# Patient Record
Sex: Male | Born: 1975
Health system: Southern US, Community
[De-identification: ages and names within clinical notes are randomized; demographics above are authoritative.]

## PROBLEM LIST (undated history)

## (undated) DIAGNOSIS — Z87898 Personal history of other specified conditions: Secondary | ICD-10-CM

## (undated) DIAGNOSIS — K219 Gastro-esophageal reflux disease without esophagitis: Secondary | ICD-10-CM

## (undated) DIAGNOSIS — Z86711 Personal history of pulmonary embolism: Secondary | ICD-10-CM

## (undated) DIAGNOSIS — E785 Hyperlipidemia, unspecified: Secondary | ICD-10-CM

## (undated) DIAGNOSIS — I509 Heart failure, unspecified: Secondary | ICD-10-CM

## (undated) DIAGNOSIS — G4730 Sleep apnea, unspecified: Secondary | ICD-10-CM

## (undated) DIAGNOSIS — F419 Anxiety disorder, unspecified: Secondary | ICD-10-CM

## (undated) DIAGNOSIS — I10 Essential (primary) hypertension: Secondary | ICD-10-CM

## (undated) DIAGNOSIS — J45909 Unspecified asthma, uncomplicated: Secondary | ICD-10-CM

## (undated) DIAGNOSIS — IMO0001 Reserved for inherently not codable concepts without codable children: Secondary | ICD-10-CM

## (undated) DIAGNOSIS — D6851 Activated protein C resistance: Secondary | ICD-10-CM

## (undated) DIAGNOSIS — M1990 Unspecified osteoarthritis, unspecified site: Secondary | ICD-10-CM

## (undated) DIAGNOSIS — S83289A Other tear of lateral meniscus, current injury, unspecified knee, initial encounter: Secondary | ICD-10-CM

## (undated) HISTORY — PX: HAND SURGERY: SHX662

## (undated) HISTORY — DX: Sleep apnea, unspecified: G47.30

## (undated) HISTORY — PX: ORIF FEMUR FRACTURE: SHX2119

## (undated) HISTORY — DX: Unspecified osteoarthritis, unspecified site: M19.90

## (undated) HISTORY — DX: Anxiety disorder, unspecified: F41.9

## (undated) HISTORY — DX: Hyperlipidemia, unspecified: E78.5

---

## 1998-06-05 ENCOUNTER — Encounter: Payer: Self-pay | Admitting: Emergency Medicine

## 1998-06-05 ENCOUNTER — Emergency Department (HOSPITAL_COMMUNITY): Admission: EM | Admit: 1998-06-05 | Discharge: 1998-06-05 | Payer: Self-pay | Admitting: Emergency Medicine

## 1998-11-04 ENCOUNTER — Encounter: Payer: Self-pay | Admitting: Emergency Medicine

## 1998-11-04 ENCOUNTER — Emergency Department (HOSPITAL_COMMUNITY): Admission: EM | Admit: 1998-11-04 | Discharge: 1998-11-04 | Payer: Self-pay | Admitting: Emergency Medicine

## 1998-11-21 ENCOUNTER — Encounter: Payer: Self-pay | Admitting: Emergency Medicine

## 1998-11-21 ENCOUNTER — Inpatient Hospital Stay (HOSPITAL_COMMUNITY): Admission: EM | Admit: 1998-11-21 | Discharge: 1998-11-28 | Payer: Self-pay | Admitting: Emergency Medicine

## 1998-11-22 ENCOUNTER — Encounter: Payer: Self-pay | Admitting: Orthopedic Surgery

## 1998-11-30 ENCOUNTER — Emergency Department (HOSPITAL_COMMUNITY): Admission: EM | Admit: 1998-11-30 | Discharge: 1998-11-30 | Payer: Self-pay | Admitting: Emergency Medicine

## 1999-04-22 ENCOUNTER — Encounter: Payer: Self-pay | Admitting: Emergency Medicine

## 1999-04-22 ENCOUNTER — Emergency Department (HOSPITAL_COMMUNITY): Admission: EM | Admit: 1999-04-22 | Discharge: 1999-04-22 | Payer: Self-pay | Admitting: Emergency Medicine

## 2000-03-13 ENCOUNTER — Emergency Department (HOSPITAL_COMMUNITY): Admission: EM | Admit: 2000-03-13 | Discharge: 2000-03-13 | Payer: Self-pay | Admitting: Emergency Medicine

## 2000-03-13 ENCOUNTER — Encounter: Payer: Self-pay | Admitting: Emergency Medicine

## 2004-11-23 ENCOUNTER — Inpatient Hospital Stay (HOSPITAL_COMMUNITY): Admission: AC | Admit: 2004-11-23 | Discharge: 2004-11-27 | Payer: Self-pay

## 2004-12-19 ENCOUNTER — Emergency Department (HOSPITAL_COMMUNITY): Admission: EM | Admit: 2004-12-19 | Discharge: 2004-12-19 | Payer: Self-pay | Admitting: Emergency Medicine

## 2006-03-30 ENCOUNTER — Emergency Department: Payer: Self-pay | Admitting: Emergency Medicine

## 2009-03-25 ENCOUNTER — Emergency Department (HOSPITAL_COMMUNITY): Admission: EM | Admit: 2009-03-25 | Discharge: 2009-03-26 | Payer: Self-pay | Admitting: Emergency Medicine

## 2010-05-18 ENCOUNTER — Emergency Department (HOSPITAL_COMMUNITY): Admission: EM | Admit: 2010-05-18 | Discharge: 2010-05-18 | Payer: Self-pay | Admitting: Emergency Medicine

## 2010-10-29 LAB — GLUCOSE, CAPILLARY
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
## 2011-11-27 ENCOUNTER — Emergency Department (HOSPITAL_COMMUNITY): Payer: Self-pay

## 2011-11-27 ENCOUNTER — Encounter (HOSPITAL_COMMUNITY): Payer: Self-pay | Admitting: *Deleted

## 2011-11-27 ENCOUNTER — Inpatient Hospital Stay (HOSPITAL_COMMUNITY)
Admission: EM | Admit: 2011-11-27 | Discharge: 2011-11-29 | DRG: 176 | Disposition: A | Discharge status: Home / Self Care | Payer: Self-pay | Attending: Internal Medicine | Admitting: Internal Medicine

## 2011-11-27 DIAGNOSIS — Z720 Tobacco use: Secondary | ICD-10-CM | POA: Diagnosis present

## 2011-11-27 DIAGNOSIS — F1010 Alcohol abuse, uncomplicated: Secondary | ICD-10-CM | POA: Diagnosis present

## 2011-11-27 DIAGNOSIS — E669 Obesity, unspecified: Secondary | ICD-10-CM | POA: Diagnosis present

## 2011-11-27 DIAGNOSIS — F17200 Nicotine dependence, unspecified, uncomplicated: Secondary | ICD-10-CM | POA: Diagnosis present

## 2011-11-27 DIAGNOSIS — I2699 Other pulmonary embolism without acute cor pulmonale: Principal | ICD-10-CM | POA: Diagnosis present

## 2011-11-27 LAB — HEMOGLOBIN A1C
Hgb A1c MFr Bld: 5.7 % — ABNORMAL HIGH (ref <5.7)
Mean Plasma Glucose: 117 mg/dL — ABNORMAL HIGH (ref <117)

## 2011-11-27 LAB — HEPATIC FUNCTION PANEL
Alkaline Phosphatase: 75 U/L (ref 39–117)
Indirect Bilirubin: 0.3 mg/dL (ref 0.3–0.9)
Total Protein: 7.6 g/dL (ref 6.0–8.3)

## 2011-11-27 LAB — APTT: aPTT: 30 seconds (ref 24–37)

## 2011-11-27 LAB — POCT I-STAT, CHEM 8
BUN: 17 mg/dL (ref 6–23)
Chloride: 108 mEq/L (ref 96–112)
Creatinine, Ser: 1.5 mg/dL — ABNORMAL HIGH (ref 0.50–1.35)
Potassium: 4 mEq/L (ref 3.5–5.1)
Sodium: 141 mEq/L (ref 135–145)

## 2011-11-27 LAB — DIFFERENTIAL
Eosinophils Relative: 5 % (ref 0–5)
Lymphocytes Relative: 23 % (ref 12–46)
Monocytes Absolute: 1 10*3/uL (ref 0.1–1.0)
Monocytes Relative: 10 % (ref 3–12)
Neutro Abs: 6 10*3/uL (ref 1.7–7.7)

## 2011-11-27 LAB — CBC
HCT: 46.4 % (ref 39.0–52.0)
Hemoglobin: 16.2 g/dL (ref 13.0–17.0)
MCV: 85.6 fL (ref 78.0–100.0)
RDW: 13.1 % (ref 11.5–15.5)
WBC: 9.8 10*3/uL (ref 4.0–10.5)

## 2011-11-27 LAB — CARDIAC PANEL(CRET KIN+CKTOT+MB+TROPI)
CK, MB: 1.9 ng/mL (ref 0.3–4.0)
CK, MB: 1.6 ng/mL (ref 0.3–4.0)
Total CK: 235 U/L — ABNORMAL HIGH (ref 7–232)

## 2011-11-27 MED ORDER — LORAZEPAM 1 MG PO TABS
1.0000 mg | ORAL_TABLET | Freq: Four times a day (QID) | ORAL | Status: DC | PRN
Start: 2011-11-27 — End: 2011-11-29
  Administered 2011-11-29: 1 mg via ORAL
  Filled 2011-11-27: qty 1

## 2011-11-27 MED ORDER — HYDROMORPHONE HCL PF 1 MG/ML IJ SOLN
0.5000 mg | Freq: Once | INTRAMUSCULAR | Status: AC
  Administered 2011-11-27: 0.5 mg via INTRAVENOUS
  Filled 2011-11-27: qty 1

## 2011-11-27 MED ORDER — HYDROMORPHONE HCL PF 1 MG/ML IJ SOLN
1.0000 mg | Freq: Once | INTRAMUSCULAR | Status: AC
  Administered 2011-11-27: 1 mg via INTRAMUSCULAR
  Filled 2011-11-27: qty 1

## 2011-11-27 MED ORDER — ACETAMINOPHEN 325 MG PO TABS: 650.0000 mg | ORAL_TABLET | Freq: Four times a day (QID) | ORAL | Status: DC | PRN

## 2011-11-27 MED ORDER — WARFARIN VIDEO
Freq: Once | Status: AC
  Administered 2011-11-27: 15:00:00

## 2011-11-27 MED ORDER — ENOXAPARIN SODIUM 150 MG/ML ~~LOC~~ SOLN
150.0000 mg | Freq: Two times a day (BID) | SUBCUTANEOUS | Status: DC
Start: 2011-11-27 — End: 2011-11-29
  Administered 2011-11-27 – 2011-11-29 (×5): 150 mg via SUBCUTANEOUS
  Filled 2011-11-27 (×8): qty 1

## 2011-11-27 MED ORDER — WARFARIN - PHARMACIST DOSING INPATIENT
Freq: Every day | Status: DC
  Administered 2011-11-27 – 2011-11-28 (×2)

## 2011-11-27 MED ORDER — COUMADIN BOOK
Freq: Once | Status: AC
  Administered 2011-11-27: 15:00:00
  Filled 2011-11-27: qty 1

## 2011-11-27 MED ORDER — NICOTINE 21 MG/24HR TD PT24
21.0000 mg | MEDICATED_PATCH | Freq: Every day | TRANSDERMAL | Status: DC
  Administered 2011-11-27 – 2011-11-29 (×4): 21 mg via TRANSDERMAL
  Filled 2011-11-27 (×4): qty 1

## 2011-11-27 MED ORDER — WARFARIN SODIUM 10 MG PO TABS
10.0000 mg | ORAL_TABLET | Freq: Once | ORAL | Status: AC
  Administered 2011-11-27: 10 mg via ORAL
  Filled 2011-11-27: qty 1

## 2011-11-27 MED ORDER — SODIUM CHLORIDE 0.9 % IV SOLN
INTRAVENOUS | Status: DC
  Administered 2011-11-27: 14:00:00 via INTRAVENOUS
  Administered 2011-11-27: 125 mL/h via INTRAVENOUS
  Administered 2011-11-28: 13:00:00 via INTRAVENOUS
  Administered 2011-11-28: 125 mL/h via INTRAVENOUS
  Administered 2011-11-29: 05:00:00 via INTRAVENOUS

## 2011-11-27 MED ORDER — MORPHINE SULFATE 2 MG/ML IJ SOLN
2.0000 mg | INTRAMUSCULAR | Status: DC | PRN
  Administered 2011-11-27: 2 mg via INTRAVENOUS
  Administered 2011-11-27 – 2011-11-28 (×4): 4 mg via INTRAVENOUS
  Filled 2011-11-27 (×4): qty 2
  Filled 2011-11-27: qty 1

## 2011-11-27 MED ORDER — FOLIC ACID 1 MG PO TABS
1.0000 mg | ORAL_TABLET | Freq: Every day | ORAL | Status: DC
  Administered 2011-11-27 – 2011-11-29 (×3): 1 mg via ORAL
  Filled 2011-11-27 (×3): qty 1

## 2011-11-27 MED ORDER — THIAMINE HCL 100 MG/ML IJ SOLN
100.0000 mg | Freq: Every day | INTRAMUSCULAR | Status: DC
  Filled 2011-11-27 (×3): qty 1

## 2011-11-27 MED ORDER — ACETAMINOPHEN 650 MG RE SUPP: 650.0000 mg | Freq: Four times a day (QID) | RECTAL | Status: DC | PRN

## 2011-11-27 MED ORDER — IOHEXOL 350 MG/ML SOLN
100.0000 mL | Freq: Once | INTRAVENOUS | Status: AC | PRN
  Administered 2011-11-27: 100 mL via INTRAVENOUS

## 2011-11-27 MED ORDER — ONDANSETRON HCL 4 MG/2ML IJ SOLN: 4.0000 mg | Freq: Four times a day (QID) | INTRAMUSCULAR | Status: DC | PRN

## 2011-11-27 MED ORDER — ADULT MULTIVITAMIN W/MINERALS CH
1.0000 | ORAL_TABLET | Freq: Every day | ORAL | Status: DC
  Administered 2011-11-27 – 2011-11-29 (×3): 1 via ORAL
  Filled 2011-11-27 (×3): qty 1

## 2011-11-27 MED ORDER — VITAMIN B-1 100 MG PO TABS
100.0000 mg | ORAL_TABLET | Freq: Every day | ORAL | Status: DC
  Administered 2011-11-27 – 2011-11-29 (×3): 100 mg via ORAL
  Filled 2011-11-27 (×3): qty 1

## 2011-11-27 MED ORDER — ONDANSETRON HCL 4 MG PO TABS: 4.0000 mg | ORAL_TABLET | Freq: Four times a day (QID) | ORAL | Status: DC | PRN

## 2011-11-27 MED ORDER — ADULT MULTIVITAMIN W/MINERALS CH
1.0000 | ORAL_TABLET | Freq: Every day | ORAL | Status: DC
  Administered 2011-11-27: 1 via ORAL

## 2011-11-27 MED ORDER — LORAZEPAM 2 MG/ML IJ SOLN: 1.0000 mg | Freq: Four times a day (QID) | INTRAMUSCULAR | Status: DC | PRN

## 2011-11-27 MED ORDER — PNEUMOCOCCAL VAC POLYVALENT 25 MCG/0.5ML IJ INJ
0.5000 mL | INJECTION | INTRAMUSCULAR | Status: AC
  Administered 2011-11-28: 0.5 mL via INTRAMUSCULAR
  Filled 2011-11-27: qty 0.5

## 2011-11-27 MED ORDER — THERA M PLUS PO TABS: 1.0000 | ORAL_TABLET | Freq: Every day | ORAL | Status: DC

## 2011-11-27 NOTE — ED Provider Notes (Signed)
Medical screening examination/treatment/procedure(s) were conducted as a shared visit with non-physician practitioner(s) and myself.  I personally evaluated the patient during the encounter. Patient with mid back pain and some shortness of breath pain that is pleuritic in nature. PE study obtained and found to have blood clots. No obvious etiology for PE. Admit medicine.  Results for orders placed during the hospital encounter of 11/27/11  CBC      Component Value Range   WBC 9.8  4.0 - 10.5 (K/uL)   RBC 5.42  4.22 - 5.81 (MIL/uL)   Hemoglobin 16.2  13.0 - 17.0 (g/dL)   HCT 14.7  82.9 - 56.2 (%)   MCV 85.6  78.0 - 100.0 (fL)   MCH 29.9  26.0 - 34.0 (pg)   MCHC 34.9  30.0 - 36.0 (g/dL)   RDW 13.0  86.5 - 78.4 (%)   Platelets 330  150 - 400 (K/uL)  DIFFERENTIAL      Component Value Range   Neutrophils Relative 61  43 - 77 (%)   Neutro Abs 6.0  1.7 - 7.7 (K/uL)   Lymphocytes Relative 23  12 - 46 (%)   Lymphs Abs 2.3  0.7 - 4.0 (K/uL)   Monocytes Relative 10  3 - 12 (%)   Monocytes Absolute 1.0  0.1 - 1.0 (K/uL)   Eosinophils Relative 5  0 - 5 (%)   Eosinophils Absolute 0.4  0.0 - 0.7 (K/uL)   Basophils Relative 1  0 - 1 (%)   Basophils Absolute 0.1  0.0 - 0.1 (K/uL)  PROTIME-INR      Component Value Range   Prothrombin Time 13.5  11.6 - 15.2 (seconds)   INR 1.01  0.00 - 1.49   APTT      Component Value Range   aPTT 30  24 - 37 (seconds)  POCT I-STAT, CHEM 8      Component Value Range   Sodium 141  135 - 145 (mEq/L)   Potassium 4.0  3.5 - 5.1 (mEq/L)   Chloride 108  96 - 112 (mEq/L)   BUN 17  6 - 23 (mg/dL)   Creatinine, Ser 6.96 (*) 0.50 - 1.35 (mg/dL)   Glucose, Bld 295 (*) 70 - 99 (mg/dL)   Calcium, Ion 2.84  1.32 - 1.32 (mmol/L)   TCO2 24  0 - 100 (mmol/L)   Hemoglobin 16.3  13.0 - 17.0 (g/dL)   HCT 44.0  10.2 - 72.5 (%)  HEPATIC FUNCTION PANEL      Component Value Range   Total Protein 7.6  6.0 - 8.3 (g/dL)   Albumin 3.3 (*) 3.5 - 5.2 (g/dL)   AST 17  0 - 37 (U/L)   ALT 19  0 - 53 (U/L)   Alkaline Phosphatase 75  39 - 117 (U/L)   Total Bilirubin 0.4  0.3 - 1.2 (mg/dL)   Bilirubin, Direct 0.1  0.0 - 0.3 (mg/dL)   Indirect Bilirubin 0.3  0.3 - 0.9 (mg/dL)  HEMOGLOBIN D6U      Component Value Range   Hemoglobin A1C 5.7 (*) <5.7 (%)   Mean Plasma Glucose 117 (*) <117 (mg/dL)  CARDIAC PANEL(CRET KIN+CKTOT+MB+TROPI)      Component Value Range   Total CK 235 (*) 7 - 232 (U/L)   CK, MB 1.9  0.3 - 4.0 (ng/mL)   Troponin I <0.30  <0.30 (ng/mL)   Relative Index 0.8  0.0 - 2.5   CARDIAC PANEL(CRET KIN+CKTOT+MB+TROPI)      Component Value Range  Total CK 231  7 - 232 (U/L)   CK, MB 1.6  0.3 - 4.0 (ng/mL)   Troponin I <0.30  <0.30 (ng/mL)   Relative Index 0.7  0.0 - 2.5    Dg Chest 2 View  11/27/2011  *RADIOLOGY REPORT*  Clinical Data: Right-sided pleuritic chest pain.  CHEST - 2 VIEW  Comparison: None.  Findings: Heart size is normal.  Mild right middle lobe atelectasis or scarring is noted.  No evidence of pulmonary air space disease or edema.  No evidence of pleural effusion. No mass or lymphadenopathy identified.  IMPRESSION: Mild right middle lobe atelectasis versus scarring.  Otherwise negative.  Original Report Authenticated By: Danae Orleans, M.D.   Ct Angio Chest W/cm &/or Wo Cm  11/27/2011  *RADIOLOGY REPORT*  Clinical Data: Pleuritic chest pain.  Back pain.  CT ANGIOGRAPHY CHEST  Technique:  Multidetector CT imaging of the chest using the standard protocol during bolus administration of intravenous contrast. Multiplanar reconstructed images including MIPs were obtained and reviewed to evaluate the vascular anatomy.  Contrast: OMNIPAQUE IOHEXOL 350 MG/ML SOLN  Comparison: 11/27/2011  Findings: Although today's exam is affected by significant motion artifact, there is clearly filling defect in the pulmonary arterial tree bilaterally compatible with pulmonary embolus.  Some of this, for example in the left lower lobe, appears peripherally located and  may be more chronic chronicity based on this location.  Some is clearly central within the vessels, particularly in the right lung and especially the right lower lobe, more characteristic of acute pulmonary embolus.  Overall clot burden is moderate to large.  Small right pleural effusion noted.  Mild cardiomegaly noted.  No reversal of the normal interventricular septal contour.  No reflux of contrast medium into the IVC noted.  Small axillary lymph nodes are present bilaterally.  No overtly pathologic thoracic adenopathy noted.  A low density right adrenal mass is present.  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  We only imaged part of this lesion.  Parenchymal detail is mildly obscured by breathing motion artifact. There is some subsegmental atelectasis along the right minor fissure.  There is also mild airspace opacity in the dependent portion of the posterior basal segment right lower lobe, favoring atelectasis but conceivably representing pulmonary hemorrhage or pneumonia.  IMPRESSION:  1.  Bilateral pulmonary embolus, with acute components and possibly with chronic components, with moderate to large clot burden. 2.  Small right pleural effusion. 3.  Mild right basilar atelectasis versus less likely pulmonary hemorrhage/pneumonia.  This exam was flagged for immediate direct telephone communication.  Original Report Authenticated By: Dellia Cloud, M.D.      Sunnie Nielsen, MD 11/27/11 [phone]
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
## 2011-11-27 NOTE — ED Notes (Signed)
Patient transported to X-ray 

## 2011-11-27 NOTE — ED Notes (Signed)
Returned from ct scan 

## 2011-11-27 NOTE — ED Notes (Signed)
[facility] at the bedside. Vital signs stable. No signs of distress noted. Respirations unlabored.

## 2011-11-27 NOTE — ED Notes (Signed)
Report given to floor, pt transported on cardiac monitor. Vital signs stable. Pt updated on plan of care.

## 2011-11-27 NOTE — ED Provider Notes (Signed)
History     CSN: [id number]  Arrival date & time 11/27/11  0017   First MD Initiated Contact with Patient 11/27/11 [phone]      Chief Complaint  Patient presents with  . Back Pain    (Consider location/radiation/quality/duration/timing/severity/associated sxs/prior treatment) Patient is a 36 y.o. male presenting with back pain. The history is provided by the patient.  Back Pain  This is a new problem. The current episode started more than 2 days ago. The problem occurs constantly. The problem has been gradually worsening. The pain is associated with no known injury. The symptoms are aggravated by certain positions. The pain is worse during the night. Associated symptoms include chest pain. Pertinent negatives include no fever and no abdominal pain. Associated symptoms comments: He has had chest and mid- to upper-back pain for the past 6 days, worse when supine, worse with deep breath. He denies specific shortness of breath. No fever. He has had a cough, "but I'm a smoker". No N, V. He reports traveling 9 days ago by 3- hour drive to the beach, otherwise, no traveling long distance. He denies leg pain. No history of clots..    History reviewed. No pertinent past medical history.  History reviewed. No pertinent past surgical history.  No family history on file.  History  Substance Use Topics  . Smoking status: Current Everyday Smoker  . Smokeless tobacco: Not on file  . Alcohol Use: Yes      Review of Systems  Constitutional: Negative for fever and chills.  HENT: Negative.   Respiratory: Negative for shortness of breath.        Pleuritic chest pain.  Cardiovascular: Positive for chest pain.  Gastrointestinal: Negative.  Negative for nausea, vomiting and abdominal pain.  Musculoskeletal: Positive for back pain.  Skin: Negative.   Neurological: Negative.     Allergies  Ibuprofen  Home Medications   Current Outpatient Rx  Name Route Sig Dispense Refill  . THERA M PLUS PO  TABS Oral Take 1 tablet by mouth daily.    Marland Kitchen NAPROXEN SODIUM 220 MG PO TABS Oral Take 440 mg by mouth once.      BP 148/120  Pulse 66  Temp(Src) 98.2 F (36.8 C) (Oral)  Resp 15  Ht 6\' 2"  (1.88 m)  Wt 320 lb (145.151 kg)  BMI 41.09 kg/m2  SpO2 98%  Physical Exam  Constitutional: He appears well-developed and well-nourished.  HENT:  Head: Normocephalic.  Neck: Normal range of motion. Neck supple.  Cardiovascular: Normal rate and regular rhythm.        Borderline tachycardia.  Pulmonary/Chest: Effort normal. No respiratory distress. He has no wheezes. He exhibits no tenderness.       Decreased breath sounds lower left lobe.  Abdominal: Soft. Bowel sounds are normal. There is no tenderness. There is no rebound and no guarding.  Musculoskeletal: Normal range of motion. He exhibits no edema.  Neurological: He is alert. No cranial nerve deficit.  Skin: Skin is warm and dry. No rash noted.  Psychiatric: He has a normal mood and affect.    ED Course  Procedures (including critical care time)  Labs Reviewed  POCT I-STAT, CHEM 8 - Abnormal; Notable for the following:    Creatinine, Ser 1.50 (*)    Glucose, Bld 100 (*)    All other components within normal limits  CBC  DIFFERENTIAL  PROTIME-INR  APTT   Dg Chest 2 View  11/27/2011  *RADIOLOGY REPORT*  Clinical Data: Right-sided pleuritic chest  pain.  CHEST - 2 VIEW  Comparison: None.  Findings: Heart size is normal.  Mild right middle lobe atelectasis or scarring is noted.  No evidence of pulmonary air space disease or edema.  No evidence of pleural effusion. No mass or lymphadenopathy identified.  IMPRESSION: Mild right middle lobe atelectasis versus scarring.  Otherwise negative.  Original Report Authenticated By: Danae Orleans, M.D.   Ct Angio Chest W/cm &/or Wo Cm  11/27/2011  *RADIOLOGY REPORT*  Clinical Data: Pleuritic chest pain.  Back pain.  CT ANGIOGRAPHY CHEST  Technique:  Multidetector CT imaging of the chest using the  standard protocol during bolus administration of intravenous contrast. Multiplanar reconstructed images including MIPs were obtained and reviewed to evaluate the vascular anatomy.  Contrast: OMNIPAQUE IOHEXOL 350 MG/ML SOLN  Comparison: 11/27/2011  Findings: Although today's exam is affected by significant motion artifact, there is clearly filling defect in the pulmonary arterial tree bilaterally compatible with pulmonary embolus.  Some of this, for example in the left lower lobe, appears peripherally located and may be more chronic chronicity based on this location.  Some is clearly central within the vessels, particularly in the right lung and especially the right lower lobe, more characteristic of acute pulmonary embolus.  Overall clot burden is moderate to large.  Small right pleural effusion noted.  Mild cardiomegaly noted.  No reversal of the normal interventricular septal contour.  No reflux of contrast medium into the IVC noted.  Small axillary lymph nodes are present bilaterally.  No overtly pathologic thoracic adenopathy noted.  A low density right adrenal mass is present.  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  We only imaged part of this lesion.  Parenchymal detail is mildly obscured by breathing motion artifact. There is some subsegmental atelectasis along the right minor fissure.  There is also mild airspace opacity in the dependent portion of the posterior basal segment right lower lobe, favoring atelectasis but conceivably representing pulmonary hemorrhage or pneumonia.  IMPRESSION:  1.  Bilateral pulmonary embolus, with acute components and possibly with chronic components, with moderate to large clot burden. 2.  Small right pleural effusion. 3.  Mild right basilar atelectasis versus less likely pulmonary hemorrhage/pneumonia.  This exam was flagged for immediate direct telephone communication.  Original Report Authenticated  By: Dellia Cloud, M.D.     No diagnosis found. 1. Bilateral Pulmonary Embolism   MDM  CXR negative with history concerning for PE in that he has steel rods in left LE, recent travel and being a smoker. His O2 sat at 94% is suboptimal and he is borderline tachycardic. --> CT Angio showing bilateral PE. Discussed with OPC - to eval. for admission.        Rodena Medin, PA-C 11/27/11 [phone]

## 2011-11-27 NOTE — ED Notes (Signed)
PT reports pain upon inspiration feeling like stabbing when he takes a deep breathe. Pt reports he is a pack a day smoker and is worse when he lays down. He has been sleeping in recliner all week because of it and rates it 10/10 when at worse. Staying still and sitting up makes pain better.

## 2011-11-27 NOTE — ED Notes (Signed)
Back pain since this past Sunday whenever he lies down.  No known injury. No nv or diarrhea
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
## 2011-11-28 LAB — LIPID PANEL
Cholesterol: 215 mg/dL — ABNORMAL HIGH (ref 0–200)
HDL: 39 mg/dL — ABNORMAL LOW (ref >39)
Total CHOL/HDL Ratio: 5.5 RATIO
Triglycerides: 194 mg/dL — ABNORMAL HIGH (ref <150)
VLDL: 39 mg/dL (ref 0–40)

## 2011-11-28 LAB — PROTIME-INR: INR: 1.09 (ref 0.00–1.49)

## 2011-11-28 LAB — CBC
Hemoglobin: 15.8 g/dL (ref 13.0–17.0)
MCH: 29.5 pg (ref 26.0–34.0)
MCHC: 34.6 g/dL (ref 30.0–36.0)
MCV: 85.1 fL (ref 78.0–100.0)

## 2011-11-28 LAB — BASIC METABOLIC PANEL
Chloride: 105 mEq/L (ref 96–112)
GFR calc non Af Amer: 55 mL/min — ABNORMAL LOW (ref >90)
Glucose, Bld: 83 mg/dL (ref 70–99)
Potassium: 4 mEq/L (ref 3.5–5.1)
Sodium: 139 mEq/L (ref 135–145)

## 2011-11-28 LAB — CARDIAC PANEL(CRET KIN+CKTOT+MB+TROPI): Total CK: 232 U/L (ref 7–232)

## 2011-11-28 MED ORDER — MORPHINE SULFATE 4 MG/ML IJ SOLN
6.0000 mg | INTRAMUSCULAR | Status: DC | PRN
  Administered 2011-11-28 – 2011-11-29 (×3): 6 mg via INTRAVENOUS
  Filled 2011-11-28 (×3): qty 1

## 2011-11-28 MED ORDER — WARFARIN SODIUM 10 MG PO TABS
10.0000 mg | ORAL_TABLET | Freq: Once | ORAL | Status: AC
  Administered 2011-11-28: 10 mg via ORAL
  Filled 2011-11-28: qty 1

## 2011-11-28 NOTE — Progress Notes (Signed)
Subjective: No acute events overnight.  Patient notes continued back pain with deep breathing, worse when lying down, partially alleviated by morphine.  After talking with family members, patient discovered that multiple family members have had DVT vs PE's.  Objective: Vital signs in last 24 hours: Filed Vitals:   11/27/11 1749 11/27/11 2237 11/28/11 0640 11/28/11 1355  BP: 150/96 147/98 136/90 137/49  Pulse: 91 86 87 99  Temp: 98.4 F (36.9 C) 98.3 F (36.8 C) 98.4 F (36.9 C) 98.3 F (36.8 C)  TempSrc: Oral Oral  Oral  Resp: 19 19 19 19   Height:      Weight:      SpO2: 98% 100% 100% 97%   Weight change:   Intake/Output Summary (Last 24 hours) at 11/28/11 1436 Last data filed at 11/28/11 0659  Gross per 24 hour  Intake 2590.33 ml  Output   1825 ml  Net 765.33 ml   Physical Exam: Blood pressure 148/120, pulse 66, temperature 98.2 F (36.8 C), temperature source Oral, resp. rate 15, height 6\' 2"  (1.88 m), weight 320 lb (145.151 kg), SpO2 98.00%.  General: alert, cooperative, and in no apparent distress HEENT: pupils equal round and reactive to light, vision grossly intact, oropharynx clear and non-erythematous  Neck: supple, no lymphadenopathy, JVD Lungs: clear to ascultation bilaterally, normal work of respiration, mild crackles at bilateral lung bases Heart: regular rate and rhythm, no murmurs, gallops, or rubs Abdomen: soft, non-tender, non-distended, normal bowel sounds  Back: non-tender to palpation  Extremities: no cyanosis, clubbing, or edema, no LE pain/edema Neurologic: alert & oriented X3, cranial nerves II-XII intact, strength grossly intact, sensation intact to light touch  Lab Results: Basic Metabolic Panel:  Lab 11/28/11 4098 11/27/11 0842  NA 139 141  K 4.0 4.0  CL 105 108  CO2 24 --  GLUCOSE 83 100*  BUN 15 17  CREATININE 1.59* 1.50*  CALCIUM 8.7 --  MG -- --  PHOS -- --   Liver Function Tests:  Lab 11/27/11 1332  AST 17  ALT 19  ALKPHOS 75   BILITOT 0.4  PROT 7.6  ALBUMIN 3.3*   CBC:  Lab 11/28/11 0500 11/27/11 0842 11/27/11 0828  WBC 9.2 -- 9.8  NEUTROABS -- -- 6.0  HGB 15.8 16.3 --  HCT 45.6 48.0 --  MCV 85.1 -- 85.6  PLT 358 -- 330   Cardiac Enzymes:  Lab 11/28/11 0522 11/27/11 1954 11/27/11 1331  CKTOTAL 232 231 235*  CKMB 1.7 1.6 1.9  CKMBINDEX -- -- --  TROPONINI <0.30 <0.30 <0.30   Hemoglobin A1C:  Lab 11/27/11 1332  HGBA1C 5.7*   Fasting Lipid Panel:  Lab 11/28/11 0500  CHOL 215*  HDL 39*  LDLCALC 137*  TRIG 194*  CHOLHDL 5.5  LDLDIRECT --   Coagulation:  Lab 11/28/11 0500 11/27/11 0828  LABPROT 14.3 13.5  INR 1.09 1.01   Studies/Results: Dg Chest 2 View  11/27/2011  *RADIOLOGY REPORT*  Clinical Data: Right-sided pleuritic chest pain.  CHEST - 2 VIEW  Comparison: None.  Findings: Heart size is normal.  Mild right middle lobe atelectasis or scarring is noted.  No evidence of pulmonary air space disease or edema.  No evidence of pleural effusion. No mass or lymphadenopathy identified.  IMPRESSION: Mild right middle lobe atelectasis versus scarring.  Otherwise negative.  Original Report Authenticated By: Danae Orleans, M.D.   Ct Angio Chest W/cm &/or Wo Cm  11/27/2011  *RADIOLOGY REPORT*  Clinical Data: Pleuritic chest pain.  Back pain.  CT ANGIOGRAPHY CHEST  Technique:  Multidetector CT imaging of the chest using the standard protocol during bolus administration of intravenous contrast. Multiplanar reconstructed images including MIPs were obtained and reviewed to evaluate the vascular anatomy.  Contrast: OMNIPAQUE IOHEXOL 350 MG/ML SOLN  Comparison: 11/27/2011  Findings: Although today's exam is affected by significant motion artifact, there is clearly filling defect in the pulmonary arterial tree bilaterally compatible with pulmonary embolus.  Some of this, for example in the left lower lobe, appears peripherally located and may be more chronic chronicity based on this location.  Some is  clearly central within the vessels, particularly in the right lung and especially the right lower lobe, more characteristic of acute pulmonary embolus.  Overall clot burden is moderate to large.  Small right pleural effusion noted.  Mild cardiomegaly noted.  No reversal of the normal interventricular septal contour.  No reflux of contrast medium into the IVC noted.  Small axillary lymph nodes are present bilaterally.  No overtly pathologic thoracic adenopathy noted.  A low density right adrenal mass is present.  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  We only imaged part of this lesion.  Parenchymal detail is mildly obscured by breathing motion artifact. There is some subsegmental atelectasis along the right minor fissure.  There is also mild airspace opacity in the dependent portion of the posterior basal segment right lower lobe, favoring atelectasis but conceivably representing pulmonary hemorrhage or pneumonia.  IMPRESSION:  1.  Bilateral pulmonary embolus, with acute components and possibly with chronic components, with moderate to large clot burden. 2.  Small right pleural effusion. 3.  Mild right basilar atelectasis versus less likely pulmonary hemorrhage/pneumonia.  This exam was flagged for immediate direct telephone communication.  Original Report Authenticated By: Dellia Cloud, M.D.   Medications: I have reviewed the patient's current medications. Scheduled Meds:   . coumadin book   Does not apply Once  . enoxaparin (LOVENOX) injection  150 mg Subcutaneous Q12H  . folic acid  1 mg Oral Daily  . mulitivitamin with minerals  1 tablet Oral Daily  . nicotine  21 mg Transdermal Daily  . pneumococcal 23 valent vaccine  0.5 mL Intramuscular Tomorrow-1000  . thiamine  100 mg Oral Daily   Or  . thiamine  100 mg Intravenous Daily  . warfarin  10 mg Oral ONCE-1800  . warfarin  10 mg Oral ONCE-1800  . warfarin   Does not  apply Once  . Warfarin - Pharmacist Dosing Inpatient   Does not apply q1800   Continuous Infusions:   . sodium chloride 125 mL/hr at 11/28/11 1230   PRN Meds:.acetaminophen, acetaminophen, LORazepam, LORazepam, morphine, ondansetron (ZOFRAN) IV, ondansetron, DISCONTD: morphine  Assessment/Plan: The patient is a 36 yo man, history of tobacco abuse, presenting after a 3-hr car ride with bilateral PE's.   # Pulmonary Embolus - bilateral PE with moderate-large clot burden, with risk factors of tob abuse and immobility. Strong family history of clots. -lovenox  -warfarin  -encourage smoking cessation   # Tob abuse - 21-yr history of tobacco abuse, states that he wants to quit  -nicotine patch  -tobacco cessation counseling   # Alcohol abuse - drinks 4 drinks/day, no history of DT's or withdrawal symptoms, last drink 1 day PTA.  -CIWA protocol  -thiamine, folate, MVI   # Elevated creatinine - likely secondary to large muscle mass and daily weight lifting activities. Appropriate for patient's  weight, likely does not represent AKI.   # Reported history of borderline DM - patient reports being told this at a previous urgent care visit, prompting his 60-lb weight loss. Hb A1C = 5.7, indicating no DM.  # Prophy - lovenox   # Dispo - will teach patient to use lovenox and begin coumadin, then likely discharge patient with lovenox to complete bridge to coumadin in 1-2 days.    LOS: 1 day   Janalyn Harder 11/28/2011, 2:36 PM

## 2011-11-28 NOTE — Progress Notes (Signed)
ANTICOAGULATION CONSULT NOTE - Follow Up Consult  Pharmacy Consult for Coumadin Indication: pulmonary embolus  Allergies  Allergen Reactions  . Bee Venom Anaphylaxis  . Honey Anaphylaxis  . Shellfish Allergy Shortness Of Breath and Swelling    Patient does not eat shellfish, lips swell, throat begins to feel tight  . Ibuprofen Hives    Patient Measurements: Height: 6\' 2"  (188 cm) Weight: 320 lb (145.151 kg) IBW/kg (Calculated) : 82.2  Heparin Dosing Weight:    Vital Signs: Temp: 98.4 F (36.9 C) (04/14 0640) Temp src: Oral (04/13 2237) BP: 136/90 mmHg (04/14 0640) Pulse Rate: 87  (04/14 0640)  Labs:  Basename 11/28/11 0522 11/28/11 0500 11/27/11 1954 11/27/11 1331 11/27/11 0842 11/27/11 0828  HGB -- 15.8 -- -- 16.3 --  HCT -- 45.6 -- -- 48.0 46.4  PLT -- 358 -- -- -- 330  APTT -- -- -- -- -- 30  LABPROT -- 14.3 -- -- -- 13.5  INR -- 1.09 -- -- -- 1.01  HEPARINUNFRC -- -- -- -- -- --  CREATININE -- 1.59* -- -- 1.50* --  CKTOTAL 232 -- 231 235* -- --  CKMB 1.7 -- 1.6 1.9 -- --  TROPONINI <0.30 -- <0.30 <0.30 -- --   Estimated Creatinine Clearance: 99.4 ml/min (by C-G formula based on Cr of 1.59).  Assessment: 36 y/o morbidly obese male c/o pain upon inspiration and lying down. Patient has steel rods in LLE, recent travel (3 hrs car ride) and +tobacco. Found to have bilateral PEs with mod-large clot burden. Scr elevated at 1.59. INR 1.09 with stable CBC this am.  Goal of Therapy:  INR 2-3   Plan:  Lovenox 150mg  sq q12h for 5 days and/or until INR>2. Coumadin 10mg  po x 1 again today.  Misty Stanley [place]ingham 11/28/2011,8:42 AM
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
## 2011-11-29 LAB — BASIC METABOLIC PANEL
BUN: 13 mg/dL (ref 6–23)
Calcium: 8.7 mg/dL (ref 8.4–10.5)
Creatinine, Ser: 1.38 mg/dL — ABNORMAL HIGH (ref 0.50–1.35)
GFR calc Af Amer: 76 mL/min — ABNORMAL LOW (ref >90)
GFR calc non Af Amer: 65 mL/min — ABNORMAL LOW (ref >90)
Glucose, Bld: 95 mg/dL (ref 70–99)

## 2011-11-29 LAB — CBC
HCT: 44.2 % (ref 39.0–52.0)
MCH: 29.4 pg (ref 26.0–34.0)
MCHC: 34.8 g/dL (ref 30.0–36.0)
MCV: 84.5 fL (ref 78.0–100.0)
RDW: 12.7 % (ref 11.5–15.5)

## 2011-11-29 LAB — PROTIME-INR: INR: 1.07 (ref 0.00–1.49)

## 2011-11-29 MED ORDER — WARFARIN SODIUM 5 MG PO TABS: 10.0000 mg | ORAL_TABLET | Freq: Every day | ORAL | Status: DC

## 2011-11-29 MED ORDER — NICOTINE 21 MG/24HR TD PT24: 1.0000 | MEDICATED_PATCH | Freq: Every day | TRANSDERMAL | Status: AC

## 2011-11-29 MED ORDER — ENOXAPARIN SODIUM 150 MG/ML ~~LOC~~ SOLN: 150.0000 mg | Freq: Two times a day (BID) | SUBCUTANEOUS | Status: DC

## 2011-11-29 MED ORDER — WARFARIN SODIUM 7.5 MG PO TABS
15.0000 mg | ORAL_TABLET | Freq: Once | ORAL | Status: DC
  Filled 2011-11-29: qty 2

## 2011-11-29 MED ORDER — LORAZEPAM 1 MG PO TABS: 1.0000 mg | ORAL_TABLET | Freq: Every evening | ORAL | Status: DC | PRN

## 2011-11-29 MED ORDER — ENOXAPARIN (LOVENOX) PATIENT EDUCATION KIT
PACK | Freq: Once | Status: DC
  Filled 2011-11-29: qty 1

## 2011-11-29 MED ORDER — MORPHINE SULFATE 4 MG/ML IJ SOLN
8.0000 mg | INTRAMUSCULAR | Status: DC | PRN
  Administered 2011-11-29: 8 mg via INTRAVENOUS
  Filled 2011-11-29: qty 2

## 2011-11-29 MED ORDER — HYDROCODONE-ACETAMINOPHEN 10-325 MG PO TABS: 1.0000 | ORAL_TABLET | ORAL | Status: DC | PRN

## 2011-11-29 MED ORDER — HYDROCODONE-ACETAMINOPHEN 10-325 MG PO TABS: 2.0000 | ORAL_TABLET | ORAL | Status: DC | PRN

## 2011-11-29 NOTE — Progress Notes (Signed)
Subjective: No acute events overnight.  Patient continues to note back pain with inspiration, partially relieved by morphine, after increasing dosage yesterday.  INR has not significantly improved.  Objective: Vital signs in last 24 hours: Filed Vitals:   11/28/11 0640 11/28/11 1355 11/28/11 2305 11/29/11 0609  BP: 136/90 137/49 164/98 119/98  Pulse: 87 99 75 83  Temp: 98.4 F (36.9 C) 98.3 F (36.8 C) 97.5 F (36.4 C) 98.4 F (36.9 C)  TempSrc:  Oral Oral   Resp: 19 19 20 21   Height:      Weight:      SpO2: 100% 97% 97% 98%   Weight change:   Intake/Output Summary (Last 24 hours) at 11/29/11 0841 Last data filed at 11/29/11 0610  Gross per 24 hour  Intake 1362.08 ml  Output   3300 ml  Net -1937.92 ml   Physical Exam: Blood pressure 148/120, pulse 66, temperature 98.2 F (36.8 C), temperature source Oral, resp. rate 15, height 6\' 2"  (1.88 m), weight 320 lb (145.151 kg), SpO2 98.00%.  General: alert, cooperative, and in no apparent distress HEENT: pupils equal round and reactive to light, vision grossly intact, oropharynx clear and non-erythematous  Neck: supple, no lymphadenopathy, JVD Lungs: clear to ascultation bilaterally, normal work of respiration, mild crackles at bilateral lung bases Heart: regular rate and rhythm, no murmurs, gallops, or rubs Abdomen: soft, non-tender, non-distended, normal bowel sounds  Back: non-tender to palpation  Extremities: no cyanosis, clubbing, or edema, no LE pain/edema Neurologic: alert & oriented X3, cranial nerves II-XII intact, strength grossly intact, sensation intact to light touch  Lab Results: Basic Metabolic Panel:  Lab 11/29/11 3086 11/28/11 0500  NA 138 139  K 3.6 4.0  CL 107 105  CO2 20 24  GLUCOSE 95 83  BUN 13 15  CREATININE 1.38* 1.59*  CALCIUM 8.7 8.7  MG -- --  PHOS -- --   Liver Function Tests:  Lab 11/27/11 1332  AST 17  ALT 19  ALKPHOS 75  BILITOT 0.4  PROT 7.6  ALBUMIN 3.3*   CBC:  Lab  11/29/11 0615 11/28/11 0500 11/27/11 0828  WBC 7.6 9.2 --  NEUTROABS -- -- 6.0  HGB 15.4 15.8 --  HCT 44.2 45.6 --  MCV 84.5 85.1 --  PLT 334 358 --   Cardiac Enzymes:  Lab 11/28/11 0522 11/27/11 1954 11/27/11 1331  CKTOTAL 232 231 235*  CKMB 1.7 1.6 1.9  CKMBINDEX -- -- --  TROPONINI <0.30 <0.30 <0.30   Hemoglobin A1C:  Lab 11/27/11 1332  HGBA1C 5.7*   Fasting Lipid Panel:  Lab 11/28/11 0500  CHOL 215*  HDL 39*  LDLCALC 137*  TRIG 194*  CHOLHDL 5.5  LDLDIRECT --   Coagulation:  Lab 11/29/11 0615 11/28/11 0500 11/27/11 0828  LABPROT 14.1 14.3 13.5  INR 1.07 1.09 1.01   Studies/Results: No results found. Medications: I have reviewed the patient's current medications. Scheduled Meds:    . enoxaparin (LOVENOX) injection  150 mg Subcutaneous Q12H  . folic acid  1 mg Oral Daily  . mulitivitamin with minerals  1 tablet Oral Daily  . nicotine  21 mg Transdermal Daily  . pneumococcal 23 valent vaccine  0.5 mL Intramuscular Tomorrow-1000  . thiamine  100 mg Oral Daily   Or  . thiamine  100 mg Intravenous Daily  . warfarin  10 mg Oral ONCE-1800  . Warfarin - Pharmacist Dosing Inpatient   Does not apply q1800   Continuous Infusions:    . sodium  chloride 125 mL/hr at 11/29/11 0440   PRN Meds:.acetaminophen, acetaminophen, LORazepam, LORazepam, morphine, ondansetron (ZOFRAN) IV, ondansetron, DISCONTD: morphine, DISCONTD: morphine  Assessment/Plan: The patient is a 36 yo man, history of tobacco abuse, presenting after a 3-hr car ride with bilateral PE's.   # Pulmonary Embolus - bilateral PE with moderate-large clot burden, with risk factors of tob abuse and immobility. Strong family history of clots.  INR has not significantly improved despite warfarin 10 mg x2 doses.  Will discuss with Dr. Alexandria Lodge. -lovenox  -warfarin  -encourage smoking cessation   # Tob abuse - 21-yr history of tobacco abuse, states that he wants to quit  -nicotine patch  -tobacco cessation  counseling   # Alcohol abuse - drinks 4 drinks/day, no history of DT's or withdrawal symptoms, last drink 1 day PTA.  -CIWA protocol  -thiamine, folate, MVI   # Elevated creatinine - likely secondary to large muscle mass and daily weight lifting activities. Appropriate for patient's weight, likely does not represent AKI.   # Reported history of borderline DM - patient reports being told this at a previous urgent care visit, prompting his 60-lb weight loss. Hb A1C = 5.7, indicating no DM.  # Prophy - lovenox   # Dispo - INR has not significantly increased.  Will discuss with Dr. Alexandria Lodge, consider keeping inpatient for 1-2 more days until INR responds, or discharging with close outpatient f/u in coumadin clinic.    LOS: 2 days   Janalyn Harder 11/29/2011, 8:41 AM

## 2011-11-29 NOTE — Discharge Instructions (Signed)
Pulmonary Embolus A pulmonary (lung) embolus (PE) is a blood clot that has traveled from another place in the body to the lung. Most clots come from deep veins in the legs or pelvis. PE is a dangerous and potentially life-threatening condition that can be treated if identified. CAUSES Blood clots form in a vein for different reasons. Usually several things cause blood clots. They include:  The flow of blood slows down.   The inside of the vein is damaged in some way.   The person has a condition that makes the blood clot more easily. These conditions may include:   Older age (especially over 75 years old).   Having a history of blood clots.   Having major or lengthy surgery. Hip surgery is particularly high-risk.   Breaking a hip or leg.   Sitting or lying still for a long time.   Cancer or cancer treatment.   Having a long, thin tube (catheter) placed inside a vein during a medical procedure.   Being overweight (obese).   Pregnancy and childbirth.   Medicines with estrogen.   Smoking.   Other circulation or heart problems.  SYMPTOMS  The symptoms of a PE usually start suddenly and include:  Shortness of breath.   Coughing.   Coughing up blood or blood-tinged mucus (phlegm).   Chest pain. Pain is often worse with deep breaths.   Rapid heartbeat.  DIAGNOSIS  If a PE is suspected, your caregiver will take a medical history and carry out a physical exam. Your caregiver will check for the risk factors listed above. Tests that also may be required include:  Blood tests, including studies of the clotting properties of your blood.   Imaging tests. Ultrasound, CT, MRI, and other tests can all be used to see if you have clots in your legs or lungs. If you have a clot in your legs and have breathing or chest problems, your caregiver may conclude that you have a clot in your lungs. Further lung tests may not be needed.   An EKG can look for heart strain from blood clots in  the lungs.  PREVENTION   Exercise the legs regularly. Take a brisk 30 minute walk every day.   Maintain a weight that is appropriate for your height.   Avoid sitting or lying in bed for long periods of time without moving your legs.   Women, particularly those over the age of 35, should consider the risks and benefits of taking estrogen medicines, including birth control pills.   Do not smoke, especially if you take estrogen medicines.   Long-distance travel can increase your risk. You should exercise your legs by walking or pumping the muscles every hour.   In hospital prevention:   Your caregiver will assess your need for preventive PE care (prophylaxis) when you are admitted to the hospital. If you are having surgery, your surgeon will assess you the day of or day after surgery.   Prevention may include medical and nonmedical measures.  TREATMENT   The most common treatment for a PE is blood thinning (anticoagulant) medicine, which reduces the blood's tendency to clot. Anticoagulants can stop new blood clots from forming and old ones from growing. They cannot dissolve existing clots. Your body does this by itself over time. Anticoagulants can be given by mouth, by intravenous (IV) access, or by injection. Your caregiver will determine the best program for you.   Less commonly, clot-dissolving drugs (thrombolytics) are used to dissolve a PE. They   carry a high risk of bleeding, so they are used mainly in severe cases.   Very rarely, a blood clot in the leg needs to be removed surgically.   If you are unable to take anticoagulants, your caregiver may arrange for you to have a filter placed in a main vein in your belly (abdomen). This filter prevents clots from traveling to your lungs.  HOME CARE INSTRUCTIONS   Take all medicines prescribed by your caregiver. Follow the directions carefully.   You will most likely continue taking anticoagulants after you leave the hospital. Your  caregiver will advise you on the length of treatment (usually 3 to 6 months, sometimes for life).   Taking too much or too little of an anticoagulant is dangerous. While taking this type of medicine, you will need to have regular blood tests to be sure the dose is correct. The dose can change for many reasons. It is critically important that you take this medicine exactly as prescribed and that you have blood tests exactly as directed.   Many foods can interfere with anticoagulants. These include foods high in vitamin K, such as spinach, kale, broccoli, cabbage, collard and turnip greens, Brussels sprouts, peas, cauliflower, seaweed, parsley, beef and pork liver, green tea, and soybean oil. Your caregiver should discuss limits on these foods with you or you should arrange a visit with a dietician to answer your questions.   Many medicines can interfere with anticoagulants. You must tell your caregiver about any and all medicines you take. This includesall vitamins and supplements. Be especially cautious with aspirin and anti-inflammatory medicines. Ask your caregiver before taking these.   Anticoagulants can have side effects, mostly excessive bruising or bleeding. You will need to hold pressure over cuts for longer than usual. Avoid alcoholic drinks or consume only very small amounts while taking this medicine.   If you are taking an anticoagulant:   Wear a medical alert bracelet.   Notify your dentist or other caregivers before procedures.   Avoid contact sports.   Ask your caregiver how soon you can go back to normal activities. Not being active can lead to new clots. Ask for a list of what you should and should not do.   Exercise your lower leg muscles. This is important while traveling.   You may need to wear compression stockings. These are tight elastic stockings that apply pressure to the lower legs. This can help keep the blood in the legs from clotting.   If you are a smoker, you  should quit.   Learn as much as you can about pulmonary embolisms.  SEEK MEDICAL CARE IF:   You notice a rapid heartbeat.   You feel weaker or more tired than usual.   You feel faint.   You notice increased bruising.   Your symptoms are not getting better in the time expected.   You are having side effects of medicine.   You have an oral temperature above 102 F (38.9 C).   You discover other family members with blood clots. This may require further testing for inherited diseases or conditions.  SEEK IMMEDIATE MEDICAL CARE IF:   You have chest pain.   You have trouble breathing.   You have new or increased swelling or pain in one leg.   You cough up blood.   You notice blood in vomit, in a bowel movement, or in urine.   You have an oral temperature above 102 F (38.9 C), not controlled by medicine.    You may have another PE. A blood clot in the lungs is a medical emergency. Call your local emergency services (911 in [place] to get to the nearest hospital or clinic. Do not drive yourself. MAKE SURE YOU:   Understand these instructions.   Will watch your condition.   Will get help right away if you are not doing well or get worse.  Document Released: 07/30/2000 Document Revised: 07/22/2011 Document Reviewed: 02/03/2009 [employer] Patient Information 2012 [employer]. 
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
## 2011-11-30 NOTE — Discharge Summary (Signed)
Internal Medicine Teaching [facility] Discharge Note  Name: Donald Brady MRN: [id number] DOB: 1976-03-24 36 y.o.  Date of Admission: 11/27/2011 12:20 AM Date of Discharge: 11/30/2011 Attending Physician: Blanch Media  Discharge Diagnosis: 1. Pulmonary Embolus - first event, risk factors of long car ride and smoking, strong FH of clots 2. Tobacco abuse  Discharge Medications: Medication List  As of 11/30/2011  4:24 PM   TAKE these medications         enoxaparin 150 MG/ML injection   Commonly known as: LOVENOX   Inject 1 mL (150 mg total) into the skin every 12 (twelve) hours.      HYDROcodone-acetaminophen 10-325 MG per tablet   Commonly known as: NORCO   Take 1 tablet by mouth every 4 (four) hours as needed.      LORazepam 1 MG tablet   Commonly known as: ATIVAN   Take 1 tablet (1 mg total) by mouth at bedtime as needed for anxiety.      multivitamins ther. w/minerals Tabs   Take 1 tablet by mouth daily.      naproxen sodium 220 MG tablet   Commonly known as: ANAPROX   Take 440 mg by mouth once.      nicotine 21 mg/24hr patch   Commonly known as: NICODERM CQ - dosed in mg/24 hours   Place 1 patch onto the skin daily.      warfarin 5 MG tablet   Commonly known as: COUMADIN   Take 2 tablets (10 mg total) by mouth daily.            Disposition and follow-up:   [name] was discharged from [facility] in stable and improved condition.  The patient will follow-up on 4/18 at the Coumadin Clinic to manage his Warfarin.  The patient will also follow-up on 2/25 with Dr. Scot Brady, to establish care, and to help the patient towards his goal of smoking cessation.  Follow-up Appointments: Discharge Orders    Future Appointments: Provider: Department: Dept Phone: Center:   12/02/2011 10:30 AM [facility] [phone] [facility]   12/09/2011 2:15 PM Zollie Beckers, MD [facility] [phone] [facility]     Future Orders  Please Complete By Expires   Diet - low sodium heart healthy      Increase activity slowly      Discharge instructions      Comments:   You have been diagnosed with a Pulmonary Embolus, otherwise known as a blood clot in your lungs, which is causing your back pain.  To break up this clot, take the following steps: 1. Take Coumadin (aka Warfarin), 2 tablets every night 2. Take Lovenox, 1 injection in the morning, 1 injection in the evening, every day until told to stop 3. See Dr. Alexandria Brady in the Coumadin Clinic this Thursday (4/18) at 11:00 am   Call MD for:  severe uncontrolled pain      Call MD for:  temperature >100.4      Call MD for:  persistant nausea and vomiting         Consultations: None  Procedures Performed:  Dg Chest 2 View  11/27/2011  *RADIOLOGY REPORT*  Clinical Data: Right-sided pleuritic chest pain.  CHEST - 2 VIEW  Comparison: None.  Findings: Heart size is normal.  Mild right middle lobe atelectasis or scarring is noted.  No evidence of pulmonary air space disease or edema.  No evidence of pleural effusion. No  mass or lymphadenopathy identified.  IMPRESSION: Mild right middle lobe atelectasis versus scarring.  Otherwise negative.  Original Report Authenticated By: Danae Orleans, M.D.   Ct Angio Chest W/cm &/or Wo Cm  11/27/2011  *RADIOLOGY REPORT*  Clinical Data: Pleuritic chest pain.  Back pain.  CT ANGIOGRAPHY CHEST  Technique:  Multidetector CT imaging of the chest using the standard protocol during bolus administration of intravenous contrast. Multiplanar reconstructed images including MIPs were obtained and reviewed to evaluate the vascular anatomy.  Contrast: OMNIPAQUE IOHEXOL 350 MG/ML SOLN  Comparison: 11/27/2011  Findings: Although today's exam is affected by significant motion artifact, there is clearly filling defect in the pulmonary arterial tree bilaterally compatible with pulmonary embolus.  Some of this, for example in the left lower lobe, appears  peripherally located and may be more chronic chronicity based on this location.  Some is clearly central within the vessels, particularly in the right lung and especially the right lower lobe, more characteristic of acute pulmonary embolus.  Overall clot burden is moderate to large.  Small right pleural effusion noted.  Mild cardiomegaly noted.  No reversal of the normal interventricular septal contour.  No reflux of contrast medium into the IVC noted.  Small axillary lymph nodes are present bilaterally.  No overtly pathologic thoracic adenopathy noted.  A low density right adrenal mass is present.  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  We only imaged part of this lesion.  Parenchymal detail is mildly obscured by breathing motion artifact. There is some subsegmental atelectasis along the right minor fissure.  There is also mild airspace opacity in the dependent portion of the posterior basal segment right lower lobe, favoring atelectasis but conceivably representing pulmonary hemorrhage or pneumonia.  IMPRESSION:  1.  Bilateral pulmonary embolus, with acute components and possibly with chronic components, with moderate to large clot burden. 2.  Small right pleural effusion. 3.  Mild right basilar atelectasis versus less likely pulmonary hemorrhage/pneumonia.  This exam was flagged for immediate direct telephone communication.  Original Report Authenticated By: Dellia Cloud, M.D.    Admission HPI:  The patient is a 36 yo man, history of tobacco abuse, presenting with dyspnea and back pain. The patient was in his usual state of health 6 days ago, returning from [place] 3-[place] beach. Several hours later that night, the patient was awoken from sleep at 2am with significant mid-back pain, 10/10 in severity, non-radiating, greatly worsened by taking a deep breath, relieved by sitting up in bed and taking shallower breaths. The  pain remained constant over the last 6 days, but did not resolve, prompting him to seek medical attention. He notes no fevers, cough, sore throat, nausea/vomiting, diarrhea/constipation, or LE pain/swelling. On presentation to the ED, he was noted to have O2 sats of 94-100% on RA, but CTA showing bilateral PE.  Admission Physical Exam Blood pressure 148/120, pulse 66, temperature 98.2 F (36.8 C), temperature source Oral, resp. rate 15, height 6\' 2"  (1.88 m), weight 320 lb (145.151 kg), SpO2 98.00%.  General: alert, cooperative, and in no apparent distress HEENT: pupils equal round and reactive to light, vision grossly intact, oropharynx clear and non-erythematous  Neck: supple, no lymphadenopathy, JVD Lungs: clear to ascultation bilaterally, normal work of respiration, mild crackles at bilateral lung bases Heart: regular rate and rhythm, no murmurs, gallops, or rubs Abdomen: soft, non-tender, non-distended, normal bowel sounds  Back: non-tender to palpation  Extremities: no cyanosis, clubbing, or edema, no LE pain/edema Neurologic: alert & oriented X3, cranial nerves II-XII intact, strength grossly intact, sensation intact to light touch  Admission Labs Basic Metabolic Panel:   [facility]  11/27/11 0842   NA  141   K  4.0   CL  108   CO2  --   GLUCOSE  100*   BUN  17   CREATININE  1.50*    CBC:  Basename  11/27/11 0842  11/27/11 0828   WBC  --  9.8   NEUTROABS  --  6.0   HGB  16.3  16.2   HCT  48.0  46.4   MCV  --  85.6   PLT  --  330     Hospital Course by problem list: 1. Pulmonary Embolus - Six days PTA, the patient developed pleuritic chest pain after a long car ride.  Upon presentation to the ED, the patient was found to have bilateral pulmonary emboli, with a moderate-large clot burden.  The patient has a history of smoking, and a strong family history of blood clots (unknown deficiency).  The patient was started on lovenox and coumadin.  The patient was taught to perform  lovenox injections, and performed a lovenox injection successfully before discharge.  The patient was discharged with lovenox and coumadin, and was given appointments to follow-up in the Coumadin clinic and the Internal Medicine Resident's Clinic.  The patient remained hemodynamically stable throughout hospitalization, with normal oxygen saturations on room air.  2. Tobacco abuse - the patient has a history of tobacco abuse, and was counseled on tobacco cessation.  The patient expressed a desire to quit tobacco use, and was given information on quitting, as well as a prescription for nicotine patches.  Time spent on discharge: 45 minutes  Discharge Vitals:  BP 161/95  Pulse 82  Temp(Src) 98.7 F (37.1 C) (Oral)  Resp 20  Ht 6\' 2"  (1.88 m)  Wt 320 lb (145.151 kg)  BMI 41.09 kg/m2  SpO2 99%  Discharge Labs:  BMET    Component Value Date/Time   NA 138 11/29/2011 0615   K 3.6 11/29/2011 0615   CL 107 11/29/2011 0615   CO2 20 11/29/2011 0615   GLUCOSE 95 11/29/2011 0615   BUN 13 11/29/2011 0615   CREATININE 1.38* 11/29/2011 0615   CALCIUM 8.7 11/29/2011 0615   GFRNONAA 65* 11/29/2011 0615   GFRAA 76* 11/29/2011 0615    CBC    Component Value Date/Time   WBC 7.6 11/29/2011 0615   RBC 5.23 11/29/2011 0615   HGB 15.4 11/29/2011 0615   HCT 44.2 11/29/2011 0615   PLT 334 11/29/2011 0615   MCV 84.5 11/29/2011 0615   MCH 29.4 11/29/2011 0615   MCHC 34.8 11/29/2011 0615   RDW 12.7 11/29/2011 0615   LYMPHSABS 2.3 11/27/2011 0828   MONOABS 1.0 11/27/2011 0828   EOSABS 0.4 11/27/2011 0828   BASOSABS 0.1 11/27/2011 0828    Signed: Janalyn Harder 11/30/2011, 4:24 PM

## 2011-12-02 ENCOUNTER — Ambulatory Visit (INDEPENDENT_AMBULATORY_CARE_PROVIDER_SITE_OTHER): Payer: Self-pay | Admitting: Pharmacist

## 2011-12-02 DIAGNOSIS — Z7901 Long term (current) use of anticoagulants: Secondary | ICD-10-CM | POA: Insufficient documentation

## 2011-12-02 DIAGNOSIS — I2699 Other pulmonary embolism without acute cor pulmonale: Secondary | ICD-10-CM

## 2011-12-02 NOTE — Patient Instructions (Signed)
Patient instructed to take medications as defined in the Anti-coagulation Track section of this encounter.  Patient instructed to take today's dose. (Already has taken 10mg  (2 x 5mg  strength coumadin tablets). Patient verbalized understanding of these instructions.

## 2011-12-02 NOTE — Progress Notes (Signed)
Anti-Coagulation Progress Note  Donald Brady is a 36 y.o. male who is currently on an anti-coagulation regimen.    RECENT RESULTS: Recent results are below, the most recent result is correlated with a dose of 10 mg. per day since discharge from hospital: Lab Results  Component Value Date   INR 1.3 12/02/2011   INR 1.07 11/29/2011   INR 1.09 11/28/2011    ANTI-COAG DOSE:   Latest dosing instructions   Total Sun Mon Tue Wed Thu Fri Sat   70 12.5 mg 10 mg 10 mg 7.5 mg  15 mg 15 mg    (5 mg2.5) (5 mg2) (5 mg2) (5 mg1.5)  (5 mg3) (5 mg3)         ANTICOAG SUMMARY: Anticoagulation Episode Summary              Current INR goal 2.0-3.0 Next INR check 12/09/2011   INR from last check 1.3! (12/02/2011)     Weekly max dose (mg)  Target end date 06/02/2012   Indications Bilateral pulmonary embolism, Encounter for long-term (current) use of anticoagulants   INR check location [facility] Preferred lab    Send INR reminders to    Comments             ANTICOAG TODAY: Anticoagulation Summary as of 12/02/2011              INR goal 2.0-3.0     Selected INR 1.3! (12/02/2011) Next INR check 12/09/2011   Weekly max dose (mg)  Target end date 06/02/2012   Indications Bilateral pulmonary embolism, Encounter for long-term (current) use of anticoagulants    Anticoagulation Episode Summary              INR check location [facility] Preferred lab    Send INR reminders to    Comments             PATIENT INSTRUCTIONS: Patient Instructions  Patient instructed to take medications as defined in the Anti-coagulation Track section of this encounter.  Patient instructed to take today's dose. (Already has taken 10mg  (2 x 5mg  strength coumadin tablets). Patient verbalized understanding of these instructions.        FOLLOW-UP Return in 7 days (on 12/09/2011) for Follow up INR.  Hulen Luster, III Pharm.D., CACP

## 2011-12-09 ENCOUNTER — Ambulatory Visit: Payer: Self-pay

## 2011-12-09 ENCOUNTER — Ambulatory Visit (INDEPENDENT_AMBULATORY_CARE_PROVIDER_SITE_OTHER): Payer: Self-pay | Admitting: Internal Medicine

## 2011-12-09 ENCOUNTER — Encounter: Payer: Self-pay | Admitting: Internal Medicine

## 2011-12-09 ENCOUNTER — Telehealth: Payer: Self-pay | Admitting: Licensed Clinical Social Worker

## 2011-12-09 VITALS — BP 115/79 | HR 82 | Temp 97.1°F | Ht 74.0 in | Wt 331.2 lb

## 2011-12-09 DIAGNOSIS — I2699 Other pulmonary embolism without acute cor pulmonale: Secondary | ICD-10-CM

## 2011-12-09 DIAGNOSIS — Z598 Other problems related to housing and economic circumstances: Secondary | ICD-10-CM

## 2011-12-09 MED ORDER — WARFARIN SODIUM 5 MG PO TABS: 10.0000 mg | ORAL_TABLET | Freq: Every day | ORAL | Status: DC

## 2011-12-09 MED ORDER — LORAZEPAM 1 MG PO TABS: 1.0000 mg | ORAL_TABLET | Freq: Every evening | ORAL | Status: AC | PRN

## 2011-12-09 MED ORDER — HYDROCODONE-ACETAMINOPHEN 10-325 MG PO TABS: 1.0000 | ORAL_TABLET | ORAL | Status: DC | PRN

## 2011-12-09 NOTE — Telephone Encounter (Signed)
Donald Brady was referred to CSW for vocational counseling resources and smoking cessation resources.  CSW attempted to follow up with pt via telephone, however, pt's phone is disconnected.  CSW will place information in the mail along with letter.  Information on: [employer] and [employer] mailed to pt's home address along with Tobacco Quit Line.

## 2011-12-09 NOTE — Patient Instructions (Signed)
Please, make an appointment with Donald Brady and Donald Brady (Social worker). Please, do not do any strenuous physical exercise unless instructed otherwise. Please, follow up with Dr. Alexandria Lodge and call with any question.
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
## 2011-12-10 ENCOUNTER — Ambulatory Visit (INDEPENDENT_AMBULATORY_CARE_PROVIDER_SITE_OTHER): Payer: Self-pay | Admitting: Pharmacist

## 2011-12-10 DIAGNOSIS — I2699 Other pulmonary embolism without acute cor pulmonale: Secondary | ICD-10-CM

## 2011-12-10 DIAGNOSIS — Z7901 Long term (current) use of anticoagulants: Secondary | ICD-10-CM

## 2011-12-10 NOTE — Progress Notes (Signed)
Anti-Coagulation Progress Note  Donald Brady is a 36 y.o. male who is currently on an anti-coagulation regimen.    RECENT RESULTS: Recent results are below, the most recent result is correlated with a dose of F-15mg Sa-15mg Su-12.5mg M-10mg T-10mg W-7.5mg Th-inr. He is being sent out on 15mg  warfarin every day until seen on Monday.   mg. per week: Lab Results  Component Value Date   INR 1.5 12/09/2011   INR 1.3 12/02/2011   INR 1.07 11/29/2011    ANTI-COAG DOSE:   Latest dosing instructions   Total Sun Mon Tue Wed Thu Fri Sat   60 15 mg    15 mg 15 mg 15 mg    (5 mg3)    (5 mg3) (5 mg3) (5 mg3)         ANTICOAG SUMMARY: Anticoagulation Episode Summary              Current INR goal 2.0-3.0 Next INR check 12/13/2011   INR from last check 1.5! (12/09/2011)     Weekly max dose (mg)  Target end date 06/02/2012   Indications Bilateral pulmonary embolism, Encounter for long-term (current) use of anticoagulants   INR check location [facility] Preferred lab    Send INR reminders to    Comments             ANTICOAG TODAY: Anticoagulation Summary as of 12/10/2011              INR goal 2.0-3.0     Selected INR 1.5! (12/09/2011) Next INR check 12/13/2011   Weekly max dose (mg)  Target end date 06/02/2012   Indications Bilateral pulmonary embolism, Encounter for long-term (current) use of anticoagulants    Anticoagulation Episode Summary              INR check location [facility] Preferred lab    Send INR reminders to    Comments             PATIENT INSTRUCTIONS: Patient Instructions  Patient instructed to take medications as defined in the Anti-coagulation Track section of this encounter.  Patient instructed to take today's dose. Continue 150mg  Lovenox injected subcutaneously every 12 hours until seen on Monday. Patient verbalized understanding of these instructions.        FOLLOW-UP Return in 3 days (on 12/13/2011) for Follow up INR.  Hulen Luster,  III Pharm.D., CACP

## 2011-12-10 NOTE — Patient Instructions (Signed)
Patient instructed to take medications as defined in the Anti-coagulation Track section of this encounter.  Patient instructed to take today's dose. Continue 150mg  Lovenox injected subcutaneously every 12 hours until seen on Monday. Patient verbalized understanding of these instructions.

## 2011-12-20 ENCOUNTER — Ambulatory Visit (INDEPENDENT_AMBULATORY_CARE_PROVIDER_SITE_OTHER): Payer: Self-pay | Admitting: Pharmacist

## 2011-12-20 DIAGNOSIS — I2699 Other pulmonary embolism without acute cor pulmonale: Secondary | ICD-10-CM

## 2011-12-20 DIAGNOSIS — Z7901 Long term (current) use of anticoagulants: Secondary | ICD-10-CM

## 2011-12-20 LAB — POCT INR: INR: 1.1

## 2011-12-20 NOTE — Patient Instructions (Signed)
Patient instructed to take medications as defined in the Anti-coagulation Track section of this encounter.  Patient instructed to take today's dose.  Patient verbalized understanding of these instructions.    

## 2011-12-20 NOTE — Progress Notes (Signed)
Anti-Coagulation Progress Note  Donald Brady is a 36 y.o. male who is currently on an anti-coagulation regimen.    RECENT RESULTS: Recent results are below, the most recent result is correlated with a dose of 15mg  each day. Lab Results  Component Value Date   INR 1.1 12/20/2011   INR 1.5 12/09/2011   INR 1.3 12/02/2011    ANTI-COAG DOSE:   Latest dosing instructions   Total Sun Mon Tue Wed Thu Fri Sat   130 15 mg 20 mg 20 mg 20 mg 20 mg 20 mg 15 mg    (5 mg3) (5 mg4) (5 mg4) (5 mg4) (5 mg4) (5 mg4) (5 mg3)         ANTICOAG SUMMARY: Anticoagulation Episode Summary              Current INR goal 2.0-3.0 Next INR check 12/27/2011   INR from last check 1.1! (12/20/2011)     Weekly max dose (mg)  Target end date 06/02/2012   Indications Bilateral pulmonary embolism, Encounter for long-term (current) use of anticoagulants   INR check location [facility] Preferred lab    Send INR reminders to    Comments             ANTICOAG TODAY: Anticoagulation Summary as of 12/20/2011              INR goal 2.0-3.0     Selected INR 1.1! (12/20/2011) Next INR check 12/27/2011   Weekly max dose (mg)  Target end date 06/02/2012   Indications Bilateral pulmonary embolism, Encounter for long-term (current) use of anticoagulants    Anticoagulation Episode Summary              INR check location [facility] Preferred lab    Send INR reminders to    Comments             PATIENT INSTRUCTIONS: Patient Instructions  Patient instructed to take medications as defined in the Anti-coagulation Track section of this encounter.  Patient instructed to take today's dose.  Patient verbalized understanding of these instructions.        FOLLOW-UP Return in 7 days (on 12/27/2011) for Follow up INR.  Hulen Luster, III Pharm.D., CACP

## 2011-12-27 ENCOUNTER — Ambulatory Visit (INDEPENDENT_AMBULATORY_CARE_PROVIDER_SITE_OTHER): Payer: Self-pay | Admitting: Pharmacist

## 2011-12-27 ENCOUNTER — Other Ambulatory Visit: Payer: Self-pay | Admitting: Internal Medicine

## 2011-12-27 DIAGNOSIS — Z7901 Long term (current) use of anticoagulants: Secondary | ICD-10-CM

## 2011-12-27 LAB — PROTIME-INR
INR: 1.13 (ref <1.50)
Prothrombin Time: 14.7 seconds (ref 11.6–15.2)

## 2011-12-28 ENCOUNTER — Telehealth: Payer: Self-pay | Admitting: Pharmacist

## 2011-12-28 NOTE — Telephone Encounter (Signed)
Patient provided additional Lovenox (150mg  syringes #10) for continued SQ q12h injections until seen next in anticoagulation management clinic.  Still seeing subtherapeutic response to warfarin after continued escalation of dose. Patient endorses full compliance to his warfarin regimen. Had venous drawn specimen to determine if the correlation between fingerstick POC might be discordant from venous sampling. They were both in agreement (the two results). In some instances patients can be very rapid metabolizers of warfarin--a genetic single nucleotide polymorphism--responsible for this "warfarin resistance". We can give consideration (and if still sub-therapeutic at next scheduled visit) to performing a clotting Factor X assay which we would normally want to see between 10 - 30% of it's normal activity--if the patient is "adequately" anticoagulated--even in the presence of a sub-therapeutic INR. Will also evaluate an anti-Xa heparin level while on Lovenox---due to body habitus--and to establish adequacy of treatment (on Lovenox) in the setting of a subtherapeutic INR.

## 2011-12-28 NOTE — Telephone Encounter (Signed)
Donald Brady's history and labs reviewed.  I agree with Dr. Saralyn Pilar assessment and plan.

## 2012-01-05 ENCOUNTER — Ambulatory Visit (INDEPENDENT_AMBULATORY_CARE_PROVIDER_SITE_OTHER): Payer: Self-pay | Admitting: Pharmacist

## 2012-01-05 DIAGNOSIS — Z7901 Long term (current) use of anticoagulants: Secondary | ICD-10-CM

## 2012-01-05 DIAGNOSIS — I2699 Other pulmonary embolism without acute cor pulmonale: Secondary | ICD-10-CM

## 2012-01-05 MED ORDER — WARFARIN SODIUM 5 MG PO TABS: ORAL_TABLET | ORAL | Status: DC

## 2012-01-05 NOTE — Progress Notes (Signed)
Anti-Coagulation Progress Note  Donald Brady is a 36 y.o. male who is currently on an anti-coagulation regimen.    RECENT RESULTS: Recent results are below, the most recent result is correlated with a dose of 150 mg. per week: Lab Results  Component Value Date   INR 2.1 01/05/2012   INR 1.13 12/27/2011   INR 1.1 12/20/2011    ANTI-COAG DOSE:   Latest dosing instructions   Total Sun Mon Tue Wed Thu Fri Sat   175 25 mg 25 mg 25 mg 25 mg 25 mg 25 mg 25 mg    (5 mg5) (5 mg5) (5 mg5) (5 mg5) (5 mg5) (5 mg5) (5 mg5)         ANTICOAG SUMMARY: Anticoagulation Episode Summary              Current INR goal 2.0-3.0 Next INR check 01/11/2012   INR from last check 2.1 (01/05/2012)     Weekly max dose (mg)  Target end date 06/02/2012   Indications Bilateral pulmonary embolism, Encounter for long-term (current) use of anticoagulants   INR check location [facility]    Send INR reminders to    Comments             ANTICOAG TODAY: Anticoagulation Summary as of 01/05/2012              INR goal 2.0-3.0     Selected INR 2.1 (01/05/2012) Next INR check 01/11/2012   Weekly max dose (mg)  Target end date 06/02/2012   Indications Bilateral pulmonary embolism, Encounter for long-term (current) use of anticoagulants    Anticoagulation Episode Summary              INR check location [facility]erred lab    Send INR reminders to    Comments             PATIENT INSTRUCTIONS: Patient Instructions  Patient instructed to take medications as defined in the Anti-coagulation Track section of this encounter.  Patient instructed to TAKE today's dose. May discontinue the Lovenox. Patient verbalized understanding of these instructions.        FOLLOW-UP Return in 6 days (on 01/11/2012) for Follow up INR at 0845AM.  Hulen Luster, III Pharm.D., CACP

## 2012-01-05 NOTE — Patient Instructions (Signed)
Patient instructed to take medications as defined in the Anti-coagulation Track section of this encounter.  Patient instructed to TAKE today's dose. May discontinue the Lovenox. Patient verbalized understanding of these instructions.

## 2012-01-11 ENCOUNTER — Ambulatory Visit (INDEPENDENT_AMBULATORY_CARE_PROVIDER_SITE_OTHER): Payer: Self-pay | Admitting: Pharmacist

## 2012-01-11 ENCOUNTER — Other Ambulatory Visit: Payer: Self-pay | Admitting: Pharmacist

## 2012-01-11 DIAGNOSIS — I2699 Other pulmonary embolism without acute cor pulmonale: Secondary | ICD-10-CM

## 2012-01-11 DIAGNOSIS — Z7901 Long term (current) use of anticoagulants: Secondary | ICD-10-CM

## 2012-01-11 LAB — POCT INR
INR: 1.6
INR: 1.6

## 2012-01-11 MED ORDER — WARFARIN SODIUM 5 MG PO TABS: ORAL_TABLET | ORAL | Status: DC

## 2012-01-11 NOTE — Patient Instructions (Signed)
Patient instructed to take medications as defined in the Anti-coagulation Track section of this encounter.  Patient instructed to TAKE today's dose.  Patient verbalized understanding of these instructions.     

## 2012-01-11 NOTE — Progress Notes (Signed)
Anti-Coagulation Progress Note  Donald Brady is a 36 y.o. male who is currently on an anti-coagulation regimen.    RECENT RESULTS: Recent results are below, the most recent result is correlated with a dose of 175 mg. per week: Lab Results  Component Value Date   INR 1.6 01/11/2012   INR 1.6 01/11/2012   INR 2.1 01/05/2012    ANTI-COAG DOSE:   Latest dosing instructions   Total Sun Mon Tue Wed Thu Fri Sat   175 25 mg 25 mg 25 mg 25 mg 25 mg 25 mg 25 mg    (5 mg5) (5 mg5) (5 mg5) (5 mg5) (5 mg5) (5 mg5) (5 mg5)         ANTICOAG SUMMARY: Anticoagulation Episode Summary              Current INR goal 2.0-3.0 Next INR check 01/24/2012   INR from last check 1.6! (01/11/2012)     Weekly max dose (mg)  Target end date 06/02/2012   Indications Bilateral pulmonary embolism, Encounter for long-term (current) use of anticoagulants   INR check location [facility]erred lab    Send INR reminders to    Comments             ANTICOAG TODAY: Anticoagulation Summary as of 01/11/2012              INR goal 2.0-3.0     Selected INR 1.6! (01/11/2012) Next INR check 01/24/2012   Weekly max dose (mg)  Target end date 06/02/2012   Indications Bilateral pulmonary embolism, Encounter for long-term (current) use of anticoagulants    Anticoagulation Episode Summary              INR check location [facility]    Send INR reminders to    Comments             PATIENT INSTRUCTIONS: Patient Instructions  Patient instructed to take medications as defined in the Anti-coagulation Track section of this encounter.  Patient instructed to TAKE today's dose.  Patient verbalized understanding of these instructions.        FOLLOW-UP Return in 2 weeks (on 01/24/2012) for Follow up INR at 0900h.  Hulen Luster, III Pharm.D., CACP

## 2012-01-12 ENCOUNTER — Ambulatory Visit (INDEPENDENT_AMBULATORY_CARE_PROVIDER_SITE_OTHER): Payer: Self-pay | Admitting: Pharmacist

## 2012-01-12 DIAGNOSIS — I2699 Other pulmonary embolism without acute cor pulmonale: Secondary | ICD-10-CM

## 2012-01-12 DIAGNOSIS — Z7901 Long term (current) use of anticoagulants: Secondary | ICD-10-CM

## 2012-01-12 NOTE — Progress Notes (Signed)
Anti-Coagulation Progress Note  Donald Brady is a 36 y.o. male who is currently on an anti-coagulation regimen.    RECENT RESULTS: Recent results are below, the most recent result is correlated with a dose of 175 mg. per week: Lab Results  Component Value Date   INR 1.7 01/12/2012   INR 1.6 01/11/2012   INR 1.6 01/11/2012    ANTI-COAG DOSE:   Latest dosing instructions   Total Glynis Smiles Tue Wed Thu Fri Sat   175 25 mg 25 mg 25 mg 25 mg 25 mg 25 mg 25 mg    (5 mg5) (5 mg5) (5 mg5) (5 mg5) (5 mg5) (5 mg5) (5 mg5)         ANTICOAG SUMMARY: Anticoagulation Episode Summary              Current INR goal 2.0-3.0 Next INR check 01/24/2012   INR from last check 1.7! (01/12/2012)     Weekly max dose (mg)  Target end date 06/02/2012   Indications Bilateral pulmonary embolism, Encounter for long-term (current) use of anticoagulants   INR check location [facility]    Send INR reminders to    Comments             ANTICOAG TODAY: Anticoagulation Summary as of 01/12/2012              INR goal 2.0-3.0     Selected INR 1.7! (01/12/2012) Next INR check 01/24/2012   Weekly max dose (mg)  Target end date 06/02/2012   Indications Bilateral pulmonary embolism, Encounter for long-term (current) use of anticoagulants    Anticoagulation Episode Summary              INR check location [facility]    Send INR reminders to    Comments             PATIENT INSTRUCTIONS: Patient Instructions  Patient instructed to take medications as defined in the Anti-coagulation Track section of this encounter.  Patient instructed to TAKE today's dose.  Patient verbalized understanding of these instructions.  Patient is to inject Lovenox 150mg  subcutaneously every 12 hours. Lovenox provided #10 syringes, Lot [id number], Exp: 8/14.      FOLLOW-UP Return in 12 days (on 01/24/2012) for Follow up INR at 0900h.  Hulen Luster, III Pharm.D., CACP

## 2012-01-12 NOTE — Patient Instructions (Signed)
Patient instructed to take medications as defined in the Anti-coagulation Track section of this encounter.  Patient instructed to TAKE today's dose.  Patient verbalized understanding of these instructions.  Patient is to inject Lovenox 150mg  subcutaneously every 12 hours. Lovenox provided #10 syringes, Lot [id number], Exp: 8/14.

## 2012-01-24 ENCOUNTER — Ambulatory Visit (INDEPENDENT_AMBULATORY_CARE_PROVIDER_SITE_OTHER): Payer: Self-pay | Admitting: Pharmacist

## 2012-01-24 DIAGNOSIS — Z7901 Long term (current) use of anticoagulants: Secondary | ICD-10-CM

## 2012-01-24 DIAGNOSIS — I2699 Other pulmonary embolism without acute cor pulmonale: Secondary | ICD-10-CM

## 2012-01-24 NOTE — Progress Notes (Signed)
Anti-Coagulation Progress Note  Donald Brady is a 36 y.o. male who is currently on an anti-coagulation regimen.    RECENT RESULTS: Recent results are below, the most recent result is correlated with a dose of 175 mg. per week: Lab Results  Component Value Date   INR 1.1 01/24/2012   INR 1.7 01/12/2012   INR 1.6 01/11/2012    ANTI-COAG DOSE:   Latest dosing instructions   Total Sun Mon Tue Wed Thu Fri Sat   175 25 mg 25 mg 25 mg 25 mg 25 mg 25 mg 25 mg    (5 mg5) (5 mg5) (5 mg5) (5 mg5) (5 mg5) (5 mg5) (5 mg5)         ANTICOAG SUMMARY: Anticoagulation Episode Summary              Current INR goal 2.0-3.0 Next INR check 01/24/2012   INR from last check 1.7! (01/12/2012) Most recent INR 1.1! (01/24/2012)   Weekly max dose (mg)  Target end date 06/02/2012   Indications Bilateral pulmonary embolism, Encounter for long-term (current) use of anticoagulants   [facility]R check location [facility]    Send INR reminders to    Comments             ANTICOAG TODAY: Anticoagulation Summary as of 01/24/2012              INR goal 2.0-3.0     Selected INR  Next INR check    Weekly max dose (mg)  Target end date 06/02/2012   Indications Bilateral pulmonary embolism, Encounter for long-term (current) use of anticoagulants    Anticoagulation Episode Summary              INR check location [facility]    Send INR reminders to    Comments             PATIENT INSTRUCTIONS: Patient Instructions  Patient instructed to take medications as defined in the Anti-coagulation Track section of this encounter.  Patient instructed to take today's dose.  Patient verbalized understanding of these instructions.        FOLLOW-UP No Follow-up on file.  Hulen Luster, III Pharm.D., CACP

## 2012-01-24 NOTE — Progress Notes (Signed)
Addended by: Hulen Luster B on: 01/24/2012 03:32 PM   Modules accepted: Orders

## 2012-01-24 NOTE — Patient Instructions (Addendum)
Patient instructed to take medications as defined in the Anti-coagulation Track section of this encounter.  Patient instructed to take today's dose.  Patient verbalized understanding of these instructions.  Patient will take 5x5mg  (25mg  warfarin) on Sun/Sat; will take 6x5mg  (30mg ) on all other days for TOTAL of 200mg /wk.

## 2012-01-27 NOTE — Progress Notes (Signed)
Agree with follow-up visit in 7 days for recheck after adjustment on dose as outlined by Dr. Alexandria Lodge.

## 2012-02-07 ENCOUNTER — Ambulatory Visit (INDEPENDENT_AMBULATORY_CARE_PROVIDER_SITE_OTHER): Payer: Self-pay | Admitting: Pharmacist

## 2012-02-07 DIAGNOSIS — Z7901 Long term (current) use of anticoagulants: Secondary | ICD-10-CM

## 2012-02-07 DIAGNOSIS — I2699 Other pulmonary embolism without acute cor pulmonale: Secondary | ICD-10-CM

## 2012-02-07 LAB — POCT INR: INR: 1.6

## 2012-02-07 NOTE — Patient Instructions (Signed)
Patient instructed to take medications as defined in the Anti-coagulation Track section of this encounter.  Patient instructed to take today's dose.  Patient verbalized understanding of these instructions.    

## 2012-02-07 NOTE — Progress Notes (Signed)
Anti-Coagulation Progress Note  Donald Brady is a 36 y.o. male who is currently on an anti-coagulation regimen.    RECENT RESULTS: Recent results are below, the most recent result is correlated with a dose of 175 mg. per week:  We are also getting a Factor X clotting factor assay to determine if there is discordance between INR response and dose response. We want the Factor X clotting assay to be between 10 - 30% of its normal activity. We will evaluate if this patient is indeed having a disparate response to his INR response to warfarin. Lab Results  Component Value Date   INR 1.60 02/07/2012   INR 1.39 02/07/2012   INR 1.1 01/24/2012    ANTI-COAG DOSE:   Latest dosing instructions   Total Sun Mon Tue Wed Thu Fri Sat   240 30 mg 35 mg 35 mg 35 mg 35 mg 35 mg 35 mg    (5 mg6) (5 mg7) (5 mg7) (5 mg7) (5 mg7) (5 mg7) (5 mg7)         ANTICOAG SUMMARY: Anticoagulation Episode Summary              Current INR goal 2.0-3.0 Next INR check 02/21/2012   INR from last check 1.60! (02/07/2012)     Weekly max dose (mg)  Target end date 06/02/2012   Indications Bilateral pulmonary embolism, Encounter for long-term (current) use of anticoagulants   INR check location [facility]    Send INR reminders to    Comments             ANTICOAG TODAY: Anticoagulation Summary as of 02/07/2012              INR goal 2.0-3.0     Selected INR 1.60! (02/07/2012) Next INR check 02/21/2012   Weekly max dose (mg)  Target end date 06/02/2012   Indications Bilateral pulmonary embolism, Encounter for long-term (current) use of anticoagulants    Anticoagulation Episode Summary              INR check location [facility]    Send INR reminders to    Comments             PATIENT INSTRUCTIONS: Patient Instructions  Patient instructed to take medications as defined in the Anti-coagulation Track section of this encounter.  Patient instructed to take today's dose.    Patient verbalized understanding of these instructions.        FOLLOW-UP Return in 2 weeks (on 02/21/2012) for Follow up INR at 0930h.  Hulen Luster, III Pharm.D., CACP

## 2012-02-21 ENCOUNTER — Ambulatory Visit (INDEPENDENT_AMBULATORY_CARE_PROVIDER_SITE_OTHER): Payer: Self-pay | Admitting: Pharmacist

## 2012-02-21 DIAGNOSIS — Z7901 Long term (current) use of anticoagulants: Secondary | ICD-10-CM

## 2012-02-21 DIAGNOSIS — I2699 Other pulmonary embolism without acute cor pulmonale: Secondary | ICD-10-CM

## 2012-02-21 LAB — POCT INR: INR: 2.4

## 2012-02-21 NOTE — Patient Instructions (Signed)
Patient instructed to take medications as defined in the Anti-coagulation Track section of this encounter.  Patient instructed to take today's dose.  Patient verbalized understanding of these instructions.    

## 2012-02-21 NOTE — Progress Notes (Signed)
Anti-Coagulation Progress Note  Donald Brady is a 36 y.o. male who is currently on an anti-coagulation regimen.    RECENT RESULTS: Recent results are below, the most recent result is correlated with a dose of 240 mg. per week: Lab Results  Component Value Date   INR 2.4 02/21/2012   INR 1.60 02/07/2012   INR 1.39 02/07/2012    ANTI-COAG DOSE:   Latest dosing instructions   Total Sun Mon Tue Wed Thu Fri Sat   245 35 mg 35 mg 35 mg 35 mg 35 mg 35 mg 35 mg    (5 mg7) (5 mg7) (5 mg7) (5 mg7) (5 mg7) (5 mg7) (5 mg7)         ANTICOAG SUMMARY: Anticoagulation Episode Summary              Current INR goal 2.0-3.0 Next INR check 03/06/2012   INR from last check 2.4 (02/21/2012)     Weekly max dose (mg)  Target end date 06/02/2012   Indications Bilateral pulmonary embolism, Encounter for long-term (current) use of anticoagulants   INR check location [facility]    Send INR reminders to    Comments             ANTICOAG TODAY: Anticoagulation Summary as of 02/21/2012              INR goal 2.0-3.0     Selected INR 2.4 (02/21/2012) Next INR check 03/06/2012   Weekly max dose (mg)  Target end date 06/02/2012   Indications Bilateral pulmonary embolism, Encounter for long-term (current) use of anticoagulants    Anticoagulation Episode Summary              INR check location [facility]    Send INR reminders to    Comments             PATIENT INSTRUCTIONS: Patient Instructions  Patient instructed to take medications as defined in the Anti-coagulation Track section of this encounter.  Patient instructed to take today's dose.  Patient verbalized understanding of these instructions.        FOLLOW-UP Return in 2 weeks (on 03/06/2012) for Follow up INR at 0945h.  Hulen Luster, III Pharm.D., CACP

## 2012-03-06 ENCOUNTER — Ambulatory Visit (INDEPENDENT_AMBULATORY_CARE_PROVIDER_SITE_OTHER): Payer: Self-pay | Admitting: Pharmacist

## 2012-03-06 DIAGNOSIS — I2699 Other pulmonary embolism without acute cor pulmonale: Secondary | ICD-10-CM

## 2012-03-06 DIAGNOSIS — Z7901 Long term (current) use of anticoagulants: Secondary | ICD-10-CM

## 2012-03-06 LAB — POCT INR: INR: 1.4

## 2012-03-06 NOTE — Patient Instructions (Signed)
Patient instructed to take medications as defined in the Anti-coagulation Track section of this encounter.  Patient instructed to take today's dose.  Patient verbalized understanding of these instructions.    

## 2012-03-06 NOTE — Progress Notes (Signed)
Anti-Coagulation Progress Note  Donald Brady is a 36 y.o. male who is currently on an anti-coagulation regimen.    RECENT RESULTS: Recent results are below, the most recent result is correlated with a dose of 245 mg. per week: Lab Results  Component Value Date   INR 1.40 03/06/2012   INR 2.4 02/21/2012   INR 1.60 02/07/2012    ANTI-COAG DOSE:   Latest dosing instructions   Total Sun Mon Tue Wed Thu Fri Sat   265 35 mg 40 mg 40 mg 40 mg 40 mg 35 mg 35 mg    (5 mg7) (5 mg8) (5 mg8) (5 mg8) (5 mg8) (5 mg7) (5 mg7)         ANTICOAG SUMMARY: Anticoagulation Episode Summary              Current INR goal 2.0-3.0 Next INR check 03/13/2012   INR from last check 1.40! (03/06/2012)     Weekly max dose (mg)  Target end date 06/02/2012   Indications Bilateral pulmonary embolism, Encounter for long-term (current) use of anticoagulants   INR check location [facility]    Send INR reminders to    Comments             ANTICOAG TODAY: Anticoagulation Summary as of 03/06/2012              INR goal 2.0-3.0     Selected INR 1.40! (03/06/2012) Next INR check 03/13/2012   Weekly max dose (mg)  Target end date 06/02/2012   Indications Bilateral pulmonary embolism, Encounter for long-term (current) use of anticoagulants    Anticoagulation Episode Summary              INR check location [facility]    Send INR reminders to    Comments             PATIENT INSTRUCTIONS: Patient Instructions  Patient instructed to take medications as defined in the Anti-coagulation Track section of this encounter.  Patient instructed to take today's dose.  Patient verbalized understanding of these instructions.        FOLLOW-UP Return in 7 days (on 03/13/2012) for Follow up INR at 0830h.  Hulen Luster, III Pharm.D., CACP

## 2012-03-13 ENCOUNTER — Ambulatory Visit (INDEPENDENT_AMBULATORY_CARE_PROVIDER_SITE_OTHER): Payer: Self-pay | Admitting: Pharmacist

## 2012-03-13 DIAGNOSIS — Z7901 Long term (current) use of anticoagulants: Secondary | ICD-10-CM

## 2012-03-13 DIAGNOSIS — I2699 Other pulmonary embolism without acute cor pulmonale: Secondary | ICD-10-CM

## 2012-03-13 LAB — POCT INR: INR: 1.9

## 2012-03-13 MED ORDER — WARFARIN SODIUM 5 MG PO TABS: ORAL_TABLET | ORAL | Status: DC

## 2012-03-13 NOTE — Progress Notes (Signed)
Anti-Coagulation Progress Note  Donald Brady is a 36 y.o. male who is currently on an anti-coagulation regimen.    RECENT RESULTS: Recent results are below, the most recent result is correlated with a dose of 265 mg. per week: Lab Results  Component Value Date   INR 1.90 03/13/2012   INR 1.40 03/06/2012   INR 2.4 02/21/2012    ANTI-COAG DOSE:   Latest dosing instructions   Total Sun Mon Tue Wed Thu Fri Sat   280 40 mg 40 mg 40 mg 40 mg 40 mg 40 mg 40 mg    (5 mg8) (5 mg8) (5 mg8) (5 mg8) (5 mg8) (5 mg8) (5 mg8)         ANTICOAG SUMMARY: Anticoagulation Episode Summary              Current INR goal 2.0-3.0 Next INR check 04/03/2012   INR from last check 1.90! (03/13/2012)     Weekly max dose (mg)  Target end date 06/02/2012   Indications Bilateral pulmonary embolism, Encounter for long-term (current) use of anticoagulants   INR check location [facility]    Send INR reminders to    Comments             ANTICOAG TODAY: Anticoagulation Summary as of 03/13/2012              INR goal 2.0-3.0     Selected INR 1.90! (03/13/2012) Next INR check 04/03/2012   Weekly max dose (mg)  Target end date 06/02/2012   Indications Bilateral pulmonary embolism, Encounter for long-term (current) use of anticoagulants    Anticoagulation Episode Summary              INR check location [facility]    Send INR reminders to    Comments             PATIENT INSTRUCTIONS: Patient Instructions  Patient instructed to take medications as defined in the Anti-coagulation Track section of this encounter.  Patient instructed to take today's dose.  Patient verbalized understanding of these instructions.        FOLLOW-UP Return in 3 weeks (on 04/03/2012) for Follow up INR at 0830h.  Hulen Luster, III Pharm.D., CACP

## 2012-03-13 NOTE — Patient Instructions (Signed)
Patient instructed to take medications as defined in the Anti-coagulation Track section of this encounter.  Patient instructed to take today's dose.  Patient verbalized understanding of these instructions.    

## 2012-03-14 NOTE — Progress Notes (Signed)
Agree with plan 

## 2012-04-10 ENCOUNTER — Encounter: Payer: Self-pay | Admitting: Internal Medicine

## 2012-04-10 ENCOUNTER — Ambulatory Visit (INDEPENDENT_AMBULATORY_CARE_PROVIDER_SITE_OTHER): Payer: Self-pay | Admitting: Internal Medicine

## 2012-04-10 ENCOUNTER — Ambulatory Visit (INDEPENDENT_AMBULATORY_CARE_PROVIDER_SITE_OTHER): Payer: Self-pay | Admitting: Pharmacist

## 2012-04-10 VITALS — BP 140/96 | HR 87 | Temp 99.3°F | Ht 74.0 in | Wt 357.8 lb

## 2012-04-10 DIAGNOSIS — R079 Chest pain, unspecified: Secondary | ICD-10-CM

## 2012-04-10 DIAGNOSIS — I2699 Other pulmonary embolism without acute cor pulmonale: Secondary | ICD-10-CM

## 2012-04-10 DIAGNOSIS — Z7901 Long term (current) use of anticoagulants: Secondary | ICD-10-CM

## 2012-04-10 MED ORDER — HYDROCODONE-ACETAMINOPHEN 5-500 MG PO TABS: 2.0000 | ORAL_TABLET | Freq: Four times a day (QID) | ORAL | Status: DC | PRN

## 2012-04-10 NOTE — Progress Notes (Signed)
Anti-Coagulation Progress Note  Donald Brady is a 36 y.o. male who is currently on an anti-coagulation regimen.    RECENT RESULTS: Recent results are below, the most recent result is correlated with a dose of 280 mg. per week: Lab Results  Component Value Date   INR 1.70 04/10/2012   INR 1.90 03/13/2012   INR 1.40 03/06/2012    ANTI-COAG DOSE:   Latest dosing instructions   Total Sun Mon Tue Wed Thu Fri Sat   300 40 mg 45 mg 45 mg 45 mg 40 mg 45 mg 40 mg    (5 mg8) (5 mg9) (5 mg9) (5 mg9) (5 mg8) (5 mg9) (5 mg8)         ANTICOAG SUMMARY: Anticoagulation Episode Summary              Current INR goal 2.0-3.0 Next INR check 04/24/2012   INR from last check 1.70! (04/10/2012)     Weekly max dose (mg)  Target end date 06/02/2012   Indications Bilateral pulmonary embolism, Encounter for long-term (current) use of anticoagulants   INR check location [facility]    Send INR reminders to    Comments             ANTICOAG TODAY: Anticoagulation Summary as of 04/10/2012              INR goal 2.0-3.0     Selected INR 1.70! (04/10/2012) Next INR check 04/24/2012   Weekly max dose (mg)  Target end date 06/02/2012   Indications Bilateral pulmonary embolism, Encounter for long-term (current) use of anticoagulants    Anticoagulation Episode Summary              INR check location [facility]    Send INR reminders to    Comments             PATIENT INSTRUCTIONS: Patient Instructions  Patient instructed to take medications as defined in the Anti-coagulation Track section of this encounter.  Patient instructed to take today's dose.  Patient verbalized understanding of these instructions.        FOLLOW-UP Return in 2 weeks (on 04/24/2012) for Follow up INR at 0830h.  Hulen Luster, III Pharm.D., CACP

## 2012-04-10 NOTE — Patient Instructions (Signed)
Patient instructed to take medications as defined in the Anti-coagulation Track section of this encounter.  Patient instructed to take today's dose.  Patient verbalized understanding of these instructions.    
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
## 2012-04-10 NOTE — Progress Notes (Signed)
Donald Brady was personally seen by me.  Please see Dr. Caleen Jobs notes for details.

## 2012-04-13 ENCOUNTER — Ambulatory Visit (HOSPITAL_COMMUNITY)
Admission: RE | Admit: 2012-04-13 | Discharge: 2012-04-13 | Disposition: A | Discharge status: Home / Self Care | Payer: Self-pay | Source: Ambulatory Visit | Attending: Internal Medicine | Admitting: Internal Medicine

## 2012-04-13 DIAGNOSIS — R079 Chest pain, unspecified: Secondary | ICD-10-CM | POA: Insufficient documentation

## 2012-04-13 DIAGNOSIS — I2699 Other pulmonary embolism without acute cor pulmonale: Secondary | ICD-10-CM | POA: Insufficient documentation

## 2012-04-13 DIAGNOSIS — R0602 Shortness of breath: Secondary | ICD-10-CM | POA: Insufficient documentation

## 2012-04-13 NOTE — Progress Notes (Signed)
INTERNAL MEDICINE TEACHING ATTENDING ADDENDUM - Rocco Serene, MD: I personally saw and evaluated Mr. Fury in this clinic visit in conjunction with the resident, Dr. Burtis Junes. I have discussed the patient's plan of care with Dr. Burtis Junes during this visit. I have confirmed the physical exam findings and have read and agree with the clinic note including the plan.  It is likely that Mr. Batdorf has developed a LLE DVT with subsequent PE given that he has spent a significant amount of time, including recently, in the sub-therapeutic range.  Will continue INR monitoring with coumadin management in the anticoagulation clinic and bridge with Lovenox, especially given the very likely recurrent hemodynamically stable pulmonary embolism while sub-therapeutic.

## 2012-04-13 NOTE — Progress Notes (Signed)
*  PRELIMINARY RESULTS* Echocardiogram 2D Echocardiogram has been performed.  Jeryl Columbia 04/13/2012, 10:55 AM

## 2012-04-24 ENCOUNTER — Ambulatory Visit (INDEPENDENT_AMBULATORY_CARE_PROVIDER_SITE_OTHER): Payer: Self-pay | Admitting: Pharmacist

## 2012-04-24 DIAGNOSIS — Z7901 Long term (current) use of anticoagulants: Secondary | ICD-10-CM

## 2012-04-24 DIAGNOSIS — I2699 Other pulmonary embolism without acute cor pulmonale: Secondary | ICD-10-CM

## 2012-04-24 NOTE — Progress Notes (Signed)
Anti-Coagulation Progress Note  Donald Brady is a 36 y.o. male who is currently on an anti-coagulation regimen.    RECENT RESULTS: Recent results are below, the most recent result is correlated with a dose of 300 mg. per week: Lab Results  Component Value Date   INR 1.40 04/24/2012   INR 1.70 04/10/2012   INR 1.90 03/13/2012    ANTI-COAG DOSE:   Latest dosing instructions   Total Sun Mon Tue Wed Thu Fri Sat   320 45 mg 50 mg 45 mg 45 mg 45 mg 45 mg 45 mg    (5 mg9) (5 mg10) (5 mg9) (5 mg9) (5 mg9) (5 mg9) (5 mg9)         ANTICOAG SUMMARY: Anticoagulation Episode Summary              Current INR goal 2.0-3.0 Next INR check 05/08/2012   INR from last check 1.40! (04/24/2012)     Weekly max dose (mg)  Target end date 06/02/2012   Indications Bilateral pulmonary embolism, Encounter for long-term (current) use of anticoagulants   [facility] check location [facility]    Send INR reminders to    Comments             ANTICOAG TODAY: Anticoagulation Summary as of 04/24/2012              INR goal 2.0-3.0     Selected INR 1.40! (04/24/2012) Next INR check 05/08/2012   Weekly max dose (mg)  Target end date 06/02/2012   Indications Bilateral pulmonary embolism, Encounter for long-term (current) use of anticoagulants    Anticoagulation Episode Summary              INR check location [facility] lab    Send INR reminders to    Comments             PATIENT INSTRUCTIONS: Patient Instructions  Patient instructed to take medications as defined in the Anti-coagulation Track section of this encounter.  Patient instructed to take today's dose.  Patient verbalized understanding of these instructions.        FOLLOW-UP Return in 2 weeks (on 05/08/2012) for Follow up INR at 0845h.  Hulen Luster, III Pharm.D., CACP

## 2012-04-24 NOTE — Patient Instructions (Signed)
Patient instructed to take medications as defined in the Anti-coagulation Track section of this encounter.  Patient instructed to take today's dose.  Patient verbalized understanding of these instructions.    

## 2012-05-01 ENCOUNTER — Encounter (HOSPITAL_COMMUNITY): Payer: Self-pay | Admitting: *Deleted

## 2012-05-01 ENCOUNTER — Emergency Department (HOSPITAL_COMMUNITY)
Admission: EM | Admit: 2012-05-01 | Discharge: 2012-05-01 | Disposition: A | Discharge status: Home / Self Care | Payer: Self-pay | Attending: Emergency Medicine | Admitting: Emergency Medicine

## 2012-05-01 ENCOUNTER — Emergency Department (HOSPITAL_COMMUNITY): Payer: Self-pay

## 2012-05-01 DIAGNOSIS — D3500 Benign neoplasm of unspecified adrenal gland: Secondary | ICD-10-CM | POA: Insufficient documentation

## 2012-05-01 DIAGNOSIS — F17200 Nicotine dependence, unspecified, uncomplicated: Secondary | ICD-10-CM | POA: Insufficient documentation

## 2012-05-01 DIAGNOSIS — R079 Chest pain, unspecified: Secondary | ICD-10-CM | POA: Insufficient documentation

## 2012-05-01 DIAGNOSIS — Z7901 Long term (current) use of anticoagulants: Secondary | ICD-10-CM | POA: Insufficient documentation

## 2012-05-01 LAB — CBC WITH DIFFERENTIAL/PLATELET
Lymphocytes Relative: 26 % (ref 12–46)
Lymphs Abs: 3 10*3/uL (ref 0.7–4.0)
MCV: 86.4 fL (ref 78.0–100.0)
Neutrophils Relative %: 65 % (ref 43–77)
Platelets: 302 10*3/uL (ref 150–400)
RBC: 5.3 MIL/uL (ref 4.22–5.81)
WBC: 11.6 10*3/uL — ABNORMAL HIGH (ref 4.0–10.5)

## 2012-05-01 LAB — POCT I-STAT, CHEM 8
Calcium, Ion: 1.16 mmol/L (ref 1.12–1.23)
HCT: 49 % (ref 39.0–52.0)
TCO2: 18 mmol/L (ref 0–100)

## 2012-05-01 LAB — PROTIME-INR
INR: 1.07 (ref 0.00–1.49)
Prothrombin Time: 14.1 seconds (ref 11.6–15.2)

## 2012-05-01 LAB — POCT I-STAT TROPONIN I: Troponin i, poc: 0 ng/mL (ref 0.00–0.08)

## 2012-05-01 LAB — APTT: aPTT: 29 seconds (ref 24–37)

## 2012-05-01 MED ORDER — SODIUM CHLORIDE 0.9 % IV BOLUS (SEPSIS)
1000.0000 mL | Freq: Once | INTRAVENOUS | Status: AC
  Administered 2012-05-01: 1000 mL via INTRAVENOUS

## 2012-05-01 MED ORDER — METHYLPREDNISOLONE SODIUM SUCC 125 MG IJ SOLR
125.0000 mg | Freq: Once | INTRAMUSCULAR | Status: AC
  Administered 2012-05-01: 125 mg via INTRAVENOUS
  Filled 2012-05-01: qty 2

## 2012-05-01 MED ORDER — IOHEXOL 350 MG/ML SOLN
75.0000 mL | Freq: Once | INTRAVENOUS | Status: AC | PRN
  Administered 2012-05-01: 75 mL via INTRAVENOUS

## 2012-05-01 MED ORDER — MORPHINE SULFATE 4 MG/ML IJ SOLN
4.0000 mg | Freq: Once | INTRAMUSCULAR | Status: AC
  Administered 2012-05-01: 4 mg via INTRAVENOUS
  Filled 2012-05-01: qty 1

## 2012-05-01 MED ORDER — TRAMADOL HCL 50 MG PO TABS: 50.0000 mg | ORAL_TABLET | Freq: Four times a day (QID) | ORAL | Status: AC | PRN

## 2012-05-01 MED ORDER — IOHEXOL 350 MG/ML SOLN
100.0000 mL | Freq: Once | INTRAVENOUS | Status: AC | PRN
  Administered 2012-05-01: 100 mL via INTRAVENOUS

## 2012-05-01 NOTE — ED Notes (Signed)
Report taken on patient and pt just now returned from ct.

## 2012-05-01 NOTE — ED Notes (Signed)
Report given to Morgan

## 2012-05-01 NOTE — ED Notes (Signed)
Patient presents via EMS c/o SOB.  Feels like he does when he has a PE.  Trouble taking a deep breath.
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
## 2012-05-01 NOTE — ED Notes (Signed)
Patient states he sat down to watch a football game and started with pain to his back.  When he takes a deep breath he feels like there are a thousand needles sticking in his back.

## 2012-05-01 NOTE — ED Notes (Signed)
Bolus hung, will discharge after completed

## 2012-05-08 ENCOUNTER — Ambulatory Visit (INDEPENDENT_AMBULATORY_CARE_PROVIDER_SITE_OTHER): Payer: Self-pay | Admitting: Pharmacist

## 2012-05-08 DIAGNOSIS — Z7901 Long term (current) use of anticoagulants: Secondary | ICD-10-CM

## 2012-05-08 DIAGNOSIS — I2699 Other pulmonary embolism without acute cor pulmonale: Secondary | ICD-10-CM

## 2012-05-08 LAB — POCT INR: INR: 4.2

## 2012-05-08 NOTE — Patient Instructions (Signed)
Patient instructed to take medications as defined in the Anti-coagulation Track section of this encounter.  Patient instructed to OMIT today's dose.  Patient verbalized understanding of these instructions.    

## 2012-05-08 NOTE — Progress Notes (Signed)
Anti-Coagulation Progress Note  Donald Brady is a 36 y.o. male who is currently on an anti-coagulation regimen.    RECENT RESULTS: Recent results are below, the most recent result is correlated with a dose of 320 mg. per week: Lab Results  Component Value Date   INR 4.20 05/08/2012   INR 1.07 05/01/2012   INR 1.40 04/24/2012    ANTI-COAG DOSE:   Latest dosing instructions   Total Sun Mon Tue Wed Thu Fri Sat   295 40 mg 45 mg 40 mg 45 mg 40 mg 45 mg 40 mg    (5 mg8) (5 mg9) (5 mg8) (5 mg9) (5 mg8) (5 mg9) (5 mg8)         ANTICOAG SUMMARY: Anticoagulation Episode Summary              Current INR goal 2.0-3.0 Next INR check 05/22/2012   INR from last check 4.20! (05/08/2012)     Weekly max dose (mg)  Target end date 06/02/2012   Indications Bilateral pulmonary embolism, Encounter for long-term (current) use of anticoagulants   [facility] check location [facility]    Send INR reminders to    Comments             ANTICOAG TODAY: Anticoagulation Summary as of 05/08/2012              INR goal 2.0-3.0     Selected INR 4.20! (05/08/2012) Next INR check 05/22/2012   Weekly max dose (mg)  Target end date 06/02/2012   Indications Bilateral pulmonary embolism, Encounter for long-term (current) use of anticoagulants    Anticoagulation Episode Summary              INR check location [facility]    Send INR reminders to    Comments             PATIENT INSTRUCTIONS: Patient Instructions  Patient instructed to take medications as defined in the Anti-coagulation Track section of this encounter.  Patient instructed to OMIT today's dose.  Patient verbalized understanding of these instructions.        FOLLOW-UP Return in 2 weeks (on 05/22/2012) for Follow up INR at 0845h.  Hulen Luster, III Pharm.D., CACP

## 2012-05-09 NOTE — Progress Notes (Signed)
Agree with plan 

## 2012-05-22 ENCOUNTER — Ambulatory Visit (INDEPENDENT_AMBULATORY_CARE_PROVIDER_SITE_OTHER): Payer: Self-pay | Admitting: Pharmacist

## 2012-05-22 DIAGNOSIS — Z7901 Long term (current) use of anticoagulants: Secondary | ICD-10-CM

## 2012-05-22 DIAGNOSIS — I2699 Other pulmonary embolism without acute cor pulmonale: Secondary | ICD-10-CM

## 2012-05-22 LAB — POCT INR: INR: 1.1

## 2012-05-22 NOTE — Progress Notes (Signed)
Agree with plan 

## 2012-05-22 NOTE — Progress Notes (Signed)
Anti-Coagulation Progress Note  Donald Brady is a 36 y.o. male who is currently on an anti-coagulation regimen.    RECENT RESULTS: Recent results are below, the most recent result is correlated with a dose of 295 mg. per week: Lab Results  Component Value Date   INR 1.10 05/22/2012   INR 4.20 05/08/2012   INR 1.07 05/01/2012    ANTI-COAG DOSE:   Latest dosing instructions   Total Sun Mon Tue Wed Thu Fri Sat   315 45 mg 45 mg 45 mg 45 mg 45 mg 45 mg 45 mg    (5 mg9) (5 mg9) (5 mg9) (5 mg9) (5 mg9) (5 mg9) (5 mg9)         ANTICOAG SUMMARY: Anticoagulation Episode Summary              Current INR goal 2.0-3.0 Next INR check 05/29/2012   INR from last check 1.10! (05/22/2012)     Weekly max dose (mg)  Target end date 06/02/2012   Indications Bilateral pulmonary embolism, Encounter for long-term (current) use of anticoagulants   [facility]R check location [facility]erred lab    Send INR reminders to    Comments             ANTICOAG TODAY: Anticoagulation Summary as of 05/22/2012              INR goal 2.0-3.0     Selected INR 1.10! (05/22/2012) Next INR check 05/29/2012   Weekly max dose (mg)  Target end date 06/02/2012   Indications Bilateral pulmonary embolism, Encounter for long-term (current) use of anticoagulants    Anticoagulation Episode Summary              INR check location [facility] lab    Send INR reminders to    Comments             PATIENT INSTRUCTIONS: Patient Instructions  Patient instructed to take medications as defined in the Anti-coagulation Track section of this encounter.  Patient instructed to take  today's dose.  Patient verbalized understanding of these instructions.      FOLLOW-UP Return in 7 days (on 05/29/2012) for Follow up INR at 0900h.  Hulen Luster, III Pharm.D., CACP

## 2012-05-22 NOTE — Patient Instructions (Signed)
Patient instructed to take medications as defined in the Anti-coagulation Track section of this encounter.  Patient instructed to take today's dose.  Patient verbalized understanding of these instructions.    

## 2012-05-24 ENCOUNTER — Ambulatory Visit (INDEPENDENT_AMBULATORY_CARE_PROVIDER_SITE_OTHER): Payer: Self-pay | Admitting: Internal Medicine

## 2012-05-24 ENCOUNTER — Encounter: Payer: Self-pay | Admitting: Internal Medicine

## 2012-05-24 VITALS — BP 141/90 | HR 90 | Temp 98.5°F | Ht 74.0 in | Wt 353.7 lb

## 2012-05-24 DIAGNOSIS — Z720 Tobacco use: Secondary | ICD-10-CM

## 2012-05-24 DIAGNOSIS — I2699 Other pulmonary embolism without acute cor pulmonale: Secondary | ICD-10-CM

## 2012-05-24 DIAGNOSIS — F17200 Nicotine dependence, unspecified, uncomplicated: Secondary | ICD-10-CM

## 2012-05-24 DIAGNOSIS — Z7901 Long term (current) use of anticoagulants: Secondary | ICD-10-CM

## 2012-05-24 MED ORDER — HYDROCODONE-ACETAMINOPHEN 5-325 MG PO TABS: 1.0000 | ORAL_TABLET | Freq: Two times a day (BID) | ORAL | Status: DC | PRN

## 2012-05-24 NOTE — Progress Notes (Signed)
  Subjective:    Patient ID: Donald Brady, male    DOB: 10-21-1975, 36 y.o.   MRN: [id number]  HPI patient is a pleasant for your man with past history of bilateral PE in April 2013, obesity and tobacco abuse comes for followup visit. He is been followed by Dr. Alexandria Lodge in [facility] in is been really hard to get him therapeutic on his Coumadin. His last INR on 05/22/2012 was 1.1 which was down from 4.1 on 05/08/2012. He saw Dr. Burtis Junes in end of August for pleuritic chest pain bilaterally and was also evaluated by Dr. Josem Kaufmann and was started on Lovenox shots.  He still complains of similar upper bilateral back pain which was present during last clinic visit and on admission to hospital after PE. The pain gets worse with exertion and with deep breathing. Lovenox shots helped him for 10-15 days. After getting off the shots, he started having the pain again.  He denies any short of breath, dizziness, syncopal spell, worsening leg edema or leg pain.  He states that his taking his Coumadin regularly. Although he is still smoking about a pack a day.  He denies any fever, chills, nausea vomiting, headaches, palpitations, diarrhea, Abdominal pain.    Review of Systems    as per history of present illness, all other systems reviewed and negative. Objective:   Physical Exam  General: NAD- morbidly obese patient HEENT: PERRL, EOMI, no scleral icterus Cardiac: S1, S2, RRR, no rubs, murmurs or gallops Pulm: clear to auscultation bilaterally, moving normal volumes of air. Worsening bilateral upper back pain with deep breathing. Abd: soft, nontender, nondistended, BS present Ext: warm and well perfused, trace pedal edema left leg. Neuro: alert and oriented X3, cranial nerves II-XII grossly intact       Assessment & Plan:

## 2012-05-24 NOTE — Patient Instructions (Addendum)
Please make a followup appointment in 3-4 weeks. Your INR today is 4.1. Keep taking Coumadin as prescribed by Dr. Alexandria Lodge. Use of Vicodin one tablet twice a day as needed for pain.  - Quit smoking as soon as possible.

## 2012-05-24 NOTE — Assessment & Plan Note (Signed)
Extensive counseling to stop smoking given to patient today. He cannot afford the patches. He will try to quit harder at this time. We'll consider bupropion or Chantix during next visit.

## 2012-05-24 NOTE — Assessment & Plan Note (Signed)
Patient probably has chronic PEs. Has family history of clotting disorder- which is uncharacterized at present. Discussed with Dr. Eben Burow in detail about plan and then called Dr. Alexandria Lodge- who recommended getting a finger stick INR and he came down with Lovenox shots. His INR came back 4.1- So we decided against Lovenox shots for now. Dr. Alexandria Lodge will give him a printout for new dosing of Coumadin. I discussed with patient and counseled him extensively to stop smoking- as it causes a procoagulant state and also increases the metabolic of Coumadin. He verbalized understanding and says that he will try harder to quit although appreciated that it is hard.  Spent about more than 30 minutes face-to-face for discussion about importance of stopping smoking, taking medications regularly to prevent further PEs. Reviewed CT scan results with him. He verbalized better understanding of his disease process after all the discussions and expressed his gratitude for the same.  - If his INR remains hard to keep in range, Dr. Alexandria Lodge recommended getting factor X levels and/or warfarin levels. -Also will need to change it to Xarelto- but insurance is an issue for now.

## 2012-05-29 ENCOUNTER — Ambulatory Visit (INDEPENDENT_AMBULATORY_CARE_PROVIDER_SITE_OTHER): Payer: Self-pay | Admitting: Pharmacist

## 2012-05-29 DIAGNOSIS — I2699 Other pulmonary embolism without acute cor pulmonale: Secondary | ICD-10-CM

## 2012-05-29 DIAGNOSIS — Z7901 Long term (current) use of anticoagulants: Secondary | ICD-10-CM

## 2012-05-29 NOTE — Patient Instructions (Signed)
Patient instructed to take medications as defined in the Anti-coagulation Track section of this encounter.  Patient instructed to taqke today's dose.  Patient verbalized understanding of these instructions.

## 2012-05-29 NOTE — Progress Notes (Signed)
Anti-Coagulation Progress Note  Donald Brady is a 36 y.o. male who is currently on an anti-coagulation regimen.    RECENT RESULTS: Recent results are below, the most recent result is correlated with a dose of 315 mg. per week:  Because of the erratic nature of his INR response----I am having collected the following---to rule out "pseudo-warfarin resistence" or discordance of his fingerstick/venous drawn INR with the actual degree of suppression of Clotting Factor X. Normally, on warfarin (with INR 2.0 - 3.0)--if Clotting Factor X were to be measured--we would find it suppressed to about 10 - 30% of its USUAL/NORMAL activity level. By performing this Clotting Factor Assay today--we can determine if the patient is "pseudo-warfarin resistant". IF his Clotting Factor X assay IS between 10 - 30 % of its usual activity level--IRRESPECTIVE of the seemingly LOW INR--we will demonstrate the patient to be anticoagulated quantum sufficient. Additionally, I have had lab perform a VENOUS sampling for traditional assay within the lab to make certain that there dose not exist some variable that is making the POINT OF CARE test un-reliable. Finally, to insure compliance----which the patient "swears to", I am getting a "warfarin/Coumadin LEVEL"--which will simply QUALIFY (and quantify) the PRESENCE or ABSENCE of warfarin in the patient's blood stream.  Lab Results  Component Value Date   INR 1.1 05/29/2012   INR 4.1 05/24/2012   INR 1.10 05/22/2012    ANTI-COAG DOSE:   Latest dosing instructions   Total Sun Mon Tue Wed Thu Fri Sat   310 45 mg 45 mg 45 mg 40 mg 45 mg 45 mg 45 mg    (5 mg9) (5 mg9) (5 mg9) (5 mg8) (5 mg9) (5 mg9) (5 mg9)         ANTICOAG SUMMARY: Anticoagulation Episode Summary              Current INR goal 2.0-3.0 Next INR check 06/12/2012   INR from last check 1.1! (05/29/2012)     Weekly max dose (mg)  Target end date 06/02/2012   Indications Bilateral pulmonary embolism, Encounter  for long-term (current) use of anticoagulants   INR check location [facility]    Send INR reminders to    Comments             ANTICOAG TODAY: Anticoagulation Summary as of 05/29/2012              INR goal 2.0-3.0     Selected INR 1.1! (05/29/2012) Next INR check 06/12/2012   Weekly max dose (mg)  Target end date 06/02/2012   Indications Bilateral pulmonary embolism, Encounter for long-term (current) use of anticoagulants    Anticoagulation Episode Summary              INR check location [facility]    Send INR reminders to    Comments             PATIENT INSTRUCTIONS: Patient Instructions  Patient instructed to take medications as defined in the Anti-coagulation Track section of this encounter.  Patient instructed to taqke today's dose.  Patient verbalized understanding of these instructions.        FOLLOW-UP Return in 2 weeks (on 06/12/2012) for Follow up INR at 0915h.  Hulen Luster, III Pharm.D., CACP

## 2012-05-30 NOTE — Progress Notes (Signed)
I would recommend referral to hematology and follow up with PCP as well to discuss this.

## 2012-05-31 ENCOUNTER — Other Ambulatory Visit: Payer: Self-pay

## 2012-05-31 DIAGNOSIS — I2699 Other pulmonary embolism without acute cor pulmonale: Secondary | ICD-10-CM

## 2012-05-31 DIAGNOSIS — Z7901 Long term (current) use of anticoagulants: Secondary | ICD-10-CM

## 2012-05-31 LAB — FACTOR 10 ASSAY: Factor X Activity: 44 % — ABNORMAL LOW (ref 72–134)

## 2012-06-03 LAB — COUMADIN LEVEL: Coumadin: 2.3 ug/mL (ref 1.0–10.0)

## 2012-06-12 ENCOUNTER — Ambulatory Visit (INDEPENDENT_AMBULATORY_CARE_PROVIDER_SITE_OTHER): Payer: Self-pay | Admitting: Pharmacist

## 2012-06-12 DIAGNOSIS — Z7901 Long term (current) use of anticoagulants: Secondary | ICD-10-CM

## 2012-06-12 DIAGNOSIS — I2699 Other pulmonary embolism without acute cor pulmonale: Secondary | ICD-10-CM

## 2012-06-12 NOTE — Patient Instructions (Signed)
Patient instructed to take medications as defined in the Anti-coagulation Track section of this encounter.  Patient instructed to take today's dose.  Patient verbalized understanding of these instructions.    

## 2012-06-12 NOTE — Progress Notes (Signed)
Anti-Coagulation Progress Note  Donald Brady is a 36 y.o. male who is currently on an anti-coagulation regimen.    RECENT RESULTS: Recent results are below, the most recent result is correlated with a dose of 310 mg. per week:  Results of Clotting Factor X assay performed last week indicated a 48% value (target range for quantum sufficient supression of Clotting Factor X is between 10 - 30%. While his value was recorded ABOVE the desired threshold, his INR today is showing evidence of hypoprothrombinemic response. Of note--patient indicates he has ENTIRELY QUIT SMOKING. Literature does demonstrated that SMOKING may cause an a MORE RAPID CLEARANCE of warfarin. This--and his ethnicity I suspect were playing a part in his warfarin resistance. I will start de-escalating his dose relative to his INR response as well as his having quit smoking which should make his clearance of warfarin go down.  Lab Results  Component Value Date   INR 4.20 06/12/2012   INR 1.1 05/29/2012   INR 0.99 05/29/2012    ANTI-COAG DOSE:   Latest dosing instructions   Total Sun Mon Tue Wed Thu Fri Sat   295 45 mg 40 mg 45 mg 40 mg 40 mg 45 mg 40 mg    (5 mg9) (5 mg8) (5 mg9) (5 mg8) (5 mg8) (5 mg9) (5 mg8)         ANTICOAG SUMMARY: Anticoagulation Episode Summary              Current INR goal 2.0-3.0 Next INR check 06/26/2012   INR from last check 4.20! (06/12/2012)     Weekly max dose (mg)  Target end date 06/02/2012   Indications Bilateral pulmonary embolism, Encounter for long-term (current) use of anticoagulants   INR check location [facility]    Send INR reminders to    Comments             ANTICOAG TODAY: Anticoagulation Summary as of 06/12/2012              INR goal 2.0-3.0     Selected INR 4.20! (06/12/2012) Next INR check 06/26/2012   Weekly max dose (mg)  Target end date 06/02/2012   Indications Bilateral pulmonary embolism, Encounter for long-term (current) use of  anticoagulants    Anticoagulation Episode Summary              INR check location [facility] lab    Send INR reminders to    Comments             PATIENT INSTRUCTIONS: Patient Instructions  Patient instructed to take medications as defined in the Anti-coagulation Track section of this encounter.  Patient instructed to take today's dose.  Patient verbalized understanding of these instructions.        FOLLOW-UP Return in 2 weeks (on 06/26/2012) for Follow up INR at 0915h.  Hulen Luster, III Pharm.D., CACP

## 2012-06-14 ENCOUNTER — Encounter: Payer: Self-pay | Admitting: Internal Medicine

## 2012-06-26 ENCOUNTER — Ambulatory Visit: Payer: Self-pay

## 2012-06-26 ENCOUNTER — Ambulatory Visit (INDEPENDENT_AMBULATORY_CARE_PROVIDER_SITE_OTHER): Payer: Self-pay | Admitting: Pharmacist

## 2012-06-26 DIAGNOSIS — Z7901 Long term (current) use of anticoagulants: Secondary | ICD-10-CM

## 2012-06-26 DIAGNOSIS — I2699 Other pulmonary embolism without acute cor pulmonale: Secondary | ICD-10-CM

## 2012-06-26 LAB — POCT INR: INR: 2

## 2012-06-26 NOTE — Patient Instructions (Addendum)
Patient instructed to take medications as defined in the Anti-coagulation Track section of this encounter.  Patient instructed to take today's dose.  Patient verbalized understanding of these instructions.    

## 2012-06-26 NOTE — Progress Notes (Signed)
Anti-Coagulation Progress Note  Donald Brady is a 36 y.o. male who is currently on an anti-coagulation regimen.    RECENT RESULTS: Recent results are below, the most recent result is correlated with a dose of 295 mg. per week: Lab Results  Component Value Date   INR 2.00 06/26/2012   INR 4.20 06/12/2012   INR 1.1 05/29/2012    ANTI-COAG DOSE:   Latest dosing instructions   Total Sun Mon Tue Wed Thu Fri Sat   310 45 mg 45 mg 45 mg 45 mg 45 mg 40 mg 45 mg    (5 mg9) (5 mg9) (5 mg9) (5 mg9) (5 mg9) (5 mg8) (5 mg9)         ANTICOAG SUMMARY: Anticoagulation Episode Summary              Current INR goal 2.0-3.0 Next INR check 07/17/2012   INR from last check 2.00 (06/26/2012)     Weekly max dose (mg)  Target end date 06/02/2012   Indications Bilateral pulmonary embolism [415.19], Encounter for long-term (current) use of anticoagulants [V58.61]   INR check location [facility]    Send INR reminders to    Comments             ANTICOAG TODAY: Anticoagulation Summary as of 06/26/2012              INR goal 2.0-3.0     Selected INR 2.00 (06/26/2012) Next INR check 07/17/2012   Weekly max dose (mg)  Target end date 06/02/2012   Indications Bilateral pulmonary embolism [415.19], Encounter for long-term (current) use of anticoagulants [V58.61]    Anticoagulation Episode Summary              INR check location [facility]    Send INR reminders to    Comments             PATIENT INSTRUCTIONS: Patient Instructions  Patient instructed to take medications as defined in the Anti-coagulation Track section of this encounter.  Patient instructed to take today's dose.  Patient verbalized understanding of these instructions.        FOLLOW-UP Return in 3 weeks (on 07/17/2012) for Follow up INR at 1115h.  Hulen Luster, III Pharm.D., CACP

## 2012-06-28 ENCOUNTER — Encounter: Payer: Self-pay | Admitting: Internal Medicine

## 2012-06-28 ENCOUNTER — Ambulatory Visit (INDEPENDENT_AMBULATORY_CARE_PROVIDER_SITE_OTHER): Payer: Self-pay | Admitting: Internal Medicine

## 2012-06-28 VITALS — BP 124/84 | HR 84 | Temp 98.2°F | Ht 74.0 in | Wt 353.6 lb

## 2012-06-28 DIAGNOSIS — Z7901 Long term (current) use of anticoagulants: Secondary | ICD-10-CM

## 2012-06-28 DIAGNOSIS — I2699 Other pulmonary embolism without acute cor pulmonale: Secondary | ICD-10-CM

## 2012-06-28 DIAGNOSIS — Z720 Tobacco use: Secondary | ICD-10-CM

## 2012-06-28 DIAGNOSIS — M7989 Other specified soft tissue disorders: Secondary | ICD-10-CM

## 2012-06-28 DIAGNOSIS — Z23 Encounter for immunization: Secondary | ICD-10-CM

## 2012-06-28 DIAGNOSIS — M1712 Unilateral primary osteoarthritis, left knee: Secondary | ICD-10-CM | POA: Insufficient documentation

## 2012-06-28 DIAGNOSIS — E669 Obesity, unspecified: Secondary | ICD-10-CM

## 2012-06-28 DIAGNOSIS — M25462 Effusion, left knee: Secondary | ICD-10-CM

## 2012-06-28 DIAGNOSIS — F17200 Nicotine dependence, unspecified, uncomplicated: Secondary | ICD-10-CM

## 2012-06-28 MED ORDER — HYDROCODONE-ACETAMINOPHEN 5-325 MG PO TABS: 1.0000 | ORAL_TABLET | Freq: Two times a day (BID) | ORAL | Status: DC | PRN

## 2012-06-28 NOTE — Assessment & Plan Note (Signed)
Patient exercises regularly. Walks about 3 months on treadmill everyday. He seems motivated to lose weight. Quick short information about diet provided.

## 2012-06-28 NOTE — Assessment & Plan Note (Signed)
Patient quit smoking since last visit. Congratulated him for his achievement and motivated him to stay off from smoking. He seems highly motivated.

## 2012-06-28 NOTE — Addendum Note (Signed)
Addended by: Angelina Ok F on: 06/28/2012 04:05 PM   Modules accepted: Orders

## 2012-06-28 NOTE — Assessment & Plan Note (Signed)
Continue Coumadin lifelong or other anticoagulant. Patient with most likely recurrent or chronic PEs. INR bette controlled after quitting smoking.

## 2012-06-28 NOTE — Patient Instructions (Signed)
Please make a followup appointment in 8-10 weeks. I will refill Vicodin until that time. Take ibuprofen 600 mg 3 times a day with meals for next 7-10 days for left knee pain and swelling. If the knee pain and swelling gets worse or does not improve with above therapy, call us.  Congratulations for quitting smoking!!!   Continue taking Coumadin and followup with Dr. Alexandria Lodge.

## 2012-06-28 NOTE — Progress Notes (Signed)
  Subjective:    Patient ID: Donald Brady, male    DOB: 25-Apr-1976, 36 y.o.   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  He reports left knee swelling and pain which started about 2 weeks before. Denies any trauma or movement of the joint while walking.  He reports worsening pain first thing in the morning and also pain gets worse with walking although is tolerable with Vicodin. Does not have any redness, increased warmth or any other signs of infection anywhere else.  Smoking: He quit smoking since last visit. Congratulated him for his achievement and motivated him to stay away from smoking. He reports feeling much better overall. His exercise capacity has also increased in his bilateral upper back pain/lung pain are much improved.  He is to be continued on Coumadin indefinitely. INR under better control.  He denies any fever, chills, nausea, vomiting, abdominal pain, chest pain, shortness of breath, diarrhea.   Review of Systems    as per history of present illness, all other systems reviewed and negative. Objective:   Physical Exam  General: NAD HEENT: PERRL, EOMI, no scleral icterus Cardiac: S1, S2, RRR, no rubs, murmurs or gallops Pulm: clear to auscultation bilaterally, moving normal volumes of air Abd: soft, nontender, nondistended, BS present Ext: warm and well perfused, no pedal edema. Left knee swelling anteriorly. Tenderness to palpation posteriorly. No redness or increased warmth or any ulcers or signs of infection. Neuro: alert and oriented X3, cranial nerves II-XII grossly intact       Assessment & Plan:

## 2012-06-28 NOTE — Assessment & Plan Note (Addendum)
Left knee anterior swelling with some tenderness to palpation on the posterior aspect of the joint. Increased pain in morning and gets worse with walking. No redness or drainage or open ulcer. No signs of infection. No increased warmth. - No history of trauma. Unclear etiology. Likely benign swelling from bursitis. - Advised him to start taking ibuprofen 600 mg 3 times a day with meals for 7-10 days. Also refilled his Vicodin 60 tablets with one refill. This is to continue help him will assess chronic bilateral upper back pain likely from chronic PEs. We'll consider stopping it in January. - To call back if pain and swelling is worse he notices any redness.

## 2012-07-17 ENCOUNTER — Ambulatory Visit (INDEPENDENT_AMBULATORY_CARE_PROVIDER_SITE_OTHER): Payer: Self-pay | Admitting: Pharmacist

## 2012-07-17 DIAGNOSIS — I2699 Other pulmonary embolism without acute cor pulmonale: Secondary | ICD-10-CM

## 2012-07-17 DIAGNOSIS — Z7901 Long term (current) use of anticoagulants: Secondary | ICD-10-CM

## 2012-07-17 LAB — POCT INR: INR: 5.4

## 2012-07-17 MED ORDER — WARFARIN SODIUM 5 MG PO TABS: ORAL_TABLET | ORAL | Status: DC

## 2012-07-17 NOTE — Patient Instructions (Signed)
Patient instructed to take medications as defined in the Anti-coagulation Track section of this encounter.  Patient instructed to OMIT/HOLD today's dose.  Patient verbalized understanding of these instructions.    

## 2012-07-17 NOTE — Progress Notes (Signed)
Agree with Dr. Groce's Plan. 

## 2012-07-17 NOTE — Progress Notes (Signed)
Anti-Coagulation Progress Note  Donald Brady is a 36 y.o. male who is currently on an anti-coagulation regimen.    RECENT RESULTS: Recent results are below, the most recent result is correlated with a dose of 310 mg. per week: Lab Results  Component Value Date   INR 5.4 07/17/2012   INR 2.00 06/26/2012   INR 4.20 06/12/2012    ANTI-COAG DOSE:   Latest dosing instructions   Total Sun Mon Tue Wed Thu Fri Sat   270 45 mg 30 mg 45 mg 30 mg 45 mg 30 mg 45 mg    (5 mg9) (5 mg6) (5 mg9) (5 mg6) (5 mg9) (5 mg6) (5 mg9)         ANTICOAG SUMMARY: Anticoagulation Episode Summary              Current INR goal 2.0-3.0 Next INR check 07/31/2012   INR from last check 5.4! (07/17/2012)     Weekly max dose (mg)  Target end date 06/02/2012   Indications Bilateral pulmonary embolism [415.19], Long term (current) use of anticoagulants [V58.61]   [facility] check location [facility]    Send INR reminders to    Comments             ANTICOAG TODAY: Anticoagulation Summary as of 07/17/2012              INR goal 2.0-3.0     Selected INR 5.4! (07/17/2012) Next INR check 07/31/2012   Weekly max dose (mg)  Target end date 06/02/2012   Indications Bilateral pulmonary embolism [415.19], Long term (current) use of anticoagulants [V58.61]    Anticoagulation Episode Summary              IN[facility] check location [facility]    Send INR reminders to    Comments             PATIENT INSTRUCTIONS: Patient Instructions  Patient instructed to take medications as defined in the Anti-coagulation Track section of this encounter.  Patient instructed to OMIT/HOLD today's dose.  Patient verbalized understanding of these instructions.        FOLLOW-UP Return in 2 weeks (on 07/31/2012) for Follow up INR at 0845h.  Hulen Luster, III Pharm.D., CACP

## 2012-07-19 ENCOUNTER — Telehealth: Payer: Self-pay | Admitting: *Deleted

## 2012-07-19 NOTE — Telephone Encounter (Signed)
Thanks a lot. Pt might need tapping of knee joint.  Donald Brady.

## 2012-07-19 NOTE — Telephone Encounter (Signed)
Pt called into clinic and wanted to let you know that his knees are building up fluid.  He states it is excruciating to walk.  Hurts when sitting or standing.  You asked him to call clinic is not improved. Pt called and states he has taken meds as ordered ( IBU ) with no relief.   I scheduled for tomorrow AM in clinic.  Pt # [id number]

## 2012-07-20 ENCOUNTER — Ambulatory Visit (INDEPENDENT_AMBULATORY_CARE_PROVIDER_SITE_OTHER): Payer: No Typology Code available for payment source | Admitting: Radiation Oncology

## 2012-07-20 ENCOUNTER — Encounter: Payer: Self-pay | Admitting: Radiation Oncology

## 2012-07-20 VITALS — BP 143/97 | HR 91 | Temp 99.0°F | Ht 74.0 in | Wt 358.4 lb

## 2012-07-20 DIAGNOSIS — I2699 Other pulmonary embolism without acute cor pulmonale: Secondary | ICD-10-CM

## 2012-07-20 DIAGNOSIS — M7989 Other specified soft tissue disorders: Secondary | ICD-10-CM

## 2012-07-20 DIAGNOSIS — Z720 Tobacco use: Secondary | ICD-10-CM

## 2012-07-20 DIAGNOSIS — M25462 Effusion, left knee: Secondary | ICD-10-CM

## 2012-07-20 DIAGNOSIS — F17200 Nicotine dependence, unspecified, uncomplicated: Secondary | ICD-10-CM

## 2012-07-20 MED ORDER — HYDROCODONE-ACETAMINOPHEN 5-325 MG PO TABS: 2.0000 | ORAL_TABLET | Freq: Four times a day (QID) | ORAL | Status: DC | PRN

## 2012-07-20 MED ORDER — VARENICLINE TARTRATE 0.5 MG X 11 & 1 MG X 42 PO MISC: ORAL | Status: DC
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
## 2012-07-20 NOTE — Patient Instructions (Addendum)
Knee Pain  Knee pain can be a result of an injury or other medical conditions. Treatment will depend on the cause of your pain.  HOME CARE   Only take medicine as told by your doctor.   Keep a healthy weight. Being overweight can make the knee hurt more.   Stretch before exercising or playing sports.   If there is constant knee pain, change the way you exercise. Ask your doctor for advice.   Make sure shoes fit well. Choose the right shoe for the sport or activity.   Protect your knees. Wear kneepads if needed.   Rest when you are tired.  GET HELP RIGHT AWAY IF:    Your knee pain does not stop.   Your knee pain does not get better.   Your knee joint feels hot to the touch.   You have a temperature by mouth above 102 F (38.9 C), not controlled by medicine.   Your baby is older than 3 months with a rectal temperature of 102 F (38.9 C) or higher.   Your baby is 3 months old or younger with a rectal temperature of 100.4 F (38 C) or higher.  MAKE SURE YOU:    Understand these instructions.   Will watch this condition.   Will get help right away if you are not doing well or get worse.  Document Released: 10/29/2008 Document Revised: 10/25/2011 Document Reviewed: 10/29/2008  [employer] Patient Information 2013 [employer].

## 2012-07-21 NOTE — Assessment & Plan Note (Signed)
Elevated INR this week, but no findings consistent with hematoma from patient's fall and resulting abrasion of the L forehead.  - cont warfarin

## 2012-07-21 NOTE — Assessment & Plan Note (Addendum)
Pt's signs/symptoms are unchanged since previous visit (see Dr. Eliane Decree note from 06/28/2012), thus patient will be referred to sports medicine for further evaluation/management. Given pt states he symptoms started at the same time he started warfarin anticoagulation, there is some concern for hemarthrosis contributing to his joint effusion. Due to concern for possible ligamentous injury pt will also be referred for MRI of the L knee.  - refill norco (120ct) - d/c ibuprofen in setting of supratherapeutic INRs (and as pt states it was giving him limited relief) - sports medicine referral - MRI L knee

## 2012-07-21 NOTE — Assessment & Plan Note (Addendum)
Pt successfully d/c'd tobacco ~1 month prior, but still having nicotine cravings. Pt prescribed chantix during this visit. He was counseled on the side effects of this medication, and instructed to contact the clinic if he experienced any new symptoms.

## 2012-07-31 ENCOUNTER — Ambulatory Visit (INDEPENDENT_AMBULATORY_CARE_PROVIDER_SITE_OTHER): Payer: No Typology Code available for payment source | Admitting: Sports Medicine

## 2012-07-31 ENCOUNTER — Ambulatory Visit (INDEPENDENT_AMBULATORY_CARE_PROVIDER_SITE_OTHER): Payer: No Typology Code available for payment source | Admitting: Pharmacist

## 2012-07-31 ENCOUNTER — Encounter: Payer: Self-pay | Admitting: Internal Medicine

## 2012-07-31 VITALS — BP 142/102 | Ht 75.0 in | Wt 356.0 lb

## 2012-07-31 DIAGNOSIS — I2699 Other pulmonary embolism without acute cor pulmonale: Secondary | ICD-10-CM

## 2012-07-31 DIAGNOSIS — M25569 Pain in unspecified knee: Secondary | ICD-10-CM

## 2012-07-31 DIAGNOSIS — Z7901 Long term (current) use of anticoagulants: Secondary | ICD-10-CM
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
## 2012-07-31 NOTE — Progress Notes (Signed)
Anti-Coagulation Progress Note  JEREL SARDINA is a 36 y.o. male who is currently on an anti-coagulation regimen.    RECENT RESULTS: Recent results are below, the most recent result is correlated with a dose of 270 mg. per week: Lab Results  Component Value Date   INR 1.20 07/31/2012   INR 5.4 07/17/2012   INR 2.00 06/26/2012    ANTI-COAG DOSE:   Latest dosing instructions   Total Sun Mon Tue Wed Thu Fri Sat   300 45 mg 45 mg 45 mg 45 mg 45 mg 30 mg 45 mg    (5 mg9) (5 mg9) (5 mg9) (5 mg9) (5 mg9) (5 mg6) (5 mg9)         ANTICOAG SUMMARY: Anticoagulation Episode Summary              Current INR goal 2.0-3.0 Next INR check 08/21/2012   INR from last check 1.20! (07/31/2012)     Weekly max dose (mg)  Target end date 06/02/2012   Indications Bilateral pulmonary embolism [415.19], Long term (current) use of anticoagulants [V58.61]   [facility] check location [facility]    Send INR reminders to    Comments             ANTICOAG TODAY: Anticoagulation Summary as of 07/31/2012              INR goal 2.0-3.0     Selected INR 1.20! (07/31/2012) Next INR check 08/21/2012   Weekly max dose (mg)  Target end date 06/02/2012   Indications Bilateral pulmonary embolism [415.19], Long term (current) use of anticoagulants [V58.61]    Anticoagulation Episode Summary              [facility]R check location [facility]    Send INR reminders to    Comments             PATIENT INSTRUCTIONS: Patient Instructions  Patient instructed to take medications as defined in the Anti-coagulation Track section of this encounter.  Patient instructed to take today's dose.  Patient verbalized understanding of these instructions.        FOLLOW-UP Return in 3 weeks (on 08/21/2012) for Follow up INR at 0900h.  Hulen Luster, III Pharm.D., CACP

## 2012-07-31 NOTE — Patient Instructions (Signed)
Patient instructed to take medications as defined in the Anti-coagulation Track section of this encounter.  Patient instructed to take today's dose.  Patient verbalized understanding of these instructions.    

## 2012-08-07 ENCOUNTER — Ambulatory Visit (HOSPITAL_COMMUNITY)
Admission: RE | Admit: 2012-08-07 | Discharge: 2012-08-07 | Disposition: A | Discharge status: Home / Self Care | Payer: No Typology Code available for payment source | Source: Ambulatory Visit | Attending: Sports Medicine | Admitting: Sports Medicine

## 2012-08-07 DIAGNOSIS — M899 Disorder of bone, unspecified: Secondary | ICD-10-CM | POA: Insufficient documentation

## 2012-08-07 DIAGNOSIS — M949 Disorder of cartilage, unspecified: Secondary | ICD-10-CM | POA: Insufficient documentation

## 2012-08-07 DIAGNOSIS — M25569 Pain in unspecified knee: Secondary | ICD-10-CM | POA: Insufficient documentation

## 2012-08-21 ENCOUNTER — Ambulatory Visit (INDEPENDENT_AMBULATORY_CARE_PROVIDER_SITE_OTHER): Payer: No Typology Code available for payment source | Admitting: Pharmacist

## 2012-08-21 ENCOUNTER — Telehealth: Payer: Self-pay | Admitting: Sports Medicine

## 2012-08-21 DIAGNOSIS — I2699 Other pulmonary embolism without acute cor pulmonale: Secondary | ICD-10-CM

## 2012-08-21 DIAGNOSIS — Z7901 Long term (current) use of anticoagulants: Secondary | ICD-10-CM

## 2012-08-21 NOTE — Telephone Encounter (Signed)
I spoke with the patient today on the phone regarding x-ray findings of his left knee. He has some mild degenerative changes in the medial compartment. Hardware from his previous ORIF is intact. He is feeling better after his recent cortisone injection. He is trying to lose weight. I reassured him that I do not see any significant arthritis in this knee at this point in time. Encouraged him to continue with exercise, especially a water aerobics program if he can find one. He understands the importance of weight loss in the overall health of his knee. He will followup with me when necessary.

## 2012-08-21 NOTE — Progress Notes (Signed)
Anti-Coagulation Progress Note  Donald Brady is a 37 y.o. male who is currently on an anti-coagulation regimen.    RECENT RESULTS: Recent results are below, the most recent result is correlated with a dose of 300 mg. per week: Lab Results  Component Value Date   INR 3.30 08/21/2012   INR 3.3. 08/21/2012   INR 1.20 07/31/2012    ANTI-COAG DOSE:   Latest dosing instructions   Total Sun Mon Tue Wed Thu Fri Sat   300 45 mg 45 mg 45 mg 45 mg 45 mg 30 mg 45 mg    (5 mg9) (5 mg9) (5 mg9) (5 mg9) (5 mg9) (5 mg6) (5 mg9)         ANTICOAG SUMMARY: Anticoagulation Episode Summary              Current INR goal 2.0-3.0 Next INR check 09/11/2012   INR from last check 3.30! (08/21/2012)     Weekly max dose (mg)  Target end date 06/02/2012   Indications Bilateral pulmonary embolism [415.19], Long term (current) use of anticoagulants [V58.61]   [facility] check location [facility]    Send INR reminders to    Comments             ANTICOAG TODAY: Anticoagulation Summary as of 08/21/2012              INR goal 2.0-3.0     Selected INR 3.30! (08/21/2012) Next INR check 09/11/2012   Weekly max dose (mg)  Target end date 06/02/2012   Indications Bilateral pulmonary embolism [415.19], Long term (current) use of anticoagulants [V58.61]    Anticoagulation Episode Summary              [facility] check location [facility]    Send INR reminders to    Comments             PATIENT INSTRUCTIONS: Patient Instructions  Patient instructed to take medications as defined in the Anti-coagulation Track section of this encounter.  Patient instructed to take today's dose.  Patient verbalized understanding of these instructions.        FOLLOW-UP Return in 3 weeks (on 09/11/2012) for Follow up INR at 0930h.  Hulen Luster, III Pharm.D., CACP

## 2012-08-21 NOTE — Patient Instructions (Signed)
Patient instructed to take medications as defined in the Anti-coagulation Track section of this encounter.  Patient instructed to take today's dose.  Patient verbalized understanding of these instructions.    

## 2012-09-11 ENCOUNTER — Ambulatory Visit (INDEPENDENT_AMBULATORY_CARE_PROVIDER_SITE_OTHER): Payer: No Typology Code available for payment source | Admitting: Pharmacist

## 2012-09-11 DIAGNOSIS — Z7901 Long term (current) use of anticoagulants: Secondary | ICD-10-CM

## 2012-09-11 DIAGNOSIS — I2699 Other pulmonary embolism without acute cor pulmonale: Secondary | ICD-10-CM

## 2012-09-11 LAB — POCT INR: INR: 1.2

## 2012-09-11 NOTE — Progress Notes (Signed)
Anti-Coagulation Progress Note  Donald Brady is a 38 y.o. male who is currently on an anti-coagulation regimen.    RECENT RESULTS: Recent results are below, the most recent result is correlated with a dose of 210 mg. per week (missed doses on Saturday/Sunday before today's appointment). Lab Results  Component Value Date   INR 1.20 09/11/2012   INR 3.30 08/21/2012   INR 3.3. 08/21/2012    ANTI-COAG DOSE:   Latest dosing instructions   Total Sun Mon Tue Wed Thu Fri Sat   300 45 mg 45 mg 45 mg 45 mg 45 mg 30 mg 45 mg    (5 mg9) (5 mg9) (5 mg9) (5 mg9) (5 mg9) (5 mg6) (5 mg9)         ANTICOAG SUMMARY: Anticoagulation Episode Summary              Current INR goal 2.0-3.0 Next INR check 10/02/2012   INR from last check 1.20! (09/11/2012)     Weekly max dose (mg)  Target end date 06/02/2012   Indications Bilateral pulmonary embolism [415.19], Long term (current) use of anticoagulants [V58.61]   [facility]R check location [facility]    Send INR reminders to    Comments             ANTICOAG TODAY: Anticoagulation Summary as of 09/11/2012              INR goal 2.0-3.0     Selected INR 1.20! (09/11/2012) Next INR check 10/02/2012   Weekly max dose (mg)  Target end date 06/02/2012   Indications Bilateral pulmonary embolism [415.19], Long term (current) use of anticoagulants [V58.61]    Anticoagulation Episode Summary              [facility]R check location [facility]    Send INR reminders to    Comments             PATIENT INSTRUCTIONS: Patient Instructions  Patient instructed to take medications as defined in the Anti-coagulation Track section of this encounter.  Patient instructed to take today's dose.  Patient verbalized understanding of these instructions.        FOLLOW-UP Return in 3 weeks (on 10/02/2012) for Follow up INR at 0945h.  Hulen Luster, III Pharm.D., CACP

## 2012-09-11 NOTE — Patient Instructions (Signed)
Patient instructed to take medications as defined in the Anti-coagulation Track section of this encounter.  Patient instructed to take today's dose.  Patient verbalized understanding of these instructions.    

## 2012-10-02 ENCOUNTER — Ambulatory Visit (INDEPENDENT_AMBULATORY_CARE_PROVIDER_SITE_OTHER): Payer: No Typology Code available for payment source | Admitting: Pharmacist

## 2012-10-02 DIAGNOSIS — I2699 Other pulmonary embolism without acute cor pulmonale: Secondary | ICD-10-CM

## 2012-10-02 DIAGNOSIS — Z7901 Long term (current) use of anticoagulants: Secondary | ICD-10-CM

## 2012-10-02 LAB — POCT INR: INR: 1.2

## 2012-10-02 NOTE — Patient Instructions (Signed)
Patient instructed to take medications as defined in the Anti-coagulation Track section of this encounter.  Patient instructed to take today's dose.  Patient verbalized understanding of these instructions.    

## 2012-10-02 NOTE — Progress Notes (Signed)
Anti-Coagulation Progress Note  Donald Brady is a 37 y.o. male who is currently on an anti-coagulation regimen.    RECENT RESULTS: Recent results are below, the most recent result is correlated with a dose of 300 mg. per week: Lab Results  Component Value Date   INR 1.20 10/02/2012   INR 1.20 09/11/2012   INR 3.30 08/21/2012    ANTI-COAG DOSE: Anticoagulation Dose Instructions as of 10/02/2012     Glynis Smiles Tue Wed Thu Fri Sat   New Dose 45 mg 50 mg 50 mg 50 mg 50 mg 50 mg 45 mg    Description       As per doseresponse provided dosing instruction sheet.       ANTICOAG SUMMARY: Anticoagulation Episode Summary   Current INR goal 2.0-3.0  Next INR check 10/16/2012  INR from last check 1.20! (10/02/2012)  Weekly max dose   Target end date 06/02/2012  [facility] check [facility]  Preferred lab   Send INR reminders to    Indications  Bilateral pulmonary embolism [415.19] Long term (current) use of anticoagulants [V58.61]        Comments         ANTICOAG TODAY: Anticoagulation Summary as of 10/02/2012   INR goal 2.0-3.0  Selected INR 1.20! (10/02/2012)  Next INR check 10/16/2012  Target end date 06/02/2012   Indications  Bilateral pulmonary embolism [415.19] Long term (current) use of anticoagulants [V58.61]      Anticoagulation Episode Summary   [facility] check [facility]   Preferred lab    Send INR reminders to    Comments       PATIENT INSTRUCTIONS: Patient Instructions  Patient instructed to take medications as defined in the Anti-coagulation Track section of this encounter.  Patient instructed to take today's dose.  Patient verbalized understanding of these instructions.       FOLLOW-UP Return in 2 weeks (on 10/16/2012) for Follow up INR at 0945h.  Hulen Luster, III Pharm.D., CACP

## 2012-10-16 ENCOUNTER — Ambulatory Visit: Payer: No Typology Code available for payment source

## 2012-11-01 ENCOUNTER — Encounter: Payer: Self-pay | Admitting: Internal Medicine

## 2012-11-01 ENCOUNTER — Ambulatory Visit (INDEPENDENT_AMBULATORY_CARE_PROVIDER_SITE_OTHER): Payer: No Typology Code available for payment source | Admitting: Internal Medicine

## 2012-11-01 VITALS — BP 146/98 | HR 88 | Temp 97.6°F | Ht 75.0 in | Wt 363.5 lb

## 2012-11-01 DIAGNOSIS — E669 Obesity, unspecified: Secondary | ICD-10-CM

## 2012-11-01 MED ORDER — HYDROCODONE-ACETAMINOPHEN 5-325 MG PO TABS: 2.0000 | ORAL_TABLET | Freq: Four times a day (QID) | ORAL | Status: DC | PRN

## 2012-11-01 NOTE — Progress Notes (Signed)
  Subjective:    Patient ID: Donald Brady, male    DOB: Jul 12, 1976, 37 y.o.   MRN: [id number]  HPI patient is a pleasant 37 year old man with history of bilateral PE, obesity, left knee osteoarthritis and other problems as per problem list who comes to the clinic for followup visit for his chest and back pain.  History having this chronic lower back and anterior chest pleuritic pain after he had PEs. He has pleuritic chest pain with deep inspiration. This limits his activity - including walking, exercising. Last weekend he got short of breath and started having pain when he went out for a walk with his daughter.   Vicodin helps the pain.  Denies any fever, chills, nausea, vomiting, abdominal pain, diarrhea, constipation. His last INR was 1.2 in February- he says that he had smoked few cigarettes while watching the game and so that probably tripped his INR below normal.  Cortisol injection in left knee helped his pain for about 3 months, which started coming back in about a month before, although he does not have significant effusion at present.   Review of Systems    as per history of present illness. Objective:   Physical Exam  General: NAD HEENT: PERRL, EOMI, no scleral icterus Cardiac:  S1, S2,RRR, no rubs, murmurs or gallops Pulm: clear to auscultation bilaterally, moving normal volumes of air Abd: soft, nontender, nondistended, BS present Ext: warm and well perfused, no pedal edema Neuro: alert and oriented X3, cranial nerves II-XII grossly intact       Assessment & Plan:

## 2012-11-01 NOTE — Assessment & Plan Note (Signed)
His chest and back pain after his PE are being continuing for longer than expected. Unclear if he has chronic PE.  His INR has not been completely within range- which might be an issue. Although, would need specialty help from pulmonology to evaluate his chronic leg and back pain and see if this is associated with chronic PEs. Referral to [facility] pulmonology made today. Refill prescription for Vicodin today.

## 2012-11-01 NOTE — Assessment & Plan Note (Signed)
Will discuss in about bariatric surgery and if he is interested will refer him to bariatric clinic during next visit

## 2012-11-01 NOTE — Assessment & Plan Note (Signed)
Currently stable after cortisone injection.  He will make an appointment with sports medicine clinic as needed. To avoid NSAIDs as he is on Coumadin. Will refill his Vicodin today.

## 2012-11-01 NOTE — Patient Instructions (Signed)
Please followup with pulmonology. Please make a followup appointment in May. Take your Coumadin regularly. Take Vicodin as needed for pain.

## 2012-11-08 ENCOUNTER — Ambulatory Visit (INDEPENDENT_AMBULATORY_CARE_PROVIDER_SITE_OTHER): Payer: No Typology Code available for payment source | Admitting: Sports Medicine

## 2012-11-08 VITALS — BP 142/100

## 2012-11-08 DIAGNOSIS — M25562 Pain in left knee: Secondary | ICD-10-CM

## 2012-11-08 DIAGNOSIS — M25569 Pain in unspecified knee: Secondary | ICD-10-CM

## 2012-11-08 NOTE — Progress Notes (Signed)
  Subjective:    Patient ID: Donald Brady, male    DOB: 1976-03-20, 37 y.o.   MRN: [id number]  HPI chief complaint: Left knee  A she comes in today with returning left knee pain. He received a cortisone injection in his left knee back in January. This provided him with good symptom relief up until about a week ago. Pain has returned and is identical in nature to what he's experienced previously. No recent trauma. Recent x-rays of his left knee showed some mild degenerative changes.  Interim medical history is unchanged    Review of Systems     Objective:   Physical Exam Well-developed, obese. No acute distress  Left knee: Range of motion from 0-120. Trace effusion. Mild tenderness to palpation along the medial joint line. Negative McMurray's. Good ligamentous stability. Neurovascular intact distally. Walking with a slight limp.       Assessment & Plan:  1. 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 Anterior lateral approach was utilized. If symptoms persist I would consider merits of further diagnostic imaging. Followup for ongoing or recalcitrant issues.  Consent obtained and verified. Time-out conducted. Noted no overlying erythema, induration, or other signs of local infection. Skin prepped in a sterile fashion. Topical analgesic spray: Ethyl chloride. Joint: left knee Needle: 22g 11/2 inch Completed without difficulty. Meds: 6cc 1% lidocaine, 2cc (80mg  total) depomedrol  Advised to call if fevers/chills, erythema, induration, drainage, or persistent bleeding.

## 2012-11-21 ENCOUNTER — Ambulatory Visit: Payer: No Typology Code available for payment source

## 2012-11-22 ENCOUNTER — Encounter: Payer: Self-pay | Admitting: Internal Medicine

## 2012-11-22 DIAGNOSIS — R269 Unspecified abnormalities of gait and mobility: Secondary | ICD-10-CM | POA: Insufficient documentation

## 2012-11-24 ENCOUNTER — Ambulatory Visit: Payer: Self-pay

## 2012-11-28 ENCOUNTER — Ambulatory Visit (INDEPENDENT_AMBULATORY_CARE_PROVIDER_SITE_OTHER): Payer: Self-pay | Admitting: Pulmonary Disease

## 2012-11-28 ENCOUNTER — Encounter: Payer: Self-pay | Admitting: Pulmonary Disease

## 2012-11-28 VITALS — BP 132/90 | HR 85 | Temp 98.5°F | Ht 75.0 in | Wt 355.0 lb

## 2012-11-28 DIAGNOSIS — I2699 Other pulmonary embolism without acute cor pulmonale: Secondary | ICD-10-CM

## 2012-11-28 DIAGNOSIS — R042 Hemoptysis: Secondary | ICD-10-CM | POA: Insufficient documentation

## 2012-11-28 DIAGNOSIS — Z720 Tobacco use: Secondary | ICD-10-CM

## 2012-11-28 DIAGNOSIS — F17200 Nicotine dependence, unspecified, uncomplicated: Secondary | ICD-10-CM

## 2012-11-28 MED ORDER — BUPROPION HCL ER (SR) 150 MG PO TB12: 150.0000 mg | ORAL_TABLET | Freq: Two times a day (BID) | ORAL | Status: DC

## 2012-11-28 NOTE — Assessment & Plan Note (Signed)
It is unclear to me whether or not this was idiopathic or provoked.  He states that he had been on a 3 hour car trip prior to the PE, but in years prior his job involved him driving 6+ hours to [facility] for work and he never had trouble.  Also, I worry about chronic thromboembolic disease and possibly pulmonary hypertension given his ongoing dyspnea and chest pain.    If he had an idiopathic PE or if he had CTEPH then he would need lifelong anticoagulation.  Plan: -perfusion study of lungs to look for chronic PE -Echo to look for pulmonary hypertension -life long anticoagulation, restart after bronchoscopy

## 2012-11-28 NOTE — Assessment & Plan Note (Signed)
He says that he has been coughing up blood (sometimes old, sometimes fresh) even off warfarin.  He has chronic daily cough (chronic bronchitis) due to smoking which is the likely culprit, but given the duration of his smoking and the symptoms, he needs to be evaluated for an endobronchial lesion.  Plan: -bronchoscopy Wednesday April 23 -restart warfarin afterwards
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
## 2012-11-28 NOTE — Patient Instructions (Signed)
We will have you get bloodwork prior to the bronchoscopy next week (CBC, PT/INR) We will have you get a perfusion study of your lungs at [facility] to evaluate for blood clots We will have you get an echocardiogram to look for strain on your heart  Take the well butrin 150mg  daily for three days, then increase to twice a day to help you quit smoking.  Use Delsym over the counter as directed to help you with the cough  We will call you to schedule the bronchoscopy.

## 2012-11-28 NOTE — Assessment & Plan Note (Signed)
Educated at length (>40min) to quit.  Started Wellbutrin.

## 2012-11-29 ENCOUNTER — Telehealth: Payer: Self-pay | Admitting: *Deleted

## 2012-11-29 LAB — CBC WITH DIFFERENTIAL/PLATELET
Basophils Absolute: 0.1 10*3/uL (ref 0.0–0.1)
Basophils Relative: 0.7 % (ref 0.0–3.0)
Eosinophils Relative: 2 % (ref 0.0–5.0)
HCT: 48.6 % (ref 39.0–52.0)
Hemoglobin: 16.4 g/dL (ref 13.0–17.0)
Lymphocytes Relative: 23.9 % (ref 12.0–46.0)
Lymphs Abs: 2.1 10*3/uL (ref 0.7–4.0)
Monocytes Relative: 18 % — ABNORMAL HIGH (ref 3.0–12.0)
Neutro Abs: 4.8 10*3/uL (ref 1.4–7.7)
RBC: 5.49 Mil/uL (ref 4.22–5.81)
RDW: 14.2 % (ref 11.5–14.6)
WBC: 8.8 10*3/uL (ref 4.5–10.5)

## 2012-11-29 NOTE — Telephone Encounter (Signed)
OK, please do

## 2012-11-29 NOTE — Telephone Encounter (Signed)
The [facility] lab called saying that they would like a re-draw for the PT/INR it was abnormally low

## 2012-11-30 NOTE — Telephone Encounter (Signed)
I called and spoke with Donald Brady. She wrote this down and will see who called.

## 2012-12-04 ENCOUNTER — Encounter (HOSPITAL_COMMUNITY): Payer: Self-pay | Admitting: Respiratory Therapy

## 2012-12-04 ENCOUNTER — Encounter (HOSPITAL_COMMUNITY): Payer: Self-pay | Admitting: Radiology

## 2012-12-05 ENCOUNTER — Encounter (HOSPITAL_COMMUNITY): Payer: Self-pay | Admitting: Radiology

## 2012-12-06 ENCOUNTER — Telehealth: Payer: Self-pay | Admitting: *Deleted

## 2012-12-06 ENCOUNTER — Encounter (HOSPITAL_COMMUNITY)
Admission: RE | Disposition: A | Discharge status: Home / Self Care | Payer: Self-pay | Source: Ambulatory Visit | Attending: Pulmonary Disease

## 2012-12-06 ENCOUNTER — Ambulatory Visit (HOSPITAL_COMMUNITY)
Admission: RE | Admit: 2012-12-06 | Discharge: 2012-12-06 | Disposition: A | Discharge status: Home / Self Care | Payer: No Typology Code available for payment source | Source: Ambulatory Visit | Attending: Pulmonary Disease | Admitting: Pulmonary Disease

## 2012-12-06 ENCOUNTER — Other Ambulatory Visit: Payer: Self-pay | Admitting: *Deleted

## 2012-12-06 ENCOUNTER — Ambulatory Visit (HOSPITAL_COMMUNITY): Payer: No Typology Code available for payment source

## 2012-12-06 DIAGNOSIS — R042 Hemoptysis: Secondary | ICD-10-CM

## 2012-12-06 DIAGNOSIS — I2699 Other pulmonary embolism without acute cor pulmonale: Secondary | ICD-10-CM

## 2012-12-06 DIAGNOSIS — Z7901 Long term (current) use of anticoagulants: Secondary | ICD-10-CM

## 2012-12-06 HISTORY — PX: VIDEO BRONCHOSCOPY: SHX5072

## 2012-12-06 SURGERY — VIDEO BRONCHOSCOPY WITHOUT FLUORO
Anesthesia: Moderate Sedation | Laterality: Bilateral

## 2012-12-06 SURGERY — VIDEO BRONCHOSCOPY WITHOUT FLUORO
Anesthesia: Moderate Sedation

## 2012-12-06 MED ORDER — MIDAZOLAM HCL 10 MG/2ML IJ SOLN
INTRAMUSCULAR | Status: DC | PRN
  Administered 2012-12-06: 2 mg via INTRAVENOUS

## 2012-12-06 MED ORDER — FENTANYL CITRATE 0.05 MG/ML IJ SOLN
INTRAMUSCULAR | Status: DC | PRN
  Administered 2012-12-06: 50 ug via INTRAVENOUS

## 2012-12-06 MED ORDER — HYDROCODONE-ACETAMINOPHEN 5-325 MG PO TABS: 2.0000 | ORAL_TABLET | Freq: Four times a day (QID) | ORAL | Status: DC | PRN

## 2012-12-06 MED ORDER — LIDOCAINE HCL 2 % EX GEL
CUTANEOUS | Status: DC | PRN
  Administered 2012-12-06: 1

## 2012-12-06 MED ORDER — PHENYLEPHRINE HCL 0.25 % NA SOLN
NASAL | Status: DC | PRN
  Administered 2012-12-06: 2 via NASAL

## 2012-12-06 MED ORDER — FENTANYL CITRATE 0.05 MG/ML IJ SOLN
INTRAMUSCULAR | Status: AC
  Filled 2012-12-06: qty 4

## 2012-12-06 MED ORDER — LIDOCAINE HCL 1 % IJ SOLN
INTRAMUSCULAR | Status: DC | PRN
  Administered 2012-12-06: 6 mL via RESPIRATORY_TRACT

## 2012-12-06 MED ORDER — MIDAZOLAM HCL 10 MG/2ML IJ SOLN
INTRAMUSCULAR | Status: AC
  Filled 2012-12-06: qty 4

## 2012-12-06 NOTE — Progress Notes (Signed)
[id number]  Patient to radiology for CXR via wheelchair

## 2012-12-06 NOTE — Op Note (Signed)
PCCM Bronchoscopy Procedure Note  The patient was informed of the risks (including but not limited to bleeding, infection, respiratory failure, lung injury, tooth/oral injury) and benefits of the procedure and gave consent, see chart.  Indication: Hemoptysis  Location: [facility]  Condition pre procedure: stable  Medications for procedure: Versed 2mg  IV, Fentanyl IV  Procedure description: The bronchoscope was introduced through the nose and passed to the bilateral lungs to the level of the subsegmental bronchi throughout the tracheobronchial tree.  Airway exam revealed some mild erythema and induration on vocal cords suggestive of reflux.  The remaining trachea and tracheobronchial tree was completely normal without erythema, inflammation, or evidence of endobronchial lesion or bleeding.  No sign of old or new blood was identified.  Procedures performed:  none  Specimens sent: none  Condition post procedure: Stable  Patient instructions: 2 View CXR today, f/u in clinic with me in two weeks  Yolonda Kida PCCM Pager: [phone] Cell: [phone] If no response, call [phone]

## 2012-12-06 NOTE — Telephone Encounter (Signed)
Pt has been scheduled to see BQ on 12/26/12 @ 9:15am.

## 2012-12-06 NOTE — Progress Notes (Signed)
Bronchoscopy w/ video intervention performed.  Airway exam intervention performed.  No labs were obtained.

## 2012-12-06 NOTE — H&P (Signed)
  I have seen and examined Donald Brady again this morning.  There has been no change to his exam or history since I last saw him one week ago.  The plan today is to proceed with bronchoscopy for an airway exam for his ongoing hemoptysis.

## 2012-12-06 NOTE — Telephone Encounter (Signed)
Vicodin rx called to [facility].

## 2012-12-06 NOTE — Telephone Encounter (Signed)
Message copied by Caryl Ada on Wed Dec 06, 2012 11:07 AM ------      Message from: Max Fickle B      Created: Wed Dec 06, 2012  8:00 AM       Please schedule a follow up appointment for me within 2-3 weeks.  Thanks,      Heber Friendly ------

## 2012-12-07 ENCOUNTER — Encounter (HOSPITAL_COMMUNITY): Payer: Self-pay | Admitting: Pulmonary Disease

## 2012-12-07 NOTE — Progress Notes (Signed)
Donald Brady [facility] PCCM Pager: [phone] Cell: [phone] If no response, call [phone]  

## 2012-12-26 ENCOUNTER — Ambulatory Visit: Payer: No Typology Code available for payment source | Admitting: Pulmonary Disease

## 2013-01-09 ENCOUNTER — Other Ambulatory Visit: Payer: Self-pay | Admitting: *Deleted

## 2013-01-09 ENCOUNTER — Other Ambulatory Visit: Payer: Self-pay | Admitting: Pulmonary Disease

## 2013-01-09 ENCOUNTER — Ambulatory Visit: Payer: No Typology Code available for payment source | Admitting: Pulmonary Disease

## 2013-01-09 ENCOUNTER — Telehealth: Payer: Self-pay | Admitting: Pulmonary Disease

## 2013-01-09 DIAGNOSIS — I2699 Other pulmonary embolism without acute cor pulmonale: Secondary | ICD-10-CM

## 2013-01-09 NOTE — Telephone Encounter (Signed)
ATC Donald Brady back-- she leaves at [facility] with Elmarie Shiley-- she is unaware of what exactly Donald Brady needed when she called I have left msg with Tiffany to have Donald Brady return call upon her arrival back in the office at 7am tomorrow

## 2013-01-09 NOTE — Telephone Encounter (Signed)
Last filled:4/23

## 2013-01-10 ENCOUNTER — Other Ambulatory Visit (HOSPITAL_COMMUNITY): Payer: No Typology Code available for payment source

## 2013-01-10 ENCOUNTER — Inpatient Hospital Stay (HOSPITAL_COMMUNITY): Admission: RE | Admit: 2013-01-10 | Payer: No Typology Code available for payment source | Source: Ambulatory Visit

## 2013-01-10 NOTE — Telephone Encounter (Signed)
I spoke with [place] and she states the pt bronch was rescheduled and she wanted to make sure the pt is aware. She states it is not set for friday at 9am. I called the pt and he is aware. Carron Curie, CMA

## 2013-01-11 ENCOUNTER — Other Ambulatory Visit (HOSPITAL_COMMUNITY): Payer: No Typology Code available for payment source

## 2013-01-12 ENCOUNTER — Other Ambulatory Visit: Payer: Self-pay

## 2013-01-12 ENCOUNTER — Other Ambulatory Visit (INDEPENDENT_AMBULATORY_CARE_PROVIDER_SITE_OTHER): Payer: No Typology Code available for payment source

## 2013-01-12 ENCOUNTER — Ambulatory Visit (HOSPITAL_COMMUNITY)
Admission: RE | Admit: 2013-01-12 | Discharge: 2013-01-12 | Disposition: A | Discharge status: Home / Self Care | Payer: No Typology Code available for payment source | Source: Ambulatory Visit | Attending: Pulmonary Disease | Admitting: Pulmonary Disease

## 2013-01-12 DIAGNOSIS — Z86711 Personal history of pulmonary embolism: Secondary | ICD-10-CM | POA: Insufficient documentation

## 2013-01-12 DIAGNOSIS — R059 Cough, unspecified: Secondary | ICD-10-CM | POA: Insufficient documentation

## 2013-01-12 DIAGNOSIS — R0602 Shortness of breath: Secondary | ICD-10-CM | POA: Insufficient documentation

## 2013-01-12 DIAGNOSIS — I2699 Other pulmonary embolism without acute cor pulmonale: Secondary | ICD-10-CM

## 2013-01-12 DIAGNOSIS — F17200 Nicotine dependence, unspecified, uncomplicated: Secondary | ICD-10-CM | POA: Insufficient documentation

## 2013-01-12 DIAGNOSIS — R05 Cough: Secondary | ICD-10-CM | POA: Insufficient documentation

## 2013-01-12 MED ORDER — TECHNETIUM TO 99M ALBUMIN AGGREGATED
5.0000 | Freq: Once | INTRAVENOUS | Status: AC | PRN
  Administered 2013-01-12: 5 via INTRAVENOUS

## 2013-01-12 MED ORDER — TECHNETIUM TC 99M DIETHYLENETRIAME-PENTAACETIC ACID: 40.0000 | Freq: Once | INTRAVENOUS | Status: DC | PRN

## 2013-01-12 MED ORDER — HYDROCODONE-ACETAMINOPHEN 5-325 MG PO TABS: 2.0000 | ORAL_TABLET | Freq: Four times a day (QID) | ORAL | Status: DC | PRN

## 2013-01-12 NOTE — Telephone Encounter (Signed)
[facility]ed [facility] 

## 2013-01-12 NOTE — Progress Notes (Signed)
Quick Note:  Spoke with pt and notified of results per Dr. McQuaid. Pt verbalized understanding and denied any questions.  ______ 

## 2013-01-16 ENCOUNTER — Other Ambulatory Visit: Payer: Self-pay | Admitting: Pulmonary Disease

## 2013-01-16 ENCOUNTER — Telehealth: Payer: Self-pay | Admitting: *Deleted

## 2013-01-16 DIAGNOSIS — R042 Hemoptysis: Secondary | ICD-10-CM

## 2013-01-16 NOTE — Telephone Encounter (Signed)
Pt has had his echo and perfusion test done. PFT has not been done.  Spoke to BQ about this today. He states that pt will need to have PFT done then schedule a ROV with him after it's done.  ATC the pt. Busy signal. Will try again later.

## 2013-01-16 NOTE — Telephone Encounter (Signed)
Message copied by Caryl Ada on Tue Jan 16, 2013  1:44 PM ------      Message from: Lupita Leash      Created: Sun Jan 07, 2013  3:05 PM      Regarding: Next visit       Hi L and L,            Note that I am sending this to both of you, so you may want to confer before acting.  Not sure who is with me next so sending to both.            I want to see Calen Geister back in the office again, but there is no point in doing that unless he has had a perfusion study and echo as I put in my last note.  He may also need full pfts if those are not available.  I can't see results from any of these.            He is supposed to come in the office on Tuesday May 27 pm (probably the day you are reading this).  Please reschedule this appointment and make sure that he has the echo, perfusion study, and pfts before I see him next.            Kipp Brood ------

## 2013-01-16 NOTE — Telephone Encounter (Signed)
Spoke to pt. He is aware that we will need to do PFT. This has been scheduled for 02/08/13 at 9am at [facility] office. ROV with BQ has been scheduled for 02/13/13 at 9am.  Nothing further was needed.

## 2013-01-18 NOTE — Progress Notes (Signed)
Quick Note:  Spoke with pt and notified of results per Dr. Wert. Pt verbalized understanding and denied any questions.  ______ 

## 2013-02-08 ENCOUNTER — Ambulatory Visit (INDEPENDENT_AMBULATORY_CARE_PROVIDER_SITE_OTHER): Payer: No Typology Code available for payment source | Admitting: Pulmonary Disease

## 2013-02-08 DIAGNOSIS — R042 Hemoptysis: Secondary | ICD-10-CM

## 2013-02-08 NOTE — Progress Notes (Signed)
PFT done today. 

## 2013-02-13 ENCOUNTER — Ambulatory Visit (INDEPENDENT_AMBULATORY_CARE_PROVIDER_SITE_OTHER): Payer: No Typology Code available for payment source | Admitting: Pulmonary Disease

## 2013-02-13 ENCOUNTER — Encounter: Payer: Self-pay | Admitting: Pulmonary Disease

## 2013-02-13 DIAGNOSIS — I2699 Other pulmonary embolism without acute cor pulmonale: Secondary | ICD-10-CM

## 2013-02-13 DIAGNOSIS — R0602 Shortness of breath: Secondary | ICD-10-CM | POA: Insufficient documentation

## 2013-02-13 NOTE — Progress Notes (Signed)
No show

## 2013-02-14 ENCOUNTER — Ambulatory Visit (INDEPENDENT_AMBULATORY_CARE_PROVIDER_SITE_OTHER): Payer: No Typology Code available for payment source | Admitting: Sports Medicine

## 2013-02-14 VITALS — BP 141/91 | Ht 75.0 in | Wt 261.0 lb

## 2013-02-14 DIAGNOSIS — M25562 Pain in left knee: Secondary | ICD-10-CM

## 2013-02-14 DIAGNOSIS — M1710 Unilateral primary osteoarthritis, unspecified knee: Secondary | ICD-10-CM

## 2013-02-14 DIAGNOSIS — M25569 Pain in unspecified knee: Secondary | ICD-10-CM

## 2013-02-14 DIAGNOSIS — M1712 Unilateral primary osteoarthritis, left knee: Secondary | ICD-10-CM

## 2013-02-14 MED ORDER — METHYLPREDNISOLONE ACETATE 40 MG/ML IJ SUSP
80.0000 mg | Freq: Once | INTRAMUSCULAR | Status: AC
  Administered 2013-02-14: 80 mg via INTRA_ARTICULAR

## 2013-02-14 NOTE — Progress Notes (Signed)
  Subjective:    Patient ID: Donald Brady, male    DOB: 05-24-1976, 37 y.o.   MRN: [id number]  HPI Patient comes in today complaining of returning left knee pain and swelling. Knee was last injected about 3 months ago. I provided him with 2 months of symptom relief. No new trauma. No mechanical symptoms. Symptoms are worse with standing.    Review of Systems     Objective:   Physical Exam Well-developed overweight no acute distress  Left knee: Range of motion 0-120. Trace effusion. Slight tenderness to palpation along medial lateral joint lines but a negative McMurray's. Good ligamentous stability. Neurovascularly intact distally. Walking with a slight limp.       Assessment & Plan:  1. 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 I will inject him with a 2:6 Depo-Medrol/Xylocaine combination. An anterior lateral approach was utilized. The x-rays done a few months ago were not standing. Therefore, I will reorder these x-rays with the patient standing including a 30 flexion view. My suspicion is that the patient has more pronounced arthritis than his previous x-rays have demonstrated. We again discussed weight loss and how it pertains to the overall health of his knee. He tells me that he has lost 7 pounds in 2 weeks. I will call him after I review these most recent x-rays.  Consent obtained and verified. Time-out conducted. Noted no overlying erythema, induration, or other signs of local infection. Skin prepped in a sterile fashion. Topical analgesic spray: Ethyl chloride. Joint: left knee Needle: 22g 1 1/2 inch Completed without difficulty. Meds: 2cc (80mg ) depomedrol, 6cc 1% xylocaine  Advised to call if fevers/chills, erythema, induration, drainage, or persistent bleeding.

## 2013-02-20 ENCOUNTER — Ambulatory Visit (INDEPENDENT_AMBULATORY_CARE_PROVIDER_SITE_OTHER): Payer: No Typology Code available for payment source | Admitting: Pulmonary Disease

## 2013-02-20 VITALS — BP 130/86 | HR 100 | Ht 75.0 in | Wt 359.0 lb

## 2013-02-20 DIAGNOSIS — Z720 Tobacco use: Secondary | ICD-10-CM

## 2013-02-20 DIAGNOSIS — J4520 Mild intermittent asthma, uncomplicated: Secondary | ICD-10-CM

## 2013-02-20 DIAGNOSIS — F17200 Nicotine dependence, unspecified, uncomplicated: Secondary | ICD-10-CM

## 2013-02-20 DIAGNOSIS — R042 Hemoptysis: Secondary | ICD-10-CM

## 2013-02-20 DIAGNOSIS — I2699 Other pulmonary embolism without acute cor pulmonale: Secondary | ICD-10-CM

## 2013-02-20 DIAGNOSIS — J45909 Unspecified asthma, uncomplicated: Secondary | ICD-10-CM | POA: Insufficient documentation

## 2013-02-20 MED ORDER — ALBUTEROL SULFATE HFA 108 (90 BASE) MCG/ACT IN AERS: 2.0000 | INHALATION_SPRAY | RESPIRATORY_TRACT | Status: DC | PRN

## 2013-02-20 NOTE — Assessment & Plan Note (Signed)
Advised again today to quit.

## 2013-02-20 NOTE — Assessment & Plan Note (Addendum)
April 2014 bronchoscopy was normal  This problem has resolved and there was never any clear etiology.  Continue to monitor.

## 2013-02-20 NOTE — Patient Instructions (Signed)
Use albuterol as needed for shortness of breath (2 puffs) You can also use albuterol ten minutes before exercise to help with shortness of breath  Use the Flunisolide inhaler one puff twice a day for a month.  If you think it is helping with your breathing then call us so we can call in a prescription for you  Start taking the coumadin again  We will see you back in 6 months or sooner if needed

## 2013-02-20 NOTE — Assessment & Plan Note (Signed)
Based on the size of his previous PE and the fact that there appeared to be a chronic component, he needs to be on chronic anticoagulation. Amazingly despite me telling him to do this multiple times he still has started on warfarin.  Plan: -He plans to visit the Coumadin clinic within a week to start warfarin therapy -Full anticoagulation for life

## 2013-02-20 NOTE — Assessment & Plan Note (Signed)
Claron's PFT's showed a marked improvement with bronchodilator which in combination with his intermittent chest tightness, mucus production, and dyspnea means that he likely has asthma.  At this point I would characterize it as mild intermittent/exercise induced.  Interestingly, he does not have a strong atopic phenotype.  His obesity is likely contributing (and causes concomitant restrictive disease).  Plan: -continue exercise and lose weight -start prn albuterol (HFA use instructions given) -start trial of low dose flunisolide -f/u 6 months
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
## 2013-02-27 ENCOUNTER — Telehealth: Payer: Self-pay | Admitting: *Deleted

## 2013-02-27 DIAGNOSIS — I2699 Other pulmonary embolism without acute cor pulmonale: Secondary | ICD-10-CM

## 2013-02-27 NOTE — Telephone Encounter (Signed)
Pt requesting refill on vicodin 5/325 mg # 120 Last filled 01/12/13 This is not on med list

## 2013-02-28 MED ORDER — HYDROCODONE-ACETAMINOPHEN 5-325 MG PO TABS: 2.0000 | ORAL_TABLET | Freq: Four times a day (QID) | ORAL | Status: DC | PRN

## 2013-02-28 NOTE — Telephone Encounter (Signed)
Read Dr Eliane Decree last note from March. [place] narc database and OK. No contract even though seems on chronic opioids. No UDS. Will refill once. Needs appt with PCP in next 30 days (if PCP not available, with [facility] res).

## 2013-02-28 NOTE — Telephone Encounter (Signed)
Rx called in 

## 2013-03-26 ENCOUNTER — Ambulatory Visit (INDEPENDENT_AMBULATORY_CARE_PROVIDER_SITE_OTHER): Payer: No Typology Code available for payment source | Admitting: Internal Medicine

## 2013-03-26 ENCOUNTER — Encounter: Payer: Self-pay | Admitting: Internal Medicine

## 2013-03-26 VITALS — BP 133/82 | HR 95 | Temp 97.5°F | Ht 75.0 in | Wt 366.9 lb

## 2013-03-26 DIAGNOSIS — F17200 Nicotine dependence, unspecified, uncomplicated: Secondary | ICD-10-CM

## 2013-03-26 DIAGNOSIS — Z7901 Long term (current) use of anticoagulants: Secondary | ICD-10-CM

## 2013-03-26 DIAGNOSIS — E669 Obesity, unspecified: Secondary | ICD-10-CM

## 2013-03-26 DIAGNOSIS — M1712 Unilateral primary osteoarthritis, left knee: Secondary | ICD-10-CM

## 2013-03-26 DIAGNOSIS — Z720 Tobacco use: Secondary | ICD-10-CM

## 2013-03-26 DIAGNOSIS — M1710 Unilateral primary osteoarthritis, unspecified knee: Secondary | ICD-10-CM

## 2013-03-26 DIAGNOSIS — G8929 Other chronic pain: Secondary | ICD-10-CM

## 2013-03-26 DIAGNOSIS — IMO0002 Reserved for concepts with insufficient information to code with codable children: Secondary | ICD-10-CM

## 2013-03-26 DIAGNOSIS — I2699 Other pulmonary embolism without acute cor pulmonale: Secondary | ICD-10-CM

## 2013-03-26 DIAGNOSIS — R0781 Pleurodynia: Secondary | ICD-10-CM

## 2013-03-26 DIAGNOSIS — R071 Chest pain on breathing: Secondary | ICD-10-CM

## 2013-03-26 LAB — CBC
HCT: 47.8 % (ref 39.0–52.0)
Hemoglobin: 16.8 g/dL (ref 13.0–17.0)
MCV: 85.1 fL (ref 78.0–100.0)
WBC: 8.7 10*3/uL (ref 4.0–10.5)

## 2013-03-26 LAB — COMPLETE METABOLIC PANEL WITH GFR
Albumin: 3.9 g/dL (ref 3.5–5.2)
BUN: 13 mg/dL (ref 6–23)
CO2: 25 mEq/L (ref 19–32)
Calcium: 9.2 mg/dL (ref 8.4–10.5)
Chloride: 107 mEq/L (ref 96–112)
Creat: 1.52 mg/dL — ABNORMAL HIGH (ref 0.50–1.35)
GFR, Est African American: 67 mL/min
GFR, Est Non African American: 58 mL/min — ABNORMAL LOW
Glucose, Bld: 137 mg/dL — ABNORMAL HIGH (ref 70–99)
Potassium: 4.2 mEq/L (ref 3.5–5.3)

## 2013-03-26 MED ORDER — GABAPENTIN 100 MG PO CAPS: 100.0000 mg | ORAL_CAPSULE | Freq: Three times a day (TID) | ORAL | Status: DC

## 2013-03-26 MED ORDER — FLUNISOLIDE HFA 80 MCG/ACT IN AERS: 1.0000 | INHALATION_SPRAY | Freq: Two times a day (BID) | RESPIRATORY_TRACT | Status: DC
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
## 2013-03-26 NOTE — Assessment & Plan Note (Addendum)
Pt has not been on anticoagulation since 2/14 due to financial constraints.  We discussed the importance of lifelong anticoagulation for him as well as the necessity of smoking cessation.   Follow-up with Dr. Alexandria Lodge in 2 weeks for discussion of coumadin vs. rivaroxiban therapy.

## 2013-03-26 NOTE — Assessment & Plan Note (Addendum)
Assessment: Recent PFTs with pulmonology. Pt diagnosed with mild intermittent/exercise induced asthma with contribution from obesity.   Plan:  -continue current meds- albuterol, flunisolide -refilled flunisolide today -follow-up with pulmonology, appreciate recs -encouraged weight loss, smoking cessation

## 2013-03-26 NOTE — Assessment & Plan Note (Addendum)
Encouraged weight loss today.  Pt states he wants to lose weight but is unable to exercise right now secondary to pain.   -once pain is controlled, will refer pt to dietician  ADDENDUM: Check A1C at next visit given elevated glucose x 2

## 2013-03-26 NOTE — Assessment & Plan Note (Addendum)
Assessment:  Pt has significant pleuritic chest pain which he describes as "pins in his lungs", he states there has been no change in this pain since his bilateral PE in 4/13.  He had a negative VQ scan in 5/14.  Vicodin was relieving pain previously, but pt states he was getting no relief from this medication after it was refilled last month thus he self-discontinued it approximately 2 weeks ago.  Pt states he is unable to do his ADLs or work due to this pain.   Plan: -CBC, CMP today -re-start Vicodin 5-325 mg 2 tablets q6h; pt was instructed not to take more than the prescribed dose -start gabapentin 100mg  TID, emphasized importance of not taking more than prescribed of this medication as well -pain clinic referral  -follow-up in 2 weeks after pt has been taking both Vicodin and gabapentin for that time period; pt will likely need to sign a pain contract at that time -UDS at next visit  ADDENDUM: Pain clinic states that pt not appropriate for their clinic

## 2013-03-26 NOTE — Assessment & Plan Note (Signed)
Pt smoking about 1 pack per week.  Encouraged smoking cessation, offered to prescribe nicotine gum or patches which pt declined because those "make him feel weird."  Pt has tried bupropion and Chantix in past.  His neighbor bought him an electronic cigarette which he is using and says helps.    -Will re-address at follow-up visit.

## 2013-03-26 NOTE — Assessment & Plan Note (Addendum)
Pt is following with sports med and received last cortisone injection on 02/14/13; injection provided 2 months of relief previously.  He has some knee pain with ambulation today.  He should also get relief from Vicodin prescribed for his pleuritic pain.  Emphasized the importance of weight loss for this issue as well.

## 2013-03-26 NOTE — Patient Instructions (Addendum)
Follow-up in 2 weeks with Dr. Aundria Rud and Dr. Alexandria Lodge.   Please take your pain medications exactly as prescribed, it is very important not to take more!  We will see you back in 2 weeks and will address your pain again at that time.  We are also going to send you to the pain clinic, they will call you with the appointment time.   Keep working on quitting smoking!  Quitting should help your pain as well.    Gabapentin capsules or tablets What is this medicine? GABAPENTIN (GA ba pen tin) is used to control partial seizures in adults with epilepsy. It is also used to treat certain types of nerve pain. This medicine may be used for other purposes; ask your health care provider or pharmacist if you have questions. What should I tell my health care provider before I take this medicine? They need to know if you have any of these conditions: -kidney disease -suicidal thoughts, plans, or attempt; a previous suicide attempt by you or a family member -an unusual or allergic reaction to gabapentin, other medicines, foods, dyes, or preservatives -pregnant or trying to get pregnant -breast-feeding How should I use this medicine? Take this medicine by mouth. Swallow it with a drink of water. Follow the directions on the prescription label. If this medicine upsets your stomach, take it with food or milk. Take your medicine at regular intervals. Do not take it more often than directed. If you are directed to break the 600 or 800 mg tablets in half as part of your dose, the extra half tablet should be used for the next dose. If you have not used the extra half tablet within 3 days, it should be thrown away. A special MedGuide will be given to you by the pharmacist with each prescription and refill. Be sure to read this information carefully each time. Talk to your pediatrician regarding the use of this medicine in children. Special care may be needed. Overdosage: If you think you have taken too much of this  medicine contact a poison control center or emergency room at once. NOTE: This medicine is only for you. Do not share this medicine with others. What if I miss a dose? If you miss a dose, take it as soon as you can. If it is almost time for your next dose, take only that dose. Do not take double or extra doses. What may interact with this medicine? -antacids -hydrocodone -morphine -naproxen -sevelamer This list may not describe all possible interactions. Give your health care provider a list of all the medicines, herbs, non-prescription drugs, or dietary supplements you use. Also tell them if you smoke, drink alcohol, or use illegal drugs. Some items may interact with your medicine. What should I watch for while using this medicine? Visit your doctor or health care professional for regular checks on your progress. You may want to keep a record at home of how you feel your condition is responding to treatment. You may want to share this information with your doctor or health care professional at each visit. You should contact your doctor or health care professional if your seizures get worse or if you have any new types of seizures. Do not stop taking this medicine or any of your seizure medicines unless instructed by your doctor or health care professional. Stopping your medicine suddenly can increase your seizures or their severity. Wear a medical identification bracelet or chain if you are taking this medicine for seizures, and carry  a card that lists all your medications. You may get drowsy, dizzy, or have blurred vision. Do not drive, use machinery, or do anything that needs mental alertness until you know how this medicine affects you. To reduce dizzy or fainting spells, do not sit or stand up quickly, especially if you are an older patient. Alcohol can increase drowsiness and dizziness. Avoid alcoholic drinks. Your mouth may get dry. Chewing sugarless gum or sucking hard candy, and drinking  plenty of water will help. The use of this medicine may increase the chance of suicidal thoughts or actions. Pay special attention to how you are responding while on this medicine. Any worsening of mood, or thoughts of suicide or dying should be reported to your health care professional right away. Women who become pregnant while using this medicine may enroll in the [place] American Antiepileptic Drug Pregnancy Registry by calling ([phone]. This registry collects information about the safety of antiepileptic drug use during pregnancy. What side effects may I notice from receiving this medicine? Side effects that you should report to your doctor or health care professional as soon as possible: -allergic reactions like skin rash, itching or hives, swelling of the face, lips, or tongue -worsening of mood, thoughts or actions of suicide or dying Side effects that usually do not require medical attention (report to your doctor or health care professional if they continue or are bothersome): -constipation -difficulty walking or controlling muscle movements -nausea -slurred speech -tremors -weight gain This list may not describe all possible side effects. Call your doctor for medical advice about side effects. You may report side effects to FDA at 1-800-FDA-1088. Where should I keep my medicine? Keep out of reach of children. Store at room temperature between 15 and 30 degrees C (59 and 86 degrees F). Throw away any unused medicine after the expiration date. NOTE: This sheet is a summary. It may not cover all possible information. If you have questions about this medicine, talk to your doctor, pharmacist, or health care provider.  2012, Elsevier/Gold Standard. (03/31/2010 6:06:26 PM)

## 2013-03-27 NOTE — Progress Notes (Signed)
I saw and evaluated the patient.  I personally confirmed the key portions of the history and exam documented by Dr. Rogers and I reviewed pertinent patient test results.  The assessment, diagnosis, and plan were formulated together and I agree with the documentation in the resident's note. 

## 2013-04-02 NOTE — Addendum Note (Signed)
Addended by: Leanora Cover on: 04/02/2013 03:42 PM   Modules accepted: Orders

## 2013-04-05 NOTE — Addendum Note (Signed)
Addended by: Neomia Dear on: 04/05/2013 06:10 PM   Modules accepted: Orders

## 2013-06-04 ENCOUNTER — Ambulatory Visit (INDEPENDENT_AMBULATORY_CARE_PROVIDER_SITE_OTHER): Payer: No Typology Code available for payment source | Admitting: *Deleted

## 2013-06-04 ENCOUNTER — Ambulatory Visit: Payer: No Typology Code available for payment source

## 2013-06-04 ENCOUNTER — Other Ambulatory Visit: Payer: Self-pay | Admitting: *Deleted

## 2013-06-04 DIAGNOSIS — I2699 Other pulmonary embolism without acute cor pulmonale: Secondary | ICD-10-CM

## 2013-06-04 DIAGNOSIS — Z23 Encounter for immunization: Secondary | ICD-10-CM

## 2013-06-04 MED ORDER — FLUNISOLIDE HFA 80 MCG/ACT IN AERS: 1.0000 | INHALATION_SPRAY | Freq: Two times a day (BID) | RESPIRATORY_TRACT | Status: DC

## 2013-06-04 MED ORDER — HYDROCODONE-ACETAMINOPHEN 5-325 MG PO TABS: 2.0000 | ORAL_TABLET | Freq: Four times a day (QID) | ORAL | Status: DC | PRN

## 2013-06-04 NOTE — Telephone Encounter (Signed)
Last filled 7/16 with understanding pt would see PCP. He did in Aug and 2 week appt requested but never given. Pt given Oct 22 appt which he has had to change to Dec. Since he used 120 in 3 months, will refill. Today's Rx should last until Dec appt

## 2013-06-04 NOTE — Telephone Encounter (Signed)
Pt aware.

## 2013-06-06 ENCOUNTER — Ambulatory Visit: Payer: Self-pay

## 2013-06-26 ENCOUNTER — Ambulatory Visit: Payer: No Typology Code available for payment source | Admitting: Internal Medicine

## 2013-06-26 ENCOUNTER — Encounter: Payer: Self-pay | Admitting: Internal Medicine

## 2013-07-16 ENCOUNTER — Other Ambulatory Visit: Payer: Self-pay | Admitting: *Deleted

## 2013-07-16 DIAGNOSIS — I2699 Other pulmonary embolism without acute cor pulmonale: Secondary | ICD-10-CM

## 2013-07-16 NOTE — Telephone Encounter (Signed)
Call when ready @ [phone]

## 2013-07-17 ENCOUNTER — Other Ambulatory Visit: Payer: Self-pay | Admitting: Internal Medicine

## 2013-07-17 DIAGNOSIS — I2699 Other pulmonary embolism without acute cor pulmonale: Secondary | ICD-10-CM

## 2013-07-17 MED ORDER — HYDROCODONE-ACETAMINOPHEN 5-325 MG PO TABS: 2.0000 | ORAL_TABLET | Freq: Four times a day (QID) | ORAL | Status: DC | PRN

## 2013-07-17 MED ORDER — HYDROCODONE-ACETAMINOPHEN 5-325 MG PO TABS: 1.0000 | ORAL_TABLET | Freq: Four times a day (QID) | ORAL | Status: DC | PRN

## 2013-07-19 ENCOUNTER — Other Ambulatory Visit: Payer: Self-pay | Admitting: Internal Medicine

## 2013-07-19 DIAGNOSIS — I2699 Other pulmonary embolism without acute cor pulmonale: Secondary | ICD-10-CM

## 2013-07-19 MED ORDER — HYDROCODONE-ACETAMINOPHEN 5-325 MG PO TABS: 1.0000 | ORAL_TABLET | Freq: Four times a day (QID) | ORAL | Status: DC | PRN

## 2013-07-23 ENCOUNTER — Ambulatory Visit (INDEPENDENT_AMBULATORY_CARE_PROVIDER_SITE_OTHER): Payer: No Typology Code available for payment source | Admitting: Sports Medicine

## 2013-07-23 ENCOUNTER — Encounter: Payer: Self-pay | Admitting: Sports Medicine

## 2013-07-23 VITALS — BP 155/90 | Ht 75.0 in | Wt 375.0 lb

## 2013-07-23 DIAGNOSIS — M1712 Unilateral primary osteoarthritis, left knee: Secondary | ICD-10-CM

## 2013-07-23 DIAGNOSIS — M1710 Unilateral primary osteoarthritis, unspecified knee: Secondary | ICD-10-CM

## 2013-07-23 MED ORDER — METHYLPREDNISOLONE ACETATE 40 MG/ML IJ SUSP
40.0000 mg | Freq: Once | INTRAMUSCULAR | Status: AC
  Administered 2013-07-23: 80 mg via INTRA_ARTICULAR

## 2013-07-23 NOTE — Progress Notes (Signed)
   Subjective:    Patient ID: Donald Brady, male    DOB: 11-Sep-1975, 37 y.o.   MRN: [id number]  HPI chief complaint: Left knee pain  Patient comes in today requesting a repeat cortisone injection into his left knee. He has a documented history of left knee DJD. X-rays done a year ago showed mild to moderate medial compartmental narrowing but these were nonweightbearing films. I ordered weightbearing x-rays of his knee during his last visit but the patient failed to get these done. Last office visit was in July and I injected his knee with cortisone at that time. It worked very well up until a few weeks ago. Pain has now returned and is identical in nature to what he's experienced previously. He has noticed swelling both in the anterior and posterior knee. Symptoms are worse with weightbearing.    Review of Systems     Objective:   Physical Exam Obese, no acute distress  Left knee: Range of motion 0-120. Trace effusion. Tender to palpation along the medial joint line but a negative McMurray's. Palpable Baker's cyst in the popliteal fossa. Knee is stable to ligamentous exam. Neurovascularly intact distally. Walking with a limp.       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 A 2:6 corticosteroid preparation was injected using an anterior lateral approach. Patient tolerated this without difficulty. He reassures me that he will get updated knee x-rays done sometime later this week. Followup when necessary.

## 2013-08-03 ENCOUNTER — Encounter: Payer: Self-pay | Admitting: Licensed Clinical Social Worker

## 2013-08-03 ENCOUNTER — Encounter: Payer: Self-pay | Admitting: Internal Medicine

## 2013-08-03 ENCOUNTER — Other Ambulatory Visit: Payer: Self-pay | Admitting: *Deleted

## 2013-08-03 ENCOUNTER — Ambulatory Visit (INDEPENDENT_AMBULATORY_CARE_PROVIDER_SITE_OTHER): Payer: No Typology Code available for payment source | Admitting: Internal Medicine

## 2013-08-03 ENCOUNTER — Ambulatory Visit
Admission: RE | Admit: 2013-08-03 | Discharge: 2013-08-03 | Disposition: A | Discharge status: Home / Self Care | Payer: No Typology Code available for payment source | Source: Ambulatory Visit | Attending: Sports Medicine | Admitting: Sports Medicine

## 2013-08-03 VITALS — BP 158/107 | HR 97 | Temp 98.3°F | Ht 75.0 in | Wt 389.7 lb

## 2013-08-03 DIAGNOSIS — Z720 Tobacco use: Secondary | ICD-10-CM

## 2013-08-03 DIAGNOSIS — M1712 Unilateral primary osteoarthritis, left knee: Secondary | ICD-10-CM

## 2013-08-03 DIAGNOSIS — F17200 Nicotine dependence, unspecified, uncomplicated: Secondary | ICD-10-CM

## 2013-08-03 DIAGNOSIS — I10 Essential (primary) hypertension: Secondary | ICD-10-CM

## 2013-08-03 DIAGNOSIS — F411 Generalized anxiety disorder: Secondary | ICD-10-CM

## 2013-08-03 DIAGNOSIS — E669 Obesity, unspecified: Secondary | ICD-10-CM

## 2013-08-03 DIAGNOSIS — R739 Hyperglycemia, unspecified: Secondary | ICD-10-CM

## 2013-08-03 DIAGNOSIS — R071 Chest pain on breathing: Secondary | ICD-10-CM

## 2013-08-03 DIAGNOSIS — R7309 Other abnormal glucose: Secondary | ICD-10-CM

## 2013-08-03 DIAGNOSIS — M1710 Unilateral primary osteoarthritis, unspecified knee: Secondary | ICD-10-CM

## 2013-08-03 DIAGNOSIS — I2699 Other pulmonary embolism without acute cor pulmonale: Secondary | ICD-10-CM

## 2013-08-03 DIAGNOSIS — R0781 Pleurodynia: Secondary | ICD-10-CM

## 2013-08-03 LAB — POCT GLYCOSYLATED HEMOGLOBIN (HGB A1C): Hemoglobin A1C: 5.7

## 2013-08-03 LAB — GLUCOSE, CAPILLARY: Glucose-Capillary: 134 mg/dL — ABNORMAL HIGH (ref 70–99)

## 2013-08-03 MED ORDER — HYDROCHLOROTHIAZIDE 12.5 MG PO CAPS: 12.5000 mg | ORAL_CAPSULE | Freq: Every day | ORAL | Status: DC

## 2013-08-03 MED ORDER — HYDROCODONE-ACETAMINOPHEN 5-325 MG PO TABS: 1.0000 | ORAL_TABLET | Freq: Four times a day (QID) | ORAL | Status: DC | PRN

## 2013-08-03 MED ORDER — GABAPENTIN 100 MG PO CAPS
100.0000 mg | ORAL_CAPSULE | Freq: Three times a day (TID) | ORAL | Status: DC
Start: 2013-08-03 — End: 2013-09-17

## 2013-08-03 MED ORDER — SERTRALINE HCL 25 MG PO TABS: 25.0000 mg | ORAL_TABLET | Freq: Every day | ORAL | Status: DC

## 2013-08-03 NOTE — Assessment & Plan Note (Signed)
Has appt with Dr. Alexandria Lodge on 08/20/13 to discuss anticoagulation.  Should now have resources he needs for medication assistance.

## 2013-08-03 NOTE — Patient Instructions (Signed)
Please follow-up with Dr. Aundria Rud in February.  Please follow-up with Dr. Alexandria Lodge (Coumadin clinic) and Norm Parcel (dietician) in early January.   We are having you meet with the social worker today to see if she can help you with medication pricing and a temporary handicapped sticker.   Your A1C is good for now which means no diabetes!    Calorie Counting Diet A calorie counting diet requires you to eat the number of calories that are right for you in a day. Calories are the measurement of how much energy you get from the food you eat. Eating the right amount of calories is important for staying at a healthy weight. If you eat too many calories, your body will store them as fat and you may gain weight. If you eat too few calories, you may lose weight. Counting the number of calories you eat during a day will help you know if you are eating the right amount. A Registered Dietitian can determine how many calories you need in a day. The amount of calories needed varies from person to person. If your goal is to lose weight, you will need to eat fewer calories. Losing weight can benefit you if you are overweight or have health problems such as heart disease, high blood pressure, or diabetes. If your goal is to gain weight, you will need to eat more calories. Gaining weight may be necessary if you have a certain health problem that causes your body to need more energy. TIPS Whether you are increasing or decreasing the number of calories you eat during a day, it may be hard to get used to changes in what you eat and drink. The following are tips to help you keep track of the number of calories you eat.  Measure foods at home with measuring cups. This helps you know the amount of food and number of calories you are eating.  Restaurants often serve food in amounts that are larger than 1 serving. While eating out, estimate how many servings of a food you are given. For example, a serving of cooked rice is   cup or about the size of half of a fist. Knowing serving sizes will help you be aware of how much food you are eating at restaurants.  Ask for smaller portion sizes or child-size portions at restaurants.  Plan to eat half of a meal at a restaurant. Take the rest home or share the other half with a friend.  Read the Nutrition Facts panel on food labels for calorie content and serving size. You can find out how many servings are in a package, the size of a serving, and the number of calories each serving has.  For example, a package might contain 3 cookies. The Nutrition Facts panel on that package says that 1 serving is 1 cookie. Below that, it will say there are 3 servings in the container. The calories section of the Nutrition Facts label says there are 90 calories. This means there are 90 calories in 1 cookie (1 serving). If you eat 1 cookie you have eaten 90 calories. If you eat all 3 cookies, you have eaten 270 calories (3 servings x 90 calories = 270 calories). The list below tells you how big or small some common portion sizes are.  1 oz.........4 stacked dice.  3 oz........Marland KitchenDeck of cards.  1 tsp.......Marland KitchenTip of little finger.  1 tbs......Marland KitchenMarland KitchenThumb.  2 tbs.......Marland KitchenGolf ball.   cup......Marland KitchenHalf of a fist.  1 cup.......Marland KitchenA fist. KEEP  A FOOD LOG Write down every food item you eat, the amount you eat, and the number of calories in each food you eat during the day. At the end of the day, you can add up the total number of calories you have eaten. It may help to keep a list like the one below. Find out the calorie information by reading the Nutrition Facts panel on food labels. Breakfast  Bran cereal (1 cup, 110 calories).  Fat-free milk ( cup, 45 calories). Snack  Apple (1 medium, 80 calories). Lunch  Spinach (1 cup, 20 calories).  Tomato ( medium, 20 calories).  Chicken breast strips (3 oz, 165 calories).  Shredded cheddar cheese ( cup, 110 calories).  Light [place] dressing  (2 tbs, 60 calories).  Whole-wheat bread (1 slice, 80 calories).  Tub margarine (1 tsp, 35 calories).  Vegetable soup (1 cup, 160 calories). Dinner  Pork chop (3 oz, 190 calories).  Brown rice (1 cup, 215 calories).  Steamed broccoli ( cup, 20 calories).  Strawberries (1  cup, 65 calories).  Whipped cream (1 tbs, 50 calories). Daily Calorie Total: 1425 Document Released: 08/02/2005 Document Revised: 10/25/2011 Document Reviewed: 01/27/2007 [facility] Patient Information 2014 [place], [place].   Exercise to Lose Weight Exercise and a healthy diet may help you lose weight. Your doctor may suggest specific exercises. EXERCISE IDEAS AND TIPS  Choose low-cost things you enjoy doing, such as walking, bicycling, or exercising to workout videos.  Take stairs instead of the elevator.  Walk during your lunch break.  Park your car further away from work or school.  Go to a gym or an exercise class.  Start with 5 to 10 minutes of exercise each day. Build up to 30 minutes of exercise 4 to 6 days a week.  Wear shoes with good support and comfortable clothes.  Stretch before and after working out.  Work out until you breathe harder and your heart beats faster.  Drink extra water when you exercise.  Do not do so much that you hurt yourself, feel dizzy, or get very short of breath. Exercises that burn about 150 calories:  Running 1  miles in 15 minutes.  Playing volleyball for 45 to 60 minutes.  Washing and waxing a car for 45 to 60 minutes.  Playing touch football for 45 minutes.  Walking 1  miles in 35 minutes.  Pushing a stroller 1  miles in 30 minutes.  Playing basketball for 30 minutes.  Raking leaves for 30 minutes.  Bicycling 5 miles in 30 minutes.  Walking 2 miles in 30 minutes.  Dancing for 30 minutes.  Shoveling snow for 15 minutes.  Swimming laps for 20 minutes.  Walking up stairs for 15 minutes.  Bicycling 4 miles in 15 minutes.  Gardening  for 30 to 45 minutes.  Jumping rope for 15 minutes.  Washing windows or floors for 45 to 60 minutes. Document Released: 09/04/2010 Document Revised: 10/25/2011 Document Reviewed: 09/04/2010 [facility] Patient Information 2014 [place], [place].

## 2013-08-03 NOTE — Progress Notes (Signed)
[name] was referred to CSW as pt is having difficulty affording his medication. Pt has the [facility] card but was unaware of the benefits at the [employer].  CSW provided [name] with information on [facility] and MAP to obtain medications for $6 and utilize [employer].  Pt's current medications are able to be obtained through [employer], $4 med, or MAP.  Pt states he plan to gather needed documents for MAP.  [name] aware CSW is available to assist as needed.
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
## 2013-08-03 NOTE — Assessment & Plan Note (Signed)
Patient saw sports medicine on 12/8 and had steroid injection at that time which he states improved the pain but "it would probably be better with some weight loss."  Injection provided 2 months of relief previously.  Encouraged weight loss today, patient has appt with nutritonist Lupita Leash Plyler on 08/20/13.

## 2013-08-03 NOTE — Assessment & Plan Note (Signed)
History of bilateral PE but not on anticoagulation since 09/2012.  Pain since but improved on regular Vicodin dosing since last visit.  Patient signed pain contract today.  Increased his prescription to Vicodin 5-325 mg 2 tabs q6h prn pain #180 per month today, went ahead and gave him prescription to be filled next week today due to transportation issues.  If UDS inappropriate, will call pharmacy to cancel.  Reprinted rx for gabapentin 100 mg TID as patient never filled after last visit due to financial constraints.  Pain clinic unwilling to see patient.   -UDS today  -Vicodin and gabapentin prescriptions per above, readdress at follow-up  -temporary handicapped (6 months) form completed today as patient is only able to ambulate short distances (likely due to deconditioning)  -patient met with Lynnae January today to discuss medication assistance programs

## 2013-08-03 NOTE — Assessment & Plan Note (Signed)
Encouraged weight loss again today.  Patient is willing to meet with Lupita Leash Plyler to discuss weight loss and nutrition.  Appt scheduled for 1/5.  Also checked A1C today due to recent elevated blood glucoses --> 5.7%.

## 2013-08-03 NOTE — Assessment & Plan Note (Signed)
Encouraged smoking cessation and provided materials today.  Patient had quit cold [place] after last visit but restarted 3 weeks ago due to stress (currently smoking 2 packs/week).  Patient not interested in pharmacologic therapy.  -readdress at follow-up appt

## 2013-08-04 DIAGNOSIS — I10 Essential (primary) hypertension: Secondary | ICD-10-CM | POA: Insufficient documentation

## 2013-08-04 DIAGNOSIS — F411 Generalized anxiety disorder: Secondary | ICD-10-CM | POA: Insufficient documentation

## 2013-08-04 LAB — PRESCRIPTION ABUSE MONITORING 15P, URINE
Amphetamine/Meth: NEGATIVE ng/mL
Barbiturate Screen, Urine: NEGATIVE ng/mL
Buprenorphine, Urine: NEGATIVE ng/mL
Carisoprodol, Urine: NEGATIVE ng/mL
Cocaine Metabolites: NEGATIVE ng/mL
Creatinine, Urine: 233.78 mg/dL (ref >20.0)
Fentanyl, Ur: NEGATIVE ng/mL
Meperidine, Ur: NEGATIVE ng/mL
Methadone Screen, Urine: NEGATIVE ng/mL
Tramadol Scrn, Ur: NEGATIVE ng/mL
Zolpidem, Urine: NEGATIVE ng/mL

## 2013-08-04 NOTE — Assessment & Plan Note (Signed)
Patient's blood pressure elevated at several recent appointments (150s systolic).   -start HCTZ 12.5 mg daily, gave patient BP log with instructions to complete and bring to follow-up appt in February

## 2013-08-04 NOTE — Assessment & Plan Note (Signed)
Patient complaining of daily anxiety with "panic attacks" where he feels more short of breath and anxious, passes after approximately 30 minutes.  Also likely depression component as he is disappointed with his physical state in last 2 years.  Has difficulty with sleep, appetite, anhedonia.  -sertraline 25 mg daily, readdress at follow-up visit with likely up-titration; need to verify that he was able to afford this SSRI with medication assistance program

## 2013-08-07 LAB — BENZODIAZEPINES (GC/LC/MS), URINE
Alprazolam (GC/LC/MS), ur confirm: NEGATIVE ng/mL
Alprazolam metabolite (GC/LC/MS), ur confirm: 100 ng/mL
Clonazepam metabolite (GC/LC/MS), ur confirm: NEGATIVE ng/mL
Flunitrazepam metabolite (GC/LC/MS), ur confirm: NEGATIVE ng/mL
Flurazepam metabolite (GC/LC/MS), ur confirm: NEGATIVE ng/mL
Lorazepam (GC/LC/MS), ur confirm: NEGATIVE ng/mL
Nordiazepam (GC/LC/MS), ur confirm: NEGATIVE ng/mL
Oxazepam (GC/LC/MS), ur confirm: NEGATIVE ng/mL

## 2013-08-07 LAB — OPIATES/OPIOIDS (LC/MS-MS)
Hydrocodone: 499 ng/mL
Morphine Urine: NEGATIVE ng/mL
Norhydrocodone, Ur: 296 ng/mL
Noroxycodone, Ur: NEGATIVE ng/mL
Oxycodone, ur: NEGATIVE ng/mL
Oxymorphone: NEGATIVE ng/mL

## 2013-08-07 NOTE — Progress Notes (Signed)
I saw and evaluated the patient.  I personally confirmed the key portions of Dr. Doreene Adas history and exam and reviewed pertinent patient test results.  The assessment, diagnosis, and plan were formulated together and I agree with the documentation in the resident's note.

## 2013-08-08 LAB — CANNABANOIDS (GC/LC/MS), URINE: THC-COOH (GC/LC/MS), ur confirm: >1000 ng/mL

## 2013-08-13 ENCOUNTER — Other Ambulatory Visit: Payer: Self-pay | Admitting: Internal Medicine

## 2013-08-20 ENCOUNTER — Ambulatory Visit: Payer: No Typology Code available for payment source | Admitting: Dietician

## 2013-08-20 ENCOUNTER — Ambulatory Visit (INDEPENDENT_AMBULATORY_CARE_PROVIDER_SITE_OTHER): Payer: No Typology Code available for payment source | Admitting: Pharmacist

## 2013-08-20 DIAGNOSIS — I2699 Other pulmonary embolism without acute cor pulmonale: Secondary | ICD-10-CM

## 2013-08-20 DIAGNOSIS — Z7901 Long term (current) use of anticoagulants: Secondary | ICD-10-CM

## 2013-08-20 LAB — POCT INR: INR: 1

## 2013-08-20 MED ORDER — WARFARIN SODIUM 10 MG PO TABS: 40.0000 mg | ORAL_TABLET | Freq: Every day | ORAL | Status: DC

## 2013-08-20 NOTE — Progress Notes (Signed)
Patient left without begin seen. Will try to contact patient to reschedule

## 2013-08-20 NOTE — Progress Notes (Signed)
Indication: Pulmonary embolism with possible chronic component Duration: Lifelong recommended by Pulmonary because of probably chronic component INR: Below target.  Dr. Gladstone Pih assessment and plan were reviewed and I agree with his documentation.

## 2013-08-20 NOTE — Progress Notes (Signed)
Anti-Coagulation Progress Note  Donald Brady is a 38 y.o. male who is currently on an anti-coagulation regimen.    RECENT RESULTS: Recent results are below, the most recent result is correlated with a dose of ZERO mg. per week:  He has been OFF warfarin for approximately 11 months. When last on warfarin, was requiring doses of 40-50mg  PER DAY. 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   Lab Results  Component Value Date   INR 1.00 08/20/2013   INR 1.20 10/02/2012   INR 1.20 09/11/2012    ANTI-COAG DOSE: Anticoagulation Dose Instructions as of 08/20/2013     [name] Tue Wed Thu Fri Sat   New Dose 40 mg 40 mg 40 mg 40 mg 40 mg 40 mg 40 mg    Description       As per doseresponse provided dosing instruction sheet.       ANTICOAG SUMMARY: Anticoagulation Episode Summary   Current INR goal 2.0-3.0  Next INR check 08/28/2013  INR from last check 1.00! (08/20/2013)  Weekly max dose   Target end date 06/02/2012  [facility]  Preferred lab   Send INR reminders to    Indications  Bilateral pulmonary embolism [415.19] Long term (current) use of anticoagulants [V58.61]        Comments         ANTICOAG TODAY: Anticoagulation Summary as of 08/20/2013   INR goal 2.0-3.0  Selected INR 1.00! (08/20/2013)  Next INR check 08/28/2013  Target end date 06/02/2012   Indications  Bilateral pulmonary embolism [415.19] Long term (current) use of anticoagulants [V58.61]      Anticoagulation Episode Summary   [facility]   Preferred lab    Send INR reminders to    Comments       PATIENT INSTRUCTIONS: Patient Instructions  Patient  instructed to take medications as defined in the Anti-coagulation Track section of this encounter.  Patient instructed to take today's dose.  Patient verbalized understanding of these instructions.       FOLLOW-UP Return in 8 days (on 08/28/2013) for Follow up INR at 1000h.  [name], III Pharm.D., CACP

## 2013-08-20 NOTE — Patient Instructions (Signed)
Patient instructed to take medications as defined in the Anti-coagulation Track section of this encounter.  Patient instructed to take today's dose.  Patient verbalized understanding of these instructions.    

## 2013-08-28 ENCOUNTER — Ambulatory Visit (INDEPENDENT_AMBULATORY_CARE_PROVIDER_SITE_OTHER): Payer: No Typology Code available for payment source | Admitting: Pharmacist

## 2013-08-28 DIAGNOSIS — I2699 Other pulmonary embolism without acute cor pulmonale: Secondary | ICD-10-CM

## 2013-08-28 DIAGNOSIS — Z7901 Long term (current) use of anticoagulants: Secondary | ICD-10-CM

## 2013-08-28 LAB — PROTIME-INR
INR: 5.7 (ref <1.50)
PROTHROMBIN TIME: 48.8 s — AB (ref 11.6–15.2)

## 2013-08-28 LAB — POCT INR: INR: 5.7

## 2013-08-28 NOTE — Progress Notes (Signed)
Reviewed notes from Dr. Elie Confer.  Unusual case of man with hx of bilat PEs in 2013 and unusually high warfarin dose of 280mg  per week!  Seen today with INR of 5.7 and warfarin reduced to 230mg  per week.  Believed to have high requirement due to diet and smoking.  These variables have changed.  Agree with plan.

## 2013-08-28 NOTE — Patient Instructions (Signed)
Patient instructed to take medications as defined in the Anti-coagulation Track section of this encounter.  Patient instructed to OMIT/HOLD today's dose.  Patient verbalized understanding of these instructions.    

## 2013-08-28 NOTE — Progress Notes (Signed)
Anti-Coagulation Progress Note  Donald Brady is a 38 y.o. male who is currently on an anti-coagulation regimen.    RECENT RESULTS: Recent results are below, the most recent result is correlated with a dose of 280 mg. per week: Lab Results  Component Value Date   INR 5.7 08/28/2013   INR 5.7* 08/28/2013   INR 1.00 08/20/2013    ANTI-COAG DOSE: Anticoagulation Dose Instructions as of 08/28/2013     [name] Tue Wed Thu Fri Sat   New Dose 30 mg 30 mg 40 mg 30 mg 30 mg 40 mg 30 mg    Description       As per doseresponse provided dosing instruction sheet.       ANTICOAG SUMMARY: Anticoagulation Episode Summary   Current INR goal 2.0-3.0  Next INR check 09/04/2013  INR from last check 5.7! (08/28/2013)  Weekly max dose   Target end date 06/02/2012  [facility]  Preferred lab   Send INR reminders to    Indications  Bilateral pulmonary embolism [415.19] Long term (current) use of anticoagulants [V58.61]        Comments         ANTICOAG TODAY: Anticoagulation Summary as of 08/28/2013   INR goal 2.0-3.0  Selected INR 5.7! (08/28/2013)  Next INR check 09/04/2013  Target end date 06/02/2012   Indications  Bilateral pulmonary embolism [415.19] Long term (current) use of anticoagulants [V58.61]      Anticoagulation Episode Summary   [facility]   Preferred lab    Send INR reminders to    Comments       PATIENT INSTRUCTIONS: Patient Instructions  Patient instructed to take medications as defined in the Anti-coagulation Track section of this encounter.  Patient instructed to OMIT/HOLD today's dose.  Patient verbalized understanding of these instructions.      FOLLOW-UP Return in 7 days (on 09/04/2013) for Follow up appointment at 1000h.  [name], III Pharm.D., CACP

## 2013-09-04 ENCOUNTER — Ambulatory Visit (INDEPENDENT_AMBULATORY_CARE_PROVIDER_SITE_OTHER): Payer: No Typology Code available for payment source | Admitting: Pharmacist

## 2013-09-04 ENCOUNTER — Ambulatory Visit: Payer: No Typology Code available for payment source

## 2013-09-04 DIAGNOSIS — Z7901 Long term (current) use of anticoagulants: Secondary | ICD-10-CM

## 2013-09-04 DIAGNOSIS — I2699 Other pulmonary embolism without acute cor pulmonale: Secondary | ICD-10-CM

## 2013-09-04 LAB — POCT INR: INR: 4.4

## 2013-09-04 NOTE — Patient Instructions (Signed)
Patient instructed to take medications as defined in the Anti-coagulation Track section of this encounter.  Patient instructed to OMIT/HOLD today's dose.  Patient verbalized understanding of these instructions.    

## 2013-09-04 NOTE — Progress Notes (Signed)
Anti-Coagulation Progress Note  Donald Brady is a 38 y.o. male who is currently on an anti-coagulation regimen.    RECENT RESULTS: Recent results are below, the most recent result is correlated with a dose of 230 mg. per week: Lab Results  Component Value Date   INR 4.40 09/04/2013   INR 5.7 08/28/2013   INR 5.7* 08/28/2013    ANTI-COAG DOSE: Anticoagulation Dose Instructions as of 09/04/2013     [name] Tue Wed Thu Fri Sat   New Dose 30 mg 25 mg 30 mg 25 mg 30 mg 25 mg 30 mg    Description       As per doseresponse provided dosing instruction sheet.       ANTICOAG SUMMARY: Anticoagulation Episode Summary   Current INR goal 2.0-3.0  Next INR check 09/10/2013  INR from last check 4.40! (09/04/2013)  Weekly max dose   Target end date 06/02/2012  [facility]  Preferred lab   Send INR reminders to    Indications  Bilateral pulmonary embolism [415.19] Long term (current) use of anticoagulants [V58.61]        Comments         ANTICOAG TODAY: Anticoagulation Summary as of 09/04/2013   INR goal 2.0-3.0  Selected INR 4.40! (09/04/2013)  Next INR check 09/10/2013  Target end date 06/02/2012   Indications  Bilateral pulmonary embolism [415.19] Long term (current) use of anticoagulants [V58.61]      Anticoagulation Episode Summary   [facility]   Preferred lab    Send INR reminders to    Comments       PATIENT INSTRUCTIONS: Patient Instructions  Patient instructed to take medications as defined in the Anti-coagulation Track section of this encounter.  Patient instructed to OMIT/HOLD today's dose.  Patient verbalized understanding of these instructions.       FOLLOW-UP Return in 6 days (on 09/10/2013) for Follow up INR at 1015h.  [name], III Pharm.D., CACP

## 2013-09-05 NOTE — Progress Notes (Signed)
I have reviewed Dr. Gladstone Pih documentation.  Donald Brady is on anticoagulation for PE.

## 2013-09-10 ENCOUNTER — Ambulatory Visit: Payer: No Typology Code available for payment source

## 2013-09-17 ENCOUNTER — Encounter: Payer: No Typology Code available for payment source | Admitting: Dietician

## 2013-09-17 ENCOUNTER — Ambulatory Visit (INDEPENDENT_AMBULATORY_CARE_PROVIDER_SITE_OTHER): Payer: No Typology Code available for payment source | Admitting: Internal Medicine

## 2013-09-17 ENCOUNTER — Ambulatory Visit (INDEPENDENT_AMBULATORY_CARE_PROVIDER_SITE_OTHER): Payer: No Typology Code available for payment source | Admitting: Pharmacist

## 2013-09-17 ENCOUNTER — Ambulatory Visit: Payer: No Typology Code available for payment source

## 2013-09-17 ENCOUNTER — Encounter: Payer: Self-pay | Admitting: Internal Medicine

## 2013-09-17 VITALS — BP 170/97 | HR 102 | Temp 98.0°F | Ht 75.0 in | Wt 389.4 lb

## 2013-09-17 DIAGNOSIS — E669 Obesity, unspecified: Secondary | ICD-10-CM

## 2013-09-17 DIAGNOSIS — G4730 Sleep apnea, unspecified: Secondary | ICD-10-CM

## 2013-09-17 DIAGNOSIS — J45909 Unspecified asthma, uncomplicated: Secondary | ICD-10-CM

## 2013-09-17 DIAGNOSIS — I2699 Other pulmonary embolism without acute cor pulmonale: Secondary | ICD-10-CM

## 2013-09-17 DIAGNOSIS — Z7901 Long term (current) use of anticoagulants: Secondary | ICD-10-CM

## 2013-09-17 DIAGNOSIS — I10 Essential (primary) hypertension: Secondary | ICD-10-CM

## 2013-09-17 DIAGNOSIS — R071 Chest pain on breathing: Secondary | ICD-10-CM

## 2013-09-17 DIAGNOSIS — F411 Generalized anxiety disorder: Secondary | ICD-10-CM

## 2013-09-17 DIAGNOSIS — F17200 Nicotine dependence, unspecified, uncomplicated: Secondary | ICD-10-CM

## 2013-09-17 DIAGNOSIS — R0781 Pleurodynia: Secondary | ICD-10-CM

## 2013-09-17 DIAGNOSIS — M1712 Unilateral primary osteoarthritis, left knee: Secondary | ICD-10-CM

## 2013-09-17 DIAGNOSIS — Z720 Tobacco use: Secondary | ICD-10-CM

## 2013-09-17 DIAGNOSIS — M1710 Unilateral primary osteoarthritis, unspecified knee: Secondary | ICD-10-CM

## 2013-09-17 DIAGNOSIS — IMO0002 Reserved for concepts with insufficient information to code with codable children: Secondary | ICD-10-CM

## 2013-09-17 LAB — POCT INR: INR: 3.2

## 2013-09-17 MED ORDER — HYDROCHLOROTHIAZIDE 12.5 MG PO CAPS: 12.5000 mg | ORAL_CAPSULE | Freq: Every day | ORAL | Status: DC

## 2013-09-17 MED ORDER — CITALOPRAM HYDROBROMIDE 20 MG PO TABS: 20.0000 mg | ORAL_TABLET | Freq: Every day | ORAL | Status: DC

## 2013-09-17 NOTE — Assessment & Plan Note (Signed)
Stable.  Patient to meet with social worker tomorrow to see about getting Albuterol and Flunisolide inhalers at affordable prices.  Patient quit smoking since last visit, congratulated on this accomplishment.  He is meeting with dietician to discuss weight loss today.  Will try Claritin to see if this helps with his ?allergies/chronic congestion.  Continue to monitor.
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
## 2013-09-17 NOTE — Assessment & Plan Note (Signed)
Patient quit smoking since last visit.  States he has more money for his medications now that he doesn't spend it on cigarettes.

## 2013-09-17 NOTE — Assessment & Plan Note (Signed)
BP elevated again today.  Patient did not pick up rx for HCTZ after last visit, states he will do so and start taking today.

## 2013-09-17 NOTE — Assessment & Plan Note (Signed)
Continue Vicodin (patient signed pain contract at last visit).  We agreed that gabapentin likely not necessary if pain is well controlled on Vicodin.

## 2013-09-17 NOTE — Patient Instructions (Signed)
Follow-up with Dr. Stann Mainland in 1 month.   Congratulations on quitting smoking! That is great news.    Keep up the good work taking your medicines as prescribed.  Keep following up with Dr. Elie Confer about your Coumadin dosing.  You definitely need to be on a blood pressure medicine!  Please pick up this medicine (called hydrochlorothiazide) as well as your antidepressant called CITALOPRAM at [facility], each one will be $4.     You can also buy CLARITAN over the counter for allergies, hopefully this will help with your chronic congestion.    Don't forget to pick up your pain medicine prescription on your way out.   We are referring you for a sleep study to see if you need a CPAP mask.  They will call you with the appointment time.   Calorie Counting Diet A calorie counting diet requires you to eat the number of calories that are right for you in a day. Calories are the measurement of how much energy you get from the food you eat. Eating the right amount of calories is important for staying at a healthy weight. If you eat too many calories, your body will store them as fat and you may gain weight. If you eat too few calories, you may lose weight. Counting the number of calories you eat during a day will help you know if you are eating the right amount. A Registered Dietitian can determine how many calories you need in a day. The amount of calories needed varies from person to person. If your goal is to lose weight, you will need to eat fewer calories. Losing weight can benefit you if you are overweight or have health problems such as heart disease, high blood pressure, or diabetes. If your goal is to gain weight, you will need to eat more calories. Gaining weight may be necessary if you have a certain health problem that causes your body to need more energy. TIPS Whether you are increasing or decreasing the number of calories you eat during a day, it may be hard to get used to changes in what you eat and drink.  The following are tips to help you keep track of the number of calories you eat.  Measure foods at home with measuring cups. This helps you know the amount of food and number of calories you are eating.  Restaurants often serve food in amounts that are larger than 1 serving. While eating out, estimate how many servings of a food you are given. For example, a serving of cooked rice is  cup or about the size of half of a fist. Knowing serving sizes will help you be aware of how much food you are eating at restaurants.  Ask for smaller portion sizes or child-size portions at restaurants.  Plan to eat half of a meal at a restaurant. Take the rest home or share the other half with a friend.  Read the Nutrition Facts panel on food labels for calorie content and serving size. You can find out how many servings are in a package, the size of a serving, and the number of calories each serving has.  For example, a package might contain 3 cookies. The Nutrition Facts panel on that package says that 1 serving is 1 cookie. Below that, it will say there are 3 servings in the container. The calories section of the Nutrition Facts label says there are 90 calories. This means there are 90 calories in 1 cookie (1 serving).  If you eat 1 cookie you have eaten 90 calories. If you eat all 3 cookies, you have eaten 270 calories (3 servings x 90 calories = 270 calories). The list below tells you how big or small some common portion sizes are.  1 oz.........4 stacked dice.  3 oz........Marland KitchenDeck of cards.  1 tsp.......Marland KitchenTip of little finger.  1 tbs......Marland KitchenMarland KitchenThumb.  2 tbs.......Marland KitchenGolf ball.   cup......Marland KitchenHalf of a fist.  1 cup.......Marland KitchenA fist. KEEP A FOOD LOG Write down every food item you eat, the amount you eat, and the number of calories in each food you eat during the day. At the end of the day, you can add up the total number of calories you have eaten. It may help to keep a list like the one below. Find out the  calorie information by reading the Nutrition Facts panel on food labels. Breakfast  Bran cereal (1 cup, 110 calories).  Fat-free milk ( cup, 45 calories). Snack  Apple (1 medium, 80 calories). Lunch  Spinach (1 cup, 20 calories).  Tomato ( medium, 20 calories).  Chicken breast strips (3 oz, 165 calories).  Shredded cheddar cheese ( cup, 110 calories).  Light [place] dressing (2 tbs, 60 calories).  Whole-wheat bread (1 slice, 80 calories).  Tub margarine (1 tsp, 35 calories).  Vegetable soup (1 cup, 160 calories). Dinner  Pork chop (3 oz, 190 calories).  Brown rice (1 cup, 215 calories).  Steamed broccoli ( cup, 20 calories).  Strawberries (1  cup, 65 calories).  Whipped cream (1 tbs, 50 calories). Daily Calorie Total: 6811 Document Released: 08/02/2005 Document Revised: 10/25/2011 Document Reviewed: 01/27/2007 [facility] Patient Information 2014 [place].

## 2013-09-17 NOTE — Assessment & Plan Note (Signed)
Unable to fill sertraline due to financial constraints.  Will prescribe citalopram today as it is on $4 list, less likely to cause weight gain and insomnia that other SSRIs.

## 2013-09-17 NOTE — Patient Instructions (Signed)
Patient instructed to take medications as defined in the Anti-coagulation Track section of this encounter.  Patient instructed to OMIT/HOLD today's dose.  Patient verbalized understanding of these instructions.    

## 2013-09-17 NOTE — Assessment & Plan Note (Signed)
Patient with symptoms of likely OSA with possible superimposed OHS.  He is willing to complete sleep study thus referred him for this today.

## 2013-09-17 NOTE — Assessment & Plan Note (Signed)
Patient now on Coumadin and following regularly with Dr. Elie Confer.  INR 3.2 today.

## 2013-09-17 NOTE — Progress Notes (Signed)
Anti-Coagulation Progress Note  Donald Brady is a 38 y.o. male who is currently on an anti-coagulation regimen.    RECENT RESULTS: Recent results are below, the most recent result is correlated with a dose of 195 mg. per week: Lab Results  Component Value Date   INR 3.20 09/17/2013   INR 4.40 09/04/2013   INR 5.7 08/28/2013    ANTI-COAG DOSE: Anticoagulation Dose Instructions as of 09/17/2013     Donald Brady Tue Wed Thu Fri Sat   New Dose 25 mg 20 mg 25 mg 25 mg 20 mg 25 mg 25 mg    Description       As per doseresponse provided dosing instruction sheet.       ANTICOAG SUMMARY: Anticoagulation Episode Summary   Current INR goal 2.0-3.0  Next INR check 10/01/2013  INR from last check 3.20! (09/17/2013)  Weekly max dose   Target end date 06/02/2012  [facility] location [facility]  Preferred lab   Send INR reminders to    Indications  Bilateral pulmonary embolism [415.19] Long term (current) use of anticoagulants [V58.61]        Comments         ANTICOAG TODAY: Anticoagulation Summary as of 09/17/2013   INR goal 2.0-3.0  Selected INR 3.20! (09/17/2013)  Next INR check 10/01/2013  Target end date 06/02/2012   Indications  Bilateral pulmonary embolism [415.19] Long term (current) use of anticoagulants [V58.61]      Anticoagulation Episode Summary   INR check location [facility]   Preferred lab    Send INR reminders to    Comments       PATIENT INSTRUCTIONS: Patient Instructions  Patient instructed to take medications as defined in the Anti-coagulation Track section of this encounter.  Patient instructed to OMIT/HOLD today's dose.  Patient verbalized understanding of these instructions.       FOLLOW-UP Return in 2 weeks (on 10/01/2013) for Follow up INR at 1000h.  Donald Brady, III Pharm.D., CACP

## 2013-09-17 NOTE — Assessment & Plan Note (Signed)
Knee pain better controlled now that he is on Vicodin for lung pain.  However, he does still have pain in left knee that is worse with activity.  Encouraged weight loss again today.  Patient has follow-up appointment with sports medicine at end of this month.

## 2013-09-18 NOTE — Progress Notes (Signed)
Case discussed with Dr. Rogers at time of visit.  We reviewed the resident's history and exam and pertinent patient test results.  I agree with the assessment, diagnosis, and plan of care documented in the resident's note. 

## 2013-09-27 ENCOUNTER — Encounter: Payer: Self-pay | Admitting: Internal Medicine

## 2013-10-01 ENCOUNTER — Ambulatory Visit: Payer: No Typology Code available for payment source

## 2013-10-01 ENCOUNTER — Encounter: Payer: No Typology Code available for payment source | Admitting: Dietician

## 2013-10-15 ENCOUNTER — Other Ambulatory Visit: Payer: Self-pay | Admitting: *Deleted

## 2013-10-15 DIAGNOSIS — I10 Essential (primary) hypertension: Secondary | ICD-10-CM

## 2013-10-15 DIAGNOSIS — R0781 Pleurodynia: Secondary | ICD-10-CM

## 2013-10-15 NOTE — Telephone Encounter (Signed)
Pt needs 90 day supply of hctz and celexa

## 2013-10-16 MED ORDER — HYDROCHLOROTHIAZIDE 12.5 MG PO CAPS: 12.5000 mg | ORAL_CAPSULE | Freq: Every day | ORAL | Status: DC

## 2013-10-16 MED ORDER — CITALOPRAM HYDROBROMIDE 20 MG PO TABS: 20.0000 mg | ORAL_TABLET | Freq: Every day | ORAL | Status: DC

## 2013-10-16 MED ORDER — HYDROCODONE-ACETAMINOPHEN 5-325 MG PO TABS: 1.0000 | ORAL_TABLET | Freq: Four times a day (QID) | ORAL | Status: DC | PRN

## 2013-10-18 ENCOUNTER — Encounter: Payer: Self-pay | Admitting: Internal Medicine

## 2013-10-18 ENCOUNTER — Ambulatory Visit (INDEPENDENT_AMBULATORY_CARE_PROVIDER_SITE_OTHER): Payer: No Typology Code available for payment source | Admitting: Internal Medicine

## 2013-10-18 ENCOUNTER — Ambulatory Visit (HOSPITAL_BASED_OUTPATIENT_CLINIC_OR_DEPARTMENT_OTHER): Payer: No Typology Code available for payment source | Attending: Internal Medicine

## 2013-10-18 VITALS — Ht 75.0 in | Wt 380.0 lb

## 2013-10-18 VITALS — BP 147/98 | HR 64 | Temp 99.0°F | Ht 75.0 in | Wt 380.8 lb

## 2013-10-18 DIAGNOSIS — E669 Obesity, unspecified: Secondary | ICD-10-CM

## 2013-10-18 DIAGNOSIS — I10 Essential (primary) hypertension: Secondary | ICD-10-CM

## 2013-10-18 DIAGNOSIS — N182 Chronic kidney disease, stage 2 (mild): Secondary | ICD-10-CM

## 2013-10-18 DIAGNOSIS — F411 Generalized anxiety disorder: Secondary | ICD-10-CM

## 2013-10-18 DIAGNOSIS — G4733 Obstructive sleep apnea (adult) (pediatric): Secondary | ICD-10-CM

## 2013-10-18 DIAGNOSIS — Z9989 Dependence on other enabling machines and devices: Secondary | ICD-10-CM

## 2013-10-18 DIAGNOSIS — I129 Hypertensive chronic kidney disease with stage 1 through stage 4 chronic kidney disease, or unspecified chronic kidney disease: Secondary | ICD-10-CM

## 2013-10-18 DIAGNOSIS — G4730 Sleep apnea, unspecified: Secondary | ICD-10-CM

## 2013-10-18 MED ORDER — HYDROCHLOROTHIAZIDE 25 MG PO TABS: 25.0000 mg | ORAL_TABLET | Freq: Every day | ORAL | Status: DC
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
## 2013-10-18 NOTE — Patient Instructions (Signed)
Please start new dose of Hydrochlorothiazide (25 mg) you can take 2 of your 12.5 mg until your current bottle runs out. GREAT WORK with weight loss and exercise keep it up.

## 2013-10-18 NOTE — Assessment & Plan Note (Signed)
Likely secondary to uncontrolled HTN.  Microalbumin/Cr ratio 1.86 in 2013.  Patient not on ACEi.  Increased HCTZ today.  Patient to follow up in 1 month, if still hypertensive add ACEi.

## 2013-10-18 NOTE — Assessment & Plan Note (Signed)
To have sleep study tonight.

## 2013-10-18 NOTE — Assessment & Plan Note (Signed)
Has lost 9 lbs in last month.  Exercising 30-45 mins 4 times a week, goal is to increase this to 6 times a week.  Reports decreased anxiety and appetite since starting Celexa. - Does not want referral to nutrition or life counseling wants to continue weight loss on his own.

## 2013-10-18 NOTE — Assessment & Plan Note (Signed)
BP Readings from Last 3 Encounters:  10/18/13 147/98  09/17/13 170/97  08/03/13 158/107    Lab Results  Component Value Date   NA 140 03/26/2013   K 4.2 03/26/2013   CREATININE 1.52* 03/26/2013    Assessment: Blood pressure control: mildly elevated Progress toward BP goal:  improved Comments:   Plan: Medications:  HCTZ 25mg  QD Educational resources provided:   Self management tools provided:   Other plans: Patient increased from 12.5 to 25mg  of HCTZ.  Will have patient back in one month.  Patient also has CKD Stage 2 if additional agent is indicated at that time recommend starting ACEi.

## 2013-10-18 NOTE — Assessment & Plan Note (Signed)
Much improved Continue Celexa 20mg 

## 2013-10-20 DIAGNOSIS — G4733 Obstructive sleep apnea (adult) (pediatric): Secondary | ICD-10-CM

## 2013-10-20 NOTE — Sleep Study (Signed)
   NAME: Donald Brady DATE OF BIRTH:  [birth year] MEDICAL RECORD NUMBER [id number]  LOCATION: [facility]  PHYSICIAN: YOUNG,CLINTON D  DATE OF STUDY: 10/18/2013  SLEEP STUDY TYPE: Nocturnal Polysomnogram               REFERRING PHYSICIAN: Ivin Poot, MD  INDICATION FOR STUDY: Hypersomnia with sleep apnea  EPWORTH SLEEPINESS SCORE:   17/24 HEIGHT: 6\' 3"  (190.5 cm)  WEIGHT: 380 lb (172.367 kg)    Body mass index is 47.5 kg/(m^2).  NECK SIZE: 21 in.  MEDICATIONS: Charted for review  SLEEP ARCHITECTURE: Split study protocol. During the diagnostic phase, total sleep time 119.5 minutes with sleep efficiency 69.7%. Stage I was 23.8%, stage II 76.2%, stage III and REM were absent. Sleep latency 34 minutes, awake after sleep onset 18 minutes, arousal index 14.1. Bedtime medication: None.  RESPIRATORY DATA: Apnea hypopnea index (AHI) 41.2 per hour. 82 total events scored including 8 obstructive apneas and 74 hypopneas. Events were seen in all positions. REM AHI 0. CPAP was titrated to 14 CWP, AHI 1.9 per hour. He wore a large [employer] Simplus fullface mask with heated humidifier and an EPR of 1.  OXYGEN DATA: Very loud snoring with oxygen desaturation before CPAP to a nadir of 85% on room air. With CPAP control, snoring was prevented and mean oxygen saturation held 90.7% on room air.  CARDIAC DATA: Sinus rhythm with PACs  MOVEMENT/PARASOMNIA: A few limb jerks were noted with little effect on sleep. No bathroom trips.  IMPRESSION/ RECOMMENDATION:   1) Severe obstructive sleep apnea/hypopneas syndrome, AHI 41.2 per hour with non-positional events. Very loud snoring with oxygen desaturation to a nadir of 85% on room air. 2) Successful CPAP titration to 14 CWP, AHI 1.9 per hour. He wore a large [employer] Simplus mask with heated humidifier and an EPR of 1. Snoring was prevented and mean oxygen saturation held 90.7% on room air.   Signed Baird Lyons  M.D. Deneise Lever Diplomate, American Board of Sleep Medicine  ELECTRONICALLY SIGNED ON:  10/20/2013, 12:39 PM [place] PH: [phone]   FX: [phone] [place]

## 2013-10-22 ENCOUNTER — Ambulatory Visit: Payer: No Typology Code available for payment source

## 2013-10-22 NOTE — Progress Notes (Signed)
Case discussed with Dr. Hoffman at the time of the visit.  We reviewed the resident's history and exam and pertinent patient test results.  I agree with the assessment, diagnosis, and plan of care documented in the resident's note. 

## 2013-11-11 ENCOUNTER — Encounter (HOSPITAL_COMMUNITY): Payer: Self-pay | Admitting: Emergency Medicine

## 2013-11-11 ENCOUNTER — Emergency Department (HOSPITAL_COMMUNITY)
Admission: EM | Admit: 2013-11-11 | Discharge: 2013-11-11 | Disposition: A | Discharge status: Home / Self Care | Payer: No Typology Code available for payment source | Attending: Emergency Medicine | Admitting: Emergency Medicine

## 2013-11-11 ENCOUNTER — Emergency Department (HOSPITAL_COMMUNITY): Payer: No Typology Code available for payment source

## 2013-11-11 DIAGNOSIS — Z79899 Other long term (current) drug therapy: Secondary | ICD-10-CM | POA: Insufficient documentation

## 2013-11-11 DIAGNOSIS — J45901 Unspecified asthma with (acute) exacerbation: Secondary | ICD-10-CM | POA: Insufficient documentation

## 2013-11-11 DIAGNOSIS — IMO0002 Reserved for concepts with insufficient information to code with codable children: Secondary | ICD-10-CM | POA: Insufficient documentation

## 2013-11-11 DIAGNOSIS — Z7901 Long term (current) use of anticoagulants: Secondary | ICD-10-CM | POA: Insufficient documentation

## 2013-11-11 DIAGNOSIS — Z8669 Personal history of other diseases of the nervous system and sense organs: Secondary | ICD-10-CM | POA: Insufficient documentation

## 2013-11-11 DIAGNOSIS — R071 Chest pain on breathing: Secondary | ICD-10-CM | POA: Insufficient documentation

## 2013-11-11 DIAGNOSIS — I10 Essential (primary) hypertension: Secondary | ICD-10-CM | POA: Insufficient documentation

## 2013-11-11 DIAGNOSIS — R042 Hemoptysis: Secondary | ICD-10-CM | POA: Insufficient documentation

## 2013-11-11 DIAGNOSIS — Z86711 Personal history of pulmonary embolism: Secondary | ICD-10-CM | POA: Insufficient documentation

## 2013-11-11 DIAGNOSIS — R0781 Pleurodynia: Secondary | ICD-10-CM

## 2013-11-11 DIAGNOSIS — F17200 Nicotine dependence, unspecified, uncomplicated: Secondary | ICD-10-CM | POA: Insufficient documentation

## 2013-11-11 HISTORY — DX: Unspecified asthma, uncomplicated: J45.909

## 2013-11-11 HISTORY — DX: Essential (primary) hypertension: I10

## 2013-11-11 LAB — COMPREHENSIVE METABOLIC PANEL
ALT: 59 U/L — ABNORMAL HIGH (ref 0–53)
AST: 60 U/L — ABNORMAL HIGH (ref 0–37)
Albumin: 3.7 g/dL (ref 3.5–5.2)
Alkaline Phosphatase: 60 U/L (ref 39–117)
BUN: 12 mg/dL (ref 6–23)
CALCIUM: 8.9 mg/dL (ref 8.4–10.5)
CO2: 21 meq/L (ref 19–32)
Chloride: 101 mEq/L (ref 96–112)
Creatinine, Ser: 1.45 mg/dL — ABNORMAL HIGH (ref 0.50–1.35)
GFR calc non Af Amer: 61 mL/min — ABNORMAL LOW (ref >90)
GFR, EST AFRICAN AMERICAN: 70 mL/min — AB (ref >90)
GLUCOSE: 102 mg/dL — AB (ref 70–99)
Potassium: 5.2 mEq/L (ref 3.7–5.3)
Sodium: 139 mEq/L (ref 137–147)
TOTAL PROTEIN: 7.9 g/dL (ref 6.0–8.3)
Total Bilirubin: 0.3 mg/dL (ref 0.3–1.2)

## 2013-11-11 LAB — CBC WITH DIFFERENTIAL/PLATELET
Basophils Absolute: 0.1 10*3/uL (ref 0.0–0.1)
Basophils Relative: 0 % (ref 0–1)
EOS ABS: 0 10*3/uL (ref 0.0–0.7)
EOS PCT: 0 % (ref 0–5)
HEMATOCRIT: 48.1 % (ref 39.0–52.0)
Hemoglobin: 17.4 g/dL — ABNORMAL HIGH (ref 13.0–17.0)
LYMPHS ABS: 2.5 10*3/uL (ref 0.7–4.0)
Lymphocytes Relative: 21 % (ref 12–46)
MCH: 32 pg (ref 26.0–34.0)
MCHC: 36.2 g/dL — ABNORMAL HIGH (ref 30.0–36.0)
MCV: 88.4 fL (ref 78.0–100.0)
MONO ABS: 1.1 10*3/uL — AB (ref 0.1–1.0)
Monocytes Relative: 9 % (ref 3–12)
Neutro Abs: 8.3 10*3/uL — ABNORMAL HIGH (ref 1.7–7.7)
Neutrophils Relative %: 69 % (ref 43–77)
Platelets: 286 10*3/uL (ref 150–400)
RBC: 5.44 MIL/uL (ref 4.22–5.81)
RDW: 13.9 % (ref 11.5–15.5)
WBC: 12.1 10*3/uL — AB (ref 4.0–10.5)

## 2013-11-11 LAB — PROTIME-INR
INR: 0.95 (ref 0.00–1.49)
PROTHROMBIN TIME: 12.5 s (ref 11.6–15.2)

## 2013-11-11 LAB — TROPONIN I: Troponin I: <0.3 ng/mL (ref <0.30)

## 2013-11-11 MED ORDER — METOCLOPRAMIDE HCL 5 MG/ML IJ SOLN
10.0000 mg | Freq: Once | INTRAMUSCULAR | Status: AC
  Administered 2013-11-11: 10 mg via INTRAVENOUS
  Filled 2013-11-11: qty 2

## 2013-11-11 MED ORDER — ONDANSETRON HCL 4 MG/2ML IJ SOLN
4.0000 mg | Freq: Once | INTRAMUSCULAR | Status: AC
  Administered 2013-11-11: 4 mg via INTRAVENOUS

## 2013-11-11 MED ORDER — IOHEXOL 350 MG/ML SOLN
75.0000 mL | Freq: Once | INTRAVENOUS | Status: AC | PRN
  Administered 2013-11-11: 75 mL via INTRAVENOUS

## 2013-11-11 MED ORDER — DIPHENHYDRAMINE HCL 50 MG/ML IJ SOLN
25.0000 mg | Freq: Once | INTRAMUSCULAR | Status: AC
  Administered 2013-11-11: 25 mg via INTRAVENOUS
  Filled 2013-11-11: qty 1
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
## 2013-11-11 NOTE — ED Notes (Signed)
The pt arrived by gems from home. He woke up one hour ago with mid chest pain through to his back.  Nauseated and vomited approx 250cc coffee ground emesis.  Hx of pe and he is currently on coumadin. Alert oriented skin warm and dry.  Coughing at present

## 2013-11-11 NOTE — ED Notes (Signed)
The pt has  Had his most recent pe a couple of months back

## 2013-11-11 NOTE — ED Notes (Signed)
Patient returned from CT

## 2013-11-11 NOTE — ED Notes (Signed)
The pt returned from xray 

## 2013-11-11 NOTE — Discharge Instructions (Signed)
Follow up as soon as possible with your physician for continued management of your condition.  If you develop any new, or concerning changes in your condition, please return to the emergency department immediately.   Chest Pain (Nonspecific) It is often hard to give a specific diagnosis for the cause of chest pain. There is always a chance that your pain could be related to something serious, such as a heart attack or a blood clot in the lungs. You need to follow up with your caregiver for further evaluation. CAUSES   Heartburn.  Pneumonia or bronchitis.  Anxiety or stress.  Inflammation around your heart (pericarditis) or lung (pleuritis or pleurisy).  A blood clot in the lung.  A collapsed lung (pneumothorax). It can develop suddenly on its own (spontaneous pneumothorax) or from injury (trauma) to the chest.  Shingles infection (herpes zoster virus). The chest wall is composed of bones, muscles, and cartilage. Any of these can be the source of the pain.  The bones can be bruised by injury.  The muscles or cartilage can be strained by coughing or overwork.  The cartilage can be affected by inflammation and become sore (costochondritis). DIAGNOSIS  Lab tests or other studies, such as X-rays, electrocardiography, stress testing, or cardiac imaging, may be needed to find the cause of your pain.  TREATMENT   Treatment depends on what may be causing your chest pain. Treatment may include:  Acid blockers for heartburn.  Anti-inflammatory medicine.  Pain medicine for inflammatory conditions.  Antibiotics if an infection is present.  You may be advised to change lifestyle habits. This includes stopping smoking and avoiding alcohol, caffeine, and chocolate.  You may be advised to keep your head raised (elevated) when sleeping. This reduces the chance of acid going backward from your stomach into your esophagus.  Most of the time, nonspecific chest pain will improve within 2 to  3 days with rest and mild pain medicine. HOME CARE INSTRUCTIONS   If antibiotics were prescribed, take your antibiotics as directed. Finish them even if you start to feel better.  For the next few days, avoid physical activities that bring on chest pain. Continue physical activities as directed.  Do not smoke.  Avoid drinking alcohol.  Only take over-the-counter or prescription medicine for pain, discomfort, or fever as directed by your caregiver.  Follow your caregiver's suggestions for further testing if your chest pain does not go away.  Keep any follow-up appointments you made. If you do not go to an appointment, you could develop lasting (chronic) problems with pain. If there is any problem keeping an appointment, you must call to reschedule. SEEK MEDICAL CARE IF:   You think you are having problems from the medicine you are taking. Read your medicine instructions carefully.  Your chest pain does not go away, even after treatment.  You develop a rash with blisters on your chest. SEEK IMMEDIATE MEDICAL CARE IF:   You have increased chest pain or pain that spreads to your arm, neck, jaw, back, or abdomen.  You develop shortness of breath, an increasing cough, or you are coughing up blood.  You have severe back or abdominal pain, feel nauseous, or vomit.  You develop severe weakness, fainting, or chills.  You have a fever. THIS IS AN EMERGENCY. Do not wait to see if the pain will go away. Get medical help at once. Call your local emergency services (911 in [place] Do not drive yourself to the hospital. MAKE SURE YOU:   Understand  these instructions.  Will watch your condition.  Will get help right away if you are not doing well or get worse. Document Released: 05/12/2005 Document Revised: 10/25/2011 Document Reviewed: 03/07/2008 [facility] Patient Information 2014 [employer].

## 2013-11-11 NOTE — ED Notes (Signed)
Patient transported to CT 
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
## 2013-11-11 NOTE — ED Notes (Signed)
Pt to xray

## 2013-11-11 NOTE — ED Notes (Signed)
The pt reports that he usually coughs blood tinged  Sputum.  He has been out of all his meds since thursday

## 2013-11-11 NOTE — ED Provider Notes (Signed)
Patient's CTA negative.  No new complaints.  VS stable.  Patient discharged.   Carmin Muskrat, MD 11/11/13 [phone]

## 2013-11-12 ENCOUNTER — Other Ambulatory Visit: Payer: Self-pay | Admitting: *Deleted

## 2013-11-12 NOTE — Telephone Encounter (Signed)
Pt called and stated he needs refills on coumadin and pain med. He was informed that he has not seen dr groce since 2/2 and would need a PT/ INR for correct dosing of coumadin, he states he will come tomorrow at 1400 for this lab, please put the order in and will have attending of the pm review results and send in proper dosage for coumadin also he would like to pick up hydrocodone script at that time.

## 2013-11-13 ENCOUNTER — Other Ambulatory Visit (INDEPENDENT_AMBULATORY_CARE_PROVIDER_SITE_OTHER): Payer: No Typology Code available for payment source

## 2013-11-13 ENCOUNTER — Encounter: Payer: Self-pay | Admitting: Internal Medicine

## 2013-11-13 ENCOUNTER — Other Ambulatory Visit: Payer: Self-pay | Admitting: Internal Medicine

## 2013-11-13 DIAGNOSIS — I8290 Acute embolism and thrombosis of unspecified vein: Secondary | ICD-10-CM

## 2013-11-13 DIAGNOSIS — Z7901 Long term (current) use of anticoagulants: Secondary | ICD-10-CM

## 2013-11-13 LAB — POCT INR: INR: 1.3

## 2013-11-13 MED ORDER — WARFARIN SODIUM 10 MG PO TABS: ORAL_TABLET | ORAL | Status: DC

## 2013-11-13 NOTE — Telephone Encounter (Signed)
It was ready and pt has been informed, he is coming in this pm for PT/INR

## 2013-11-13 NOTE — Progress Notes (Signed)
Donald Brady presents today having run out of coumadin last Thursday.  His INR is sub-therapeutic as expected.  He was provided with a prescription for coumadin at his last prescribed dose in early February.  He was asked to schedule an appointment with Dr. Elie Confer upon leaving the clinic.  He was informed that further refills of the coumadin require that he be monitored in the Anticoagulation Clinic to assure the medication is being safely administered (benefits > risks).

## 2013-11-13 NOTE — Telephone Encounter (Signed)
Not sure it is appropriate for patient to already be picking up pain med prescription unless he has not picked up most recent one- the last one I wrote was to be filled at the end of this month (3/25 I believe).  So if he has not yet picked that up, then it would be appropriate to give it to him (already signed on file in [facility]), but if he already has that one, I prefer not give him another at this time. (I am giving him rather large quantities of opiates so he should not be picking up these prescriptions >3 weeks early.)  Patient is also supposed to have a follow-up appointment in clinic later this month regarding HTN and sleep apnea so we can provide additional pain med prescription at this time. Thanks

## 2013-11-13 NOTE — Telephone Encounter (Signed)
PM clinic attending to handle Coumadin dosing. See my other note regarding Vicodin. Thanks

## 2013-11-20 ENCOUNTER — Encounter: Payer: Self-pay | Admitting: Internal Medicine

## 2013-11-20 ENCOUNTER — Ambulatory Visit (INDEPENDENT_AMBULATORY_CARE_PROVIDER_SITE_OTHER): Payer: No Typology Code available for payment source | Admitting: Internal Medicine

## 2013-11-20 VITALS — BP 163/104 | HR 84 | Temp 97.5°F | Ht 75.0 in | Wt 375.8 lb

## 2013-11-20 DIAGNOSIS — G4730 Sleep apnea, unspecified: Secondary | ICD-10-CM

## 2013-11-20 DIAGNOSIS — G4733 Obstructive sleep apnea (adult) (pediatric): Secondary | ICD-10-CM | POA: Insufficient documentation

## 2013-11-20 DIAGNOSIS — I2699 Other pulmonary embolism without acute cor pulmonale: Secondary | ICD-10-CM

## 2013-11-20 DIAGNOSIS — I10 Essential (primary) hypertension: Secondary | ICD-10-CM

## 2013-11-20 DIAGNOSIS — Z7901 Long term (current) use of anticoagulants: Secondary | ICD-10-CM

## 2013-11-20 LAB — POCT INR: INR: 1

## 2013-11-20 LAB — CBC WITH DIFFERENTIAL/PLATELET
Basophils Absolute: 0.1 10*3/uL (ref 0.0–0.1)
Basophils Relative: 1 % (ref 0–1)
EOS ABS: 0.2 10*3/uL (ref 0.0–0.7)
EOS PCT: 3 % (ref 0–5)
HEMATOCRIT: 50.6 % (ref 39.0–52.0)
Hemoglobin: 17.6 g/dL — ABNORMAL HIGH (ref 13.0–17.0)
Lymphocytes Relative: 28 % (ref 12–46)
Lymphs Abs: 2.1 10*3/uL (ref 0.7–4.0)
MCH: 31 pg (ref 26.0–34.0)
MCHC: 34.8 g/dL (ref 30.0–36.0)
MCV: 89.2 fL (ref 78.0–100.0)
MONO ABS: 0.7 10*3/uL (ref 0.1–1.0)
MONOS PCT: 9 % (ref 3–12)
NEUTROS ABS: 4.4 10*3/uL (ref 1.7–7.7)
Neutrophils Relative %: 59 % (ref 43–77)
Platelets: 267 10*3/uL (ref 150–400)
RBC: 5.67 MIL/uL (ref 4.22–5.81)
RDW: 13.9 % (ref 11.5–15.5)
WBC: 7.5 10*3/uL (ref 4.0–10.5)

## 2013-11-20 LAB — PROTIME-INR
INR: 1.06 (ref <1.50)
Prothrombin Time: 13.6 seconds (ref 11.6–15.2)

## 2013-11-20 MED ORDER — LISINOPRIL 5 MG PO TABS: 5.0000 mg | ORAL_TABLET | Freq: Every day | ORAL | Status: DC

## 2013-11-20 NOTE — Assessment & Plan Note (Addendum)
Severe OSA by sleep study in [place]. CPAP ordered. Patient does not have insurance or way to pay for CPAP. I received a message during the clinic visit that the patients PCP had placed a form in my mailbox regarding approval for CPAP. I interrupted clinic to look in both of my mailboxes (clinic anc IMTS office). I was unable to locate the form. I spoke with Reuben Likes, [facility] SW, regarding access to CPAP. She was unaware of any way for the patient to get CPAP without having the pay out of pocket. Patient would likely benefit from CPAP, but he is unable to afford it at this time.  CPAP settings per sleep study:  CPAP titration to 14 CWP, AHI 1.9 per hour. He wore a large [employer] Simplus mask with heated humidifier and an EPR of 1. Snoring was prevented and mean oxygen saturation held 90.7% on room air.

## 2013-11-20 NOTE — Assessment & Plan Note (Signed)
BP remains moderately elevated. Treatment of severe OSA would likely help decrease BP. See below regarding this.   Plan to start low dose lisinopril. F/U with PCP in 4-6 weeks for BP recheck and titration of BP meds as indicated.

## 2013-11-20 NOTE — Assessment & Plan Note (Addendum)
Patient restarted coumadin 6 days previous at 25 mg M,W,F,Sa,Su and 20 mg on Tu and Th. He reports compliance with this. However, his INR today is 1.0. I informed the patient that this makes him at an elevated risk for clotting. I spoke with Dr. Elie Confer who has maanged Donald Brady's anticoagulation. Coumadin therapy has been poorly tolerated with many low and high INRs. Plan for patient to switch to Xarelto. However, the patient has limited financial resourses and is on orange card. I spoke with Marlana Latus, and patient should be able to get Xarelto via MAP. Plan for patient to apply for coumadin per MAP, gave instructions on this. I offered to anticoagulate the patient with lovenox daily injections until this if approved. The patient elected to continue coumadin until he is able to start Xarelto. I instructed patient to f/u with Dr. Elie Confer on Monday for INR check and coumadin titration.

## 2013-11-20 NOTE — Progress Notes (Signed)
Case discussed with Dr. Komanski at time of visit.  We reviewed the resident's history and exam and pertinent patient test results.  I agree with the assessment, diagnosis, and plan of care documented in the resident's note. 
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
## 2013-11-20 NOTE — Patient Instructions (Signed)
Your INR is too low. Coumadin continues to have difficulty keeping your blood appropriately thinned. I recommend that you take Xarelto. In order to afford this, you need to contact the MAP program. I have provided you witht he MAP program information. While your INR is low you are at increase risk for blood clots. We will continue your coumadin as previously directed. Please follow up with Dr. Elie Confer on Monday.  I have started lisinopril to help control your blood pressure.

## 2013-11-23 ENCOUNTER — Ambulatory Visit (INDEPENDENT_AMBULATORY_CARE_PROVIDER_SITE_OTHER): Payer: No Typology Code available for payment source | Admitting: Sports Medicine

## 2013-11-23 VITALS — BP 139/100 | Ht 75.0 in | Wt 365.0 lb

## 2013-11-23 DIAGNOSIS — M1710 Unilateral primary osteoarthritis, unspecified knee: Secondary | ICD-10-CM

## 2013-11-23 DIAGNOSIS — IMO0002 Reserved for concepts with insufficient information to code with codable children: Secondary | ICD-10-CM

## 2013-11-23 DIAGNOSIS — M1712 Unilateral primary osteoarthritis, left knee: Secondary | ICD-10-CM

## 2013-11-23 MED ORDER — METHYLPREDNISOLONE ACETATE 40 MG/ML IJ SUSP
40.0000 mg | Freq: Once | INTRAMUSCULAR | Status: AC
  Administered 2013-11-23: 40 mg via INTRA_ARTICULAR
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
## 2013-11-26 ENCOUNTER — Ambulatory Visit (INDEPENDENT_AMBULATORY_CARE_PROVIDER_SITE_OTHER): Payer: No Typology Code available for payment source | Admitting: Pharmacist

## 2013-11-26 DIAGNOSIS — Z7901 Long term (current) use of anticoagulants: Secondary | ICD-10-CM

## 2013-11-26 DIAGNOSIS — I2699 Other pulmonary embolism without acute cor pulmonale: Secondary | ICD-10-CM

## 2013-11-26 LAB — POCT INR: INR: 1.5

## 2013-11-26 NOTE — Progress Notes (Signed)
Anti-Coagulation Progress Note  Donald Brady is a 38 y.o. male who is currently on an anti-coagulation regimen.    RECENT RESULTS: Recent results are below, the most recent result is correlated with a dose of 165 mg. per week: Lab Results  Component Value Date   INR 1.50 11/26/2013   INR 1.06 11/20/2013   INR 1.0 11/20/2013    ANTI-COAG DOSE: Anticoagulation Dose Instructions as of 11/26/2013     [name] Tue Wed Thu Fri Sat   New Dose 25 mg 30 mg 25 mg 25 mg 30 mg 25 mg 25 mg    Description       As per doseresponse provided dosing instruction sheet.       ANTICOAG SUMMARY: Anticoagulation Episode Summary   Current INR goal 2.0-3.0  Next INR check 12/10/2013  INR from last check 1.50! (11/26/2013)  Weekly max dose   Target end date 06/02/2012  [facility] check [facility]  Preferred lab   Send INR reminders to    Indications  Bilateral pulmonary embolism [415.19] Long term (current) use of anticoagulants [V58.61]        Comments         ANTICOAG TODAY: Anticoagulation Summary as of 11/26/2013   INR goal 2.0-3.0  Selected INR 1.50! (11/26/2013)  Next INR check 12/10/2013  Target end date 06/02/2012   Indications  Bilateral pulmonary embolism [415.19] Long term (current) use of anticoagulants [V58.61]      Anticoagulation Episode Summary   [facility]   Preferred lab    Send INR reminders to    Comments       PATIENT INSTRUCTIONS: Patient Instructions  Patient instructed to take medications as defined in the Anti-coagulation Track section of this encounter.  Patient instructed to take today's dose.  Patient verbalized understanding of these instructions.       FOLLOW-UP Return in 2 weeks (on 12/10/2013) for Follow up INR at 1045h.  [name], III Pharm.D., CACP

## 2013-11-26 NOTE — Patient Instructions (Signed)
Patient instructed to take medications as defined in the Anti-coagulation Track section of this encounter.  Patient instructed to take today's dose.  Patient verbalized understanding of these instructions.    

## 2013-12-05 ENCOUNTER — Other Ambulatory Visit: Payer: Self-pay | Admitting: Internal Medicine

## 2013-12-05 MED ORDER — HYDROCODONE-ACETAMINOPHEN 5-325 MG PO TABS: 1.0000 | ORAL_TABLET | Freq: Four times a day (QID) | ORAL | Status: DC | PRN

## 2013-12-10 ENCOUNTER — Ambulatory Visit (INDEPENDENT_AMBULATORY_CARE_PROVIDER_SITE_OTHER): Payer: No Typology Code available for payment source | Admitting: Pharmacist

## 2013-12-10 DIAGNOSIS — I2699 Other pulmonary embolism without acute cor pulmonale: Secondary | ICD-10-CM

## 2013-12-10 DIAGNOSIS — Z7901 Long term (current) use of anticoagulants: Secondary | ICD-10-CM

## 2013-12-10 LAB — POCT INR: INR: 1.2

## 2013-12-10 NOTE — Progress Notes (Signed)
Anti-Coagulation Progress Note  Donald Brady is a 38 y.o. male who is currently on an anti-coagulation regimen.    RECENT RESULTS: Recent results are below, the most recent result is correlated with a dose of 185 mg. per week: Lab Results  Component Value Date   INR 1.20 12/10/2013   INR 1.50 11/26/2013   INR 1.06 11/20/2013    ANTI-COAG DOSE: Anticoagulation Dose Instructions as of 12/10/2013     [name] Tue Wed Thu Fri Sat   New Dose 25 mg 30 mg 30 mg 30 mg 30 mg 25 mg 25 mg    Description       As per doseresponse provided dosing instruction sheet.       ANTICOAG SUMMARY: Anticoagulation Episode Summary   Current INR goal 2.0-3.0  Next INR check 12/24/2013  INR from last check 1.20! (12/10/2013)  Weekly max dose   Target end date Indefinite  [facility]  Preferred lab   Send INR reminders to    Indications  Bilateral pulmonary embolism [415.19] Long term (current) use of anticoagulants [V58.61]        Comments         ANTICOAG TODAY: Anticoagulation Summary as of 12/10/2013   INR goal 2.0-3.0  Selected INR 1.20! (12/10/2013)  Next INR check 12/24/2013  Target end date Indefinite   Indications  Bilateral pulmonary embolism [415.19] Long term (current) use of anticoagulants [V58.61]      Anticoagulation Episode Summary   [facility]   Preferred lab    Send INR reminders to    Comments       PATIENT INSTRUCTIONS: Patient Instructions  Patient instructed to take medications as defined in the Anti-coagulation Track section of this encounter.  Patient instructed to take today's dose.  Patient verbalized understanding of these instructions.       FOLLOW-UP Return in 2 weeks (on 12/24/2013) for Follow up INR at 1030h.  [name], III Pharm.D., CACP

## 2013-12-10 NOTE — Patient Instructions (Signed)
Patient instructed to take medications as defined in the Anti-coagulation Track section of this encounter.  Patient instructed to take today's dose.  Patient verbalized understanding of these instructions.    

## 2013-12-24 ENCOUNTER — Ambulatory Visit (INDEPENDENT_AMBULATORY_CARE_PROVIDER_SITE_OTHER): Payer: No Typology Code available for payment source | Admitting: Pharmacist

## 2013-12-24 DIAGNOSIS — I2699 Other pulmonary embolism without acute cor pulmonale: Secondary | ICD-10-CM

## 2013-12-24 DIAGNOSIS — Z7901 Long term (current) use of anticoagulants: Secondary | ICD-10-CM

## 2013-12-24 LAB — POCT INR: INR: 4.3

## 2013-12-24 NOTE — Progress Notes (Signed)
Anti-Coagulation Progress Note  Donald Brady is a 38 y.o. male who is currently on an anti-coagulation regimen.    RECENT RESULTS: Recent results are below, the most recent result is correlated with a dose of 195 mg. per week: Lab Results  Component Value Date   INR 4.30 12/24/2013   INR 1.20 12/10/2013   INR 1.50 11/26/2013    ANTI-COAG DOSE: Anticoagulation Dose Instructions as of 12/24/2013     [name] Tue Wed Thu Fri Sat   New Dose 25 mg 25 mg 25 mg 30 mg 25 mg 25 mg 30 mg    Description       As per doseresponse provided dosing instruction sheet.       ANTICOAG SUMMARY: Anticoagulation Episode Summary   Current INR goal 2.0-3.0  Next INR check 01/21/2014  INR from last check 4.30! (12/24/2013)  Weekly max dose   Target end date Indefinite  [facility]  Preferred lab   Send INR reminders to    Indications  Bilateral pulmonary embolism [415.19] Long term (current) use of anticoagulants [V58.61]        Comments         ANTICOAG TODAY: Anticoagulation Summary as of 12/24/2013   INR goal 2.0-3.0  Selected INR 4.30! (12/24/2013)  Next INR check 01/21/2014  Target end date Indefinite   Indications  Bilateral pulmonary embolism [415.19] Long term (current) use of anticoagulants [V58.61]      Anticoagulation Episode Summary   [facility] check [facility]   Preferred lab    Send INR reminders to    Comments       PATIENT INSTRUCTIONS: Patient Instructions  Patient instructed to take medications as defined in the Anti-coagulation Track section of this encounter.  Patient instructed to taketoday's dose.  Patient verbalized understanding of these instructions.       FOLLOW-UP Return in 4 weeks (on 01/21/2014) for Follow up INR at 1030h.  [name], III Pharm.D., CACP

## 2013-12-24 NOTE — Patient Instructions (Signed)
Patient instructed to take medications as defined in the Anti-coagulation Track section of this encounter.  Patient instructed to take today's dose.  Patient verbalized understanding of these instructions.    

## 2014-01-04 ENCOUNTER — Other Ambulatory Visit: Payer: Self-pay | Admitting: Internal Medicine

## 2014-01-11 ENCOUNTER — Encounter: Payer: Self-pay | Admitting: Internal Medicine

## 2014-01-11 ENCOUNTER — Ambulatory Visit (INDEPENDENT_AMBULATORY_CARE_PROVIDER_SITE_OTHER): Payer: No Typology Code available for payment source | Admitting: Internal Medicine

## 2014-01-11 VITALS — BP 144/99 | HR 107 | Temp 98.3°F | Ht 75.0 in | Wt 357.8 lb

## 2014-01-11 DIAGNOSIS — IMO0002 Reserved for concepts with insufficient information to code with codable children: Secondary | ICD-10-CM

## 2014-01-11 DIAGNOSIS — G4730 Sleep apnea, unspecified: Secondary | ICD-10-CM

## 2014-01-11 DIAGNOSIS — E669 Obesity, unspecified: Secondary | ICD-10-CM

## 2014-01-11 DIAGNOSIS — I10 Essential (primary) hypertension: Secondary | ICD-10-CM

## 2014-01-11 DIAGNOSIS — M1710 Unilateral primary osteoarthritis, unspecified knee: Secondary | ICD-10-CM

## 2014-01-11 DIAGNOSIS — M1712 Unilateral primary osteoarthritis, left knee: Secondary | ICD-10-CM

## 2014-01-11 MED ORDER — LISINOPRIL 10 MG PO TABS: 10.0000 mg | ORAL_TABLET | Freq: Every day | ORAL | Status: DC
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
## 2014-01-11 NOTE — Patient Instructions (Addendum)
Follow-up in 3 months. Please remember to bring your medicines with you at that appointment.   Congratulations on your weight loss!  This is great news.   I would like to increase your lisinopril dose to keep your blood pressure under good control while you continue to lose weight.  Take 2 tabs daily of what you have.  When you run out (should be in a week and a half), I have called in the higher dose that you can pick up at the pharmacy before you leave for your trip.  We are checking a blood test today, I will let you know if the results are abnormal.    Exercise to Lose Weight Exercise and a healthy diet may help you lose weight. Your doctor may suggest specific exercises. EXERCISE IDEAS AND TIPS  Choose low-cost things you enjoy doing, such as walking, bicycling, or exercising to workout videos.  Take stairs instead of the elevator.  Walk during your lunch break.  Park your car further away from work or school.  Go to a gym or an exercise class.  Start with 5 to 10 minutes of exercise each day. Build up to 30 minutes of exercise 4 to 6 days a week.  Wear shoes with good support and comfortable clothes.  Stretch before and after working out.  Work out until you breathe harder and your heart beats faster.  Drink extra water when you exercise.  Do not do so much that you hurt yourself, feel dizzy, or get very short of breath. Exercises that burn about 150 calories:  Running 1  miles in 15 minutes.  Playing volleyball for 45 to 60 minutes.  Washing and waxing a car for 45 to 60 minutes.  Playing touch football for 45 minutes.  Walking 1  miles in 35 minutes.  Pushing a stroller 1  miles in 30 minutes.  Playing basketball for 30 minutes.  Raking leaves for 30 minutes.  Bicycling 5 miles in 30 minutes.  Walking 2 miles in 30 minutes.  Dancing for 30 minutes.  Shoveling snow for 15 minutes.  Swimming laps for 20 minutes.  Walking up stairs for 15  minutes.  Bicycling 4 miles in 15 minutes.  Gardening for 30 to 45 minutes.  Jumping rope for 15 minutes.  Washing windows or floors for 45 to 60 minutes. Document Released: 09/04/2010 Document Revised: 10/25/2011 Document Reviewed: 09/04/2010 [facility] Patient Information 2014 [place], [place].

## 2014-01-12 LAB — BASIC METABOLIC PANEL WITH GFR
BUN: 12 mg/dL (ref 6–23)
CO2: 21 mEq/L (ref 19–32)
Calcium: 9.5 mg/dL (ref 8.4–10.5)
Chloride: 105 mEq/L (ref 96–112)
Creat: 1.81 mg/dL — ABNORMAL HIGH (ref 0.50–1.35)
GFR, EST NON AFRICAN AMERICAN: 47 mL/min — AB
GFR, Est African American: 54 mL/min — ABNORMAL LOW
Glucose, Bld: 116 mg/dL — ABNORMAL HIGH (ref 70–99)
Potassium: 4.1 mEq/L (ref 3.5–5.3)
Sodium: 141 mEq/L (ref 135–145)

## 2014-01-12 NOTE — Assessment & Plan Note (Signed)
Patient had sleep study which showed severe OSA.  However, he is unable to obtain CPAP at this time due to financial constraints.  Feels he is sleeping better since losing weight and plans to continue to do so.

## 2014-01-12 NOTE — Assessment & Plan Note (Signed)
Continues to follow with sports med, receiving steroid injections.

## 2014-01-12 NOTE — Assessment & Plan Note (Signed)
Has lost 32 pounds since 09/2013.  Goal weight at 3 month follow-up 325 lbs.  Patient exercising one hour daily.

## 2014-01-12 NOTE — Assessment & Plan Note (Addendum)
BP Readings from Last 3 Encounters:  01/11/14 144/99  11/23/13 139/100  11/20/13 163/104    Lab Results  Component Value Date   NA 141 01/11/2014   K 4.1 01/11/2014   CREATININE 1.81* 01/11/2014    Assessment: Blood pressure control:  slightly elevated Progress toward BP goal:   stable Comments: Patient not taking HCTZ since lisinopril initiated at last office visit.   Plan: Medications:  continue current medications- increase lisinopril to 10 mg daily Other plans: BMP today. Follow-up in 3 months.

## 2014-01-21 ENCOUNTER — Ambulatory Visit: Payer: No Typology Code available for payment source

## 2014-01-29 ENCOUNTER — Encounter: Payer: Self-pay | Admitting: Sports Medicine

## 2014-01-29 ENCOUNTER — Ambulatory Visit (INDEPENDENT_AMBULATORY_CARE_PROVIDER_SITE_OTHER): Payer: No Typology Code available for payment source | Admitting: Sports Medicine

## 2014-01-29 VITALS — BP 145/116 | Ht 75.0 in | Wt 357.0 lb

## 2014-01-29 DIAGNOSIS — M1710 Unilateral primary osteoarthritis, unspecified knee: Secondary | ICD-10-CM

## 2014-01-29 DIAGNOSIS — IMO0002 Reserved for concepts with insufficient information to code with codable children: Secondary | ICD-10-CM

## 2014-01-29 DIAGNOSIS — M1712 Unilateral primary osteoarthritis, left knee: Secondary | ICD-10-CM

## 2014-01-29 MED ORDER — METHYLPREDNISOLONE ACETATE 80 MG/ML IJ SUSP
80.0000 mg | Freq: Once | INTRAMUSCULAR | Status: AC
  Administered 2014-01-29: 80 mg via INTRA_ARTICULAR

## 2014-01-29 NOTE — Progress Notes (Addendum)
   Subjective:    Patient ID: Donald Brady, male    DOB: 03/27/1976, 38 y.o.   MRN: [id number]  HPI Patient comes in today requesting a repeat cortisone injection into his left knee. He is leaving for a trip to [place] tomorrow. He has done well with injections in the past. No recent trauma. Symptoms are identical in nature to what he's experienced previously with his left knee DJD.    Review of Systems     Objective:   Physical Exam Obese. No acute distress  Left knee: Range of motion 0-120. Trace effusion. 1+ patellofemoral crepitus. No joint line tenderness to palpation. Negative McMurray's. Knee is grossly stable to ligamentous exam. Neurovascularly intact distally.       Assessment & Plan:  Returning left knee pain secondary to DJD  I've agreed to reinject his left knee. An anterior lateral approach was utilized. He understands that I cannot repeat this injection for several more months. An Ace wrap was applied post injection. Followup when necessary.  Consent obtained and verified. Time-out conducted. Noted no overlying erythema, induration, or other signs of local infection. Skin prepped in a sterile fashion. Topical analgesic spray: Ethyl chloride. Joint: left knee Needle: 22g 1.5 inch Completed without difficulty. Meds: 6cc 1% xylocaine, 2cc (80mg ) depomedrol  Advised to call if fevers/chills, erythema, induration, drainage, or persistent bleeding.

## 2014-03-04 ENCOUNTER — Ambulatory Visit: Payer: No Typology Code available for payment source

## 2014-03-08 ENCOUNTER — Other Ambulatory Visit: Payer: Self-pay | Admitting: *Deleted

## 2014-03-08 ENCOUNTER — Ambulatory Visit: Payer: No Typology Code available for payment source

## 2014-03-08 NOTE — Telephone Encounter (Signed)
Last refill 6/29 Call pt when ready

## 2014-03-11 MED ORDER — HYDROCODONE-ACETAMINOPHEN 5-325 MG PO TABS: 1.0000 | ORAL_TABLET | Freq: Four times a day (QID) | ORAL | Status: DC | PRN

## 2014-04-01 ENCOUNTER — Telehealth: Payer: Self-pay | Admitting: Dietician

## 2014-04-01 NOTE — Telephone Encounter (Signed)
Spoke with patient about referral /missed appointment earlier in the year and services provided by RD. He says he was doing well at decreasing his weight but feels like he reached a plateau. He agrees to make an appointment on same day as his next doctor appointment.

## 2014-04-03 ENCOUNTER — Other Ambulatory Visit: Payer: Self-pay | Admitting: *Deleted

## 2014-04-03 MED ORDER — CITALOPRAM HYDROBROMIDE 20 MG PO TABS: 20.0000 mg | ORAL_TABLET | Freq: Every day | ORAL | Status: DC

## 2014-04-03 MED ORDER — LISINOPRIL 10 MG PO TABS: 10.0000 mg | ORAL_TABLET | Freq: Every day | ORAL | Status: DC

## 2014-04-08 ENCOUNTER — Other Ambulatory Visit: Payer: Self-pay | Admitting: *Deleted

## 2014-04-08 MED ORDER — HYDROCODONE-ACETAMINOPHEN 5-325 MG PO TABS: 1.0000 | ORAL_TABLET | Freq: Four times a day (QID) | ORAL | Status: DC | PRN

## 2014-04-08 NOTE — Telephone Encounter (Signed)
#

## 2014-04-08 NOTE — Telephone Encounter (Signed)
Needs appt to see new PCP.  Refill X 1 month given to Triage.

## 2014-04-09 NOTE — Telephone Encounter (Signed)
appt done, pt aware

## 2014-04-19 ENCOUNTER — Ambulatory Visit: Payer: No Typology Code available for payment source | Admitting: Internal Medicine

## 2014-04-23 ENCOUNTER — Ambulatory Visit (INDEPENDENT_AMBULATORY_CARE_PROVIDER_SITE_OTHER): Payer: No Typology Code available for payment source | Admitting: Internal Medicine

## 2014-04-23 ENCOUNTER — Encounter: Payer: Self-pay | Admitting: Internal Medicine

## 2014-04-23 VITALS — BP 169/112 | HR 79 | Temp 98.7°F | Wt 355.5 lb

## 2014-04-23 DIAGNOSIS — R1110 Vomiting, unspecified: Secondary | ICD-10-CM | POA: Insufficient documentation

## 2014-04-23 DIAGNOSIS — R112 Nausea with vomiting, unspecified: Secondary | ICD-10-CM

## 2014-04-23 DIAGNOSIS — Z720 Tobacco use: Secondary | ICD-10-CM

## 2014-04-23 DIAGNOSIS — I10 Essential (primary) hypertension: Secondary | ICD-10-CM

## 2014-04-23 DIAGNOSIS — F17200 Nicotine dependence, unspecified, uncomplicated: Secondary | ICD-10-CM

## 2014-04-23 DIAGNOSIS — J329 Chronic sinusitis, unspecified: Secondary | ICD-10-CM | POA: Insufficient documentation

## 2014-04-23 DIAGNOSIS — Z23 Encounter for immunization: Secondary | ICD-10-CM

## 2014-04-23 DIAGNOSIS — I2699 Other pulmonary embolism without acute cor pulmonale: Secondary | ICD-10-CM

## 2014-04-23 DIAGNOSIS — E785 Hyperlipidemia, unspecified: Secondary | ICD-10-CM

## 2014-04-23 LAB — CBC WITH DIFFERENTIAL/PLATELET
BASOS ABS: 0.1 10*3/uL (ref 0.0–0.1)
Basophils Relative: 1 % (ref 0–1)
EOS ABS: 0.2 10*3/uL (ref 0.0–0.7)
EOS PCT: 3 % (ref 0–5)
HCT: 53.4 % — ABNORMAL HIGH (ref 39.0–52.0)
Hemoglobin: 18.2 g/dL — ABNORMAL HIGH (ref 13.0–17.0)
Lymphocytes Relative: 22 % (ref 12–46)
Lymphs Abs: 1.8 10*3/uL (ref 0.7–4.0)
MCH: 31 pg (ref 26.0–34.0)
MCHC: 34.1 g/dL (ref 30.0–36.0)
MCV: 91 fL (ref 78.0–100.0)
Monocytes Absolute: 0.7 10*3/uL (ref 0.1–1.0)
Monocytes Relative: 8 % (ref 3–12)
Neutro Abs: 5.4 10*3/uL (ref 1.7–7.7)
Neutrophils Relative %: 66 % (ref 43–77)
PLATELETS: 240 10*3/uL (ref 150–400)
RBC: 5.87 MIL/uL — ABNORMAL HIGH (ref 4.22–5.81)
RDW: 13.7 % (ref 11.5–15.5)
WBC: 8.2 10*3/uL (ref 4.0–10.5)

## 2014-04-23 LAB — BASIC METABOLIC PANEL WITH GFR
BUN: 10 mg/dL (ref 6–23)
CALCIUM: 9.3 mg/dL (ref 8.4–10.5)
CO2: 24 mEq/L (ref 19–32)
CREATININE: 1.67 mg/dL — AB (ref 0.50–1.35)
Chloride: 102 mEq/L (ref 96–112)
GFR, EST NON AFRICAN AMERICAN: 51 mL/min — AB
GFR, Est African American: 60 mL/min
Glucose, Bld: 110 mg/dL — ABNORMAL HIGH (ref 70–99)
Potassium: 4.6 mEq/L (ref 3.5–5.3)
Sodium: 141 mEq/L (ref 135–145)

## 2014-04-23 LAB — LIPID PANEL
CHOLESTEROL: 244 mg/dL — AB (ref 0–200)
HDL: 46 mg/dL (ref >39)
LDL Cholesterol: 144 mg/dL — ABNORMAL HIGH (ref 0–99)
Total CHOL/HDL Ratio: 5.3 Ratio
Triglycerides: 272 mg/dL — ABNORMAL HIGH (ref <150)
VLDL: 54 mg/dL — ABNORMAL HIGH (ref 0–40)

## 2014-04-23 LAB — D-DIMER, QUANTITATIVE (NOT AT ARMC): D-Dimer, Quant: 1.19 ug/mL-FEU — ABNORMAL HIGH (ref 0.00–0.48)

## 2014-04-23 MED ORDER — RANITIDINE HCL 150 MG PO TABS: 150.0000 mg | ORAL_TABLET | Freq: Two times a day (BID) | ORAL | Status: DC

## 2014-04-23 MED ORDER — OXYMETAZOLINE HCL 0.05 % NA SOLN: 1.0000 | Freq: Two times a day (BID) | NASAL | Status: AC

## 2014-04-23 MED ORDER — GUAIFENESIN-DM 100-10 MG/5ML PO SYRP: 5.0000 mL | ORAL_SOLUTION | ORAL | Status: DC | PRN

## 2014-04-23 MED ORDER — LISINOPRIL-HYDROCHLOROTHIAZIDE 10-12.5 MG PO TABS
1.0000 | ORAL_TABLET | Freq: Every day | ORAL | Status: DC
Start: 2014-04-23 — End: 2014-10-09

## 2014-04-23 NOTE — Assessment & Plan Note (Signed)
  Assessment: Progress toward smoking cessation:   Deteriorated Barriers to progress toward smoking cessation:   Social environment Comments: Patient reports previous cessation, but will smoke 1-2 cigarettes a week with friends when drinking alcohol.  Plan: Instruction/counseling given:  I counseled patient on the dangers of tobacco use, advised patient to stop smoking, and reviewed strategies to maximize success. Educational resources provided:  [employer] Insurance account manager) brochure Self management tools provided:  smoking cessation plan (STAR Quit Plan) Medications to assist with smoking cessation:  None Patient agreed to the following self-care plans for smoking cessation: go to the [employer] (www.quitlinenc.com);call [employer] (1-800-QUIT-NOW);cut down the number of cigarettes smoked

## 2014-04-23 NOTE — Assessment & Plan Note (Addendum)
Assessment: Please see HPI for history. Nausea and vomiting are most likely secondary to GERD. Patient denies reflux symptoms, chest pain or a burning sensation, but CT chest showed potential eccentric wall thickening involving the distal esophagus and bronchoscopy in April 2014 revealed some mild erythema and induration on vocal cords suggestive of reflux. Other differentials include viral gastroenteritis (however, no fever, chills, diarrhea or abdominal pain, WBC 8.2), eosinophilic esophagitis (doubt since no abdominal pain, chest pain, dysphagia or odynophagia), PUD (doubt since patient denies abdominal or epigastric pain, weight loss or weight gain, denies NSAID use), and Marijuana induced emesis (less likely since patient infrequently uses marijuana 1-2 times per month). Pancreatitis is less likely since patient only drinks 2-3 beers on the weekends and denies any epigastric pain.   Plan: I prescribed the patient Ranitidine 150 mg BID for GERD induced nausea and vomiting. We also made him a referral to Gastroenterology for further work up to address CT and Bronchoscopy findings. I advised the patient to stop drinking and using marijuana. We also checked a CBC with differential, and BMET which showed:    Ref. Range 04/23/2014 11:25  Sodium Latest Range: 135-145 mEq/L 141  Potassium Latest Range: 3.5-5.3 mEq/L 4.6  Chloride Latest Range: 96-112 mEq/L 102  CO2 Latest Range: 19-32 mEq/L 24  BUN Latest Range: 6-23 mg/dL 10  Creatinine Latest Range: 0.50-1.35 mg/dL 1.67 (H)  Calcium Latest Range: 8.4-10.5 mg/dL 9.3  Glucose Latest Range: 70-99 mg/dL 110 (H)  GFR, Est African American No range found 60  GFR, Est Non African American No range found 51 (L)     Ref. Range 04/23/2014 11:25  WBC Latest Range: 4.0-10.5 K/uL 8.2  RBC Latest Range: 4.22-5.81 MIL/uL 5.87 (H)  Hemoglobin Latest Range: 13.0-17.0 g/dL 18.2 (H)  HCT Latest Range: 39.0-52.0 % 53.4 (H)  MCV Latest Range: 78.0-100.0 fL 91.0  MCH  Latest Range: 26.0-34.0 pg 31.0  MCHC Latest Range: 30.0-36.0 g/dL 34.1  RDW Latest Range: 11.5-15.5 % 13.7  Platelets Latest Range: 150-400 K/uL 240

## 2014-04-23 NOTE — Assessment & Plan Note (Signed)
Assessment: Last lipid profile from April 2013 showed Total cholesterol 215, TG 194, HDL 39, LDL 137. Patient is not currently on a cholesterol medication.   Plan: We will recheck a lipid profile and place the patient on appropriate therapy if needed.

## 2014-04-23 NOTE — Assessment & Plan Note (Addendum)
Assessment: Patient has a history of bilateral pulmonary embolism on 11/27/11. CT angio chest on that date showed bilateral pulmonary embolus, with acute components and possibly with chronic components, with moderate to large clot burden. The PE was thought to be provoked from tobacco use and a recent long car ride. He also has a family history of blood clots. Patient was treated with lovenox and coumadin. Patient followed with Dr. Elie Confer, but his INR was frequently sub-therapeutic and patient missed several appointments. Patient was seen in the ED 04/2012 for recurrent chest pain and shortness of breath, but CT angio chest showed no evidence of pulmonary embolism. Patient was referred to Pulmonology for evaluation of chronic PE. Patient was seen by Dr. Lake Bells who was unsure if his PE was idiopathic or provoked, since although he had been on a 3 hour car trip prior to the PE, he never had trouble in his frequent 6+ hour drives to [place]. Dr. [place] Bells worried about chronic thromboembolic disease and stated if he had an idiopathic PE or if he had CTEPH then he would need lifelong anticoagulation. Patient had a perfusion study of his lungs to look for chronic PE which was normal. He continued to follow with Dr. [place] Bells who stated that based on the size of his previous PE and the fact that there appeared to be a chronic component, he needed to be on chronic anticoagulation. Patient has not taken his warfarin in about 5 months nor has he followed up with Dr. Elie Confer. Patient states it is too hard for him to travel into [place] weekly for INR checks. He should be on Coumadin 25 mg Sunday through Friday and 30 mg on Saturday. Coumadin therapy has been poorly tolerated in the past with many low and high INRs. The plan was for patient to switch to Xarelto. Patient should be able to get Xarelto via [facility].   Plan: Recheck D-dimer as it appears from V/Q scan that patient does not have chronic PE. I will discuss this case with  Dr. Eppie Gibson and value his input on whether the patient should be on lifelong anticoagulation. If patient does require lifelong anticoagulation, we can consider starting the patient on Xarelto to improve compliance.   Addendum: Patient's D-dimer was elevated at 1.19. After further discussion with Dr. Eppie Gibson, it is recommended that the patient be on lifelong anticoagulation therapy. Patient can obtain Xarelto through [facility] if the patient is in agreement with the plan and is willing to be compliant with the medication. This result and the recommendation should be conveyed to the patient during his next appointment. At this time, the patient can decide if he is willing to proceed.

## 2014-04-23 NOTE — Progress Notes (Addendum)
Subjective:    Patient ID: Donald Brady, male    DOB: 07/14/1976, 38 y.o.   MRN: [id number]  HPI Donald Brady is a 38 yo male with PMHx of HTN, HLD, bilateral PE on coumadin, and OSA who presents to the clinic day for routine follow up. Patient complains of nausea and vomiting for 15 days. Nausea and vomiting occur about 3-4 times a day when he wakes up and 1 hour after he eats. The vomitus is non-bloody, non-bilious, mostly liquid and phlegm. It is NOT associated with fever, chills, fatigue, weakness, abdominal pain, decreased appetite, hematemesis, dysphagia, odynophagia, reflux, chest pain, shortness of breath, constipation, diarrhea, melena, hematochezia or abdominal distention. He denies recent weight loss. His nausea and vomiting is not related to a change in food or medications. He only drink 2-3 beers on the weekend. He does report marijuana use, once every 2-3 weeks. Last use was 2 weeks ago. He has not changed his daily routine and continues to work out. Nausea and vomiting due not wake him up from sleep. He does report his throat and abdominal muscles are sore from vomiting.   Patient also complains of sinus congestion, sinus pressure and headache and a productive cough. Patient states this is chronic in nature for him and is unsure if it has worsened in the past few weeks. He denies fever or chills. Phlegm is yellow. He has tried nasal steroids in the past without relief. He denies any ear pain.  Patient has a history of bilateral pulmonary embolism diagnosed in April of 2013. Patient states he had a recurrent embolism several months later. Patient has been on coumadin since January of 2015 and followed with Donald Brady. Patient was previously thought to be a candidate for lifelong coumadin due to possible diagnosis of chronic PE on CT Chest. Patient states he has not seen Donald Brady since May 2015 and has not been taking his coumadin because it has been too much of a hassle to drive from [place]  to [place] once a week. He denies shortness of breath or chest pain, swelling in his legs or calf tenderness.   Review of Systems General: Denies fever, chills, fatigue, change in appetite and diaphoresis.  Respiratory: Admits to productive cough. Denies SOB, DOE, chest tightness, and wheezing.   Cardiovascular: Denies chest pain and palpitations.  Gastrointestinal: Admits to nausea, vomiting. Denies abdominal pain, dysphagia, odynophagia, diarrhea, constipation, blood in stool and abdominal distention.  Genitourinary: Denies dysuria, urgency, frequency, hematuria, suprapubic pain and flank pain. Endocrine: Denies hot or cold intolerance, polyuria, and polydipsia. Musculoskeletal: Denies myalgias, back pain, joint swelling, arthralgias and gait problem.  Skin: Denies pallor, rash and wounds.  Neurological: Admits to headache. Denies dizziness, weakness, lightheadedness, numbness,seizures, and syncope, Psychiatric/Behavioral: Denies mood changes, confusion, nervousness, sleep disturbance and agitation.     Objective:   Physical Exam Filed Vitals:   04/23/14 1009  BP: 165/114  Pulse: 86  Temp: 98.7 F (37.1 C)  TempSrc: Oral  Weight: 355 lb 8 oz (161.254 kg)  SpO2: 98%   General: Vital signs reviewed.  Patient is well-developed and well-nourished, in no acute distress and cooperative with exam.  Head: Normocephalic and atraumatic. Erythematous, edematous nasal turbinates.  Eyes: EOMI, conjunctivae normal, no scleral icterus.  Neck: Supple, trachea midline, normal ROM, no JVD, masses, thyromegaly, or carotid bruit present.  Cardiovascular: RRR, S1 normal, S2 normal, no murmurs, gallops, or rubs. Pulmonary/Chest: Clear to auscultation bilaterally, no wheezes, rales, or rhonchi. Abdominal: Soft, non-tender, non-distended,  obese, BS +, no masses, organomegaly, or guarding present.  Musculoskeletal: No joint deformities, erythema, or stiffness, ROM full and nontender. Extremities: No  lower extremity edema bilaterally, no calf tenderness,  pulses symmetric and intact bilaterally. No cyanosis or clubbing.     Assessment & Brady:   Please see problem based assessment and Brady.   Internal Medicine Clinic Attending  I saw and evaluated the patient.  I personally confirmed the key portions of the history and exam documented by Donald Brady and I reviewed pertinent patient test results.  The assessment, diagnosis, and Brady were formulated together and I agree with the documentation in the resident's note.  Larey Dresser, MD 05/06/2014

## 2014-04-23 NOTE — Assessment & Plan Note (Signed)
BP Readings from Last 3 Encounters:  04/23/14 169/112  01/29/14 145/116  01/11/14 144/99    Lab Results  Component Value Date   NA 141 01/11/2014   K 4.1 01/11/2014   CREATININE 1.81* 01/11/2014    Assessment: Blood pressure control:  Uncontrolled Progress toward BP goal:   Deteriorated Comments: Patient was no longer taking HCTZ 25 mg daily. He was supposed to increase his Lisinopril from 5 mg to 10 mg daily from his last appointment in May 2015, but did not understand the change. He has been taking only Lisinopril 5 mg daily.   Plan: Medications: I have discontinued HCTZ and Lisinopril and started him on a combination pill of Lisinopril-HCTZ 10-12.5 mg daily Educational resources provided: brochure Self management tools provided:   Other plans: Patient will return for a BP recheck in 2-3 weeks.

## 2014-04-23 NOTE — Assessment & Plan Note (Signed)
Assessment: Please see HPI for full history. Chronic sinusitis with nasal congestion, sinus pressure and headache and productive cough. Patient has previously tried allergy medication and nasal glucocorticoid sprays without relief.  Plan: Robitussin DM and Afrin for 3 days. If symptoms worsen or do not improve, consider bacterial sinus infection.

## 2014-04-23 NOTE — Patient Instructions (Signed)
General Instructions:   Please bring your medicines with you each time you come to clinic.  Medicines may include prescription medications, over-the-counter medications, herbal remedies, eye drops, vitamins, or other pills.  Nausea and Vomiting: We have added a new prescription Ranitidine 150 mg twice a day for reflux. This may help your nausea and vomiting. If it does not help, we can discontinue it in the future. We will also set you up with a gastroenterologist referral for further assessment of your symptoms. Make sure your drink plenty of fluids to avoid dehydration. Please avoid alcohol and marijuana. If your symptoms worsen, please call the clinic or go to the Emergency Department.  Sinus Congestion and Cough: I have added two new prescriptions to try: Robitussin for cough and Afrin nasal spray for congestion. Please only use the Afrin for 3 days! Use longer than 3 days can cause increased congestion.  Pulmonary Embolism: You can stop taking your Warfarin for now. We will check your labs and continue researching your past medical history to determine if you still need to be on Warfarin. We will be contacting you soon. If you experience shortness of breath or chest pain, please go to the Emergency Department.  High Blood Pressure: I have discontinue your Lisinopril and your HCTZ and added you on a combination medication called Lisinopril-HCTZ 10-12.5mg . Please take one pill a day. We will recheck your blood pressure at your next appointment.  Self Care Goals & Plans:  Self Care Goal 04/23/2014  Manage my medications take my medicines as prescribed; bring my medications to every visit; refill my medications on time  Monitor my health bring my blood pressure log to each visit; keep track of my blood pressure  Eat healthy foods eat baked foods instead of fried foods; eat foods that are low in salt  Be physically active find an activity I enjoy; find a convenient safe place to exercise  Stop  smoking go to the [employer] website (https://scott-booker.info/); call [employer] (1-800-QUIT-NOW); cut down the number of cigarettes smoked  Prevent falls -   Pulmonary Embolism A pulmonary (lung) embolism (PE) is a blood clot that has traveled to the lung and results in a blockage of blood flow in the affected lung. Most clots come from deep veins in the legs or pelvis. PE is a dangerous and potentially life-threatening condition that can be treated if identified. CAUSES Blood clots form in a vein for different reasons. Usually several things cause blood clots. They include:  The flow of blood slows down.  The inside of the vein is damaged in some way.  The person has a condition that makes the blood clot more easily. RISK FACTORS Some people are more likely than others to develop PE. Risk factors include:   Smoking.  Being overweight (obese).  Sitting or lying still for a long time. This includes long-distance travel, paralysis, or recovery from an illness or surgery. Other factors that increase risk are:   Older age, especially over 58 years of age.  Having a family history of blood clots or if you have already had a blood clot.  Having major or lengthy surgery. This is especially true for surgery on the hip, knee, or belly (abdomen). Hip surgery is particularly high risk.  Having a long, thin tube (catheter) placed inside a vein during a medical procedure.  Breaking a hip or leg.  Having cancer or cancer treatment.  Medicines containing the male hormone estrogen. This includes birth control pills and hormone replacement  therapy.  Other circulation or heart problems.  Pregnancy and childbirth.  Hormone changes make the blood clot more easily during pregnancy.  The fetus puts pressure on the veins of the pelvis.  There is a risk of injury to veins during delivery or a caesarean delivery. The risk is highest just after childbirth.  PREVENTION   Exercise the legs  regularly. Take a brisk 30 minute walk every day.  Maintain a weight that is appropriate for your height.  Avoid sitting or lying in bed for long periods of time without moving your legs.  Women, particularly those over the age of 74 years, should consider the risks and benefits of taking estrogen medicines, including birth control pills.  Do not smoke, especially if you take estrogen medicines.  Long-distance travel can increase your risk. You should exercise your legs by walking or pumping the muscles every hour.  Many of the risk factors above relate to situations that exist with hospitalization, either for illness, injury, or elective surgery. Prevention may include medical and nonmedical measures.   Your health care provider will assess you for the need for venous thromboembolism prevention when you are admitted to the hospital. If you are having surgery, your surgeon will assess you the day of or day after surgery.  SYMPTOMS  The symptoms of a PE usually start suddenly and include:  Shortness of breath.  Coughing.  Coughing up blood or blood-tinged mucus.  Chest pain. Pain is often worse with deep breaths.  Rapid heartbeat. DIAGNOSIS  If a PE is suspected, your health care provider will take a medical history and perform a physical exam. Other tests that may be required include:  Blood tests, such as studies of the clotting properties of your blood.  Imaging tests, such as ultrasound, CT, MRI, and other tests to see if you have clots in your legs or lungs.  An electrocardiogram. This can look for heart strain from blood clots in the lungs. TREATMENT   The most common treatment for a PE is blood thinning (anticoagulant) medicine, which reduces the blood's tendency to clot. Anticoagulants can stop new blood clots from forming and old clots from growing. They cannot dissolve existing clots. Your body does this by itself over time. Anticoagulants can be given by mouth,  through an intravenous (IV) tube, or by injection. Your health care provider will determine the best program for you.  Less commonly, clot-dissolving medicines (thrombolytics) are used to dissolve a PE. They carry a high risk of bleeding, so they are used mainly in severe cases.  Very rarely, a blood clot in the leg needs to be removed surgically.  If you are unable to take anticoagulants, your health care provider may arrange for you to have a filter placed in a main vein in your abdomen. This filter prevents clots from traveling to your lungs. HOME CARE INSTRUCTIONS   Take all medicines as directed by your health care provider.  Learn as much as you can about DVT.  Wear a medical alert bracelet or carry a medical alert card.  Ask your health care provider how soon you can go back to normal activities. It is important to stay active to prevent blood clots. If you are on anticoagulant medicine, avoid contact sports.  It is very important to exercise. This is especially important while traveling, sitting, or standing for long periods of time. Exercise your legs by walking or by tightening and relaxing your leg muscles regularly. Take frequent walks.  You may  need to wear compression stockings. These are tight elastic stockings that apply pressure to the lower legs. This pressure can help keep the blood in the legs from clotting. Taking Warfarin Warfarin is a daily medicine that is taken by mouth. Your health care provider will advise you on the length of treatment (usually 3-6 months, sometimes lifelong). If you take warfarin:  Understand how to take warfarin and foods that can affect how warfarin works in Veterinary surgeon.  Too much and too little warfarin are both dangerous. Too much warfarin increases the risk of bleeding. Too little warfarin continues to allow the risk for blood clots. Warfarin and Regular Blood Testing While taking warfarin, you will need to have regular blood tests to  measure your blood clotting time. These blood tests usually include both the prothrombin time (PT) and international normalized ratio (INR) tests. The PT and INR results allow your health care provider to adjust your dose of warfarin. It is very important that you have your PT and INR tested as often as directed by your health care provider.  Warfarin and Your Diet Avoid major changes in your diet, or notify your health care provider before changing your diet. Arrange a visit with a registered dietitian to answer your questions. Many foods, especially foods high in vitamin K, can interfere with warfarin and affect the PT and INR results. You should eat a consistent amount of foods high in vitamin K. Foods high in vitamin K include:   Spinach, kale, broccoli, cabbage, collard and turnip greens, Brussels sprouts, peas, cauliflower, seaweed, and parsley.  Beef and pork liver.  Green tea.  Soybean oil. Warfarin with Other Medicines Many medicines can interfere with warfarin and affect the PT and INR results. You must:  Tell your health care provider about any and all medicines, vitamins, and supplements you take, including aspirin and other over-the-counter anti-inflammatory medicines. Be especially cautious with aspirin and anti-inflammatory medicines. Ask your health care provider before taking these.  Do not take or discontinue any prescribed or over-the-counter medicine except on the advice of your health care provider or pharmacist. Warfarin Side Effects Warfarin can have side effects, such as easy bruising and difficulty stopping bleeding. Ask your health care provider or pharmacist about other side effects of warfarin. You will need to:  Hold pressure over cuts for longer than usual.  Notify your dentist and other health care providers that you are taking warfarin before you undergo any procedures where bleeding may occur. Warfarin with Alcohol and Tobacco   Drinking alcohol frequently  can increase the effect of warfarin, leading to excess bleeding. It is best to avoid alcoholic drinks or consume only very small amounts while taking warfarin. Notify your health care provider if you change your alcohol intake.  Do not use any tobacco products including cigarettes, chewing tobacco, or electronic cigarettes. If you smoke, quit. Ask your health care provider for help with quitting smoking. Alternative Medicines to Warfarin: Factor Xa Inhibitor Medicines  These blood thinning medicines are taken by mouth, usually for several weeks or longer. It is important to take the medicine every single day, at the same time each day.  There are no regular blood tests required when using these medicines.  There are fewer food and drug interactions than with warfarin.  The side effects of this class of medicine is similar to that of warfarin, including excessive bruising or bleeding. Ask your health care provider or pharmacist about other potential side effects. SEEK MEDICAL CARE IF:  You notice a rapid heartbeat.  You feel weaker or more tired than usual.  You feel faint.  You notice increased bruising.  Your symptoms are not getting better in the time expected.  You are having side effects of medicine. SEEK IMMEDIATE MEDICAL CARE IF:   You have chest pain.  You have trouble breathing.  You have new or increased swelling or pain in one leg.  You cough up blood.  You notice blood in vomit, in a bowel movement, or in urine.  You have a fever. Symptoms of PE may represent a serious problem that is an emergency. Do not wait to see if the symptoms will go away. Get medical help right away. Call your local emergency services (911 in the [place]). Do not drive yourself to the hospital. Document Released: 07/30/2000 Document Revised: 12/17/2013 Document Reviewed: 08/13/2013 [facility] Patient Information 2015 [place], [place]. This information is not intended to replace advice  given to you by your health care provider. Make sure you discuss any questions you have with your health care provider.   Gastroesophageal Reflux Disease, Adult Gastroesophageal reflux disease (GERD) happens when acid from your stomach flows up into the esophagus. When acid comes in contact with the esophagus, the acid causes soreness (inflammation) in the esophagus. Over time, GERD may create small holes (ulcers) in the lining of the esophagus. CAUSES   Increased body weight. This puts pressure on the stomach, making acid rise from the stomach into the esophagus.  Smoking. This increases acid production in the stomach.  Drinking alcohol. This causes decreased pressure in the lower esophageal sphincter (valve or ring of muscle between the esophagus and stomach), allowing acid from the stomach into the esophagus.  Late evening meals and a full stomach. This increases pressure and acid production in the stomach.  A malformed lower esophageal sphincter. Sometimes, no cause is found. SYMPTOMS   Burning pain in the lower part of the mid-chest behind the breastbone and in the mid-stomach area. This may occur twice a week or more often.  Trouble swallowing.  Sore throat.  Dry cough.  Asthma-like symptoms including chest tightness, shortness of breath, or wheezing. DIAGNOSIS  Your caregiver may be able to diagnose GERD based on your symptoms. In some cases, X-rays and other tests may be done to check for complications or to check the condition of your stomach and esophagus. TREATMENT  Your caregiver may recommend over-the-counter or prescription medicines to help decrease acid production. Ask your caregiver before starting or adding any new medicines.  HOME CARE INSTRUCTIONS   Change the factors that you can control. Ask your caregiver for guidance concerning weight loss, quitting smoking, and alcohol consumption.  Avoid foods and drinks that make your symptoms worse, such as:  Caffeine  or alcoholic drinks.  Chocolate.  Peppermint or mint flavorings.  Garlic and onions.  Spicy foods.  Citrus fruits, such as oranges, lemons, or limes.  Tomato-based foods such as sauce, chili, salsa, and pizza.  Fried and fatty foods.  Avoid lying down for the 3 hours prior to your bedtime or prior to taking a nap.  Eat small, frequent meals instead of large meals.  Wear loose-fitting clothing. Do not wear anything tight around your waist that causes pressure on your stomach.  Raise the head of your bed 6 to 8 inches with wood blocks to help you sleep. Extra pillows will not help.  Only take over-the-counter or prescription medicines for pain, discomfort, or fever as directed by your  caregiver.  Do not take aspirin, ibuprofen, or other nonsteroidal anti-inflammatory drugs (NSAIDs). SEEK IMMEDIATE MEDICAL CARE IF:   You have pain in your arms, neck, jaw, teeth, or back.  Your pain increases or changes in intensity or duration.  You develop nausea, vomiting, or sweating (diaphoresis).  You develop shortness of breath, or you faint.  Your vomit is green, yellow, black, or looks like coffee grounds or blood.  Your stool is red, bloody, or black. These symptoms could be signs of other problems, such as heart disease, gastric bleeding, or esophageal bleeding. MAKE SURE YOU:   Understand these instructions.  Will watch your condition.  Will get help right away if you are not doing well or get worse. Document Released: 05/12/2005 Document Revised: 10/25/2011 Document Reviewed: 02/19/2011 [facility] Patient Information 2015 [place], [place]. This information is not intended to replace advice given to you by your health care provider. Make sure you discuss any questions you have with your health care provider.

## 2014-04-29 ENCOUNTER — Other Ambulatory Visit: Payer: Self-pay | Admitting: Internal Medicine

## 2014-04-29 ENCOUNTER — Telehealth: Payer: Self-pay | Admitting: Internal Medicine

## 2014-04-29 DIAGNOSIS — E785 Hyperlipidemia, unspecified: Secondary | ICD-10-CM

## 2014-04-29 MED ORDER — LOVASTATIN 20 MG PO TABS: 20.0000 mg | ORAL_TABLET | Freq: Every day | ORAL | Status: DC

## 2014-04-29 NOTE — Telephone Encounter (Signed)
Called Donald Brady to discuss lab results. I informed patient I sent him in a prescription for lovastatin 20 mg QD for elevated cholesterol, LDL, and TG. Patient is continuing to have N/V. Patient has follow up appointment on October 7th. I recommend to him that he be seen by our clinic or to go the ED if he feels worse, but patient does not feel he needs to be seen or go the ED at this time. He has not sent in his referral letter to gastroenterology yet.

## 2014-05-01 ENCOUNTER — Encounter: Payer: Self-pay | Admitting: *Deleted

## 2014-05-02 ENCOUNTER — Telehealth: Payer: Self-pay | Admitting: *Deleted

## 2014-05-02 NOTE — Telephone Encounter (Signed)
Pt calls and states dr Marvel Plan told him to call and leave his status, he has filled his second prescription and he is no better, continues w/ N&V. You may call him at [phone]

## 2014-05-03 NOTE — Telephone Encounter (Signed)
Called patient back and he is feeling better. He will call back on Monday to inquire about his GI referral.

## 2014-05-06 ENCOUNTER — Other Ambulatory Visit: Payer: Self-pay | Admitting: *Deleted

## 2014-05-06 NOTE — Telephone Encounter (Signed)
#

## 2014-05-07 ENCOUNTER — Other Ambulatory Visit: Payer: Self-pay | Admitting: Internal Medicine

## 2014-05-07 DIAGNOSIS — M1712 Unilateral primary osteoarthritis, left knee: Secondary | ICD-10-CM

## 2014-05-07 MED ORDER — HYDROCODONE-ACETAMINOPHEN 5-325 MG PO TABS: 1.0000 | ORAL_TABLET | Freq: Four times a day (QID) | ORAL | Status: DC | PRN

## 2014-05-08 ENCOUNTER — Other Ambulatory Visit: Payer: Self-pay | Admitting: Oncology

## 2014-05-08 DIAGNOSIS — I2699 Other pulmonary embolism without acute cor pulmonale: Secondary | ICD-10-CM

## 2014-05-08 MED ORDER — HYDROCODONE-ACETAMINOPHEN 10-325 MG PO TABS: 1.0000 | ORAL_TABLET | ORAL | Status: AC | PRN

## 2014-05-09 ENCOUNTER — Encounter: Payer: Self-pay | Admitting: Gastroenterology

## 2014-05-13 ENCOUNTER — Encounter: Payer: Self-pay | Admitting: Sports Medicine

## 2014-05-13 ENCOUNTER — Ambulatory Visit (INDEPENDENT_AMBULATORY_CARE_PROVIDER_SITE_OTHER): Payer: No Typology Code available for payment source | Admitting: Sports Medicine

## 2014-05-13 VITALS — BP 155/105 | HR 84 | Ht 75.0 in | Wt 340.0 lb

## 2014-05-13 DIAGNOSIS — M1712 Unilateral primary osteoarthritis, left knee: Secondary | ICD-10-CM

## 2014-05-13 DIAGNOSIS — M1710 Unilateral primary osteoarthritis, unspecified knee: Secondary | ICD-10-CM

## 2014-05-13 MED ORDER — METHYLPREDNISOLONE ACETATE 40 MG/ML IJ SUSP
40.0000 mg | Freq: Once | INTRAMUSCULAR | Status: AC
  Administered 2014-05-13: 40 mg via INTRA_ARTICULAR

## 2014-05-13 NOTE — Progress Notes (Signed)
   Subjective:    Patient ID: Donald Brady, male    DOB: [birth year], 38 y.o.   MRN: [id number]  HPI Patient comes in today with returning left knee pain. He has a documented history of left knee DJD. Last set of x-rays were done in December 2014. Those x-rays showed moderately advanced lateral compartmental DJD. He has done well with cortisone injections in the past. It has been 3 months since his last injection. He is getting ready to leave for a trip to [place] next week. He would like to go ahead with a repeat injection.    Review of Systems     Objective:   Physical Exam Overweight. No acute distress. Awake alert and oriented x3  Left knee: Range of motion 0-120. Trace effusion. Tender to palpation along medial lateral joint lines. Negative McMurray's. Good joint stability. Neurovascularly intact distally.       Assessment & Plan:  Returning left knee pain secondary to lateral compartmental DJD  Patient's left knee is re- injected today with cortisone. An anterior lateral approach was utlilized. He tolerated this without difficulty. I would like for him to get some updated x-rays of his knee this week. He understands definitive treatment is a total knee arthroplasty. He is in the process of working with financial assistance at [facility] in [place]. Followup when necessary.  Consent obtained and verified. Time-out conducted. Noted no overlying erythema, induration, or other signs of local infection. Skin prepped in a sterile fashion. Topical analgesic spray: Ethyl chloride. Joint: left knee Needle: 22g 1.5 inch Completed without difficulty. Meds: 3cc 1% xylocaine, 1cc (40mg ) depomedrol  Advised to call if fevers/chills, erythema, induration, drainage, or persistent bleeding.

## 2014-05-15 ENCOUNTER — Encounter: Payer: Self-pay | Admitting: Internal Medicine

## 2014-05-15 ENCOUNTER — Other Ambulatory Visit: Payer: Self-pay | Admitting: Oncology

## 2014-05-15 ENCOUNTER — Ambulatory Visit (INDEPENDENT_AMBULATORY_CARE_PROVIDER_SITE_OTHER): Payer: No Typology Code available for payment source | Admitting: Internal Medicine

## 2014-05-15 VITALS — BP 153/90 | HR 95 | Temp 98.1°F | Ht 75.0 in | Wt 348.8 lb

## 2014-05-15 DIAGNOSIS — I10 Essential (primary) hypertension: Secondary | ICD-10-CM

## 2014-05-15 DIAGNOSIS — I2699 Other pulmonary embolism without acute cor pulmonale: Secondary | ICD-10-CM

## 2014-05-15 DIAGNOSIS — J329 Chronic sinusitis, unspecified: Secondary | ICD-10-CM

## 2014-05-15 LAB — PROTIME-INR
INR: 0.99 (ref <1.50)
Prothrombin Time: 13.1 seconds (ref 11.6–15.2)

## 2014-05-15 MED ORDER — RIVAROXABAN 20 MG PO TABS: 20.0000 mg | ORAL_TABLET | Freq: Every day | ORAL | Status: DC

## 2014-05-15 MED ORDER — RIVAROXABAN 15 MG PO TABS: 15.0000 mg | ORAL_TABLET | Freq: Two times a day (BID) | ORAL | Status: DC

## 2014-05-15 NOTE — Assessment & Plan Note (Signed)
BP Readings from Last 3 Encounters:  05/15/14 153/90  05/13/14 155/105  04/23/14 169/112    Lab Results  Component Value Date   NA 141 04/23/2014   K 4.6 04/23/2014   CREATININE 1.67* 04/23/2014    Assessment: Blood pressure control:  Uncontrolled Progress toward BP goal:   Not at goal Comments: Pt say she checks his bp at home- reads- 703E systolic, and 035C diastolic. Not excactly sure about pts recall. Compliant with Lisinopril- HCTZ- 10- 12.5mg .  Plan: Medications:  Will increase meds- Start lisinopril- HCTZ- 20- 25mg . Pt told to take 2 tablets of what he is taking now ( the 10-12.5) , and when this runs out to start single pills- prescription sent in. Educational resources provided: Therapist, sports tools provided:   Other plans: Pt to come in a month with BP log.

## 2014-05-15 NOTE — Patient Instructions (Addendum)
General Instructions: We will be increasing the dose of your blood pressure medications, take 20-25mg  once a day. Before you refill your medication, take two of the tablets you have presently once a day, it you have finished it.  For your [facility] with one 15mg  tablet two times a day, for 21 days, and then take one 20mg  tablet once a day after completing the 21 days.  Remember you can get the first one year of xarelto free.   Please check your blood pressure every day. Write your blood pressure down and bring it to clinic at your next visit.   Please bring your medicines with you each time you come to clinic.  Medicines may include prescription medications, over-the-counter medications, herbal remedies, eye drops, vitamins, or other pills.
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
## 2014-05-15 NOTE — Assessment & Plan Note (Addendum)
Pt today wants to start [facility]. Looking back at chart it appears there hasn't been a full work up for pt to rule out thrombophilias. Since pt has been without any anticoagulant, before initiating, will work pt up for this. Explained to the pt that this is going to cost a lot a she has no insurance, but talking to my attending- Dr Beryle Beams, this is necessary considering the every high doses of warfarin pt was on 45mg   Daily, with up to 310 mg weekly, with INRs drastically changing from 1 to 5.7, concerning for Antiphospholipid antibody. Will therefore order labs, pt voiced understanding and agreed.  Plan- Antithrombin III Prothrombin  Lupus anticoag Protein C and S Factor V leiden Anticardiolipin Beta2 glycoprotein antibodies INR Prescription for Xalreto given to pt, 15mg  BID and then 20mg  daily, pt counseled he has to take it everyday at the same time. Pt voiced understanding and agreed.  Addendum- 05/20/2014- Tried contacting patient about results- Positive for antiphospholipid heterozygosity, which causes an increased risks of clot.

## 2014-05-16 LAB — LUPUS ANTICOAGULANT PANEL
DRVVT 1 1 MIX: 37.9 s (ref <42.9)
DRVVT: 43.1 secs — ABNORMAL HIGH (ref <42.9)
Lupus Anticoagulant: NOT DETECTED
PTT Lupus Anticoagulant: 36.5 secs (ref 28.0–43.0)

## 2014-05-16 LAB — PROTEIN C ACTIVITY: Protein C Activity: 127 % (ref 75–133)

## 2014-05-16 LAB — PROTEIN S ACTIVITY: PROTEIN S ACTIVITY: 100 % (ref 69–129)

## 2014-05-16 LAB — ANTITHROMBIN III: AntiThromb III Func: 87 % (ref 76–126)

## 2014-05-16 NOTE — Progress Notes (Signed)
Medicine attending: Medical history, presenting problems, physical findings, and medications, reviewed with resident physician Dr. Denton Brick. This is a young man who suffered an unprovoked pulmonary embolus 2 years ago. He has limited financial resources. He has never had laboratory testing to better understand whether he has a congenital or acquired coagulopathy. He showed Coumadin resistance with doses up to 45 mg daily to get into the therapeutic range and wide fluctuations in his prothrombin times  suggesting that he might have underlying antiphospholipid antibody syndrome. He recently stopped all anticoagulation on his own. Today would be an optimal time to do a coagulation profile but again do to financial constraints, we will only be able to obtain limited laboratory studies. We will concentrate on a lupus anticoagulant/antiphospholipid antibody panel. He will be started on rivaroxaban at this time. We should be able to get at least one year provided by the pharmaceutical company for free and then assist the patient in getting financial aid subsequent to that. Murriel Hopper, M.D., [place]

## 2014-05-17 LAB — BETA-2 GLYCOPROTEIN ANTIBODIES
BETA 2 GLYCO I IGG: 5 G Units (ref <20)
BETA-2-GLYCOPROTEIN I IGA: 6 A Units (ref <20)
Beta-2-Glycoprotein I IgM: 5 M Units (ref <20)

## 2014-05-17 LAB — CARDIOLIPIN ANTIBODY: Phospholipids: 259 mg/dL (ref 151–264)

## 2014-05-19 LAB — FACTOR 5 LEIDEN

## 2014-05-20 ENCOUNTER — Other Ambulatory Visit (INDEPENDENT_AMBULATORY_CARE_PROVIDER_SITE_OTHER): Payer: No Typology Code available for payment source

## 2014-05-20 ENCOUNTER — Ambulatory Visit (INDEPENDENT_AMBULATORY_CARE_PROVIDER_SITE_OTHER): Payer: No Typology Code available for payment source | Admitting: Gastroenterology

## 2014-05-20 ENCOUNTER — Encounter: Payer: Self-pay | Admitting: Gastroenterology

## 2014-05-20 VITALS — BP 138/96 | HR 100 | Ht 73.0 in | Wt 334.4 lb

## 2014-05-20 DIAGNOSIS — R197 Diarrhea, unspecified: Secondary | ICD-10-CM

## 2014-05-20 DIAGNOSIS — R1013 Epigastric pain: Secondary | ICD-10-CM

## 2014-05-20 DIAGNOSIS — R112 Nausea with vomiting, unspecified: Secondary | ICD-10-CM

## 2014-05-20 LAB — COMPREHENSIVE METABOLIC PANEL
ALBUMIN: 4.2 g/dL (ref 3.5–5.2)
ALT: 73 U/L — ABNORMAL HIGH (ref 0–53)
AST: 64 U/L — ABNORMAL HIGH (ref 0–37)
Alkaline Phosphatase: 73 U/L (ref 39–117)
BUN: 13 mg/dL (ref 6–23)
CO2: 19 meq/L (ref 19–32)
Calcium: 9.3 mg/dL (ref 8.4–10.5)
Chloride: 103 mEq/L (ref 96–112)
Creatinine, Ser: 2.3 mg/dL — ABNORMAL HIGH (ref 0.4–1.5)
GFR: 41.08 mL/min — ABNORMAL LOW (ref >60.00)
GLUCOSE: 129 mg/dL — AB (ref 70–99)
Potassium: 3.7 mEq/L (ref 3.5–5.1)
Sodium: 136 mEq/L (ref 135–145)
Total Bilirubin: 1 mg/dL (ref 0.2–1.2)
Total Protein: 8.7 g/dL — ABNORMAL HIGH (ref 6.0–8.3)

## 2014-05-20 LAB — CBC WITH DIFFERENTIAL/PLATELET
Basophils Absolute: 0.1 10*3/uL (ref 0.0–0.1)
Basophils Relative: 0.5 % (ref 0.0–3.0)
EOS PCT: 1.6 % (ref 0.0–5.0)
Eosinophils Absolute: 0.2 10*3/uL (ref 0.0–0.7)
HCT: 56 % — ABNORMAL HIGH (ref 39.0–52.0)
Hemoglobin: 18.9 g/dL (ref 13.0–17.0)
LYMPHS PCT: 13.5 % (ref 12.0–46.0)
Lymphs Abs: 1.5 10*3/uL (ref 0.7–4.0)
MCHC: 33.7 g/dL (ref 30.0–36.0)
MCV: 90.1 fl (ref 78.0–100.0)
Monocytes Absolute: 0.9 10*3/uL (ref 0.1–1.0)
Monocytes Relative: 7.7 % (ref 3.0–12.0)
Neutro Abs: 8.6 10*3/uL — ABNORMAL HIGH (ref 1.4–7.7)
Neutrophils Relative %: 76.7 % (ref 43.0–77.0)
PLATELETS: 345 10*3/uL (ref 150.0–400.0)
RBC: 6.22 Mil/uL — ABNORMAL HIGH (ref 4.22–5.81)
RDW: 13.5 % (ref 11.5–15.5)
WBC: 11.2 10*3/uL — ABNORMAL HIGH (ref 4.0–10.5)

## 2014-05-20 MED ORDER — ONDANSETRON HCL 4 MG PO TABS: 4.0000 mg | ORAL_TABLET | Freq: Two times a day (BID) | ORAL | Status: DC

## 2014-05-20 MED ORDER — OMEPRAZOLE 20 MG PO CPDR: 20.0000 mg | DELAYED_RELEASE_CAPSULE | Freq: Every day | ORAL | Status: DC

## 2014-05-20 MED ORDER — OMEPRAZOLE 20 MG PO CPDR: 20.0000 mg | DELAYED_RELEASE_CAPSULE | Freq: Two times a day (BID) | ORAL | Status: DC
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
## 2014-05-20 NOTE — Patient Instructions (Addendum)
You have been given a separate informational sheet regarding your tobacco use, the importance of quitting and local resources to help you quit. You will be set up for a CT scan of abdomen and pelvis with IV and oral contrast for abd pain, vomiting, diarrhea. You will have labs checked today in the basement lab.  Please head down after you check out with the front desk  (cbc, cmet). You may need upper endoscopy, may need colonoscopy, pending the results of the above. Prescription for zofran 40m pills, take one pill twice daily. Please start OTC omeprazole, 219mpill  You have been scheduled for a CT [facility] of the abdomen and pelvis [facility]this is in the same building as [facility]   You are scheduled on 05/28/2014 at 9:00am. You should arrive 15 minutes prior to your appointment time for registration. Please follow the written instructions below on the day of your exam:  WARNING: IF YOU ARE ALLERGIC TO IODINE/X-RAY DYE, PLEASE NOTIFY RADIOLOGY IMMEDIATELY AT [phone] WILL BE GIVEN A 13 HOUR PREMEDICATION PREP.  1) Do not eat or drink anything after 5:00am (4 hours prior to your test) 2) You have been given 2 bottles of oral contrast to drink. The solution may taste better if refrigerated, but do NOT add ice or any other liquid to this solution. Shake well before drinking.    Drink 1 bottle of contrast @ 7:00am (2 hours prior to your exam)  Drink 1 bottle of contrast @ 8:00am (1 hour prior to your exam)  You may take any medications as prescribed with a small amount of water except for the following: Metformin, Glucophage, Glucovance, Avandamet, Riomet, Fortamet, Actoplus Met, Janumet, Glumetza or Metaglip. The above medications must be held the day of the exam AND 48 hours after the exam.  The purpose of you drinking the oral contrast is to aid in the visualization of your intestinal tract. The contrast solution may cause some diarrhea.  Before your exam is started, you will be given a small amount of fluid to drink. Depending on your individual set of symptoms, you may also receive an intravenous injection of x-ray contrast/dye. [facility] or long, depending on the type of exam you are having performed.  If you have any questions regarding your exam or if you need to reschedule, you may call the CT department at [phone] the hours of 8:00 am and 5:00 pm, Monday-Friday.  ________________________________________________________________________

## 2014-05-21 ENCOUNTER — Telehealth: Payer: Self-pay

## 2014-05-21 NOTE — Telephone Encounter (Signed)
Dr Ardis Hughs the pt can not afford zofran it is $172, can he get promethazine instead?

## 2014-05-22 ENCOUNTER — Encounter: Payer: Self-pay | Admitting: Internal Medicine

## 2014-05-22 ENCOUNTER — Ambulatory Visit (INDEPENDENT_AMBULATORY_CARE_PROVIDER_SITE_OTHER): Payer: No Typology Code available for payment source | Admitting: Internal Medicine

## 2014-05-22 VITALS — BP 125/83 | HR 101 | Temp 98.3°F | Wt 338.2 lb

## 2014-05-22 DIAGNOSIS — E785 Hyperlipidemia, unspecified: Secondary | ICD-10-CM

## 2014-05-22 DIAGNOSIS — I2699 Other pulmonary embolism without acute cor pulmonale: Secondary | ICD-10-CM

## 2014-05-22 DIAGNOSIS — R112 Nausea with vomiting, unspecified: Secondary | ICD-10-CM

## 2014-05-22 DIAGNOSIS — D6851 Activated protein C resistance: Secondary | ICD-10-CM

## 2014-05-22 DIAGNOSIS — D688 Other specified coagulation defects: Secondary | ICD-10-CM

## 2014-05-22 DIAGNOSIS — D751 Secondary polycythemia: Secondary | ICD-10-CM

## 2014-05-22 DIAGNOSIS — I10 Essential (primary) hypertension: Secondary | ICD-10-CM

## 2014-05-22 MED ORDER — PROMETHAZINE HCL 25 MG PO TABS: 25.0000 mg | ORAL_TABLET | Freq: Two times a day (BID) | ORAL | Status: DC

## 2014-05-22 NOTE — Progress Notes (Signed)
   Subjective:    Patient ID: Donald Brady, male    DOB: 17-Sep-1975, 38 y.o.   MRN: [id number]  HPI  Donald Brady is a 38 yo male with PMHx of HTN, HLD, bilateral PE on Xarelto, and OSA who presents to the clinic day for follow up.  Nausea and vomiting: patient states nausea, vomiting and diarrhea are much improved since last visit. He had these for over 1 month and was given GI referral since previous CT showed eccentric wall thickening of distal esophagus. He was seen by GI who prescribed phenergan 25 mg BID and scheduled him for CT abdomen pelvis on 05/28/14 and EGD in November. Patient is currently on Ranitidine 150 mg BID. He states he continues to have nausea, but diarrhea and vomiting have resolved.  Patient has had increasing hemoglobin over the last year. Most recent Hgb 18.9 with elevated RBC at 6.22. There is some concern for Polycythemia.   Please see problem based assessment and plan for further history.   Review of Systems General: Denies fever, chills, fatigue, change in appetite and diaphoresis.  Respiratory: Denies SOB, cough, DOE, chest tightness, and wheezing.   Cardiovascular: Denies chest pain and palpitations.  Gastrointestinal: Admits to nausea, Denies vomiting, abdominal pain, diarrhea, constipation, blood in stool and abdominal distention.  Genitourinary: Denies dysuria, urgency, frequency, hematuria, suprapubic pain and flank pain. Endocrine: Denies hot or cold intolerance, polyuria, and polydipsia. Musculoskeletal: Denies myalgias, back pain, joint swelling, arthralgias and gait problem.  Skin: Denies pallor, rash and wounds.  Neurological: Denies dizziness, headaches, weakness, lightheadedness, numbness,seizures, and syncope, Psychiatric/Behavioral: Denies mood changes, confusion, nervousness, sleep disturbance and agitation.    Objective:   Physical Exam Filed Vitals:   05/22/14 1511  BP: 125/83  Pulse: 101  Temp: 98.3 F (36.8 C)  TempSrc: Oral  Weight:  338 lb 3.2 oz (153.407 kg)  SpO2: 97%   General: Vital signs reviewed.  Patient is well-developed and well-nourished, in no acute distress and cooperative with exam.  Cardiovascular: RRR, S1 normal, S2 normal, no murmurs, gallops, or rubs. Pulmonary/Chest: Clear to auscultation bilaterally, no wheezes, rales, or rhonchi. Abdominal: Soft, obese, mildly tender to palpation in LLQ, non-distended, BS +, no masses, organomegaly, or guarding present.  Musculoskeletal: No joint deformities, erythema, or stiffness, ROM full and nontender. Extremities: No lower extremity edema bilaterally, no calf tenderness,  pulses symmetric and intact bilaterally. No cyanosis or clubbing. Skin: Warm, dry and intact. No rashes or erythema. Psychiatric: Normal mood and affect. speech and behavior is normal. Cognition and memory are normal.     Assessment & Plan:  Please problem based assessment and plan.

## 2014-05-22 NOTE — Telephone Encounter (Signed)
Med has been sent to the pharmacy 

## 2014-05-22 NOTE — Patient Instructions (Signed)
General Instructions:   Please bring your medicines with you each time you come to clinic.  Medicines may include prescription medications, over-the-counter medications, herbal remedies, eye drops, vitamins, or other pills.  CONTINUE TAKING YOUR MEDICATIONS AS PRESCRIBED.  START taking your Xarelto 15 mg twice a day. I will clarify when you should start taking your Xarelto 20 mg once a day.   Progress Toward Treatment Goals:  Treatment Goal 10/18/2013  Blood pressure improved    Self Care Goals & Plans:  Self Care Goal 05/15/2014  Manage my medications take my medicines as prescribed; bring my medications to every visit; refill my medications on time; follow the sick day instructions if I am sick  Monitor my health keep track of my blood pressure; keep track of my weight  Eat healthy foods eat more vegetables; eat fruit for snacks and desserts; eat foods that are low in salt; eat baked foods instead of fried foods; eat smaller portions  Be physically active find an activity I enjoy  Stop smoking -  Prevent falls -    Pulmonary Embolism A pulmonary (lung) embolism (PE) is a blood clot that has traveled to the lung and results in a blockage of blood flow in the affected lung. Most clots come from deep veins in the legs or pelvis. PE is a dangerous and potentially life-threatening condition that can be treated if identified. CAUSES Blood clots form in a vein for different reasons. Usually several things cause blood clots. They include:  The flow of blood slows down.  The inside of the vein is damaged in some way.  The person has a condition that makes the blood clot more easily. RISK FACTORS Some people are more likely than others to develop PE. Risk factors include:   Smoking.  Being overweight (obese).  Sitting or lying still for a long time. This includes long-distance travel, paralysis, or recovery from an illness or surgery. Other factors that increase risk are:   Older  age, especially over 28 years of age.  Having a family history of blood clots or if you have already had a blood clot.  Having major or lengthy surgery. This is especially true for surgery on the hip, knee, or belly (abdomen). Hip surgery is particularly high risk.  Having a long, thin tube (catheter) placed inside a vein during a medical procedure.  Breaking a hip or leg.  Having cancer or cancer treatment.  Medicines containing the male hormone estrogen. This includes birth control pills and hormone replacement therapy.  Other circulation or heart problems.  Pregnancy and childbirth.  Hormone changes make the blood clot more easily during pregnancy.  The fetus puts pressure on the veins of the pelvis.  There is a risk of injury to veins during delivery or a caesarean delivery. The risk is highest just after childbirth.  PREVENTION   Exercise the legs regularly. Take a brisk 30 minute walk every day.  Maintain a weight that is appropriate for your height.  Avoid sitting or lying in bed for long periods of time without moving your legs.  Women, particularly those over the age of 26 years, should consider the risks and benefits of taking estrogen medicines, including birth control pills.  Do not smoke, especially if you take estrogen medicines.  Long-distance travel can increase your risk. You should exercise your legs by walking or pumping the muscles every hour.  Many of the risk factors above relate to situations that exist with hospitalization, either for illness,  injury, or elective surgery. Prevention may include medical and nonmedical measures.   Your health care provider will assess you for the need for venous thromboembolism prevention when you are admitted to the hospital. If you are having surgery, your surgeon will assess you the day of or day after surgery.  SYMPTOMS  The symptoms of a PE usually start suddenly and include:  Shortness of  breath.  Coughing.  Coughing up blood or blood-tinged mucus.  Chest pain. Pain is often worse with deep breaths.  Rapid heartbeat. DIAGNOSIS  If a PE is suspected, your health care provider will take a medical history and perform a physical exam. Other tests that may be required include:  Blood tests, such as studies of the clotting properties of your blood.  Imaging tests, such as ultrasound, CT, MRI, and other tests to see if you have clots in your legs or lungs.  An electrocardiogram. This can look for heart strain from blood clots in the lungs. TREATMENT   The most common treatment for a PE is blood thinning (anticoagulant) medicine, which reduces the blood's tendency to clot. Anticoagulants can stop new blood clots from forming and old clots from growing. They cannot dissolve existing clots. Your body does this by itself over time. Anticoagulants can be given by mouth, through an intravenous (IV) tube, or by injection. Your health care provider will determine the best program for you.  Less commonly, clot-dissolving medicines (thrombolytics) are used to dissolve a PE. They carry a high risk of bleeding, so they are used mainly in severe cases.  Very rarely, a blood clot in the leg needs to be removed surgically.  If you are unable to take anticoagulants, your health care provider may arrange for you to have a filter placed in a main vein in your abdomen. This filter prevents clots from traveling to your lungs. HOME CARE INSTRUCTIONS   Take all medicines as directed by your health care provider.  Learn as much as you can about DVT.  Wear a medical alert bracelet or carry a medical alert card.  Ask your health care provider how soon you can go back to normal activities. It is important to stay active to prevent blood clots. If you are on anticoagulant medicine, avoid contact sports.  It is very important to exercise. This is especially important while traveling, sitting, or  standing for long periods of time. Exercise your legs by walking or by tightening and relaxing your leg muscles regularly. Take frequent walks.  You may need to wear compression stockings. These are tight elastic stockings that apply pressure to the lower legs. This pressure can help keep the blood in the legs from clotting. Taking Warfarin Warfarin is a daily medicine that is taken by mouth. Your health care provider will advise you on the length of treatment (usually 3-6 months, sometimes lifelong). If you take warfarin:  Understand how to take warfarin and foods that can affect how warfarin works in Veterinary surgeon.  Too much and too little warfarin are both dangerous. Too much warfarin increases the risk of bleeding. Too little warfarin continues to allow the risk for blood clots. Warfarin and Regular Blood Testing While taking warfarin, you will need to have regular blood tests to measure your blood clotting time. These blood tests usually include both the prothrombin time (PT) and international normalized ratio (INR) tests. The PT and INR results allow your health care provider to adjust your dose of warfarin. It is very important that you  have your PT and INR tested as often as directed by your health care provider.  Warfarin and Your Diet Avoid major changes in your diet, or notify your health care provider before changing your diet. Arrange a visit with a registered dietitian to answer your questions. Many foods, especially foods high in vitamin K, can interfere with warfarin and affect the PT and INR results. You should eat a consistent amount of foods high in vitamin K. Foods high in vitamin K include:   Spinach, kale, broccoli, cabbage, collard and turnip greens, Brussels sprouts, peas, cauliflower, seaweed, and parsley.  Beef and pork liver.  Green tea.  Soybean oil. Warfarin with Other Medicines Many medicines can interfere with warfarin and affect the PT and INR results. You  must:  Tell your health care provider about any and all medicines, vitamins, and supplements you take, including aspirin and other over-the-counter anti-inflammatory medicines. Be especially cautious with aspirin and anti-inflammatory medicines. Ask your health care provider before taking these.  Do not take or discontinue any prescribed or over-the-counter medicine except on the advice of your health care provider or pharmacist. Warfarin Side Effects Warfarin can have side effects, such as easy bruising and difficulty stopping bleeding. Ask your health care provider or pharmacist about other side effects of warfarin. You will need to:  Hold pressure over cuts for longer than usual.  Notify your dentist and other health care providers that you are taking warfarin before you undergo any procedures where bleeding may occur. Warfarin with Alcohol and Tobacco   Drinking alcohol frequently can increase the effect of warfarin, leading to excess bleeding. It is best to avoid alcoholic drinks or consume only very small amounts while taking warfarin. Notify your health care provider if you change your alcohol intake.  Do not use any tobacco products including cigarettes, chewing tobacco, or electronic cigarettes. If you smoke, quit. Ask your health care provider for help with quitting smoking. Alternative Medicines to Warfarin: Factor Xa Inhibitor Medicines  These blood thinning medicines are taken by mouth, usually for several weeks or longer. It is important to take the medicine every single day, at the same time each day.  There are no regular blood tests required when using these medicines.  There are fewer food and drug interactions than with warfarin.  The side effects of this class of medicine is similar to that of warfarin, including excessive bruising or bleeding. Ask your health care provider or pharmacist about other potential side effects. SEEK MEDICAL CARE IF:   You notice a rapid  heartbeat.  You feel weaker or more tired than usual.  You feel faint.  You notice increased bruising.  Your symptoms are not getting better in the time expected.  You are having side effects of medicine. SEEK IMMEDIATE MEDICAL CARE IF:   You have chest pain.  You have trouble breathing.  You have new or increased swelling or pain in one leg.  You cough up blood.  You notice blood in vomit, in a bowel movement, or in urine.  You have a fever. Symptoms of PE may represent a serious problem that is an emergency. Do not wait to see if the symptoms will go away. Get medical help right away. Call your local emergency services (911 in the [place]). Do not drive yourself to the hospital. Document Released: 07/30/2000 Document Revised: 12/17/2013 Document Reviewed: 08/13/2013 [facility] Patient Information 2015 [place], [place]. This information is not intended to replace advice given to you by your  health care provider. Make sure you discuss any questions you have with your health care provider.

## 2014-05-22 NOTE — Telephone Encounter (Signed)
Yes, phenergan 25mg  pills, take one pill twice daily as needed for nausea.  Disp 60 with 3 refills.

## 2014-05-23 ENCOUNTER — Telehealth: Payer: Self-pay | Admitting: *Deleted

## 2014-05-23 ENCOUNTER — Telehealth: Payer: Self-pay | Admitting: Gastroenterology

## 2014-05-23 DIAGNOSIS — D751 Secondary polycythemia: Secondary | ICD-10-CM | POA: Insufficient documentation

## 2014-05-23 NOTE — Assessment & Plan Note (Signed)
Assessment: Patient was started on Xarelto 15 mg BID with transition to Xarelto 20 mg daily. Patient's hypercoagulability work up came back positive for heterozygous Factor V Leiden deficiency giving him a 3-8x increased risk of clots.  Plan: Continue Xarelto 15 mg BID with transition to Xarelto 20 mg daily.

## 2014-05-23 NOTE — Assessment & Plan Note (Addendum)
Assessment: Hemoglobin 18.9, RBC elevated at 6.22. Pulse ox 97% on room air.  Plan: Check Epo level. If normal, consider causes such as obstructive sleep apnea.  Addendum: Epo level normal at 4.5.

## 2014-05-23 NOTE — Telephone Encounter (Signed)
Spoke with pts mother, pt does not need the zofran script because phenergan was sent in for him due to cost. Prilosec script and Phenergan scripts called to [place] and both will be 18.55. States she can afford these.

## 2014-05-23 NOTE — Telephone Encounter (Signed)
Mother of pt called [facility] in [place] told mother had 3 Rx - over $200.00. Pt has no insurance. Be glad to pay $4.00 for each med. Called pharmacy and stated 3 Rx from Dr Ardis Hughs - [facility] -  Prilosec, Zofran and Phenergan - all generic form. Talked again with mother and suggest for her to call Dr Ardis Hughs office and talk with his nurse. Hilda Blades Maayan Jenning RN 05/23/14 11:15AM

## 2014-05-23 NOTE — Assessment & Plan Note (Signed)
BP Readings from Last 3 Encounters:  05/22/14 125/83  05/20/14 138/96  05/15/14 153/90    Lab Results  Component Value Date   NA 136 05/20/2014   K 3.7 05/20/2014   CREATININE 2.3* 05/20/2014    Assessment: Blood pressure control:  At goal Progress toward BP goal:   Improved Comments: Increased from lisinopril-hctz 10-12.5 to 20-25 mg daily last visit.  Plan: Medications:  continue current medications of Lisinopril-HCTZ 20-25 mg daily. Educational resources provided:   Self management tools provided:   Other plans: None

## 2014-05-24 LAB — ERYTHROPOIETIN: Erythropoietin: 4.5 m[IU]/mL (ref 2.6–18.5)

## 2014-05-28 ENCOUNTER — Ambulatory Visit (INDEPENDENT_AMBULATORY_CARE_PROVIDER_SITE_OTHER)
Admission: RE | Admit: 2014-05-28 | Discharge: 2014-05-28 | Disposition: A | Discharge status: Home / Self Care | Payer: No Typology Code available for payment source | Source: Ambulatory Visit | Attending: Gastroenterology | Admitting: Gastroenterology

## 2014-05-28 DIAGNOSIS — R1013 Epigastric pain: Secondary | ICD-10-CM

## 2014-05-28 DIAGNOSIS — R112 Nausea with vomiting, unspecified: Secondary | ICD-10-CM

## 2014-05-28 DIAGNOSIS — R197 Diarrhea, unspecified: Secondary | ICD-10-CM

## 2014-05-29 NOTE — Progress Notes (Signed)
I saw and evaluated the patient.  I personally confirmed the key portions of Dr. Nena Alexander history and exam and reviewed pertinent patient test results.  The assessment, diagnosis, and plan were formulated together and I agree with the documentation in the resident's note.

## 2014-06-10 ENCOUNTER — Telehealth: Payer: Self-pay | Admitting: *Deleted

## 2014-06-10 ENCOUNTER — Other Ambulatory Visit: Payer: Self-pay | Admitting: Internal Medicine

## 2014-06-10 ENCOUNTER — Encounter: Payer: No Typology Code available for payment source | Admitting: Internal Medicine

## 2014-06-10 ENCOUNTER — Encounter: Payer: Self-pay | Admitting: Internal Medicine

## 2014-06-10 ENCOUNTER — Ambulatory Visit: Payer: No Typology Code available for payment source

## 2014-06-10 NOTE — Telephone Encounter (Signed)
Pt calls and states Saturday 10/24 started at appr 1300, chest pain at intervals, pain is center of lower chest the pain mioves up to lower portion of "throat", feels like he is choking, "like a hiccup that is stuck". Denies h/a, shortness of breath, dizziness, weakness, then just after this pain he starts to have pain in the R lower back, sharp, "like pins sticking in me".  He also states he has not taken his xarelto in appr 7 months due to financial reasons, states he is not aware of the [facility], he has the orange card. He has stated he has no transportation but i have stressed the importance of seeing him today, he finally agrees to call around and try to find a ride and will call me back, will schedule with dr groce also. i have placed him in empty slot for dr gill at 1415 and dr groce at 1400, will await pt's call

## 2014-06-10 NOTE — Telephone Encounter (Signed)
I agree Donald Brady needs to be seen for the chest pain given his history.  He also needs to be educated on compliance with medications.  If he can not find transportation to his clinic appointment this afternoon for assessment he has been told by triage to call 911 to be taken to the nearest facility for evaluation of this pain.

## 2014-06-12 ENCOUNTER — Telehealth: Payer: Self-pay | Admitting: *Deleted

## 2014-06-12 DIAGNOSIS — M1712 Unilateral primary osteoarthritis, left knee: Secondary | ICD-10-CM

## 2014-06-12 NOTE — Telephone Encounter (Signed)
Pt called - needs refill on hydrocodone-acetaminophen 10-325mg  #180 - last Rx 05/08/14 from [facility]. Hilda Blades Krist Rosenboom RN 06/12/14 11:30AM

## 2014-06-13 MED ORDER — HYDROCODONE-ACETAMINOPHEN 5-325 MG PO TABS: 1.0000 | ORAL_TABLET | Freq: Four times a day (QID) | ORAL | Status: DC | PRN

## 2014-06-13 NOTE — Telephone Encounter (Signed)
Patient called for refill on hydrocodone-APAP 10-325 #180, last filled 05/08/14. Patient does not have this medication on his med list, but after the reviewing the chart, he has been receiving it monthly. He has a pain contract which was signed in 07/2013. Patient should only receive Vicodin 5-325 mg 1-2 tabs q6h prn, # 180/month for treatment of his chronic pleuritic chest pain (pain clinic will not see patient) and his left knee OA. Patient does follow with Sports Medicine who gives him steroid injections.  I will refill Norco 5-325 1-2 tabs Q6H prn #180. However, patient should have a UDS screen at his next visit and attempts should be made to reduce this dose as patient seems to be requiring all 180 pills each month. I suspect that with continued weight loss, patient's OA would become less painful. Other strategies may need to be looked into.

## 2014-07-10 ENCOUNTER — Other Ambulatory Visit: Payer: Self-pay | Admitting: *Deleted

## 2014-07-10 ENCOUNTER — Encounter: Payer: Self-pay | Admitting: Gastroenterology

## 2014-07-10 ENCOUNTER — Ambulatory Visit (AMBULATORY_SURGERY_CENTER): Payer: No Typology Code available for payment source | Admitting: Gastroenterology

## 2014-07-10 VITALS — BP 116/89 | HR 89 | Temp 98.2°F | Resp 20 | Ht 73.0 in | Wt 340.0 lb

## 2014-07-10 DIAGNOSIS — R112 Nausea with vomiting, unspecified: Secondary | ICD-10-CM

## 2014-07-10 DIAGNOSIS — M1712 Unilateral primary osteoarthritis, left knee: Secondary | ICD-10-CM

## 2014-07-10 HISTORY — PX: ESOPHAGOGASTRODUODENOSCOPY (EGD) WITH PROPOFOL: SHX5813

## 2014-07-10 MED ORDER — SODIUM CHLORIDE 0.9 % IV SOLN: 500.0000 mL | INTRAVENOUS | Status: DC

## 2014-07-10 NOTE — Patient Instructions (Signed)
YOU HAD AN ENDOSCOPIC PROCEDURE TODAY AT THE [facility]: Refer to the procedure report that was given to you for any specific questions about what was found during the examination.  If the procedure report does not answer your questions, please call your gastroenterologist to clarify.  If you requested that your care partner not be given the details of your procedure findings, then the procedure report has been included in a sealed envelope for you to review at your convenience later.  YOU SHOULD EXPECT: Some feelings of bloating in the abdomen. Passage of more gas than usual.  Walking can help get rid of the air that was put into your GI tract during the procedure and reduce the bloating. If you had a lower endoscopy (such as a colonoscopy or flexible sigmoidoscopy) you may notice spotting of blood in your stool or on the toilet paper. If you underwent a bowel prep for your procedure, then you may not have a normal bowel movement for a few days.  DIET: Your first meal following the procedure should be a light meal and then it is ok to progress to your normal diet.  A half-sandwich or bowl of soup is an example of a good first meal.  Heavy or fried foods are harder to digest and may make you feel nauseous or bloated.  Likewise meals heavy in dairy and vegetables can cause extra gas to form and this can also increase the bloating.  Drink plenty of fluids but you should avoid alcoholic beverages for 24 hours.  ACTIVITY: Your care partner should take you home directly after the procedure.  You should plan to take it easy, moving slowly for the rest of the day.  You can resume normal activity the day after the procedure however you should NOT DRIVE or use heavy machinery for 24 hours (because of the sedation medicines used during the test).    SYMPTOMS TO REPORT IMMEDIATELY: A gastroenterologist can be reached at any hour.  During normal business hours, 8:30 AM to 5:00 PM Monday through Friday,  call [phone].  After hours and on weekends, please call the GI answering service at [phone] who will take a message and have the physician on call contact you.   Following upper endoscopy (EGD)  Vomiting of blood or coffee ground material  New chest pain or pain under the shoulder blades  Painful or persistently difficult swallowing  New shortness of breath  Fever of 100F or higher  Black, tarry-looking stools  FOLLOW UP: If any biopsies were taken you will be contacted by phone or by letter within the next 1-3 weeks.  Call your gastroenterologist if you have not heard about the biopsies in 3 weeks.  Our staff will call the home number listed on your records the next business day following your procedure to check on you and address any questions or concerns that you may have at that time regarding the information given to you following your procedure. This is a courtesy call and so if there is no answer at the home number and we have not heard from you through the emergency physician on call, we will assume that you have returned to your regular daily activities without incident.  SIGNATURES/CONFIDENTIALITY: You and/or your care partner have signed paperwork which will be entered into your electronic medical record.  These signatures attest to the fact that that the information above on your After Visit Summary has been reviewed and is understood.  Full responsibility   of the confidentiality of this discharge information lies with you and/or your care-partner.  Resume medications. 

## 2014-07-10 NOTE — Op Note (Signed)
[facility]  [address] [place], [address]   ENDOSCOPY PROCEDURE REPORT  PATIENT: Donald Brady, Donald Brady  MR#: [id number] BIRTHDATE: [birth year] , 38  yrs. old GENDER: male ENDOSCOPIST: Milus Banister, MD PROCEDURE DATE:  07/10/2014 PROCEDURE:  EGD, diagnostic ASA CLASS:     Class III INDICATIONS:  nausea, vomiting. MEDICATIONS: Monitored anesthesia care and Propofol 260 mg IV TOPICAL ANESTHETIC: none  DESCRIPTION OF PROCEDURE: After the risks benefits and alternatives of the procedure were thoroughly explained, informed consent was obtained.  The LB [id number] endoscope was introduced through the mouth and advanced to the second portion of the duodenum , Without limitations.  The instrument was slowly withdrawn as the mucosa was fully examined.   EXAM: The esophagus and gastroesophageal junction were completely normal in appearance.  The stomach was entered and closely examined.The antrum, angularis, and lesser curvature were well visualized, including a retroflexed view of the cardia and fundus. The stomach wall was normally distensable.  The scope passed easily through the pylorus into the duodenum.  Retroflexed views revealed no abnormalities.     The scope was then withdrawn from the patient and the procedure completed. COMPLICATIONS: There were no immediate complications.  ENDOSCOPIC IMPRESSION: Normal appearing esophagus and GE junction, the stomach was well visualized and normal in appearance, normal appearing duodenum  RECOMMENDATIONS: Continue taking omeprazole twice daily.  It seems like this has been helping your nausea, vomiting.   eSigned:  Milus Banister, MD 07/10/2014 11:04 AM

## 2014-07-10 NOTE — Progress Notes (Signed)
Report to PACU, RN, vss, BBS= Clear.  

## 2014-07-15 ENCOUNTER — Telehealth: Payer: Self-pay | Admitting: *Deleted

## 2014-07-15 MED ORDER — HYDROCODONE-ACETAMINOPHEN 5-325 MG PO TABS: 1.0000 | ORAL_TABLET | Freq: Four times a day (QID) | ORAL | Status: DC | PRN

## 2014-07-15 NOTE — Telephone Encounter (Signed)
Pt informed Rx is ready 

## 2014-07-15 NOTE — Telephone Encounter (Signed)
  Follow up Call-  Call back number 07/10/2014  Post procedure Call Back phone  # [phone]  Permission to leave phone message Yes     Patient questions:  Do you have a fever, pain , or abdominal swelling? No. Pain Score  0 *  Have you tolerated food without any problems? Yes.    Have you been able to return to your normal activities? Yes.    Do you have any questions about your discharge instructions: Diet   No. Medications  No. Follow up visit  No.  Do you have questions or concerns about your Care? No.  Actions: * If pain score is 4 or above: No action needed, pain <4.

## 2014-07-24 ENCOUNTER — Encounter: Payer: Self-pay | Admitting: Internal Medicine

## 2014-07-24 ENCOUNTER — Ambulatory Visit (INDEPENDENT_AMBULATORY_CARE_PROVIDER_SITE_OTHER): Payer: No Typology Code available for payment source | Admitting: Internal Medicine

## 2014-07-24 VITALS — BP 143/97 | HR 83 | Temp 98.3°F | Ht 74.0 in | Wt 350.9 lb

## 2014-07-24 DIAGNOSIS — M1712 Unilateral primary osteoarthritis, left knee: Secondary | ICD-10-CM

## 2014-07-24 DIAGNOSIS — G4733 Obstructive sleep apnea (adult) (pediatric): Secondary | ICD-10-CM

## 2014-07-24 DIAGNOSIS — K219 Gastro-esophageal reflux disease without esophagitis: Secondary | ICD-10-CM

## 2014-07-24 DIAGNOSIS — I10 Essential (primary) hypertension: Secondary | ICD-10-CM

## 2014-07-24 DIAGNOSIS — Z7901 Long term (current) use of anticoagulants: Secondary | ICD-10-CM

## 2014-07-24 DIAGNOSIS — I2699 Other pulmonary embolism without acute cor pulmonale: Secondary | ICD-10-CM

## 2014-07-24 DIAGNOSIS — D751 Secondary polycythemia: Secondary | ICD-10-CM

## 2014-07-24 MED ORDER — RIVAROXABAN 20 MG PO TABS: 20.0000 mg | ORAL_TABLET | Freq: Every day | ORAL | Status: DC

## 2014-07-24 MED ORDER — RIVAROXABAN 15 MG PO TABS: 15.0000 mg | ORAL_TABLET | Freq: Two times a day (BID) | ORAL | Status: DC

## 2014-07-24 NOTE — Assessment & Plan Note (Signed)
Assessment: Hemoglobin and RBC elevated at previous visit with normal pulse ox. 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 Patient previously diagnosed with OSA but never started on CPAP per patient's preference. He does not believe he still has OSA as he sleeps well at night without interruption in sleep.  Plan: -Recheck CBC at next visit -If continuously elevated, repeat sleep study with recommendation to start CPAP

## 2014-07-24 NOTE — Assessment & Plan Note (Signed)
Assessment: Patient previously had recurrent nausea and vomiting that did not respond to ranitidine. Patient had a normal CT abdomen/pelvis and was referred to GI for endoscopy. EGD was normal and N/V was thought secondary to GERD. Patient was put on omeprazole 20 mg BID which has completely resolved his symptoms.   Plan: -Continue omeprazole 20 mg BID -Counseled on the avoidance of marijuana, tobacco and alcohol

## 2014-07-24 NOTE — Assessment & Plan Note (Signed)
Assessment: Patient is on Norco 5-325 mg 1-2 tabs Q6H prn for OA. Patient states he only takes medications at night because he does not want to take any before hunting in the woods. Pain well-controlled.  Plan: -Continue Norco 5-325 mg Q6H prn -UDS

## 2014-07-24 NOTE — Assessment & Plan Note (Signed)
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 Patient is not wearing a CPAP at night and does not believe he has OSA anymore.   Plan: -Recheck CBC at next visit -If hemoglobin/RBC increasingly elevated, schedule repeat sleep study for CPAP

## 2014-07-24 NOTE — Patient Instructions (Signed)
General Instructions:    Please bring your medicines with you each time you come to clinic.  Medicines may include prescription medications, over-the-counter medications, herbal remedies, eye drops, vitamins, or other pills.  START XARELTO 15 MG (1 PILL) twice a day with meals for 21 days (start 07/25/14, stop 08/15/14) START XARELTO 20 MG (1 PILL) once a day with meals (start 08/16/14)  Rivaroxaban oral tablets What is this medicine? RIVAROXABAN (ri va ROX a ban) is an anticoagulant (blood thinner). It is used to treat blood clots in the lungs or in the veins. It is also used after knee or hip surgeries to prevent blood clots. It is also used to lower the chance of stroke in people with a medical condition called atrial fibrillation. This medicine may be used for other purposes; ask your health care provider or pharmacist if you have questions. COMMON BRAND NAME(S): Xarelto, Xarelto Starter Pack What should I tell my health care provider before I take this medicine? They need to know if you have any of these conditions: -bleeding disorders -bleeding in the brain -blood in your stools (black or tarry stools) or if you have blood in your vomit -history of stomach bleeding -kidney disease -liver disease -low blood counts, like low white cell, platelet, or red cell counts -recent or planned spinal or epidural procedure -take medicines that treat or prevent blood clots -an unusual or allergic reaction to rivaroxaban, other medicines, foods, dyes, or preservatives -pregnant or trying to get pregnant -breast-feeding How should I use this medicine? Take this medicine by mouth with a glass of water. Follow the directions on the prescription label. Take your medicine at regular intervals. Do not take it more often than directed. Do not stop taking except on your doctor's advice. Stopping this medicine may increase your risk of a blot clot. Be sure to refill your prescription before you run out of  medicine. If you are taking this medicine after hip or knee replacement surgery, take it with or without food. If you are taking this medicine for atrial fibrillation, take it with your evening meal. If you are taking this medicine to treat blood clots, take it with food at the same time each day. If you are unable to swallow your tablet, you may crush the tablet and mix it in applesauce. Then, immediately eat the applesauce. You should eat more food right after you eat the applesauce containing the crushed tablet. Talk to your pediatrician regarding the use of this medicine in children. Special care may be needed. Overdosage: If you think you have taken too much of this medicine contact a poison control center or emergency room at once. NOTE: This medicine is only for you. Do not share this medicine with others. What if I miss a dose? If you take your medicine once a day and miss a dose, take the missed dose as soon as you remember. If you take your medicine twice a day and miss a dose, take the missed dose immediately. In this instance, 2 tablets may be taken at the same time. The next day you should take 1 tablet twice a day as directed. What may interact with this medicine? -aspirin and aspirin-like medicines -certain antibiotics like erythromycin, azithromycin, and clarithromycin -certain medicines for fungal infections like ketoconazole and itraconazole -certain medicines for irregular heart beat like amiodarone, quinidine, dronedarone -certain medicines for seizures like carbamazepine, phenytoin -certain medicines that treat or prevent blood clots like warfarin, enoxaparin, and dalteparin -conivaptan -diltiazem -felodipine -indinavir -  lopinavir; ritonavir -NSAIDS, medicines for pain and inflammation, like ibuprofen or naproxen -ranolazine -rifampin -ritonavir -St. John's wort -verapamil This list may not describe all possible interactions. Give your health care provider a list of all  the medicines, herbs, non-prescription drugs, or dietary supplements you use. Also tell them if you smoke, drink alcohol, or use illegal drugs. Some items may interact with your medicine. What should I watch for while using this medicine? Visit your doctor or health care professional for regular checks on your progress. Your condition will be monitored carefully while you are receiving this medicine. Notify your doctor or health care professional and seek emergency treatment if you develop breathing problems; changes in vision; chest pain; severe, sudden headache; pain, swelling, warmth in the leg; trouble speaking; sudden numbness or weakness of the face, arm, or leg. These can be signs that your condition has gotten worse. If you are going to have surgery, tell your doctor or health care professional that you are taking this medicine. Tell your health care professional that you use this medicine before you have a spinal or epidural procedure. Sometimes people who take this medicine have bleeding problems around the spine when they have a spinal or epidural procedure. This bleeding is very rare. If you have a spinal or epidural procedure while on this medicine, call your health care professional immediately if you have back pain, numbness or tingling (especially in your legs and feet), muscle weakness, paralysis, or loss of bladder or bowel control. Avoid sports and activities that might cause injury while you are using this medicine. Severe falls or injuries can cause unseen bleeding. Be careful when using sharp tools or knives. Consider using an Copy. Take special care brushing or flossing your teeth. Report any injuries, bruising, or red spots on the skin to your doctor or health care professional. What side effects may I notice from receiving this medicine? Side effects that you should report to your doctor or health care professional as soon as possible: -allergic reactions like skin rash,  itching or hives, swelling of the face, lips, or tongue -back pain -redness, blistering, peeling or loosening of the skin, including inside the mouth -signs and symptoms of bleeding such as bloody or black, tarry stools; red or dark-brown urine; spitting up blood or brown material that looks like coffee grounds; red spots on the skin; unusual bruising or bleeding from the eye, gums, or nose Side effects that usually do not require medical attention (Report these to your doctor or health care professional if they continue or are bothersome.): -dizziness -muscle pain This list may not describe all possible side effects. Call your doctor for medical advice about side effects. You may report side effects to FDA at 1-800-FDA-1088. Where should I keep my medicine? Keep out of the reach of children. Store at room temperature between 15 and 30 degrees C (59 and 86 degrees F). Throw away any unused medicine after the expiration date. NOTE: This sheet is a summary. It may not cover all possible information. If you have questions about this medicine, talk to your doctor, pharmacist, or health care provider.  2015, [employer]. (2013-11-22 18:47:48)

## 2014-07-24 NOTE — Assessment & Plan Note (Signed)
Assessment: Patient admits that he still has not picked up his Xarelto. He is willing and motivated to start the Xarelto. I counseled him on the importance of taking Xarelto with meals and not missing doses. I also counseled him on sided effects and provided drug information. Patient understands and agrees. He denies any calf tenderness, lower extremity edema, shortness of breath or chest pain.  Plan: -Xarelto 15 mg BID for 21 days -Xarelto 20 mg daily for life

## 2014-07-24 NOTE — Assessment & Plan Note (Signed)
BP Readings from Last 3 Encounters:  07/24/14 143/97  07/10/14 116/89  05/22/14 125/83    Lab Results  Component Value Date   NA 136 05/20/2014   K 3.7 05/20/2014   CREATININE 2.3* 05/20/2014    Assessment: Blood pressure control:  Mildly elevated Progress toward BP goal:   Above goal Comments: normally very well controlled, does admit to smoking a cigarette before his appointment and stress because a friend is in the ICU  Plan: Medications:  continue current medications Educational resources provided:   Self management tools provided:   Other plans: Counseled on smoking cessation
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
## 2014-07-25 ENCOUNTER — Other Ambulatory Visit: Payer: No Typology Code available for payment source

## 2014-07-25 DIAGNOSIS — M1712 Unilateral primary osteoarthritis, left knee: Secondary | ICD-10-CM

## 2014-07-25 NOTE — Progress Notes (Signed)
Internal Medicine Clinic Attending  I saw and evaluated the patient.  I personally confirmed the key portions of the history and exam documented by Dr. Richardson and I reviewed pertinent patient test results.  The assessment, diagnosis, and plan were formulated together and I agree with the documentation in the resident's note. 

## 2014-07-26 LAB — PRESCRIPTION ABUSE MONITORING 15P, URINE
AMPHETAMINE/METH: NEGATIVE ng/mL
BARBITURATE SCREEN, URINE: NEGATIVE ng/mL
Benzodiazepine Screen, Urine: NEGATIVE ng/mL
Buprenorphine, Urine: NEGATIVE ng/mL
CARISOPRODOL, URINE: NEGATIVE ng/mL
CREATININE, URINE: 135.01 mg/dL (ref >20.0)
FENTANYL URINE: NEGATIVE ng/mL
MEPERIDINE UR: NEGATIVE ng/mL
Methadone Screen, Urine: NEGATIVE ng/mL
OXYCODONE SCRN UR: NEGATIVE ng/mL
Propoxyphene: NEGATIVE ng/mL
Tramadol Scrn, Ur: NEGATIVE ng/mL
Zolpidem, Urine: NEGATIVE ng/mL

## 2014-08-01 ENCOUNTER — Other Ambulatory Visit: Payer: Self-pay | Admitting: Internal Medicine

## 2014-08-01 ENCOUNTER — Telehealth: Payer: Self-pay | Admitting: *Deleted

## 2014-08-01 DIAGNOSIS — F411 Generalized anxiety disorder: Secondary | ICD-10-CM

## 2014-08-01 LAB — COCAINE METABOLITE (GC/LC/MS), URINE: BENZOYLECGONINE GC/MS CONF: 134 ng/mL — AB (ref <100)

## 2014-08-01 LAB — OPIATES/OPIOIDS (LC/MS-MS)
Codeine Urine: NEGATIVE ng/mL (ref <50)
HYDROMORPHONE: 113 ng/mL — AB (ref <50)
Hydrocodone: 373 ng/mL — ABNORMAL HIGH (ref <50)
Morphine Urine: NEGATIVE ng/mL (ref <50)
NOROXYCODONE, UR: NEGATIVE ng/mL (ref <50)
Norhydrocodone, Ur: 302 ng/mL — ABNORMAL HIGH (ref <50)
OXYCODONE, UR: NEGATIVE ng/mL (ref <50)
OXYMORPHONE, URINE: NEGATIVE ng/mL (ref <50)

## 2014-08-01 LAB — CANNABANOIDS (GC/LC/MS), URINE: THC-COOH (GC/LC/MS), ur confirm: 531 ng/mL — ABNORMAL HIGH (ref <5)

## 2014-08-01 NOTE — Telephone Encounter (Signed)
Received a call from pt stating that his Rx for Xarelto will cost $565 and he can not afford that.  He has no insurance. Dr. Maudie Mercury was able to get a free 30 day trial for the patient. He has been informed and also advised to call [facility] and sign up with them so after the 30 days he can get Xarelto from them. No cost. He voices understanding.

## 2014-08-26 ENCOUNTER — Other Ambulatory Visit: Payer: Self-pay | Admitting: *Deleted

## 2014-08-26 DIAGNOSIS — M1712 Unilateral primary osteoarthritis, left knee: Secondary | ICD-10-CM

## 2014-08-27 MED ORDER — HYDROCODONE-ACETAMINOPHEN 5-325 MG PO TABS: 1.0000 | ORAL_TABLET | Freq: Four times a day (QID) | ORAL | Status: DC | PRN

## 2014-08-29 ENCOUNTER — Ambulatory Visit: Payer: No Typology Code available for payment source

## 2014-09-04 ENCOUNTER — Encounter: Payer: Self-pay | Admitting: Sports Medicine

## 2014-09-04 ENCOUNTER — Ambulatory Visit (INDEPENDENT_AMBULATORY_CARE_PROVIDER_SITE_OTHER): Payer: No Typology Code available for payment source | Admitting: Sports Medicine

## 2014-09-04 VITALS — BP 157/109 | Ht 75.0 in | Wt 357.0 lb

## 2014-09-04 DIAGNOSIS — M1712 Unilateral primary osteoarthritis, left knee: Secondary | ICD-10-CM

## 2014-09-04 MED ORDER — METHYLPREDNISOLONE ACETATE 40 MG/ML IJ SUSP
40.0000 mg | Freq: Once | INTRAMUSCULAR | Status: AC
  Administered 2014-09-04: 40 mg via INTRA_ARTICULAR
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
## 2014-09-05 ENCOUNTER — Ambulatory Visit: Payer: No Typology Code available for payment source

## 2014-10-01 ENCOUNTER — Other Ambulatory Visit: Payer: Self-pay | Admitting: *Deleted

## 2014-10-01 DIAGNOSIS — M1712 Unilateral primary osteoarthritis, left knee: Secondary | ICD-10-CM

## 2014-10-01 NOTE — Telephone Encounter (Signed)
Given the results of the urine drug screen in December 2015, patient needs to be seen in clinic and the findings discussed.

## 2014-10-01 NOTE — Telephone Encounter (Signed)
Pt scheduled for tomorrow with PCP

## 2014-10-01 NOTE — Telephone Encounter (Signed)
Last refill 1/15 Pt # [phone]

## 2014-10-04 ENCOUNTER — Emergency Department: Payer: Self-pay | Admitting: Emergency Medicine

## 2014-10-09 ENCOUNTER — Encounter: Payer: Self-pay | Admitting: Internal Medicine

## 2014-10-09 ENCOUNTER — Ambulatory Visit (INDEPENDENT_AMBULATORY_CARE_PROVIDER_SITE_OTHER): Payer: Self-pay | Admitting: Internal Medicine

## 2014-10-09 VITALS — BP 144/95 | HR 92 | Temp 98.1°F | Ht 74.0 in | Wt 343.5 lb

## 2014-10-09 DIAGNOSIS — F32A Depression, unspecified: Secondary | ICD-10-CM

## 2014-10-09 DIAGNOSIS — N182 Chronic kidney disease, stage 2 (mild): Secondary | ICD-10-CM

## 2014-10-09 DIAGNOSIS — K219 Gastro-esophageal reflux disease without esophagitis: Secondary | ICD-10-CM

## 2014-10-09 DIAGNOSIS — Z7901 Long term (current) use of anticoagulants: Secondary | ICD-10-CM

## 2014-10-09 DIAGNOSIS — D751 Secondary polycythemia: Secondary | ICD-10-CM

## 2014-10-09 DIAGNOSIS — I10 Essential (primary) hypertension: Secondary | ICD-10-CM

## 2014-10-09 DIAGNOSIS — M1712 Unilateral primary osteoarthritis, left knee: Secondary | ICD-10-CM

## 2014-10-09 DIAGNOSIS — F329 Major depressive disorder, single episode, unspecified: Secondary | ICD-10-CM

## 2014-10-09 DIAGNOSIS — I2699 Other pulmonary embolism without acute cor pulmonale: Secondary | ICD-10-CM

## 2014-10-09 LAB — BASIC METABOLIC PANEL WITH GFR
BUN: 13 mg/dL (ref 6–23)
CO2: 24 mEq/L (ref 19–32)
Calcium: 9.5 mg/dL (ref 8.4–10.5)
Chloride: 101 mEq/L (ref 96–112)
Creat: 1.72 mg/dL — ABNORMAL HIGH (ref 0.50–1.35)
GFR, Est African American: 57 mL/min — ABNORMAL LOW
GFR, Est Non African American: 50 mL/min — ABNORMAL LOW
Glucose, Bld: 91 mg/dL (ref 70–99)
Potassium: 3.9 mEq/L (ref 3.5–5.3)
Sodium: 137 mEq/L (ref 135–145)

## 2014-10-09 LAB — CBC WITH DIFFERENTIAL/PLATELET
Basophils Absolute: 0.1 10*3/uL (ref 0.0–0.1)
Basophils Relative: 1 % (ref 0–1)
Eosinophils Absolute: 0.2 10*3/uL (ref 0.0–0.7)
Eosinophils Relative: 2 % (ref 0–5)
HCT: 52.7 % — ABNORMAL HIGH (ref 39.0–52.0)
Hemoglobin: 18.1 g/dL — ABNORMAL HIGH (ref 13.0–17.0)
Lymphocytes Relative: 21 % (ref 12–46)
Lymphs Abs: 1.8 10*3/uL (ref 0.7–4.0)
MCH: 30.9 pg (ref 26.0–34.0)
MCHC: 34.3 g/dL (ref 30.0–36.0)
MCV: 89.9 fL (ref 78.0–100.0)
MPV: 9.8 fL (ref 8.6–12.4)
Monocytes Absolute: 1 10*3/uL (ref 0.1–1.0)
Monocytes Relative: 12 % (ref 3–12)
Neutro Abs: 5.4 10*3/uL (ref 1.7–7.7)
Neutrophils Relative %: 64 % (ref 43–77)
Platelets: 284 10*3/uL (ref 150–400)
RBC: 5.86 MIL/uL — ABNORMAL HIGH (ref 4.22–5.81)
RDW: 13.8 % (ref 11.5–15.5)
WBC: 8.5 10*3/uL (ref 4.0–10.5)

## 2014-10-09 MED ORDER — WARFARIN SODIUM 10 MG PO TABS: 20.0000 mg | ORAL_TABLET | Freq: Every day | ORAL | Status: DC

## 2014-10-09 MED ORDER — LISINOPRIL-HYDROCHLOROTHIAZIDE 20-12.5 MG PO TABS: 1.0000 | ORAL_TABLET | Freq: Every day | ORAL | Status: DC

## 2014-10-09 MED ORDER — HYDROCODONE-ACETAMINOPHEN 5-325 MG PO TABS: 1.0000 | ORAL_TABLET | Freq: Four times a day (QID) | ORAL | Status: DC | PRN

## 2014-10-09 MED ORDER — RANITIDINE HCL 150 MG PO TABS: 150.0000 mg | ORAL_TABLET | Freq: Two times a day (BID) | ORAL | Status: DC

## 2014-10-09 NOTE — Assessment & Plan Note (Addendum)
Likely secondary to OSA as patient has increased hemoglobin 18.9 on 05/24/14, but normal Epo. Patient is not compliant with CPAP.  Plan: -Recheck CBC -If hemoglobin increasing, schedule for sleep study  Addendum: -Hemoglobin 18.1 -Although decreased, discuss sleep study at following visit

## 2014-10-09 NOTE — Assessment & Plan Note (Addendum)
Previously thought to be secondary to uncontrolled hypertension. Baseline Creatinine 1.6, increased to 2.34 in October 2015 likely secondary to dehydration from nausea and vomiting. Patient is on lisinopril-HCTZ 10-12.5 mg daily.   Plan: -Recheck BMET today -Increased lisinopril-hctz to 20-12.5 mg daily  Addendum: -BUN 13/Creatinine 1.72

## 2014-10-09 NOTE — Patient Instructions (Signed)
General Instructions:   Please bring your medicines with you each time you come to clinic.  Medicines may include prescription medications, over-the-counter medications, herbal remedies, eye drops, vitamins, or other pills.   FOR YOUR HISTORY OF BLOOD CLOTS: -TAKE COUMADIN 20 MG DAILY AT [facility]NER (2 PILLS) -SCHEDULE APPOINTMENT WITH DR. Elie Confer FOR 1 WEEK FOR INR CHECK  FOR YOUR HIGH BLOOD PRESSURE: -PLEASE PICK UP NEW PRESCRIPTION OF LISINOPRIL-HCTZ 20-12.5 MG DAILY -RETURN IN 1 MONTH FOR BP RECHECK  FOR YOUR PAIN: -I HAVE REFILLED HYDROCODONE TODAY -RECHECK DRUG SCREEN IN 1 MONTH -NO MORE PAIN MEDICINE IF COCAINE POSITIVE IN THE FUTURE  Pulmonary Embolism A pulmonary (lung) embolism (PE) is a blood clot that has traveled to the lung and results in a blockage of blood flow in the affected lung. Most clots come from deep veins in the legs or pelvis. PE is a dangerous and potentially life-threatening condition that can be treated if identified. CAUSES Blood clots form in a vein for different reasons. Usually several things cause blood clots. They include:  The flow of blood slows down.  The inside of the vein is damaged in some way.  The person has a condition that makes the blood clot more easily. RISK FACTORS Some people are more likely than others to develop PE. Risk factors include:   Smoking.  Being overweight (obese).  Sitting or lying still for a long time. This includes long-distance travel, paralysis, or recovery from an illness or surgery. Other factors that increase risk are:   Older age, especially over 52 years of age.  Having a family history of blood clots or if you have already had a blood clot.  Having major or lengthy surgery. This is especially true for surgery on the hip, knee, or belly (abdomen). Hip surgery is particularly high risk.  Having a long, thin tube (catheter) placed inside a vein during a medical procedure.  Breaking a hip or leg.  Having  cancer or cancer treatment.  Medicines containing the male hormone estrogen. This includes birth control pills and hormone replacement therapy.  Other circulation or heart problems.  Pregnancy and childbirth.  Hormone changes make the blood clot more easily during pregnancy.  The fetus puts pressure on the veins of the pelvis.  There is a risk of injury to veins during delivery or a caesarean delivery. The risk is highest just after childbirth.  PREVENTION   Exercise the legs regularly. Take a brisk 30 minute walk every day.  Maintain a weight that is appropriate for your height.  Avoid sitting or lying in bed for long periods of time without moving your legs.  Women, particularly those over the age of 58 years, should consider the risks and benefits of taking estrogen medicines, including birth control pills.  Do not smoke, especially if you take estrogen medicines.  Long-distance travel can increase your risk. You should exercise your legs by walking or pumping the muscles every hour.  Many of the risk factors above relate to situations that exist with hospitalization, either for illness, injury, or elective surgery. Prevention may include medical and nonmedical measures.   Your health care provider will assess you for the need for venous thromboembolism prevention when you are admitted to the hospital. If you are having surgery, your surgeon will assess you the day of or day after surgery.  SYMPTOMS  The symptoms of a PE usually start suddenly and include:  Shortness of breath.  Coughing.  Coughing up blood or blood-tinged  mucus.  Chest pain. Pain is often worse with deep breaths.  Rapid heartbeat. DIAGNOSIS  If a PE is suspected, your health care provider will take a medical history and perform a physical exam. Other tests that may be required include:  Blood tests, such as studies of the clotting properties of your blood.  Imaging tests, such as ultrasound,  CT, MRI, and other tests to see if you have clots in your legs or lungs.  An electrocardiogram. This can look for heart strain from blood clots in the lungs. TREATMENT   The most common treatment for a PE is blood thinning (anticoagulant) medicine, which reduces the blood's tendency to clot. Anticoagulants can stop new blood clots from forming and old clots from growing. They cannot dissolve existing clots. Your body does this by itself over time. Anticoagulants can be given by mouth, through an intravenous (IV) tube, or by injection. Your health care provider will determine the best program for you.  Less commonly, clot-dissolving medicines (thrombolytics) are used to dissolve a PE. They carry a high risk of bleeding, so they are used mainly in severe cases.  Very rarely, a blood clot in the leg needs to be removed surgically.  If you are unable to take anticoagulants, your health care provider may arrange for you to have a filter placed in a main vein in your abdomen. This filter prevents clots from traveling to your lungs. HOME CARE INSTRUCTIONS   Take all medicines as directed by your health care provider.  Learn as much as you can about DVT.  Wear a medical alert bracelet or carry a medical alert card.  Ask your health care provider how soon you can go back to normal activities. It is important to stay active to prevent blood clots. If you are on anticoagulant medicine, avoid contact sports.  It is very important to exercise. This is especially important while traveling, sitting, or standing for long periods of time. Exercise your legs by walking or by tightening and relaxing your leg muscles regularly. Take frequent walks.  You may need to wear compression stockings. These are tight elastic stockings that apply pressure to the lower legs. This pressure can help keep the blood in the legs from clotting. Taking Warfarin Warfarin is a daily medicine that is taken by mouth. Your health  care provider will advise you on the length of treatment (usually 3-6 months, sometimes lifelong). If you take warfarin:  Understand how to take warfarin and foods that can affect how warfarin works in Veterinary surgeon.  Too much and too little warfarin are both dangerous. Too much warfarin increases the risk of bleeding. Too little warfarin continues to allow the risk for blood clots. Warfarin and Regular Blood Testing While taking warfarin, you will need to have regular blood tests to measure your blood clotting time. These blood tests usually include both the prothrombin time (PT) and international normalized ratio (INR) tests. The PT and INR results allow your health care provider to adjust your dose of warfarin. It is very important that you have your PT and INR tested as often as directed by your health care provider.  Warfarin and Your Diet Avoid major changes in your diet, or notify your health care provider before changing your diet. Arrange a visit with a registered dietitian to answer your questions. Many foods, especially foods high in vitamin K, can interfere with warfarin and affect the PT and INR results. You should eat a consistent amount of foods high in  vitamin K. Foods high in vitamin K include:   Spinach, kale, broccoli, cabbage, collard and turnip greens, Brussels sprouts, peas, cauliflower, seaweed, and parsley.  Beef and pork liver.  Green tea.  Soybean oil. Warfarin with Other Medicines Many medicines can interfere with warfarin and affect the PT and INR results. You must:  Tell your health care provider about any and all medicines, vitamins, and supplements you take, including aspirin and other over-the-counter anti-inflammatory medicines. Be especially cautious with aspirin and anti-inflammatory medicines. Ask your health care provider before taking these.  Do not take or discontinue any prescribed or over-the-counter medicine except on the advice of your health care provider  or pharmacist. Warfarin Side Effects Warfarin can have side effects, such as easy bruising and difficulty stopping bleeding. Ask your health care provider or pharmacist about other side effects of warfarin. You will need to:  Hold pressure over cuts for longer than usual.  Notify your dentist and other health care providers that you are taking warfarin before you undergo any procedures where bleeding may occur. Warfarin with Alcohol and Tobacco   Drinking alcohol frequently can increase the effect of warfarin, leading to excess bleeding. It is best to avoid alcoholic drinks or consume only very small amounts while taking warfarin. Notify your health care provider if you change your alcohol intake.  Do not use any tobacco products including cigarettes, chewing tobacco, or electronic cigarettes. If you smoke, quit. Ask your health care provider for help with quitting smoking. Alternative Medicines to Warfarin: Factor Xa Inhibitor Medicines  These blood thinning medicines are taken by mouth, usually for several weeks or longer. It is important to take the medicine every single day, at the same time each day.  There are no regular blood tests required when using these medicines.  There are fewer food and drug interactions than with warfarin.  The side effects of this class of medicine is similar to that of warfarin, including excessive bruising or bleeding. Ask your health care provider or pharmacist about other potential side effects. SEEK MEDICAL CARE IF:   You notice a rapid heartbeat.  You feel weaker or more tired than usual.  You feel faint.  You notice increased bruising.  Your symptoms are not getting better in the time expected.  You are having side effects of medicine. SEEK IMMEDIATE MEDICAL CARE IF:   You have chest pain.  You have trouble breathing.  You have new or increased swelling or pain in one leg.  You cough up blood.  You notice blood in vomit, in a bowel  movement, or in urine.  You have a fever. Symptoms of PE may represent a serious problem that is an emergency. Do not wait to see if the symptoms will go away. Get medical help right away. Call your local emergency services (911 in the [place]). Do not drive yourself to the hospital. Document Released: 07/30/2000 Document Revised: 12/17/2013 Document Reviewed: 08/13/2013 [facility] Patient Information 2015 [place], [place]. This information is not intended to replace advice given to you by your health care provider. Make sure you discuss any questions you have with your health care provider.

## 2014-10-09 NOTE — Progress Notes (Signed)
Medicine attending: Medical history, presenting problems, physical findings, and medications, reviewed with Dr Osa Craver and I concur with her evaluation and management plan. We will refill his pain meds for his knee  contingent on his stopping cocaine. He will go back on coumadin 20 mg daily. He can not afford Xarelto. We will enquire re a home coumadin monitor given his distance from our center and compliance issues,
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
## 2014-10-10 DIAGNOSIS — F32A Depression, unspecified: Secondary | ICD-10-CM | POA: Insufficient documentation

## 2014-10-10 DIAGNOSIS — F329 Major depressive disorder, single episode, unspecified: Secondary | ICD-10-CM | POA: Insufficient documentation

## 2014-10-10 NOTE — Assessment & Plan Note (Addendum)
BP Readings from Last 3 Encounters:  10/09/14 144/95  09/04/14 157/109  07/24/14 143/97    Lab Results  Component Value Date   NA 137 10/09/2014   K 3.9 10/09/2014   CREATININE 1.72* 10/09/2014    Assessment: Blood pressure control:  Uncontrolled last 2 visits Progress toward BP goal:   Deteriorated Comments: Confusion over what patient should be taking. Patient has only been taking lisinopril-HCTZ 10-12.5 mg daily; whereas previous notes indicate that he should have been taking 2 pill for a total of lisinopril-HCTZ 20-25 mg daily.   Plan: Medications:  Increase to lisinopril-HCTZ 20-12.5 mg daily Other plans: Follow up in one month for recheck and repeat BMET

## 2014-10-10 NOTE — Assessment & Plan Note (Addendum)
Patient has history of unprovoked bilateral PE in 2013 and was previously managed on coumadin. However, patient was difficult to be therapeutic, non-compliant with INR checks and was transitioned to Xarelto last year. Patient was given free one month supply and then arranged to be set up with orange card to receive Xarelto from [facility]. Patient states he went, but never heard from [facility] and states he cannot afford Xarelto, therefore he has not been taking any blood thinners for about 2 months. Patient is requesting to go back on Coumadin. He denies lower extremity edema, calf tenderness, shortness of breath, fast heart rate or chest pain. This case was discussed with Dr. Beryle Beams. While coumadin is not the best option for this patient given non-compliance, we are stuck without being able to get him Xarelto. We may be able to start him back at some point, but in the mean time patient will be started back on coumadin at his previous dose.  Plan: -Coumadin 20 mg QHS start on 2/29 -INR check with Dr. Elie Confer on 3/7 one week later -Take daily ASA until starting on Coumadin -Go to ED if developing any shortness of breath, chest pain or tachycardia -Follow up in 1 month -Discuss issue with SW to see status of orange card for patient -Inquire about home coumadin monitor for INR checks due to compliance issues

## 2014-10-10 NOTE — Assessment & Plan Note (Signed)
Controlled with ranitidine 150 mg BID.
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
## 2014-10-10 NOTE — Assessment & Plan Note (Signed)
Patient is on a pain contract with [facility] since 2014 for his hydrocodone-APAP 5-325 1-2 tabs Q6H prn pain #180/month. Last December, patient was positive for cocaine. This result was discussed with the patient. He admitted to cocaine use due to increased stress over custody battle and relapsed with his friends. His last use was last Friday. This is patient's first known violation. Patient is very remorseful and states he will quit. I told patient he must come back in one month for UDS. If he every tests positive again for cocaine, [facility] will no longer prescribe his narcotic medications. Patient voices understanding. He also understands he is only to get pain medications from [facility] and is subject to routine UDS. Patient understands. He is also following with Sports Medicine for steroid injections every 6 months. We also discussed that patient would greatly benefit from weight loss and exercise.  Plan: -Refill Hydrocodone-APAP 5-325 mg 1-2 tabs Q6H prn  -UDS in one month prior to refill -Encouraged weight loss and exercise

## 2014-10-11 ENCOUNTER — Telehealth: Payer: Self-pay | Admitting: Licensed Clinical Social Worker

## 2014-10-11 NOTE — Telephone Encounter (Signed)
CSW received call from [facility].  On application on file for Mr. Badolato at either [facility] location.  CSW placed call to Mr. Hain to refer pt to the [facility] office, pt will not return home until after CSW business hours 2pm.

## 2014-10-11 NOTE — Telephone Encounter (Signed)
[name] was referred to CSW as pt voiced to PCP that he completed application at [facility] to obtain Xarelto but has yet to hear back.  Pt has been without Xarelto.  CSW placed call to [facility] to confirm if application had been completed and if Xarelto is a medication they assist with.  Awaiting response.

## 2014-10-12 ENCOUNTER — Emergency Department (HOSPITAL_COMMUNITY): Payer: Self-pay

## 2014-10-12 ENCOUNTER — Emergency Department (HOSPITAL_COMMUNITY)
Admission: EM | Admit: 2014-10-12 | Discharge: 2014-10-12 | Disposition: A | Discharge status: Home / Self Care | Payer: Self-pay | Attending: Emergency Medicine | Admitting: Emergency Medicine

## 2014-10-12 ENCOUNTER — Encounter (HOSPITAL_COMMUNITY): Payer: Self-pay | Admitting: Nurse Practitioner

## 2014-10-12 DIAGNOSIS — S60212A Contusion of left wrist, initial encounter: Secondary | ICD-10-CM | POA: Insufficient documentation

## 2014-10-12 DIAGNOSIS — Z720 Tobacco use: Secondary | ICD-10-CM | POA: Insufficient documentation

## 2014-10-12 DIAGNOSIS — Y998 Other external cause status: Secondary | ICD-10-CM | POA: Insufficient documentation

## 2014-10-12 DIAGNOSIS — Y9289 Other specified places as the place of occurrence of the external cause: Secondary | ICD-10-CM | POA: Insufficient documentation

## 2014-10-12 DIAGNOSIS — Z8669 Personal history of other diseases of the nervous system and sense organs: Secondary | ICD-10-CM | POA: Insufficient documentation

## 2014-10-12 DIAGNOSIS — W228XXA Striking against or struck by other objects, initial encounter: Secondary | ICD-10-CM | POA: Insufficient documentation

## 2014-10-12 DIAGNOSIS — I10 Essential (primary) hypertension: Secondary | ICD-10-CM | POA: Insufficient documentation

## 2014-10-12 DIAGNOSIS — Z86711 Personal history of pulmonary embolism: Secondary | ICD-10-CM | POA: Insufficient documentation

## 2014-10-12 DIAGNOSIS — Y9301 Activity, walking, marching and hiking: Secondary | ICD-10-CM | POA: Insufficient documentation

## 2014-10-12 DIAGNOSIS — Z8639 Personal history of other endocrine, nutritional and metabolic disease: Secondary | ICD-10-CM | POA: Insufficient documentation

## 2014-10-12 DIAGNOSIS — F419 Anxiety disorder, unspecified: Secondary | ICD-10-CM | POA: Insufficient documentation

## 2014-10-12 DIAGNOSIS — Z7901 Long term (current) use of anticoagulants: Secondary | ICD-10-CM | POA: Insufficient documentation

## 2014-10-12 DIAGNOSIS — Z79899 Other long term (current) drug therapy: Secondary | ICD-10-CM | POA: Insufficient documentation

## 2014-10-12 MED ORDER — OXYCODONE-ACETAMINOPHEN 5-325 MG PO TABS
1.0000 | ORAL_TABLET | Freq: Once | ORAL | Status: AC
  Administered 2014-10-12: 1 via ORAL
  Filled 2014-10-12: qty 1

## 2014-10-12 MED ORDER — HYDROMORPHONE HCL 1 MG/ML IJ SOLN
1.0000 mg | Freq: Once | INTRAMUSCULAR | Status: AC
  Administered 2014-10-12: 1 mg via INTRAVENOUS
  Filled 2014-10-12: qty 1

## 2014-10-12 MED ORDER — OXYCODONE-ACETAMINOPHEN 5-325 MG PO TABS: 1.0000 | ORAL_TABLET | ORAL | Status: DC | PRN
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
## 2014-10-12 NOTE — Discharge Instructions (Signed)
Contusion °A contusion is a deep bruise. Contusions happen when an injury causes bleeding under the skin. Signs of bruising include pain, puffiness (swelling), and discolored skin. The contusion may turn blue, purple, or yellow. °HOME CARE  °· Put ice on the injured area. °¨ Put ice in a plastic bag. °¨ Place a towel between your skin and the bag. °¨ Leave the ice on for 15-20 minutes, 03-04 times a day. °· Only take medicine as told by your doctor. °· Rest the injured area. °· If possible, raise (elevate) the injured area to lessen puffiness. °GET HELP RIGHT AWAY IF:  °· You have more bruising or puffiness. °· You have pain that is getting worse. °· Your puffiness or pain is not helped by medicine. °MAKE SURE YOU:  °· Understand these instructions. °· Will watch your condition. °· Will get help right away if you are not doing well or get worse. °Document Released: 01/19/2008 Document Revised: 10/25/2011 Document Reviewed: 06/07/2011 °ExitCare® Patient Information ©2015 [employer]. This information is not intended to replace advice given to you by your health care provider. Make sure you discuss any questions you have with your health care provider. ° °Cryotherapy °Cryotherapy means treatment with cold. Ice or gel packs can be used to reduce both pain and swelling. Ice is the most helpful within the first 24 to 48 hours after an injury or flare-up from overusing a muscle or joint. Sprains, strains, spasms, burning pain, shooting pain, and aches can all be eased with ice. Ice can also be used when recovering from surgery. Ice is effective, has very few side effects, and is safe for most people to use. °PRECAUTIONS  °Ice is not a safe treatment option for people with: °· Raynaud phenomenon. This is a condition affecting small blood vessels in the extremities. Exposure to cold may cause your problems to return. °· Cold hypersensitivity. There are many forms of cold hypersensitivity, including: °¨ Cold urticaria.  Red, itchy hives appear on the skin when the tissues begin to warm after being iced. °¨ Cold erythema. This is a red, itchy rash caused by exposure to cold. °¨ Cold hemoglobinuria. Red blood cells break down when the tissues begin to warm after being iced. The hemoglobin that carry oxygen are passed into the urine because they cannot combine with blood proteins fast enough. °· Numbness or altered sensitivity in the area being iced. °If you have any of the following conditions, do not use ice until you have discussed cryotherapy with your caregiver: °· Heart conditions, such as arrhythmia, angina, or chronic heart disease. °· High blood pressure. °· Healing wounds or open skin in the area being iced. °· Current infections. °· Rheumatoid arthritis. °· Poor circulation. °· Diabetes. °Ice slows the blood flow in the region it is applied. This is beneficial when trying to stop inflamed tissues from spreading irritating chemicals to surrounding tissues. However, if you expose your skin to cold temperatures for too long or without the proper protection, you can damage your skin or nerves. Watch for signs of skin damage due to cold. °HOME CARE INSTRUCTIONS °Follow these tips to use ice and cold packs safely. °· Place a dry or damp towel between the ice and skin. A damp towel will cool the skin more quickly, so you may need to shorten the time that the ice is used. °· For a more rapid response, add gentle compression to the ice. °· Ice for no more than 10 to 20 minutes at a time.   The bonier the area you are icing, the less time it will take to get the benefits of ice. °· Check your skin after 5 minutes to make sure there are no signs of a poor response to cold or skin damage. °· Rest 20 minutes or more between uses. °· Once your skin is numb, you can end your treatment. You can test numbness by very lightly touching your skin. The touch should be so light that you do not see the skin dimple from the pressure of your  fingertip. When using ice, most people will feel these normal sensations in this order: cold, burning, aching, and numbness. °· Do not use ice on someone who cannot communicate their responses to pain, such as small children or people with dementia. °HOW TO MAKE AN ICE PACK °Ice packs are the most common way to use ice therapy. Other methods include ice massage, ice baths, and cryosprays. Muscle creams that cause a cold, tingly feeling do not offer the same benefits that ice offers and should not be used as a substitute unless recommended by your caregiver. °To make an ice pack, do one of the following: °· Place crushed ice or a bag of frozen vegetables in a sealable plastic bag. Squeeze out the excess air. Place this bag inside another plastic bag. Slide the bag into a pillowcase or place a damp towel between your skin and the bag. °· Mix 3 parts water with 1 part rubbing alcohol. Freeze the mixture in a sealable plastic bag. When you remove the mixture from the freezer, it will be slushy. Squeeze out the excess air. Place this bag inside another plastic bag. Slide the bag into a pillowcase or place a damp towel between your skin and the bag. °SEEK MEDICAL CARE IF: °· You develop white spots on your skin. This may give the skin a blotchy (mottled) appearance. °· Your skin turns blue or pale. °· Your skin becomes waxy or hard. °· Your swelling gets worse. °MAKE SURE YOU:  °· Understand these instructions. °· Will watch your condition. °· Will get help right away if you are not doing well or get worse. °Document Released: 03/29/2011 Document Revised: 12/17/2013 Document Reviewed: 03/29/2011 °ExitCare® Patient Information ©2015 [employer]. This information is not intended to replace advice given to you by your health care provider. Make sure you discuss any questions you have with your health care provider. ° °
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
## 2014-10-15 ENCOUNTER — Encounter: Payer: Self-pay | Admitting: Licensed Clinical Social Worker

## 2014-10-15 NOTE — Telephone Encounter (Signed)
No response back from Mr. Vanderlinde.  CSW mailed information on MAP and Xarelto PAP application.

## 2014-10-18 ENCOUNTER — Telehealth: Payer: Self-pay | Admitting: Pharmacist

## 2014-10-18 NOTE — Telephone Encounter (Signed)
Call to patient to confirm appointment for 10/21/14 at 8:30 no answer

## 2014-10-21 ENCOUNTER — Ambulatory Visit (INDEPENDENT_AMBULATORY_CARE_PROVIDER_SITE_OTHER): Payer: Self-pay | Admitting: Pharmacist

## 2014-10-21 DIAGNOSIS — Z7901 Long term (current) use of anticoagulants: Secondary | ICD-10-CM

## 2014-10-21 DIAGNOSIS — D688 Other specified coagulation defects: Secondary | ICD-10-CM

## 2014-10-21 DIAGNOSIS — I2699 Other pulmonary embolism without acute cor pulmonale: Secondary | ICD-10-CM

## 2014-10-21 DIAGNOSIS — D6851 Activated protein C resistance: Secondary | ICD-10-CM

## 2014-10-21 LAB — POCT INR: INR: 1

## 2014-10-21 NOTE — Patient Instructions (Signed)
Patient instructed to take medications as defined in the Anti-coagulation Track section of this encounter.  Patient instructed to take today's dose.  Patient verbalized understanding of these instructions.    

## 2014-10-21 NOTE — Progress Notes (Signed)
Anti-Coagulation Progress Note  Donald Brady is a 39 y.o. male who is currently on an anti-coagulation regimen.    RECENT RESULTS: Recent results are below, the most recent result is correlated with a dose of ZERO mg. per week:  He had been on rivaroxaban/Xarelto but elected to discontinue and switch back to warfarin because he was unable to pay out of pocket for the rivaroxaban/Xarelto and had expended his ability to get the medication through the manufacturer's patient medication assistance program. He had been instructed to commence his warfarin TODAY, i.e. The INR of today reflects baseline. Warfarin is being commenced at a lowered dose than his last documented total weekly dose of 195mg /wk---since, this is re-initiation of warfarin. Will re-evaluate his INR response next Monday to which he agrees. The issue of compliance as well as RTC visits for frequent INR monitoring (and wildly fluctuating INR response/time in target range) had been the impetus for his switch from warfarin to a direct oral anticoagulant (rivaroxaban/Xarelto).  Lab Results  Component Value Date   INR 1.0 10/21/2014   INR 0.99 05/15/2014   INR 4.30 12/24/2013    ANTI-COAG DOSE: Anticoagulation Dose Instructions as of 10/21/2014      [name] Tue Wed Thu Fri Sat   New Dose 20 mg 20 mg 20 mg 20 mg 20 mg 20 mg 20 mg    Description        As per doseresponse provided dosing instruction sheet.       ANTICOAG SUMMARY: Anticoagulation Episode Summary    Current INR goal 2.0-3.0  Next INR check 10/28/2014  INR from last check 1.0! (10/21/2014)  Weekly max dose   Target end date Indefinite  [facility] check [facility]  Preferred lab   Send INR reminders to    Indications  Long term current use of anticoagulant therapy [Z79.01] Bilateral pulmonary embolism [I26.99] Factor 5 Leiden mutation heterozygous [D68.8]        Comments         ANTICOAG TODAY: Anticoagulation Summary as of 10/21/2014    INR goal  2.0-3.0  Selected INR 1.0! (10/21/2014)  Next INR check 10/28/2014  Target end date Indefinite   Indications  Long term current use of anticoagulant therapy [Z79.01] Bilateral pulmonary embolism [I26.99] Factor 5 Leiden mutation heterozygous [D68.8]      Anticoagulation Episode Summary    [facility] check [facility]   Preferred lab    Send INR reminders to    Comments       PATIENT INSTRUCTIONS: Patient Instructions  Patient instructed to take medications as defined in the Anti-coagulation Track section of this encounter.  Patient instructed to take today's dose.  Patient verbalized understanding of these instructions.       FOLLOW-UP Return in 7 days (on 10/28/2014) for Follow up INR at 0915h.  [name], III Pharm.D., CACP

## 2014-10-28 ENCOUNTER — Ambulatory Visit (INDEPENDENT_AMBULATORY_CARE_PROVIDER_SITE_OTHER): Payer: Self-pay | Admitting: Pharmacist

## 2014-10-28 DIAGNOSIS — I2699 Other pulmonary embolism without acute cor pulmonale: Secondary | ICD-10-CM

## 2014-10-28 DIAGNOSIS — D6851 Activated protein C resistance: Secondary | ICD-10-CM

## 2014-10-28 DIAGNOSIS — Z7901 Long term (current) use of anticoagulants: Secondary | ICD-10-CM

## 2014-10-28 LAB — POCT INR: INR: 1.1

## 2014-10-28 NOTE — Progress Notes (Signed)
Anti-Coagulation Progress Note  Donald Brady is a 39 y.o. male who is currently on an anti-coagulation regimen.    RECENT RESULTS: Recent results are below, the most recent result is correlated with a dose of 140 mg. per week as a new start (had been on Xarelto but could not access drug by manufacturer's patient assistance program as his period of eligibility had elapsed). A decision by his PCP to recommence upon warfarin with advice and consent of the patient was undertaken. We started at 140mg /wk (previous dosing history) to make certain that nothing pathophysiologically had changed that would have caused a marked hypoprothrombinemic response to this previously used regimen. Having not seen such an effect, we will escalate dose to a previous used regimen which had provided the desired response.  Lab Results  Component Value Date   INR 1.10 10/28/2014   INR 1.0 10/21/2014   INR 0.99 05/15/2014    ANTI-COAG DOSE: Anticoagulation Dose Instructions as of 10/28/2014      [name] Tue Wed Thu Fri Sat   New Dose 25 mg 30 mg 30 mg 30 mg 25 mg 25 mg 25 mg    Description        As per doseresponse provided dosing instruction sheet.       ANTICOAG SUMMARY: Anticoagulation Episode Summary    Current INR goal 2.0-3.0  Next INR check 11/04/2014  INR from last check 1.10! (10/28/2014)  Weekly max dose   Target end date Indefinite  [facility]  Preferred lab   Send INR reminders to    Indications  Long term current use of anticoagulant therapy [Z79.01] Bilateral pulmonary embolism [I26.99] Factor 5 Leiden mutation heterozygous [D68.8]        Comments         ANTICOAG TODAY: Anticoagulation Summary as of 10/28/2014    INR goal 2.0-3.0  Selected INR 1.10! (10/28/2014)  Next INR check 11/04/2014  Target end date Indefinite   Indications  Long term current use of anticoagulant therapy [Z79.01] Bilateral pulmonary embolism [I26.99] Factor 5 Leiden  mutation heterozygous [D68.8]      Anticoagulation Episode Summary    [facility] check [facility]   Preferred lab    Send INR reminders to    Comments       PATIENT INSTRUCTIONS: Patient Instructions  Patient instructed to take medications as defined in the Anti-coagulation Track section of this encounter.  Patient instructed to take today's dose.  Patient verbalized understanding of these instructions.       FOLLOW-UP Return in 7 days (on 11/04/2014) for Follow up INR at 0930h.  [name], III Pharm.D., CACP

## 2014-10-28 NOTE — Patient Instructions (Signed)
Patient instructed to take medications as defined in the Anti-coagulation Track section of this encounter.  Patient instructed to take today's dose.  Patient verbalized understanding of these instructions.    

## 2014-10-31 ENCOUNTER — Telehealth: Payer: Self-pay | Admitting: Pharmacist

## 2014-10-31 NOTE — Telephone Encounter (Signed)
Call to patient to confirm appointment for 11/04/14 at 9:30 no answer

## 2014-11-04 ENCOUNTER — Ambulatory Visit: Payer: Self-pay

## 2014-11-04 ENCOUNTER — Other Ambulatory Visit: Payer: Self-pay | Admitting: Internal Medicine

## 2014-11-04 ENCOUNTER — Other Ambulatory Visit: Payer: Self-pay | Admitting: *Deleted

## 2014-11-04 DIAGNOSIS — M1712 Unilateral primary osteoarthritis, left knee: Secondary | ICD-10-CM

## 2014-11-04 DIAGNOSIS — F1410 Cocaine abuse, uncomplicated: Secondary | ICD-10-CM

## 2014-11-04 NOTE — Telephone Encounter (Signed)
Patient must complete UDS prior to refill. There is a future order for UDS in the chart.

## 2014-11-05 NOTE — Telephone Encounter (Signed)
Attempted to call pt to schedule appt, no answer, no vmail

## 2014-11-06 ENCOUNTER — Other Ambulatory Visit: Payer: Self-pay

## 2014-11-06 ENCOUNTER — Ambulatory Visit (INDEPENDENT_AMBULATORY_CARE_PROVIDER_SITE_OTHER): Payer: Self-pay | Admitting: Pharmacist

## 2014-11-06 DIAGNOSIS — F1410 Cocaine abuse, uncomplicated: Secondary | ICD-10-CM

## 2014-11-06 DIAGNOSIS — Z7901 Long term (current) use of anticoagulants: Secondary | ICD-10-CM

## 2014-11-06 DIAGNOSIS — D688 Other specified coagulation defects: Secondary | ICD-10-CM

## 2014-11-06 DIAGNOSIS — I2699 Other pulmonary embolism without acute cor pulmonale: Secondary | ICD-10-CM

## 2014-11-06 DIAGNOSIS — D6851 Activated protein C resistance: Secondary | ICD-10-CM

## 2014-11-06 LAB — POCT INR: INR: 1.7

## 2014-11-06 NOTE — Telephone Encounter (Signed)
Pt will come 4/11 dr Heber Rutland

## 2014-11-06 NOTE — Telephone Encounter (Signed)
Urine,done, pt very insistent to do UDS, appt made for 2 1/2 to 3 weeks from now for eval and uds

## 2014-11-06 NOTE — Patient Instructions (Signed)
Patient instructed to take medications as defined in the Anti-coagulation Track section of this encounter.  Patient instructed to take today's dose.  Patient verbalized understanding of these instructions.    

## 2014-11-06 NOTE — Progress Notes (Signed)
Anti-Coagulation Progress Note  Donald Brady is a 39 y.o. male who is currently on an anti-coagulation regimen.    RECENT RESULTS: Recent results are below, the most recent result is correlated with a dose of 190 mg. per week: Lab Results  Component Value Date   INR 1.70 11/06/2014   INR 1.10 10/28/2014   INR 1.0 10/21/2014    ANTI-COAG DOSE: Anticoagulation Dose Instructions as of 11/06/2014      [name] Tue Wed Thu Fri Sat   New Dose 30 mg 30 mg 30 mg 40 mg 30 mg 30 mg 30 mg    Description        As per doseresponse provided dosing instruction sheet.       ANTICOAG SUMMARY: Anticoagulation Episode Summary    Current INR goal 2.0-3.0  Next INR check 11/25/2014  INR from last check 1.70! (11/06/2014)  Weekly max dose   Target end date Indefinite  [facility]  Preferred lab   Send INR reminders to    Indications  Long term current use of anticoagulant therapy [Z79.01] Bilateral pulmonary embolism [I26.99] Factor 5 Leiden mutation heterozygous [D68.8]        Comments         ANTICOAG TODAY: Anticoagulation Summary as of 11/06/2014    INR goal 2.0-3.0  Selected INR 1.70! (11/06/2014)  Next INR check 11/25/2014  Target end date Indefinite   Indications  Long term current use of anticoagulant therapy [Z79.01] Bilateral pulmonary embolism [I26.99] Factor 5 Leiden mutation heterozygous [D68.8]      Anticoagulation Episode Summary    [facility]   Preferred lab    Send INR reminders to    Comments       PATIENT INSTRUCTIONS: Patient Instructions  Patient instructed to take medications as defined in the Anti-coagulation Track section of this encounter.  Patient instructed to take today's dose.  Patient verbalized understanding of these instructions.       FOLLOW-UP Return in 3 weeks (on 11/25/2014) for Follow up INR at 1015h.  [name], III Pharm.D., CACP

## 2014-11-07 ENCOUNTER — Other Ambulatory Visit: Payer: Self-pay | Admitting: Internal Medicine

## 2014-11-07 DIAGNOSIS — M1712 Unilateral primary osteoarthritis, left knee: Secondary | ICD-10-CM

## 2014-11-07 LAB — PRESCRIPTION ABUSE MONITORING 15P, URINE
AMPHETAMINE/METH: NEGATIVE ng/mL
BARBITURATE SCREEN, URINE: NEGATIVE ng/mL
Benzodiazepine Screen, Urine: NEGATIVE ng/mL
Buprenorphine, Urine: NEGATIVE ng/mL
CREATININE, URINE: 22.79 mg/dL (ref >20.0)
Carisoprodol, Urine: NEGATIVE ng/mL
Cocaine Metabolites: NEGATIVE ng/mL
FENTANYL URINE: NEGATIVE ng/mL
MEPERIDINE UR: NEGATIVE ng/mL
METHADONE SCREEN, URINE: NEGATIVE ng/mL
Opiate Screen, Urine: NEGATIVE ng/mL
Oxycodone Screen, Ur: NEGATIVE ng/mL
Propoxyphene: NEGATIVE ng/mL
TRAMADOL UR: NEGATIVE ng/mL
ZOLPIDEM, URINE: NEGATIVE ng/mL

## 2014-11-07 MED ORDER — HYDROCODONE-ACETAMINOPHEN 5-325 MG PO TABS: 1.0000 | ORAL_TABLET | Freq: Four times a day (QID) | ORAL | Status: DC | PRN

## 2014-11-09 LAB — CANNABANOIDS (GC/LC/MS), URINE: THC-COOH UR CONFIRM: 103 ng/mL — AB (ref <5)

## 2014-11-25 ENCOUNTER — Ambulatory Visit (INDEPENDENT_AMBULATORY_CARE_PROVIDER_SITE_OTHER): Payer: Self-pay | Admitting: Pharmacist

## 2014-11-25 ENCOUNTER — Ambulatory Visit (INDEPENDENT_AMBULATORY_CARE_PROVIDER_SITE_OTHER): Payer: Self-pay | Admitting: Internal Medicine

## 2014-11-25 ENCOUNTER — Encounter: Payer: Self-pay | Admitting: Internal Medicine

## 2014-11-25 VITALS — BP 142/98 | HR 84 | Temp 97.9°F | Wt 355.1 lb

## 2014-11-25 DIAGNOSIS — D6851 Activated protein C resistance: Secondary | ICD-10-CM

## 2014-11-25 DIAGNOSIS — I129 Hypertensive chronic kidney disease with stage 1 through stage 4 chronic kidney disease, or unspecified chronic kidney disease: Secondary | ICD-10-CM

## 2014-11-25 DIAGNOSIS — D688 Other specified coagulation defects: Secondary | ICD-10-CM

## 2014-11-25 DIAGNOSIS — I2699 Other pulmonary embolism without acute cor pulmonale: Secondary | ICD-10-CM

## 2014-11-25 DIAGNOSIS — N182 Chronic kidney disease, stage 2 (mild): Secondary | ICD-10-CM

## 2014-11-25 DIAGNOSIS — I10 Essential (primary) hypertension: Secondary | ICD-10-CM

## 2014-11-25 DIAGNOSIS — M1712 Unilateral primary osteoarthritis, left knee: Secondary | ICD-10-CM

## 2014-11-25 DIAGNOSIS — Z7901 Long term (current) use of anticoagulants: Secondary | ICD-10-CM

## 2014-11-25 LAB — POCT INR: INR: 1.3

## 2014-11-25 LAB — BASIC METABOLIC PANEL WITH GFR
BUN: 14 mg/dL (ref 6–23)
CHLORIDE: 105 meq/L (ref 96–112)
CO2: 26 meq/L (ref 19–32)
Calcium: 9 mg/dL (ref 8.4–10.5)
Creat: 1.49 mg/dL — ABNORMAL HIGH (ref 0.50–1.35)
GFR, EST AFRICAN AMERICAN: 68 mL/min
GFR, Est Non African American: 59 mL/min — ABNORMAL LOW
GLUCOSE: 102 mg/dL — AB (ref 70–99)
POTASSIUM: 4.4 meq/L (ref 3.5–5.3)
Sodium: 140 mEq/L (ref 135–145)

## 2014-11-25 MED ORDER — HYDROCODONE-ACETAMINOPHEN 5-325 MG PO TABS: 1.0000 | ORAL_TABLET | Freq: Four times a day (QID) | ORAL | Status: DC | PRN

## 2014-11-25 MED ORDER — LISINOPRIL-HYDROCHLOROTHIAZIDE 20-25 MG PO TABS: 1.0000 | ORAL_TABLET | Freq: Every day | ORAL | Status: DC

## 2014-11-25 NOTE — Assessment & Plan Note (Signed)
-   On chronic narcotic therapy with pain medication contract with his PCP Dr. Marvel Plan.  He did have a negative UDS but gives a reasonable explanation for this.  I have given him a 1 month refill of his medications and he will need to see his PCP within that time to assess if the negative UDS constitutes a violation as we did not have it documented that he had taken the medication. - I did not check a UDS today and he reports running out of pain medication 2 days ago and he may or may not be positive.

## 2014-11-25 NOTE — Assessment & Plan Note (Signed)
Check BMP .  

## 2014-11-25 NOTE — Patient Instructions (Signed)
Patient instructed to take medications as defined in the Anti-coagulation Track section of this encounter.  Patient instructed to take today's dose.  Patient verbalized understanding of these instructions.    

## 2014-11-25 NOTE — Patient Instructions (Signed)
General Instructions: I will have you follow up with you primary care physician.  I am increasing your blood pressure pill.  Please bring your medicines with you each time you come to clinic.  Medicines may include prescription medications, over-the-counter medications, herbal remedies, eye drops, vitamins, or other pills.   Progress Toward Treatment Goals:  Treatment Goal 10/18/2013  Blood pressure improved    Self Care Goals & Plans:  Self Care Goal 10/09/2014  Manage my medications take my medicines as prescribed; bring my medications to every visit; refill my medications on time  Monitor my health -  Eat healthy foods eat more vegetables; eat foods that are low in salt; eat baked foods instead of fried foods  Be physically active take a walk every day  Stop smoking set a quit date and stop smoking  Prevent falls -    No flowsheet data found.   Care Management & Community Referrals:  Referral 10/18/2013  Referrals made for care management support none needed  Referrals made to community resources -

## 2014-11-25 NOTE — Progress Notes (Signed)
Case discussed with Dr. Hoffman soon after the resident saw the patient.  We reviewed the resident's history and exam and pertinent patient test results.  I agree with the assessment, diagnosis, and plan of care documented in the resident's note. 

## 2014-11-25 NOTE — Progress Notes (Signed)
Anti-Coagulation Progress Note  HERSEL MCMEEN is a 39 y.o. male who is currently on an anti-coagulation regimen.    RECENT RESULTS: Recent results are below, the most recent result is correlated with a dose of 220 mg. per week: Lab Results  Component Value Date   INR 1.30 11/25/2014   INR 1.70 11/06/2014   INR 1.10 10/28/2014    ANTI-COAG DOSE: Anticoagulation Dose Instructions as of 11/25/2014      [name] Tue Wed Thu Fri Sat   New Dose 30 mg 40 mg 30 mg 40 mg 30 mg 40 mg 30 mg    Description        As per doseresponse provided dosing instruction sheet.       ANTICOAG SUMMARY: Anticoagulation Episode Summary    Current INR goal 2.0-3.0  Next INR check 12/09/2014  INR from last check 1.30! (11/25/2014)  Weekly max dose   Target end date Indefinite  [facility]  Preferred lab   Send INR reminders to    Indications  Long term current use of anticoagulant therapy [Z79.01] Bilateral pulmonary embolism [I26.99] Factor 5 Leiden mutation heterozygous [D68.8]        Comments         ANTICOAG TODAY: Anticoagulation Summary as of 11/25/2014    INR goal 2.0-3.0  Selected INR 1.30! (11/25/2014)  Next INR check 12/09/2014  Target end date Indefinite   Indications  Long term current use of anticoagulant therapy [Z79.01] Bilateral pulmonary embolism [I26.99] Factor 5 Leiden mutation heterozygous [D68.8]      Anticoagulation Episode Summary    [facility] check [facility]   Preferred lab    Send INR reminders to    Comments       PATIENT INSTRUCTIONS: Patient Instructions  Patient instructed to take medications as defined in the Anti-coagulation Track section of this encounter.  Patient instructed to take today's dose.  Patient verbalized understanding of these instructions.       FOLLOW-UP Return in 2 weeks (on 12/09/2014) for Follow up INR at 1030h.  [name], III Pharm.D., CACP
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
## 2014-11-25 NOTE — Assessment & Plan Note (Addendum)
BP Readings from Last 3 Encounters:  11/25/14 142/98  10/12/14 162/100  10/09/14 144/95    Lab Results  Component Value Date   NA 140 11/25/2014   K 4.4 11/25/2014   CREATININE 1.49* 11/25/2014    Assessment: Blood pressure control: moderately elevated Progress toward BP goal:  unchanged Comments: BP remains uncontrolled  Plan: Medications:  Increase lisinopril HCTZ to 20-25mg  daily Educational resources provided:   Self management tools provided:   Other plans: Return in 1 month with PCP, who can check a BMP at that time.  I his lisinopril may also need to be increased in the future but I doubt that that change alone today will get him to goal. Check BMP today.

## 2014-11-26 ENCOUNTER — Telehealth: Payer: Self-pay | Admitting: Internal Medicine

## 2014-11-26 NOTE — Telephone Encounter (Signed)
Called by pt he is on Coumadin and dose just adjusted took 4 pills yesterday around 3 PM.  He worked out this am and noticed when he got out of the shower the water in the shower was pink and he had blood in his clothes and they were wet with blood.  This is 1st time he has had rectal bleeding denies hemorrhoids, hematuria, dizziness, lightheadedness.  He has been on Coumadin for 2-3 years. Rec. Pt come to the ED but he stated he works for himself and has a job lined up today but will see what he can do.  Advised coming to the ED will likely be faster than being worked into clinic.  He is going to try to come to the ED.    Aundra Dubin MD

## 2014-12-05 ENCOUNTER — Telehealth: Payer: Self-pay | Admitting: Pharmacist

## 2014-12-05 NOTE — Telephone Encounter (Signed)
Call to patient to confirm appointment for 12/09/14 at 10:30 no answer

## 2014-12-09 ENCOUNTER — Ambulatory Visit (INDEPENDENT_AMBULATORY_CARE_PROVIDER_SITE_OTHER): Payer: Self-pay | Admitting: Pharmacist

## 2014-12-09 ENCOUNTER — Ambulatory Visit
Admission: RE | Admit: 2014-12-09 | Discharge: 2014-12-09 | Disposition: A | Discharge status: Home / Self Care | Payer: No Typology Code available for payment source | Source: Ambulatory Visit | Attending: Sports Medicine | Admitting: Sports Medicine

## 2014-12-09 ENCOUNTER — Ambulatory Visit (INDEPENDENT_AMBULATORY_CARE_PROVIDER_SITE_OTHER): Payer: Self-pay | Admitting: Sports Medicine

## 2014-12-09 ENCOUNTER — Encounter: Payer: Self-pay | Admitting: Sports Medicine

## 2014-12-09 VITALS — BP 158/96 | Ht 74.0 in | Wt 332.0 lb

## 2014-12-09 DIAGNOSIS — I2699 Other pulmonary embolism without acute cor pulmonale: Secondary | ICD-10-CM

## 2014-12-09 DIAGNOSIS — D6851 Activated protein C resistance: Secondary | ICD-10-CM

## 2014-12-09 DIAGNOSIS — M1712 Unilateral primary osteoarthritis, left knee: Secondary | ICD-10-CM

## 2014-12-09 DIAGNOSIS — D688 Other specified coagulation defects: Secondary | ICD-10-CM

## 2014-12-09 DIAGNOSIS — Z7901 Long term (current) use of anticoagulants: Secondary | ICD-10-CM

## 2014-12-09 LAB — POCT INR: INR: 1.5

## 2014-12-09 MED ORDER — METHYLPREDNISOLONE ACETATE 40 MG/ML IJ SUSP
40.0000 mg | Freq: Once | INTRAMUSCULAR | Status: AC
Start: 2014-12-09 — End: 2014-12-09
  Administered 2014-12-09: 40 mg via INTRA_ARTICULAR

## 2014-12-09 NOTE — Patient Instructions (Signed)
Patient instructed to take medications as defined in the Anti-coagulation Track section of this encounter.  Patient instructed to take today's dose.  Patient verbalized understanding of these instructions.    

## 2014-12-09 NOTE — Progress Notes (Signed)
Anti-Coagulation Progress Note  Donald Brady is a 39 y.o. male who is currently on an anti-coagulation regimen.    RECENT RESULTS: Recent results are below, the most recent result is correlated with a dose of 240 mg. per week--will increase to 270mg /wk an approximate 15% increase as is per protocol.   Lab Results  Component Value Date   INR 1.50 12/09/2014   INR 1.30 11/25/2014   INR 1.70 11/06/2014    ANTI-COAG DOSE: Anticoagulation Dose Instructions as of 12/09/2014      [name] Tue Wed Thu Fri Sat   New Dose 30 mg 40 mg 40 mg 40 mg 40 mg 40 mg 40 mg    Description        As per doseresponse provided dosing instruction sheet.       ANTICOAG SUMMARY: Anticoagulation Episode Summary    Current INR goal 2.0-3.0  Next INR check 12/23/2014  INR from last check 1.50! (12/09/2014)  Weekly max dose   Target end date Indefinite  [facility]  Preferred lab   Send INR reminders to    Indications  Long term current use of anticoagulant therapy [Z79.01] Bilateral pulmonary embolism [I26.99] Factor 5 Leiden mutation heterozygous [D68.8]        Comments         ANTICOAG TODAY: Anticoagulation Summary as of 12/09/2014    INR goal 2.0-3.0  Selected INR 1.50! (12/09/2014)  Next INR check 12/23/2014  Target end date Indefinite   Indications  Long term current use of anticoagulant therapy [Z79.01] Bilateral pulmonary embolism [I26.99] Factor 5 Leiden mutation heterozygous [D68.8]      Anticoagulation Episode Summary    [facility]   Preferred lab    Send INR reminders to    Comments       PATIENT INSTRUCTIONS: Patient Instructions  Patient instructed to take medications as defined in the Anti-coagulation Track section of this encounter.  Patient instructed to take today's dose.  Patient verbalized understanding of these instructions.       FOLLOW-UP Return in 2 weeks (on 12/23/2014) for Follow up INR at 1045h.  [name], III Pharm.D., CACP

## 2014-12-09 NOTE — Progress Notes (Signed)
Indication: Pulmonary embolism with possible chronic component Duration: Lifelong recommended by Pulmonary because of probable chronic component INR: Below target. Dr. Gladstone Pih assessment and plan were reviewed and I agree with his documentation.

## 2014-12-09 NOTE — Progress Notes (Signed)
   Subjective:    Patient ID: Donald Brady, male    DOB: March 16, 1976, 39 y.o.   MRN: [id number]  HPI   Patient comes in today with returning left knee pain. He has a history of lateral compartmental DJD in this knee. He recently had x-rays done earlier this month. Those films are available for review. The films appear to be weightbearing but when I compare them to the x-rays of the same knee from December 2014 there does not appear to be as much lateral compartmental DJD on those films as there was in 2014. Last cortisone injection was 3 months ago. Pain has started to return. He is getting swelling as well as locking and catching in his knee. No recent trauma. Symptoms are identical to what he's experienced previously.    Review of Systems     Objective:   Physical Exam Obese. No acute distress.  Left knee: Range of motion 0-120 degrees. Trace effusion. Tenderness to palpation along the lateral joint linee with pain but no popping with McMurray's. Good ligamentous stability. Neurovascular intact distally.    Assessment & Plan:   Returning left knee pain secondary to DJD versus degenerative meniscal tear   It is interesting that his most recent set of x-rays show a better looking lateral compartment than the x-rays from December 2014. Therefore, I want to repeat standing x-rays including a 30 flexion view at [facility] imaging. I will call him with those results once available. If advanced lateral compartmental DJD is once again appreciated then we will continue to treat this as an arthritic knee. However, if there is some appreciable joint space left then I would consider an MRI to rule out a degenerative meniscal tear or loose bodies which may benefit from arthroscopy. I've agreed to reinject his knee today. An anterior lateral approach was utilized after risks and benefits were explained to the patient including the risk of hemarthrosis given his chronic Coumadin use.

## 2014-12-13 ENCOUNTER — Telehealth: Payer: Self-pay | Admitting: Sports Medicine

## 2014-12-13 ENCOUNTER — Other Ambulatory Visit: Payer: Self-pay | Admitting: *Deleted

## 2014-12-13 DIAGNOSIS — M1712 Unilateral primary osteoarthritis, left knee: Secondary | ICD-10-CM

## 2014-12-13 NOTE — Telephone Encounter (Signed)
Spoke with the patient on the phone today regarding x-rays of his left knee. He does have moderately advanced lateral compartmental DJD but there is a slight amount of appreciable joint space. In addition to pain and swelling the patient is also complaining of mechanical symptoms including catching and locking in his knee. Therefore, I would like to get an MRI of his knee to see if there are any loose bodies or significant meniscal damage that may benefit from a simple arthroscopy. Obviously definitive treatment for his knee is a total knee arthroplasty but his young age and obesity precluded him from proceeding with this right now. I will call him after I reviewed the MRI of his left knee at which point we will decide whether or not referral to orthopedics is reasonable.

## 2014-12-23 ENCOUNTER — Other Ambulatory Visit: Payer: Self-pay | Admitting: *Deleted

## 2014-12-23 ENCOUNTER — Ambulatory Visit (INDEPENDENT_AMBULATORY_CARE_PROVIDER_SITE_OTHER): Payer: No Typology Code available for payment source | Admitting: Pharmacist

## 2014-12-23 DIAGNOSIS — F1490 Cocaine use, unspecified, uncomplicated: Secondary | ICD-10-CM | POA: Insufficient documentation

## 2014-12-23 DIAGNOSIS — M1712 Unilateral primary osteoarthritis, left knee: Secondary | ICD-10-CM

## 2014-12-23 DIAGNOSIS — D688 Other specified coagulation defects: Secondary | ICD-10-CM

## 2014-12-23 DIAGNOSIS — Z7901 Long term (current) use of anticoagulants: Secondary | ICD-10-CM

## 2014-12-23 DIAGNOSIS — I2699 Other pulmonary embolism without acute cor pulmonale: Secondary | ICD-10-CM

## 2014-12-23 DIAGNOSIS — D6851 Activated protein C resistance: Secondary | ICD-10-CM

## 2014-12-23 DIAGNOSIS — F411 Generalized anxiety disorder: Secondary | ICD-10-CM

## 2014-12-23 LAB — POCT INR: INR: 2.4

## 2014-12-23 MED ORDER — CITALOPRAM HYDROBROMIDE 20 MG PO TABS
20.0000 mg | ORAL_TABLET | Freq: Every day | ORAL | Status: DC
Start: 2014-12-23 — End: 2015-05-05

## 2014-12-23 MED ORDER — WARFARIN SODIUM 10 MG PO TABS: 20.0000 mg | ORAL_TABLET | Freq: Every day | ORAL | Status: DC

## 2014-12-23 NOTE — Patient Instructions (Signed)
Patient instructed to take medications as defined in the Anti-coagulation Track section of this encounter.  Patient instructed to take today's dose.  Patient verbalized understanding of these instructions.    

## 2014-12-23 NOTE — Progress Notes (Signed)
Anti-Coagulation Progress Note  Donald Brady is a 39 y.o. male who is currently on an anti-coagulation regimen.    RECENT RESULTS: Recent results are below, the most recent result is correlated with a dose of 270 mg. per week: Lab Results  Component Value Date   INR 2.40 12/23/2014   INR 1.50 12/09/2014   INR 1.30 11/25/2014    ANTI-COAG DOSE: Anticoagulation Dose Instructions as of 12/23/2014      [name] Tue Wed Thu Fri Sat   New Dose 30 mg 40 mg 40 mg 40 mg 40 mg 40 mg 40 mg    Description        As per doseresponse provided dosing instruction sheet.       ANTICOAG SUMMARY: Anticoagulation Episode Summary    Current INR goal 2.0-3.0  Next INR check 12/30/2014  INR from last check 2.40 (12/23/2014)  Weekly max dose   Target end date Indefinite  [facility]  Preferred lab   Send INR reminders to    Indications  Long term current use of anticoagulant therapy [Z79.01] Bilateral pulmonary embolism [I26.99] Factor 5 Leiden mutation heterozygous [D68.8]        Comments         ANTICOAG TODAY: Anticoagulation Summary as of 12/23/2014    INR goal 2.0-3.0  Selected INR 2.40 (12/23/2014)  Next INR check 12/30/2014  Target end date Indefinite   Indications  Long term current use of anticoagulant therapy [Z79.01] Bilateral pulmonary embolism [I26.99] Factor 5 Leiden mutation heterozygous [D68.8]      Anticoagulation Episode Summary    [facility]   Preferred lab    Send INR reminders to    Comments       PATIENT INSTRUCTIONS: Patient Instructions  Patient instructed to take medications as defined in the Anti-coagulation Track section of this encounter.  Patient instructed to take today's dose.  Patient verbalized understanding of these instructions.       FOLLOW-UP Return in 7 days (on 12/30/2014) for Follow up INR at 0845h.  [name], III Pharm.D., CACP

## 2014-12-23 NOTE — Progress Notes (Signed)
INTERNAL MEDICINE TEACHING ATTENDING ADDENDUM - Aldine Contes M.D  Duration- indefinite, Indication- bilateral PE, INR- therapeutic. Agree with pharmacy recommendations as outlined in their note.

## 2014-12-24 ENCOUNTER — Other Ambulatory Visit: Payer: Self-pay | Admitting: *Deleted

## 2014-12-24 DIAGNOSIS — M1712 Unilateral primary osteoarthritis, left knee: Secondary | ICD-10-CM

## 2014-12-24 MED ORDER — HYDROCODONE-ACETAMINOPHEN 5-325 MG PO TABS: 1.0000 | ORAL_TABLET | Freq: Four times a day (QID) | ORAL | Status: DC | PRN

## 2014-12-30 ENCOUNTER — Ambulatory Visit
Admission: RE | Admit: 2014-12-30 | Discharge: 2014-12-30 | Disposition: A | Discharge status: Home / Self Care | Payer: No Typology Code available for payment source | Source: Ambulatory Visit | Attending: Sports Medicine | Admitting: Sports Medicine

## 2014-12-30 ENCOUNTER — Ambulatory Visit (INDEPENDENT_AMBULATORY_CARE_PROVIDER_SITE_OTHER): Payer: Self-pay | Admitting: Pharmacist

## 2014-12-30 DIAGNOSIS — M1712 Unilateral primary osteoarthritis, left knee: Secondary | ICD-10-CM

## 2014-12-30 DIAGNOSIS — D6851 Activated protein C resistance: Secondary | ICD-10-CM

## 2014-12-30 DIAGNOSIS — D688 Other specified coagulation defects: Secondary | ICD-10-CM

## 2014-12-30 DIAGNOSIS — Z7901 Long term (current) use of anticoagulants: Secondary | ICD-10-CM

## 2014-12-30 DIAGNOSIS — I2699 Other pulmonary embolism without acute cor pulmonale: Secondary | ICD-10-CM

## 2014-12-30 LAB — POCT INR: INR: 2.3

## 2014-12-30 NOTE — Patient Instructions (Signed)
Patient instructed to take medications as defined in the Anti-coagulation Track section of this encounter.  Patient instructed to take today's dose.  Patient verbalized understanding of these instructions.    

## 2014-12-30 NOTE — Progress Notes (Signed)
Anti-Coagulation Progress Note  Donald Brady is a 39 y.o. male who is currently on an anti-coagulation regimen.    RECENT RESULTS: Recent results are below, the most recent result is correlated with a dose of 270 mg. per week: Lab Results  Component Value Date   INR 2.30 12/30/2014   INR 2.40 12/23/2014   INR 1.50 12/09/2014    ANTI-COAG DOSE: Anticoagulation Dose Instructions as of 12/30/2014      [name] Tue Wed Thu Fri Sat   New Dose 40 mg 40 mg 40 mg 40 mg 40 mg 40 mg 40 mg    Description        As per doseresponse provided dosing instruction sheet.       ANTICOAG SUMMARY: Anticoagulation Episode Summary    Current INR goal 2.0-3.0  Next INR check 01/06/2015  INR from last check 2.30 (12/30/2014)  Weekly max dose   Target end date Indefinite  [facility]  Preferred lab   Send INR reminders to    Indications  Long term current use of anticoagulant therapy [Z79.01] Bilateral pulmonary embolism [I26.99] Factor 5 Leiden mutation heterozygous [D68.8]        Comments         ANTICOAG TODAY: Anticoagulation Summary as of 12/30/2014    INR goal 2.0-3.0  Selected INR 2.30 (12/30/2014)  Next INR check 01/06/2015  Target end date Indefinite   Indications  Long term current use of anticoagulant therapy [Z79.01] Bilateral pulmonary embolism [I26.99] Factor 5 Leiden mutation heterozygous [D68.8]      Anticoagulation Episode Summary    [facility]   Preferred lab    Send INR reminders to    Comments       PATIENT INSTRUCTIONS: Patient Instructions  Patient instructed to take medications as defined in the Anti-coagulation Track section of this encounter.  Patient instructed to take today's dose.  Patient verbalized understanding of these instructions.       FOLLOW-UP Return in 7 days (on 01/06/2015) for Follow up INR at 0845h.  [name], III Pharm.D., CACP

## 2015-01-01 NOTE — Progress Notes (Signed)
I have reviewed Dr. Gladstone Pih note.  Patient is on anticoagulation for unprovoked PE.

## 2015-01-06 ENCOUNTER — Ambulatory Visit: Payer: No Typology Code available for payment source

## 2015-01-15 ENCOUNTER — Ambulatory Visit
Admission: RE | Admit: 2015-01-15 | Discharge: 2015-01-15 | Disposition: A | Discharge status: Home / Self Care | Payer: No Typology Code available for payment source | Source: Ambulatory Visit | Attending: Sports Medicine | Admitting: Sports Medicine

## 2015-01-15 DIAGNOSIS — S83289A Other tear of lateral meniscus, current injury, unspecified knee, initial encounter: Secondary | ICD-10-CM

## 2015-01-15 HISTORY — DX: Other tear of lateral meniscus, current injury, unspecified knee, initial encounter: S83.289A

## 2015-01-17 ENCOUNTER — Telehealth: Payer: Self-pay | Admitting: Sports Medicine

## 2015-01-17 DIAGNOSIS — S83282D Other tear of lateral meniscus, current injury, left knee, subsequent encounter: Secondary | ICD-10-CM

## 2015-01-17 NOTE — Telephone Encounter (Signed)
[facility] Dr Erlinda Hong Today, June 3rd at [address], [place], [place] [address] [phone]

## 2015-01-17 NOTE — Telephone Encounter (Signed)
I spoke with the patient on the phone today after reviewing the MRI of his left knee. Patient has a large bucket-handle tear of the lateral meniscus which is displaced into the intercondylar notch. This is in the setting of severe lateral compartment osteoarthritis. Given his young age I would like to refer him to [facility] to discuss possible knee arthroscopy for meniscectomy. The patient does understand that this will not cure his osteoarthritis and that eventually he will need a total knee arthroplasty. I would like to refer him either to Dr. Marlou Sa or Dr.Xu. I will defer further treatment to the discretion.

## 2015-01-17 NOTE — Addendum Note (Signed)
Addended by: Cyd Silence on: 01/17/2015 11:17 AM   Modules accepted: Orders

## 2015-01-20 ENCOUNTER — Encounter: Payer: Self-pay | Admitting: Internal Medicine

## 2015-01-20 ENCOUNTER — Ambulatory Visit (INDEPENDENT_AMBULATORY_CARE_PROVIDER_SITE_OTHER): Payer: No Typology Code available for payment source | Admitting: Pharmacist

## 2015-01-20 ENCOUNTER — Ambulatory Visit (INDEPENDENT_AMBULATORY_CARE_PROVIDER_SITE_OTHER): Payer: No Typology Code available for payment source | Admitting: Internal Medicine

## 2015-01-20 ENCOUNTER — Encounter (HOSPITAL_BASED_OUTPATIENT_CLINIC_OR_DEPARTMENT_OTHER): Payer: Self-pay | Admitting: *Deleted

## 2015-01-20 VITALS — BP 137/94 | HR 87 | Temp 98.1°F | Ht 74.0 in | Wt 356.5 lb

## 2015-01-20 DIAGNOSIS — Z7901 Long term (current) use of anticoagulants: Secondary | ICD-10-CM

## 2015-01-20 DIAGNOSIS — D6851 Activated protein C resistance: Secondary | ICD-10-CM

## 2015-01-20 DIAGNOSIS — Z01818 Encounter for other preprocedural examination: Secondary | ICD-10-CM

## 2015-01-20 DIAGNOSIS — D688 Other specified coagulation defects: Secondary | ICD-10-CM

## 2015-01-20 DIAGNOSIS — I2699 Other pulmonary embolism without acute cor pulmonale: Secondary | ICD-10-CM

## 2015-01-20 DIAGNOSIS — I10 Essential (primary) hypertension: Secondary | ICD-10-CM

## 2015-01-20 LAB — POCT INR: INR: 1.1

## 2015-01-20 NOTE — Pre-Procedure Instructions (Signed)
To come for BMET, EKG, PT/INR

## 2015-01-20 NOTE — Progress Notes (Signed)
   01/20/15 1156  OBSTRUCTIVE SLEEP APNEA  Have you ever been diagnosed with sleep apnea through a sleep study? Yes  If yes, do you have and use a CPAP or BPAP machine every night? 0  Do you snore loudly (loud enough to be heard through closed doors)?  0  Do you often feel tired, fatigued, or sleepy during the daytime? 1  Has anyone observed you stop breathing during your sleep? 1 (states not in over 1 year)  Do you have, or are you being treated for high blood pressure? 1  BMI more than 35 kg/m2? 1  Age over 58 years old? 0  Gender: 1

## 2015-01-20 NOTE — Progress Notes (Signed)
Subjective:   Patient ID: Donald Brady male   DOB: 12-Aug-1976 39 y.o.   MRN: [id number]  HPI: [name] is a 39 y.o. male w/ PMHx of HTN, HLD, GERD, Asthma, OSA, h/o bilateral PE on Coumadin, and anxiety, presents to the clinic today for a visit for preoperative clearance for a left knee meniscal tear repair. 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 Also showed severe lateral compartment osteoarthritis. Patient is scheduled for repair on 01/24/15 w/ Dr. Erlinda Hong. Patient has no significant complaints today. He denies any chest pain, SOB, palpitations, dizziness, lightheadedness, fever, chills, nausea, or vomiting. The patient has been on chronic Coumadin for previous h/o PE in 2013 in the setting of Factor V Leiden (heterzygous). He has not taken this for 1 week. He saw Dr. Elie Confer today who has started Lovenox bridge to take leading up to his surgery.   Past Medical History  Diagnosis Date  . Anxiety   . HLD (hyperlipidemia)   . History of seizures     no known cause, per pt. - has been "years" since last seizure; no longer on anticonvulsant  . Arthritis     left knee  . GERD (gastroesophageal reflux disease)   . Hypertension     states BP "runs high"; has been on med. "a while"  . Sleep apnea     had sleep study 10/2013:  "severe" sleep apnea, states could not afford CPAP machine  . History of pulmonary embolus (PE)     stopped Coumadin 01/13/2015; started Lovenox 01/20/2015  . Asthma     no inhaler use in 1 year  . Lateral meniscus tear 01/2015    left knee   Current Outpatient Prescriptions  Medication Sig Dispense Refill  . citalopram (CELEXA) 20 MG tablet Take 1 tablet (20 mg total) by mouth daily. 90 tablet 3  . HYDROcodone-acetaminophen (NORCO) 5-325 MG per tablet Take 1-2 tablets by mouth every 6 (six) hours as needed for moderate pain. 180 tablet 0  .  lisinopril-hydrochlorothiazide (PRINZIDE,ZESTORETIC) 20-25 MG per tablet Take 1 tablet by mouth daily. 30 tablet 11  . Multiple Vitamins-Minerals (MULTIVITAMINS THER. W/MINERALS) TABS Take 1 tablet by mouth daily.    . ranitidine (ZANTAC) 150 MG tablet Take 1 tablet (150 mg total) by mouth 2 (two) times daily. 60 tablet 11  . warfarin (COUMADIN) 10 MG tablet Take 40 mg by mouth daily.     No current facility-administered medications for this visit.   Review of Systems  General: Denies fever, diaphoresis, appetite change, and fatigue.  Respiratory: Denies SOB, cough, and wheezing.   Cardiovascular: Denies chest pain and palpitations.  Gastrointestinal: Denies nausea, vomiting, abdominal pain, and diarrhea Musculoskeletal: Positive for left knee pain. Denies myalgias, back pain, and gait problem.  Neurological: Denies dizziness, syncope, weakness, lightheadedness, and headaches.  Psychiatric/Behavioral: Denies mood changes, sleep disturbance, and agitation.   Objective:   Physical Exam: Filed Vitals:   01/20/15 1338 01/20/15 1410  BP: 137/102 137/94  Pulse: 92 87  Temp: 98.1 F (36.7 C)   TempSrc: Oral   Height: 6\' 2"  (1.88 m)   Weight: 356 lb 8 oz (161.707 kg)   SpO2: 100%     General: Obese AA male, Alert, cooperative, NAD. HEENT: PERRL, EOMI. Moist mucus membranes Neck: Full range of motion without pain, supple, no lymphadenopathy or carotid bruits Lungs: Clear to ascultation bilaterally, normal work  of respiration, no wheezes, rales, rhonchi Heart: RRR, no murmurs, gallops, or rubs Abdomen: Soft, non-tender, non-distended, BS + Extremities: No cyanosis, clubbing, or edema Neurologic: Alert & oriented x3, cranial nerves II-XII intact, strength grossly intact, sensation intact to light touch   Assessment & Plan:   Please see problem based assessment and plan.

## 2015-01-20 NOTE — Assessment & Plan Note (Signed)
BP Readings from Last 3 Encounters:  01/20/15 137/94  12/09/14 158/96  11/25/14 142/98    Lab Results  Component Value Date   NA 140 11/25/2014   K 4.4 11/25/2014   CREATININE 1.49* 11/25/2014    Assessment: Comments: BP mildly elevated initially, improved after some time in the clinic. Takes Lisinopril-HCTZ 20-25 for this.   Plan: Medications:  continue current medications; could consider increase in Lisinopril to 40 mg daily if BP continues to be periodically elevated.  Educational resources provided: brochure (denies) Self management tools provided:   Other plans: None

## 2015-01-20 NOTE — Patient Instructions (Signed)
Patient has been provided 10 syringes of LMWH enoxaparin 1mg /kg SQ q12h. He began this while in the clinic room today. He is instructed to continue up until 24h PRIOR to his planned procedure on Friday, i.e. His 10:00AM dose of Lovenox on Thursday 9-JUN-16 will be his LAST dose prior to procedure. Patient was to see a PCP today for medical screening and clearance for surgery. He will resume Lovenox 24-72h AFTER the procedure and resume warfarin (as instructed by his orthopaedist) as per his last dosing instructions (40mg  [as 4x10mg  strength warfarin tablets) PO daily WITH Lovenox until Monday 13-JUN-16 at which time he will see me.   Patient instructed to take medications as defined in the Anti-coagulation Track section of this encounter.  Patient instructed to OMIT today's dose and all warfarin dosing until AFTER his orthopaedic surgery to be performed on Friday 10-JUN-16. Patient verbalized understanding of these instructions.

## 2015-01-20 NOTE — Assessment & Plan Note (Signed)
2013; Started on Coumadin initially, switched to Xarelto for a short period, now back on Coumadin as he could not afford NOAC. Unprovoked PE in the setting of Factor V Leiden (heterozygous), to be on indefinite anti-coagulation. Has not taken Coumadin since 01/13/15 given upcoming surgery, started on Lovenox bridge today.  -Continue Lovenox for now, hold day prior to surgery.  -Patient will return soon after surgery to resume Coumadin.

## 2015-01-20 NOTE — Assessment & Plan Note (Addendum)
Patient scheduled for knee arthroscopic surgery, on Coumadin. The patient is of moderate thromboembolic risk therefore does not require bridging agent, but was started on Lovenox bid today to continue until day prior to surgery. Patient denies SOB, chest pain, palpitations, dizziness, lightheadedness, LE swelling, PND or orthopnea. Patient is otherwise clear for surgery from a cardiac and medical standpoint. Previous labs stable (Cr, Hb at baseline). -Hold Lovenox 24 hours prior to surgery; return to Coumadin clinic in 1 week.  -Patient to continue Lovenox 24 hours after procedure and also resume Coumadin at that time.

## 2015-01-20 NOTE — Progress Notes (Signed)
Anti-Coagulation Progress Note  Donald Brady is a 39 y.o. male who is currently on an anti-coagulation regimen.    RECENT RESULTS: Recent results are below, the most recent result is correlated with a dose of ZERO mg. per week:  Patient has begun LMWH bridge with 1mg /kg SQ q12h of enoxaparin which was provided to the patient. Patient had discontinued his warfarin last Monday (citing that he had 'run-out', and "knowing I had to come off warfarin for my procedure--I did not call the clinic for a refill"). Patient was counseled to always call clinic and provide [place] with these details. Patient has been provided a plan of action for bridging between now and planned procedure. Currently OFF warfarin; has commenced LMWH (provided to him); will continue LMWH up until 24h prior to planned procedure (Last dose of LMWH will be 1000h dose on Thursday 9-JUN-16). Patient instructed to recommence warfarin and LMWH when advised by his orthopaedist--but this typically should be 24-72h based upon guidelines published in the supplement to the Journal CHEST.  Lab Results  Component Value Date   INR 1.10 01/20/2015   INR 2.30 12/30/2014   INR 2.40 12/23/2014    ANTI-COAG DOSE: Anticoagulation Dose Instructions as of 01/20/2015      [name] Tue Wed Thu Fri Sat   New Dose Hold Hold Hold Hold Hold Hold Hold    Description        Patient has been provided 10 syringes of LMWH enoxaparin 1mg /kg SQ q12h. He began this while in the clinic room today. He is instructed to continue up until 24h PRIOR to his planned procedure on Friday, i.e. His 10:00AM dose of Lovenox on Thursday 9-JUN-16 will be his LAST dose prior to procedure. Patient was to see a PCP today for medical screening and clearance for surgery. He will resume Lovenox 24-72h AFTER the procedure and resume warfarin (as instructed by his orthopaedist) as per his last dosing instructions (40mg  [as 4x10mg  strength warfarin tablets) PO daily WITH Lovenox until Monday  13-JUN-16 at which time he will see me.        ANTICOAG SUMMARY: Anticoagulation Episode Summary    Current INR goal 2.0-3.0  Next INR check 01/27/2015  INR from last check 1.10! (01/20/2015)  Weekly max dose   Target end date Indefinite  [facility]  Preferred lab   Send INR reminders to    Indications  Long term current use of anticoagulant therapy [Z79.01] Bilateral pulmonary embolism [I26.99] Factor 5 Leiden mutation heterozygous [D68.8]        Comments         ANTICOAG TODAY: Anticoagulation Summary as of 01/20/2015    INR goal 2.0-3.0  Selected INR 1.10! (01/20/2015)  Next INR check 01/27/2015  Target end date Indefinite   Indications  Long term current use of anticoagulant therapy [Z79.01] Bilateral pulmonary embolism [I26.99] Factor 5 Leiden mutation heterozygous [D68.8]      Anticoagulation Episode Summary    IN[facility]   Preferred lab    Send INR reminders to    Comments       PATIENT INSTRUCTIONS: Patient Instructions  Patient has been provided 10 syringes of LMWH enoxaparin 1mg /kg SQ q12h. He began this while in the clinic room today. He is instructed to continue up until 24h PRIOR to his planned procedure on Friday, i.e. His 10:00AM dose of Lovenox on Thursday 9-JUN-16 will be his LAST dose prior to procedure. Patient was to see a PCP  today for medical screening and clearance for surgery. He will resume Lovenox 24-72h AFTER the procedure and resume warfarin (as instructed by his orthopaedist) as per his last dosing instructions (40mg  [as 4x10mg  strength warfarin tablets) PO daily WITH Lovenox until Monday 13-JUN-16 at which time he will see me.   Patient instructed to take medications as defined in the Anti-coagulation Track section of this encounter.  Patient instructed to OMIT today's dose and all warfarin dosing until AFTER his orthopaedic surgery to be performed on Friday 10-JUN-16. Patient verbalized  understanding of these instructions.       FOLLOW-UP Return in 7 days (on 01/27/2015) for Follow up INR at 0900h.  [name], III Pharm.D., CACP

## 2015-01-21 NOTE — Progress Notes (Signed)
Internal Medicine Clinic Attending  Case discussed with Dr. Jones at the time of the visit.  We reviewed the resident's history and exam and pertinent patient test results.  I agree with the assessment, diagnosis, and plan of care documented in the resident's note.  

## 2015-01-23 ENCOUNTER — Telehealth: Payer: Self-pay | Admitting: *Deleted

## 2015-01-23 ENCOUNTER — Other Ambulatory Visit: Payer: Self-pay

## 2015-01-23 ENCOUNTER — Encounter (HOSPITAL_BASED_OUTPATIENT_CLINIC_OR_DEPARTMENT_OTHER)
Admission: RE | Admit: 2015-01-23 | Discharge: 2015-01-23 | Disposition: A | Discharge status: Home / Self Care | Payer: Self-pay | Source: Ambulatory Visit | Attending: Orthopaedic Surgery | Admitting: Orthopaedic Surgery

## 2015-01-23 ENCOUNTER — Encounter: Payer: Self-pay | Admitting: Internal Medicine

## 2015-01-23 ENCOUNTER — Ambulatory Visit (INDEPENDENT_AMBULATORY_CARE_PROVIDER_SITE_OTHER): Payer: Self-pay | Admitting: Internal Medicine

## 2015-01-23 ENCOUNTER — Other Ambulatory Visit (HOSPITAL_BASED_OUTPATIENT_CLINIC_OR_DEPARTMENT_OTHER): Payer: Self-pay | Admitting: Orthopaedic Surgery

## 2015-01-23 ENCOUNTER — Encounter: Payer: Self-pay | Admitting: Cardiovascular Disease

## 2015-01-23 VITALS — BP 128/95 | HR 77 | Temp 97.8°F | Ht 74.0 in | Wt 351.0 lb

## 2015-01-23 DIAGNOSIS — D6851 Activated protein C resistance: Secondary | ICD-10-CM

## 2015-01-23 DIAGNOSIS — Z01818 Encounter for other preprocedural examination: Secondary | ICD-10-CM

## 2015-01-23 DIAGNOSIS — M1712 Unilateral primary osteoarthritis, left knee: Secondary | ICD-10-CM

## 2015-01-23 DIAGNOSIS — R9431 Abnormal electrocardiogram [ECG] [EKG]: Secondary | ICD-10-CM | POA: Insufficient documentation

## 2015-01-23 LAB — BASIC METABOLIC PANEL
ANION GAP: 11 (ref 5–15)
BUN: 16 mg/dL (ref 6–20)
CALCIUM: 8.9 mg/dL (ref 8.9–10.3)
CHLORIDE: 104 mmol/L (ref 101–111)
CO2: 22 mmol/L (ref 22–32)
Creatinine, Ser: 1.86 mg/dL — ABNORMAL HIGH (ref 0.61–1.24)
GFR calc Af Amer: 52 mL/min — ABNORMAL LOW (ref >60)
GFR, EST NON AFRICAN AMERICAN: 45 mL/min — AB (ref >60)
GLUCOSE: 112 mg/dL — AB (ref 65–99)
Potassium: 3.6 mmol/L (ref 3.5–5.1)
SODIUM: 137 mmol/L (ref 135–145)

## 2015-01-23 LAB — PROTIME-INR
INR: 1.02 (ref 0.00–1.49)
Prothrombin Time: 13.6 seconds (ref 11.6–15.2)

## 2015-01-23 MED ORDER — HYDROCODONE-ACETAMINOPHEN 5-325 MG PO TABS: 1.0000 | ORAL_TABLET | Freq: Four times a day (QID) | ORAL | Status: DC | PRN

## 2015-01-23 NOTE — Assessment & Plan Note (Signed)
Patient to take lovenox and coumadin until left knee arthroscopy date is determined (at which point he will stop coumadin 5 days prior to surgery and stop lovenox 24 hours prior to surgery).  - Given lovenox sample today - Lovenox administration reviewed with Dr. Julianne Rice team - Restart coumadin - Coumadin clinic appointment with INR on Monday

## 2015-01-23 NOTE — Progress Notes (Signed)
Seen by Dr Al Corpus for anesthesia consult.  Changes found on ekg from last march 2015.  Dr Al Corpus needs pt seen by cardiology before able to have surgery.  Dr Crew explained to pt and pt voiced understanding and agreement.  [facility] spoke with Jackelyn Poling (nurse) who will have pt seen and arrange cardiology appointment.  Will keep me updated and I will call Dr Erlinda Hong when decision made on clear for surgery or not.

## 2015-01-23 NOTE — Progress Notes (Signed)
[facility] Subjective:   Patient ID: Donald Brady male   DOB: September 18, 1975 39 y.o.   MRN: [id number]  HPI: [name] is a 39 y.o. male with a history of bilateral PE 2013 on coumadin (factor V Leiden heterozygote), hypertension, hyperlipidemia, untreated obstructive sleep apnea, CKDII, COPD, asthma and left knee osteoarthritis who presents from his outpatient anesthesia pre-surgical appointment for EKG abnormalities prior to his left knee arthroscopy (scheduled for tomorrow, 6/10).  He was cleared medically and from a cardiac perspective for this procedure during his [facility] appointment 2 days ago. He had run out of his coumadin 1 week prior to that appointment. At that time, he was kept off of coumadin and started on Lovenox BID that was to be taken until today (the day prior to his surgery); he was then to resume Lovenox 24 hours after the arthroscopy along with coumadin, with an appointment with Dr. Elie Confer the following week for an INR check.  Today, in a pre-op anesthesia visit, his EKG showed T-wave inversions in I, aVL, V5 and V6. The patient has had no chest pain and no shortness of breath. 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 He has used cocaine in the past, but states that last use was over 2 weeks ago.   Past Medical History  Diagnosis Date  . Anxiety   . HLD (hyperlipidemia)   . History of seizures     no known cause, per pt. - has been "years" since last seizure; no longer on anticonvulsant  . Arthritis     left knee  . GERD (gastroesophageal reflux disease)   . Hypertension     states BP "runs high"; has been on med. "a while"  . Sleep apnea     had sleep study 10/2013:  "severe" sleep apnea, states could not afford CPAP machine  . History of pulmonary embolus (PE)     stopped Coumadin 01/13/2015; started Lovenox 01/20/2015  . Asthma     no inhaler use in 1 year  .  Lateral meniscus tear 01/2015    left knee   Current Outpatient Prescriptions  Medication Sig Dispense Refill  . enoxaparin (LOVENOX) 150 MG/ML injection Inject 1 mg/kg into the skin every 12 (twelve) hours.    . citalopram (CELEXA) 20 MG tablet Take 1 tablet (20 mg total) by mouth daily. 90 tablet 3  . HYDROcodone-acetaminophen (NORCO) 5-325 MG per tablet Take 1-2 tablets by mouth every 6 (six) hours as needed for moderate pain. 180 tablet 0  . lisinopril-hydrochlorothiazide (PRINZIDE,ZESTORETIC) 20-25 MG per tablet Take 1 tablet by mouth daily. 30 tablet 11  . Multiple Vitamins-Minerals (MULTIVITAMINS THER. W/MINERALS) TABS Take 1 tablet by mouth daily.    . ranitidine (ZANTAC) 150 MG tablet Take 1 tablet (150 mg total) by mouth 2 (two) times daily. 60 tablet 11  . warfarin (COUMADIN) 10 MG tablet Take 40 mg by mouth daily.     No current facility-administered medications for this visit.   Family History  Problem Relation Age of Onset  . Diabetes Mother   . Hypertension Mother   . Hearing loss Father     died in his 6's  . Diabetes Maternal Aunt   . Hypertension Maternal Aunt   . Diabetes Maternal Uncle   . Hypertension Maternal Uncle   . Cancer Father     unknown type  . Stomach cancer Maternal Uncle   .  Lung cancer Paternal Barbaraann Rondo     was a smoker  . Breast cancer Maternal Aunt   . Stomach cancer Maternal Uncle   . Clotting disorder Father   . Heart disease Father   . Liver disease Maternal Uncle     drinker  . Kidney disease Maternal Aunt    History   Social History  . Marital Status: Single    Spouse Name: N/A  . Number of Children: 1  . Years of Education: N/A   Occupational History  . Disabled- Architect   .  Other   Social History Main Topics  . Smoking status: Current Every Day Smoker -- 0.50 packs/day for 21 years    Types: Cigarettes  . Smokeless tobacco: Never Used     Comment: cutting back  . Alcohol Use: 0.0 oz/week    0 Standard drinks or  equivalent per week     Comment: 2 x/week:  2-3 beers each time  . Drug Use: Yes    Special: Marijuana  . Sexual Activity: Not on file   Other Topics Concern  . None   Social History Narrative   Lives in [place] with his dog.  Has a 37 year old daughter that he sees daily.  Has good relationship with daughter's mom.  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    Review of Systems: General: no recent illness HEENT: no headache or blurred vision Cardiac: no chest pain, no palpitations Respiratory: no shortness of breath GI: no changes Urinary: no changes Msk: no joint pain or swelling Psychiatric: history of anxiety and depression  Objective:  Physical Exam: Filed Vitals:   01/23/15 1012  BP: 128/95  Pulse: 77  Temp: 97.8 F (36.6 C)  TempSrc: Oral  Height: 6\' 2"  (1.88 m)  Weight: 351 lb (159.213 kg)  SpO2: 98%   Appearance: obese man in NAD HEENT: AT/Exline, PERRL, EOMi, no diaphoresis Heart: RRR, normal S1S2 Lungs: CTAB, no wheezes, normal work of breathing Abdomen: BS+, soft, nontender Musculoskeletal: normal range of motion, no edema Extremities: left knee painful to manipulation Neurologic: A&Ox3, grossly intact Skin: no rashes or lesions  Assessment & Plan:  Case discussed with Dr. Beryle Beams  Osteoarthritis of left knee Patient was scheduled for left knee arthroscopy tomorrow, 01/24/15. Unfortunately, this procedure will need to be cancelled, given his pre-operative EKG abnormalities.  - Postpone surgery; reschedule once patient is cleared by cardiology - Dr. Angelena Form reviewed the patient's EKG and wishes to see him in the next week; [place] to contact patient to schedule appointment (will likely need echo and stress test) - Patient given refill of his hydrocodone-acetaminophen 5-325 #180 for persistent left knee pain - Patient to restart coumadin today; will also continue to take lovenox until surgery is scheduled; will stop the coumadin  5 days before surgery date and will stop lovenox 24 hours prior to surgery  EKG abnormalities Patient was found to have new TWI on EKG during pre-operative anesthesia screen today in preparation for his left knee arthroscopy, scheduled for tomorrow. They are present in leads I, aVL, V5 and V6. No chest pain, no shortness of breath. Patient has a history of cocaine use, but has not used in several weeks. It is reassuring that the patient is asymptomatic, but he will likely need an echo and stress test prior to surgery given that his T-wave inversions are new since his EKG in 10/2014.  - Patient to be seen at [facility] cardiology in  the next week ([place] to call patient to schedule) - Postpone surgery  Preoperative clearance Patient had new TWI on EKG at anesthesia assessment today in pre-operative clinic.   - To be seen by [facility] cardiology in next week - Reschedule surgery after this assessment is complete and patient is cleared - Patient to take lovenox and coumadin until surgical date is determined (at which point he will stop coumadin 5 days prior to surgery and stop lovenox 24 hours prior to surgery); given lovenox sample today and administration reviewed with Dr. Julianne Rice team  Factor 5 Leiden mutation, heterozygous Patient to take lovenox and coumadin until left knee arthroscopy date is determined (at which point he will stop coumadin 5 days prior to surgery and stop lovenox 24 hours prior to surgery).  - Given lovenox sample today - Lovenox administration reviewed with Dr. Julianne Rice team - Restart coumadin - Coumadin clinic appointment with INR on Monday    Medications Ordered Meds ordered this encounter  Medications  . HYDROcodone-acetaminophen (NORCO) 5-325 MG per tablet    Sig: Take 1-2 tablets by mouth every 6 (six) hours as needed for moderate pain.    Dispense:  180 tablet    Refill:  0    Do not fill until 30 days after last Hydrocodone-Acetaminophen prescription was filled.  .  enoxaparin (LOVENOX) 150 MG/ML injection    Sig: Inject 1 mg/kg into the skin every 12 (twelve) hours.   Other Orders Orders Placed This Encounter  Procedures  . Ambulatory referral to Cardiology    Referral Priority:  Routine    Referral Type:  Consultation    Referral Reason:  Specialty Services Required    Requested Specialty:  Cardiology    Number of Visits Requested:  1

## 2015-01-23 NOTE — Progress Notes (Signed)
Spoke with Dr Sherrine Maples internal med doctor who saw him this am. Agrees needs cardiac work up.  Pt has appointment next week with [facility]. Spoke with Sherri from Dr Erlinda Hong  Explained situation will cancelled case for in the am, will reschedule with cleared

## 2015-01-23 NOTE — Assessment & Plan Note (Addendum)
Patient was scheduled for left knee arthroscopy tomorrow, 01/24/15. Unfortunately, this procedure will need to be cancelled, given his pre-operative EKG abnormalities.  - Postpone surgery; reschedule once patient is cleared by cardiology - Dr. Angelena Form reviewed the patient's EKG and wishes to see him in the next week; [place] to contact patient to schedule appointment (will likely need echo and stress test) - Patient given refill of his hydrocodone-acetaminophen 5-325 #180 for persistent left knee pain - Patient to restart coumadin today; will also continue to take lovenox until surgery is scheduled; will stop the coumadin 5 days before surgery date and will stop lovenox 24 hours prior to surgery

## 2015-01-23 NOTE — Assessment & Plan Note (Addendum)
Patient had new TWI on EKG at anesthesia assessment today in pre-operative clinic.   - To be seen by [facility] cardiology in next week - Reschedule surgery after this assessment is complete and patient is cleared - Patient to take lovenox and coumadin until surgical date is determined (at which point he will stop coumadin 5 days prior to surgery and stop lovenox 24 hours prior to surgery); given lovenox sample today and administration reviewed with Dr. Julianne Rice team

## 2015-01-23 NOTE — Progress Notes (Signed)
Medicine attending: Medical history, presenting problems, physical findings, and medications, reviewed with Dr Drucilla Schmidt and I concur with her evaluation and management plan. Routine pre-op for knee surgery EKG shows new T wave inversions in anterolateral leads. Patient is asymptomatic. We will initiate cardiology referral.

## 2015-01-23 NOTE — Assessment & Plan Note (Addendum)
Patient was found to have new TWI on EKG during pre-operative anesthesia screen today in preparation for his left knee arthroscopy, scheduled for tomorrow. They are present in leads I, aVL, V5 and V6. No chest pain, no shortness of breath. Patient has a history of cocaine use, but has not used in several weeks. It is reassuring that the patient is asymptomatic, but he will likely need an echo and stress test prior to surgery given that his T-wave inversions are new since his EKG in 10/2014.  - Patient to be seen at [facility] cardiology in the next week ([place] to call patient to schedule) - Postpone surgery

## 2015-01-23 NOTE — Patient Instructions (Addendum)
Warfarin (Coumadin)/Enoxaparin (Lovenox) Bridging   Re-start warfarin for now while awaiting cardiac clearance to determine date.    Also continue ENOXAPARIN injections at 7:00PM. ENOXAPARIN 160 mg injection every 12 hours.   Questions or concerns during this time--please page Dr. Elie Confer at [phone] or call his voicemail at [phone] and leave a message.   You will come back as scheduled for an INR check with Dr. Elie Confer on Monday.   General Instructions:   Please bring your medicines with you each time you come to clinic.  Medicines may include prescription medications, over-the-counter medications, herbal remedies, eye drops, vitamins, or other pills.   Progress Toward Treatment Goals:  Treatment Goal 11/25/2014  Blood pressure unchanged    Self Care Goals & Plans:  Self Care Goal 01/23/2015  Manage my medications take my medicines as prescribed; bring my medications to every visit; refill my medications on time  Monitor my health -  Eat healthy foods eat more vegetables; eat foods that are low in salt; eat baked foods instead of fried foods  Be physically active find an activity I enjoy; take a walk every day  Stop smoking set a quit date and stop smoking  Prevent falls wear appropriate shoes    No flowsheet data found.   Care Management & Community Referrals:  Referral 10/18/2013  Referrals made for care management support none needed  Referrals made to community resources -     Warfarin (Coumadin)/Enoxaparin (Lovenox) Bridging  Instructions for Donald Brady  Re-start warfarin 4 tablets (40 mg total) daily as instructed.  Start ENOXAPARIN 150 mg injections at 7:00PM, inject every 12 hours.    Questions or concerns during this time--please page Dr. Elie Confer at [phone] or call his voicemail at [phone] and leave a message. Dr. Maudie Mercury is also available at [phone].

## 2015-01-23 NOTE — Telephone Encounter (Signed)
New message      Is pt on lisinopril?

## 2015-01-23 NOTE — Telephone Encounter (Signed)
This encounter was created in error - please disregard.

## 2015-01-23 NOTE — Telephone Encounter (Signed)
Donald Brady from [facility] OP surgery called - pre-op labs being done - abnormal EKG - anesthesia would like pt to see cardiology today. B-met and PT done stat. Surgery is sch 01/24/15 10AM left knee - Dr Erlinda Hong. Insurance [employer]. Appt made with Dr Sherrine Maples 01/23/15 10:15AM. Hilda Blades Parag Dorton RN 01/23/15 9:35AM

## 2015-01-23 NOTE — Progress Notes (Signed)
Warfarin (Coumadin)/Enoxaparin (Lovenox) Bridging  01/23/15 INR 1.09  Planned procedure, re-start warfarin 40 mg daily for now while awaiting cardiac clearance to determine date, bridge with ENOXAPARIN 150 mg injection every 12 hours.    Medication Samples have been provided to the patient.  Drug: enoxaparin Strength: 150 mg Qty: 8 LOT: [id number] Exp.Date: 04/2016  The patient has been instructed regarding the correct time, dose, and frequency of taking this medication, including desired effects and most common side effects.   Samples signed for by Dr. Sherrine Maples  Brady,Donald J 11:09 AM 01/23/2015

## 2015-01-24 ENCOUNTER — Ambulatory Visit (INDEPENDENT_AMBULATORY_CARE_PROVIDER_SITE_OTHER): Payer: Self-pay | Admitting: Cardiology

## 2015-01-24 ENCOUNTER — Encounter: Payer: Self-pay | Admitting: Cardiology

## 2015-01-24 VITALS — BP 146/96 | HR 94 | Ht 74.0 in | Wt 345.6 lb

## 2015-01-24 DIAGNOSIS — I2699 Other pulmonary embolism without acute cor pulmonale: Secondary | ICD-10-CM

## 2015-01-24 DIAGNOSIS — E785 Hyperlipidemia, unspecified: Secondary | ICD-10-CM

## 2015-01-24 DIAGNOSIS — R9431 Abnormal electrocardiogram [ECG] [EKG]: Secondary | ICD-10-CM

## 2015-01-24 DIAGNOSIS — I10 Essential (primary) hypertension: Secondary | ICD-10-CM

## 2015-01-24 DIAGNOSIS — N182 Chronic kidney disease, stage 2 (mild): Secondary | ICD-10-CM

## 2015-01-24 DIAGNOSIS — Z720 Tobacco use: Secondary | ICD-10-CM

## 2015-01-24 DIAGNOSIS — D688 Other specified coagulation defects: Secondary | ICD-10-CM

## 2015-01-24 DIAGNOSIS — D6851 Activated protein C resistance: Secondary | ICD-10-CM

## 2015-01-24 SURGERY — ARTHROSCOPY, KNEE, WITH MEDIAL MENISCECTOMY
Anesthesia: General | Laterality: Left

## 2015-01-24 NOTE — Progress Notes (Signed)
Cardiology Office Note   Date:  01/24/2015   ID:  [name], DOB 1975/11/26, MRN [id number]  PCP:  Donald Craver, MD  Cardiologist:   Donald Martinique, MD   Chief Complaint  Patient presents with  . New Evaluation    no chest pain, no shortness of breath, no edema, has pain in left leg, has cramping in left leg,no lightheadedness, no dizziness      History of Present Illness: Donald Brady is a 39 y.o. male who presents for preoperative clearance for knee surgery. He has a history of Factor V Leiden deficiency and prior pulmonary embolus. He is on chronic coumadin. He has a torn meniscus in his knee and was scheduled for surgery but surgery was cancelled due to an abnormal Ecg. He denies any chest pain or SOB. He is walking up to 3 miles per day. Prior to his knee injury he was running daily. He has multiple cardiac risk factors including HTN, hypercholesterolemia, tobacco abuse, and family history of early CAD. He formerly used cocaine but has not used in over 2 years. Weight has been stable. No edema. Overall he states he feels better than ever.   Past Medical History  Diagnosis Date  . Anxiety   . HLD (hyperlipidemia)   . History of seizures     no known cause, per pt. - has been "years" since last seizure; no longer on anticonvulsant  . Arthritis     left knee  . GERD (gastroesophageal reflux disease)   . Hypertension     states BP "runs high"; has been on med. "a while"  . Sleep apnea     had sleep study 10/2013:  "severe" sleep apnea, states could not afford CPAP machine  . History of pulmonary embolus (PE)     stopped Coumadin 01/13/2015; started Lovenox 01/20/2015  . Asthma     no inhaler use in 1 year  . Lateral meniscus tear 01/2015    left knee    Past Surgical History  Procedure Laterality Date  . Video bronchoscopy Bilateral 12/06/2012    Procedure: VIDEO BRONCHOSCOPY WITHOUT FLUORO;  Surgeon: Donald Doom, MD;  Location: [facility];  Service:  Cardiopulmonary;  Laterality: Bilateral;  . Orif femur fracture    . Hand surgery Right     states knuckle removed  . Esophagogastroduodenoscopy (egd) with propofol  07/10/2014     Current Outpatient Prescriptions  Medication Sig Dispense Refill  . citalopram (CELEXA) 20 MG tablet Take 1 tablet (20 mg total) by mouth daily. 90 tablet 3  . enoxaparin (LOVENOX) 150 MG/ML injection Inject 1 mg/kg into the skin every 12 (twelve) hours.    Marland Kitchen HYDROcodone-acetaminophen (NORCO) 5-325 MG per tablet Take 1-2 tablets by mouth every 6 (six) hours as needed for moderate pain. 180 tablet 0  . lisinopril-hydrochlorothiazide (PRINZIDE,ZESTORETIC) 20-25 MG per tablet Take 1 tablet by mouth daily. 30 tablet 11  . Multiple Vitamins-Minerals (MULTIVITAMINS THER. W/MINERALS) TABS Take 1 tablet by mouth daily.    . ranitidine (ZANTAC) 150 MG tablet Take 1 tablet (150 mg total) by mouth 2 (two) times daily. 60 tablet 11  . warfarin (COUMADIN) 10 MG tablet Take 40 mg by mouth daily.     No current facility-administered medications for this visit.    Allergies:   Bee venom; Honey; Shellfish allergy; Ibuprofen; and Betadine    Social History:  The patient  reports that he has been smoking Cigarettes.  He has a 10.5 pack-year  smoking history. He has never used smokeless tobacco. He reports that he drinks alcohol. He reports that he uses illicit drugs (Marijuana).   Family History:  The patient's family history includes Breast cancer in his maternal aunt; Cancer in his father; Clotting disorder in his father; Diabetes in his maternal aunt, maternal uncle, and mother; Hearing loss in his father; Heart disease in his father; Hypertension in his maternal aunt, maternal uncle, and mother; Kidney disease in his maternal aunt; Liver disease in his maternal uncle; Lung cancer in his paternal uncle; Stomach cancer in his maternal uncle and maternal uncle.    ROS:  Please see the history of present illness.   Otherwise,  review of systems are positive for none.   All other systems are reviewed and negative.    PHYSICAL EXAM: VS:  BP 146/96 mmHg  Pulse 94  Ht 6\' 2"  (1.88 m)  Wt 156.763 kg (345 lb 9.6 oz)  BMI 44.35 kg/m2 , BMI Body mass index is 44.35 kg/(m^2). GEN: Well nourished, obese BM, in no acute distress HEENT: normal Neck: no JVD, carotid bruits, or masses Cardiac: RRR; no murmurs, rubs, or gallops,no edema  Respiratory:  clear to auscultation bilaterally, normal work of breathing GI: soft, nontender, nondistended, + BS MS: no deformity or atrophy Skin: warm and dry, no rash Neuro:  Strength and sensation are intact Psych: euthymic mood, full affect   EKG:  EKG is not ordered today. The ekg on June 9,2016 shows NSR with T wave inversion in the lateral leads consistent with ischemia. Old Ecgs reviewed and showed T wave flattening in these leads. I have personally reviewed and interpreted this study.    Recent Labs: 05/20/2014: ALT 73* 10/09/2014: Hemoglobin 18.1*; Platelets 284 01/23/2015: BUN 16; Creatinine, Ser 1.86*; Potassium 3.6; Sodium 137    Lipid Panel    Component Value Date/Time   CHOL 244* 04/23/2014 1125   TRIG 272* 04/23/2014 1125   HDL 46 04/23/2014 1125   CHOLHDL 5.3 04/23/2014 1125   VLDL 54* 04/23/2014 1125   LDLCALC 144* 04/23/2014 1125      Wt Readings from Last 3 Encounters:  01/24/15 156.763 kg (345 lb 9.6 oz)  01/23/15 159.213 kg (351 lb)  01/20/15 161.707 kg (356 lb 8 oz)      Other studies Reviewed: Additional studies/ records that were reviewed today include: none. Review of the above records demonstrates: n/A   ASSESSMENT AND PLAN:  1.  Abnormal Ecg. I suspect that these changes are more related to chronic hypertensive heart disease. He is asymptomatic. He does, however, have multiple cardiac risk factors for CAD. Prior to elective general anesthesia I think it would be appropriate to perform a Lexiscan myoview to assess his risk. We will schedule  this. If low risk he can proceed with surgery. If higher risk will need to consider cardiac cath.  2. Factor V Leiden deficiency. History of pulmonary embolus. Coumadin managed by Donald Brady at [facility]. 3. HTN 4. Tobacco abuse. Encouraged smoking cessation. 5. Hypercholesterolemia. He should be considered for statin therapy given multiple vascular risk factors. 6. Family history of early CAD.   Current medicines are reviewed at length with the patient today.  The patient does not have concerns regarding medicines.  The following changes have been made:  no change  Labs/ tests ordered today include: Lexiscan Myoview    Disposition:   FU with TBD  Signed, Donald Brady, [place]  01/24/2015 10:29 AM    [facility]  [address], [place], [place], [address] Phone [phone], Fax [phone]

## 2015-01-24 NOTE — Patient Instructions (Signed)
We will schedule you for a nuclear stress test  You need to quit smoking!

## 2015-01-27 ENCOUNTER — Ambulatory Visit: Payer: Self-pay

## 2015-01-28 ENCOUNTER — Ambulatory Visit: Payer: Self-pay | Admitting: Cardiovascular Disease

## 2015-02-07 ENCOUNTER — Telehealth (HOSPITAL_COMMUNITY): Payer: Self-pay

## 2015-02-07 NOTE — Telephone Encounter (Signed)
Encounter complete. 

## 2015-02-10 ENCOUNTER — Ambulatory Visit (HOSPITAL_BASED_OUTPATIENT_CLINIC_OR_DEPARTMENT_OTHER)
Admission: RE | Admit: 2015-02-10 | Payer: No Typology Code available for payment source | Source: Ambulatory Visit | Admitting: Orthopaedic Surgery

## 2015-02-10 HISTORY — DX: Personal history of pulmonary embolism: Z86.711

## 2015-02-10 HISTORY — DX: Other tear of lateral meniscus, current injury, unspecified knee, initial encounter: S83.289A

## 2015-02-10 HISTORY — DX: Gastro-esophageal reflux disease without esophagitis: K21.9

## 2015-02-10 HISTORY — DX: Personal history of other specified conditions: Z87.898

## 2015-02-12 ENCOUNTER — Ambulatory Visit (HOSPITAL_COMMUNITY)
Admission: RE | Admit: 2015-02-12 | Discharge: 2015-02-12 | Disposition: A | Discharge status: Home / Self Care | Payer: No Typology Code available for payment source | Source: Ambulatory Visit | Attending: Internal Medicine | Admitting: Internal Medicine

## 2015-02-12 DIAGNOSIS — D6851 Activated protein C resistance: Secondary | ICD-10-CM

## 2015-02-12 DIAGNOSIS — R9439 Abnormal result of other cardiovascular function study: Secondary | ICD-10-CM | POA: Insufficient documentation

## 2015-02-12 DIAGNOSIS — I129 Hypertensive chronic kidney disease with stage 1 through stage 4 chronic kidney disease, or unspecified chronic kidney disease: Secondary | ICD-10-CM | POA: Insufficient documentation

## 2015-02-12 DIAGNOSIS — I10 Essential (primary) hypertension: Secondary | ICD-10-CM

## 2015-02-12 DIAGNOSIS — Z8249 Family history of ischemic heart disease and other diseases of the circulatory system: Secondary | ICD-10-CM | POA: Insufficient documentation

## 2015-02-12 DIAGNOSIS — D688 Other specified coagulation defects: Secondary | ICD-10-CM

## 2015-02-12 DIAGNOSIS — Z86711 Personal history of pulmonary embolism: Secondary | ICD-10-CM | POA: Insufficient documentation

## 2015-02-12 DIAGNOSIS — I252 Old myocardial infarction: Secondary | ICD-10-CM | POA: Insufficient documentation

## 2015-02-12 DIAGNOSIS — F17200 Nicotine dependence, unspecified, uncomplicated: Secondary | ICD-10-CM | POA: Insufficient documentation

## 2015-02-12 DIAGNOSIS — E785 Hyperlipidemia, unspecified: Secondary | ICD-10-CM

## 2015-02-12 DIAGNOSIS — Z720 Tobacco use: Secondary | ICD-10-CM

## 2015-02-12 DIAGNOSIS — E669 Obesity, unspecified: Secondary | ICD-10-CM | POA: Insufficient documentation

## 2015-02-12 DIAGNOSIS — G4733 Obstructive sleep apnea (adult) (pediatric): Secondary | ICD-10-CM | POA: Insufficient documentation

## 2015-02-12 DIAGNOSIS — N182 Chronic kidney disease, stage 2 (mild): Secondary | ICD-10-CM

## 2015-02-12 DIAGNOSIS — J45909 Unspecified asthma, uncomplicated: Secondary | ICD-10-CM | POA: Insufficient documentation

## 2015-02-12 DIAGNOSIS — I2699 Other pulmonary embolism without acute cor pulmonale: Secondary | ICD-10-CM

## 2015-02-12 MED ORDER — REGADENOSON 0.4 MG/5ML IV SOLN
0.4000 mg | Freq: Once | INTRAVENOUS | Status: AC
  Administered 2015-02-12: 0.4 mg via INTRAVENOUS

## 2015-02-12 MED ORDER — TECHNETIUM TC 99M SESTAMIBI GENERIC - CARDIOLITE
32.5000 | Freq: Once | INTRAVENOUS | Status: AC | PRN
  Administered 2015-02-12: 33 via INTRAVENOUS

## 2015-02-13 ENCOUNTER — Ambulatory Visit (HOSPITAL_COMMUNITY)
Admission: RE | Admit: 2015-02-13 | Discharge: 2015-02-13 | Disposition: A | Discharge status: Home / Self Care | Payer: No Typology Code available for payment source | Source: Ambulatory Visit | Attending: Cardiology | Admitting: Cardiology

## 2015-02-13 DIAGNOSIS — R079 Chest pain, unspecified: Secondary | ICD-10-CM

## 2015-02-13 LAB — MYOCARDIAL PERFUSION IMAGING
CHL CUP RESTING HR STRESS: 75 {beats}/min
LVDIAVOL: 215 mL
LVSYSVOL: 130 mL
Peak HR: 103 {beats}/min
SDS: 1
SRS: 1
SSS: 2
TID: 0.98

## 2015-02-13 MED ORDER — TECHNETIUM TC 99M SESTAMIBI GENERIC - CARDIOLITE
32.7000 | Freq: Once | INTRAVENOUS | Status: AC | PRN
  Administered 2015-02-13: 32.7 via INTRAVENOUS

## 2015-02-14 ENCOUNTER — Telehealth: Payer: Self-pay | Admitting: Internal Medicine

## 2015-02-14 ENCOUNTER — Other Ambulatory Visit: Payer: Self-pay

## 2015-02-14 ENCOUNTER — Other Ambulatory Visit: Payer: Self-pay | Admitting: Cardiology

## 2015-02-14 DIAGNOSIS — R9439 Abnormal result of other cardiovascular function study: Secondary | ICD-10-CM

## 2015-02-14 DIAGNOSIS — I10 Essential (primary) hypertension: Secondary | ICD-10-CM

## 2015-02-14 NOTE — Telephone Encounter (Signed)
CLINICAL PHARMACIST ANTICOAG [facility] MANAGEMENT MOSIAH BASTIN is a 39 y.o. male who was referred to clinical pharmacy for procedural management of warfarin (Coumadin).  ASSESSMENT Planned procedure cardiac cath on Wednesday,  02/19/15   Anticoagulant Indication(s): factor V Leiden deficiency and PE history  PLAN Warfarin (Coumadin) Management Stop warfarin 5 days before the planned surgery (02/14/15)  Bridging Instructions  Two (2) days prior to planned surgery, start enoxaparin injections 1 mg/kg every 12 hours.   One (1) day before surgery--STOP enoxaparin injections.  Your LAST enoxaparin shot will be on Tuesday, 02/18/15 in the morning.  Restart warfarin the morning after procedure following previous dosing regimen (40 mg daily).  Follow up in anticoagulation clinic.  Flossie Dibble, PharmD Clinical Pharmacist

## 2015-02-14 NOTE — Telephone Encounter (Signed)
Forward to Dr Maudie Mercury. Dr Maudie Mercury aware.

## 2015-02-14 NOTE — Telephone Encounter (Signed)
Rec'd call from [facility] @ Dr. Harmon Pier office.  Patient scheduled for Cardiac Cath on 02/19/2015.  Questions about patient meds for Paulla Dolly.

## 2015-02-18 ENCOUNTER — Other Ambulatory Visit (INDEPENDENT_AMBULATORY_CARE_PROVIDER_SITE_OTHER): Payer: No Typology Code available for payment source | Admitting: *Deleted

## 2015-02-18 ENCOUNTER — Ambulatory Visit (INDEPENDENT_AMBULATORY_CARE_PROVIDER_SITE_OTHER): Payer: No Typology Code available for payment source | Admitting: Pharmacist

## 2015-02-18 ENCOUNTER — Ambulatory Visit
Admission: RE | Admit: 2015-02-18 | Discharge: 2015-02-18 | Disposition: A | Discharge status: Home / Self Care | Payer: No Typology Code available for payment source | Source: Ambulatory Visit | Attending: Cardiology | Admitting: Cardiology

## 2015-02-18 DIAGNOSIS — I2699 Other pulmonary embolism without acute cor pulmonale: Secondary | ICD-10-CM

## 2015-02-18 DIAGNOSIS — Z7901 Long term (current) use of anticoagulants: Secondary | ICD-10-CM

## 2015-02-18 DIAGNOSIS — I10 Essential (primary) hypertension: Secondary | ICD-10-CM

## 2015-02-18 DIAGNOSIS — R9439 Abnormal result of other cardiovascular function study: Secondary | ICD-10-CM

## 2015-02-18 DIAGNOSIS — D688 Other specified coagulation defects: Secondary | ICD-10-CM

## 2015-02-18 DIAGNOSIS — D6851 Activated protein C resistance: Secondary | ICD-10-CM

## 2015-02-18 DIAGNOSIS — R931 Abnormal findings on diagnostic imaging of heart and coronary circulation: Secondary | ICD-10-CM

## 2015-02-18 LAB — BASIC METABOLIC PANEL
BUN: 14 mg/dL (ref 6–23)
CHLORIDE: 106 meq/L (ref 96–112)
CO2: 24 mEq/L (ref 19–32)
CREATININE: 1.73 mg/dL — AB (ref 0.40–1.50)
Calcium: 9.2 mg/dL (ref 8.4–10.5)
GFR: 57.12 mL/min — AB (ref >60.00)
Glucose, Bld: 94 mg/dL (ref 70–99)
Potassium: 3.7 mEq/L (ref 3.5–5.1)
Sodium: 139 mEq/L (ref 135–145)

## 2015-02-18 LAB — CBC WITH DIFFERENTIAL/PLATELET
Basophils Absolute: 0.1 10*3/uL (ref 0.0–0.1)
Basophils Relative: 0.6 % (ref 0.0–3.0)
EOS PCT: 1.8 % (ref 0.0–5.0)
Eosinophils Absolute: 0.1 10*3/uL (ref 0.0–0.7)
HEMATOCRIT: 49.9 % (ref 39.0–52.0)
Hemoglobin: 16.4 g/dL (ref 13.0–17.0)
LYMPHS ABS: 2.4 10*3/uL (ref 0.7–4.0)
Lymphocytes Relative: 29.5 % (ref 12.0–46.0)
MCHC: 32.9 g/dL (ref 30.0–36.0)
MCV: 90.9 fl (ref 78.0–100.0)
MONO ABS: 0.7 10*3/uL (ref 0.1–1.0)
MONOS PCT: 8.5 % (ref 3.0–12.0)
NEUTROS ABS: 4.9 10*3/uL (ref 1.4–7.7)
Neutrophils Relative %: 59.6 % (ref 43.0–77.0)
Platelets: 279 10*3/uL (ref 150.0–400.0)
RBC: 5.49 Mil/uL (ref 4.22–5.81)
RDW: 14.5 % (ref 11.5–15.5)
WBC: 8.3 10*3/uL (ref 4.0–10.5)

## 2015-02-18 LAB — PROTIME-INR
INR: 1 ratio (ref 0.8–1.0)
Prothrombin Time: 10.6 s (ref 9.6–13.1)

## 2015-02-18 LAB — POCT INR: INR: 1

## 2015-02-18 MED ORDER — SODIUM CHLORIDE 0.9 % IV SOLN: 250.0000 mL | INTRAVENOUS | Status: DC | PRN

## 2015-02-18 MED ORDER — SODIUM CHLORIDE 0.9 % IJ SOLN: 3.0000 mL | Freq: Two times a day (BID) | INTRAMUSCULAR | Status: DC

## 2015-02-18 MED ORDER — ENOXAPARIN SODIUM 150 MG/ML ~~LOC~~ SOLN: 150.0000 mg | Freq: Two times a day (BID) | SUBCUTANEOUS | Status: DC

## 2015-02-18 MED ORDER — SODIUM CHLORIDE 0.9 % IJ SOLN: 3.0000 mL | INTRAMUSCULAR | Status: DC | PRN

## 2015-02-18 MED ORDER — SODIUM CHLORIDE 0.9 % IV SOLN
INTRAVENOUS | Status: DC
  Administered 2015-02-19: 11:00:00 via INTRAVENOUS

## 2015-02-18 NOTE — Progress Notes (Signed)
CLINICAL PHARMACIST ANTICOAG NOTE Donald Brady is a 39 y.o. male who reports to the clinic for monitoring of warfarin treatment.    Indication: factor V Leiden deficiency and PEhistory Duration: indefinite  Anticoagulation Clinic Visit History: Anticoagulation Episode Summary    Current INR goal 2.0-3.0  Next INR check 02/21/2015  INR from last check 1.0! (02/18/2015)  Weekly max dose   Target end date Indefinite  INR check location [facility]  Preferred lab   Send INR reminders to    Indications  Long term current use of anticoagulant therapy [Z79.01] Bilateral pulmonary embolism [I26.99] Factor 5 Leiden mutation heterozygous [D68.8]        Comments        ASSESSMENT Recent Results: Recent results are below, the most recent result is correlated with holding warfarin for procedure. Patient was on enoxaparin 1 mg/kg Q12 hours, last dose taken this morning in preparation for cardiac cath and angiography 02/18/15.  Lab Results  Component Value Date   INR 1.0 02/18/2015   INR 1.0 02/18/2015   INR 1.02 01/23/2015    INR today: Subtherapeutic   Anticoagulation Dosing: INR as of 02/18/2015 and Previous Dosing Information    INR Dt INR Goal Wkly Tot Sun Mon Tue Wed Thu Fri Sat   02/18/2015 1.0 2.0-3.0 0 mg 0 mg 0 mg 0 mg 0 mg 0 mg 0 mg 0 mg    Previous description        Patient has been provided 10 syringes of LMWH enoxaparin 1mg /kg SQ q12h. He began this while in the clinic room today. He is instructed to continue up until 24h PRIOR to his planned procedure on Friday, i.e. His 10:00AM dose of Lovenox on Thursday 9-JUN-16 will be his LAST dose prior to procedure. Patient was to see a PCP today for medical screening and clearance for surgery. He will resume Lovenox 24-72h AFTER the procedure and resume warfarin (as instructed by his orthopaedist) as per his last dosing instructions (40mg  [as 4x10mg  strength warfarin tablets) PO daily WITH Lovenox until Monday 13-JUN-16 at which time  he will see me.     Anticoagulation Dose Instructions as of 02/18/2015      Total Sun Mon Tue Wed Thu Fri Sat   New Dose 0 mg [employer]                        Description        He will resume anticoagulation as instructed: enoxaparin 24-72h AFTER the procedure and resume warfarin per his last dosing instructions 40 mg daily with enoxaparin      PLAN Periprocedural bridging with warfarin + enoxaparin in a patient with history of labile INRs. Of note, patient was on Xarelto (rivaroxaban) but switched back to warfarin due to cost of Xarelto (rivaroxaban). Will discuss with PCP possible switch back to Xarelto (rivaroxaban) in the future through collaboration with Education officer, museum and Development worker, community.  Patient Instructions  Patient educated about medication as defined in this encounter and verbalized understanding by repeating back instructions provided.    Follow-up Return in about 3 days (around 02/21/2015) for Follow up INR on 02/21/2015 at 1:30pm.  Flossie Dibble Clinical Pharmacist  20 minutes spent face-to-face with the patient during the encounter. 50% of time spent on education. 50% of time was spent on assessment and plan.

## 2015-02-18 NOTE — Patient Instructions (Signed)
Patient educated about medication as defined in this encounter and verbalized understanding by repeating back instructions provided.   

## 2015-02-19 ENCOUNTER — Ambulatory Visit (HOSPITAL_COMMUNITY)
Admission: RE | Admit: 2015-02-19 | Discharge: 2015-02-19 | Disposition: A | Discharge status: Home / Self Care | Payer: No Typology Code available for payment source | Source: Ambulatory Visit | Attending: Cardiology | Admitting: Cardiology

## 2015-02-19 ENCOUNTER — Encounter (HOSPITAL_COMMUNITY)
Admission: RE | Disposition: A | Discharge status: Home / Self Care | Payer: No Typology Code available for payment source | Source: Ambulatory Visit | Attending: Cardiology

## 2015-02-19 ENCOUNTER — Other Ambulatory Visit: Payer: Self-pay | Admitting: Cardiology

## 2015-02-19 DIAGNOSIS — N182 Chronic kidney disease, stage 2 (mild): Secondary | ICD-10-CM | POA: Diagnosis present

## 2015-02-19 DIAGNOSIS — K219 Gastro-esophageal reflux disease without esophagitis: Secondary | ICD-10-CM | POA: Insufficient documentation

## 2015-02-19 DIAGNOSIS — D6851 Activated protein C resistance: Secondary | ICD-10-CM

## 2015-02-19 DIAGNOSIS — Z8249 Family history of ischemic heart disease and other diseases of the circulatory system: Secondary | ICD-10-CM | POA: Insufficient documentation

## 2015-02-19 DIAGNOSIS — G4730 Sleep apnea, unspecified: Secondary | ICD-10-CM | POA: Insufficient documentation

## 2015-02-19 DIAGNOSIS — Z86711 Personal history of pulmonary embolism: Secondary | ICD-10-CM | POA: Insufficient documentation

## 2015-02-19 DIAGNOSIS — I10 Essential (primary) hypertension: Secondary | ICD-10-CM | POA: Insufficient documentation

## 2015-02-19 DIAGNOSIS — Z91013 Allergy to seafood: Secondary | ICD-10-CM | POA: Insufficient documentation

## 2015-02-19 DIAGNOSIS — F17210 Nicotine dependence, cigarettes, uncomplicated: Secondary | ICD-10-CM | POA: Insufficient documentation

## 2015-02-19 DIAGNOSIS — R931 Abnormal findings on diagnostic imaging of heart and coronary circulation: Secondary | ICD-10-CM

## 2015-02-19 DIAGNOSIS — E669 Obesity, unspecified: Secondary | ICD-10-CM | POA: Insufficient documentation

## 2015-02-19 DIAGNOSIS — I2510 Atherosclerotic heart disease of native coronary artery without angina pectoris: Secondary | ICD-10-CM | POA: Insufficient documentation

## 2015-02-19 DIAGNOSIS — G4733 Obstructive sleep apnea (adult) (pediatric): Secondary | ICD-10-CM | POA: Diagnosis present

## 2015-02-19 DIAGNOSIS — Z7901 Long term (current) use of anticoagulants: Secondary | ICD-10-CM | POA: Insufficient documentation

## 2015-02-19 DIAGNOSIS — Z720 Tobacco use: Secondary | ICD-10-CM | POA: Diagnosis present

## 2015-02-19 DIAGNOSIS — R9439 Abnormal result of other cardiovascular function study: Secondary | ICD-10-CM | POA: Diagnosis present

## 2015-02-19 DIAGNOSIS — E780 Pure hypercholesterolemia: Secondary | ICD-10-CM | POA: Insufficient documentation

## 2015-02-19 DIAGNOSIS — J45909 Unspecified asthma, uncomplicated: Secondary | ICD-10-CM | POA: Insufficient documentation

## 2015-02-19 DIAGNOSIS — F419 Anxiety disorder, unspecified: Secondary | ICD-10-CM | POA: Insufficient documentation

## 2015-02-19 DIAGNOSIS — Z6841 Body Mass Index (BMI) 40.0 and over, adult: Secondary | ICD-10-CM | POA: Insufficient documentation

## 2015-02-19 HISTORY — PX: CARDIAC CATHETERIZATION: SHX172

## 2015-02-19 SURGERY — LEFT HEART CATH AND CORONARY ANGIOGRAPHY
Anesthesia: LOCAL

## 2015-02-19 MED ORDER — SODIUM CHLORIDE 0.9 % IJ SOLN: 3.0000 mL | INTRAMUSCULAR | Status: DC | PRN

## 2015-02-19 MED ORDER — LIDOCAINE HCL (PF) 1 % IJ SOLN
INTRAMUSCULAR | Status: DC | PRN
  Administered 2015-02-19: 13:00:00

## 2015-02-19 MED ORDER — ISOSORBIDE MONONITRATE ER 30 MG PO TB24: 30.0000 mg | ORAL_TABLET | Freq: Every day | ORAL | Status: DC

## 2015-02-19 MED ORDER — SODIUM CHLORIDE 0.9 % IV SOLN: 250.0000 mL | INTRAVENOUS | Status: DC | PRN

## 2015-02-19 MED ORDER — IOHEXOL 350 MG/ML SOLN
INTRAVENOUS | Status: DC | PRN
  Administered 2015-02-19: 85 mL via INTRAVENOUS

## 2015-02-19 MED ORDER — MIDAZOLAM HCL 2 MG/2ML IJ SOLN
INTRAMUSCULAR | Status: AC
  Filled 2015-02-19: qty 2

## 2015-02-19 MED ORDER — ASPIRIN 81 MG PO CHEW
CHEWABLE_TABLET | ORAL | Status: AC
  Filled 2015-02-19: qty 1

## 2015-02-19 MED ORDER — ASPIRIN 81 MG PO CHEW
81.0000 mg | CHEWABLE_TABLET | ORAL | Status: AC
  Administered 2015-02-19: 81 mg via ORAL

## 2015-02-19 MED ORDER — LIDOCAINE HCL (PF) 1 % IJ SOLN
INTRAMUSCULAR | Status: AC
  Filled 2015-02-19: qty 30

## 2015-02-19 MED ORDER — SODIUM CHLORIDE 0.9 % WEIGHT BASED INFUSION: 1.0000 mL/kg/h | INTRAVENOUS | Status: AC

## 2015-02-19 MED ORDER — HEPARIN SODIUM (PORCINE) 1000 UNIT/ML IJ SOLN
INTRAMUSCULAR | Status: DC | PRN
  Administered 2015-02-19: 7000 [IU] via INTRAVENOUS

## 2015-02-19 MED ORDER — VERAPAMIL HCL 2.5 MG/ML IV SOLN
INTRAVENOUS | Status: AC
  Filled 2015-02-19: qty 2

## 2015-02-19 MED ORDER — NITROGLYCERIN 1 MG/10 ML FOR IR/CATH LAB
INTRA_ARTERIAL | Status: AC
  Filled 2015-02-19: qty 10

## 2015-02-19 MED ORDER — FENTANYL CITRATE (PF) 100 MCG/2ML IJ SOLN
INTRAMUSCULAR | Status: AC
  Filled 2015-02-19: qty 2

## 2015-02-19 MED ORDER — SODIUM CHLORIDE 0.9 % IJ SOLN: 3.0000 mL | Freq: Two times a day (BID) | INTRAMUSCULAR | Status: DC

## 2015-02-19 MED ORDER — VERAPAMIL HCL 2.5 MG/ML IV SOLN
INTRAVENOUS | Status: DC | PRN
  Administered 2015-02-19: 12:00:00 via INTRA_ARTERIAL

## 2015-02-19 MED ORDER — HYDRALAZINE HCL 10 MG PO TABS: 10.0000 mg | ORAL_TABLET | Freq: Three times a day (TID) | ORAL | Status: DC

## 2015-02-19 MED ORDER — HEPARIN SODIUM (PORCINE) 1000 UNIT/ML IJ SOLN
INTRAMUSCULAR | Status: AC
  Filled 2015-02-19: qty 1

## 2015-02-19 MED ORDER — HEPARIN (PORCINE) IN NACL 2-0.9 UNIT/ML-% IJ SOLN
INTRAMUSCULAR | Status: AC
  Filled 2015-02-19: qty 1000

## 2015-02-19 MED ORDER — MIDAZOLAM HCL 2 MG/2ML IJ SOLN
INTRAMUSCULAR | Status: DC | PRN
  Administered 2015-02-19: 1 mg via INTRAVENOUS

## 2015-02-19 MED ORDER — FENTANYL CITRATE (PF) 100 MCG/2ML IJ SOLN
INTRAMUSCULAR | Status: DC | PRN
  Administered 2015-02-19: 25 ug via INTRAVENOUS

## 2015-02-19 SURGICAL SUPPLY — 11 items

## 2015-02-19 NOTE — Discharge Instructions (Addendum)
Start Imdur 30 mg daily  Start hydralazine 10 mg three times a day  Resume Coumadin tonight  Resume Lovenox in am- twice a day.  Follow up with coumadin clinic as scheduled.  Radial Site Care Refer to this sheet in the next few weeks. These instructions provide you with information on caring for yourself after your procedure. Your caregiver may also give you more specific instructions. Your treatment has been planned according to current medical practices, but problems sometimes occur. Call your caregiver if you have any problems or questions after your procedure. HOME CARE INSTRUCTIONS  You may shower the day after the procedure.Remove the bandage (dressing) and gently wash the site with plain soap and water.Gently pat the site dry.  Do not apply powder or lotion to the site.  Do not submerge the affected site in water for 3 to 5 days.  Inspect the site at least twice daily.  Do not flex or bend the affected arm for 24 hours.  No lifting over 5 pounds (2.3 kg) for 5 days after your procedure.  Do not drive home if you are discharged the same day of the procedure. Have someone else drive you.  You may drive 24 hours after the procedure unless otherwise instructed by your caregiver.  Do not operate machinery or power tools for 24 hours.  A responsible adult should be with you for the first 24 hours after you arrive home. What to expect:  Any bruising will usually fade within 1 to 2 weeks.  Blood that collects in the tissue (hematoma) may be painful to the touch. It should usually decrease in size and tenderness within 1 to 2 weeks. SEEK IMMEDIATE MEDICAL CARE IF:  You have unusual pain at the radial site.  You have redness, warmth, swelling, or pain at the radial site.  You have drainage (other than a small amount of blood on the dressing).  You have chills.  You have a fever or persistent symptoms for more than 72 hours.  You have a fever and your symptoms suddenly  get worse.  Your arm becomes pale, cool, tingly, or numb.  You have heavy bleeding from the site. Hold pressure on the site. Document Released: 09/04/2010 Document Revised: 10/25/2011 Document Reviewed: 09/04/2010 [facility] Patient Information 2015 [place], [place]. This information is not intended to replace advice given to you by your health care provider. Make sure you discuss any questions you have with your health care provider.

## 2015-02-19 NOTE — Interval H&P Note (Signed)
History and Physical Interval Note:  02/19/2015 12:11 PM  Donald Brady  has presented today for surgery, with the diagnosis of abnormal myoview  The various methods of treatment have been discussed with the patient and family. After consideration of risks, benefits and other options for treatment, the patient has consented to  Procedure(s): Left Heart Cath and Coronary Angiography (N/A) as a surgical intervention .  The patient's history has been reviewed, patient examined, no change in status, stable for surgery.  I have reviewed the patient's chart and labs.  Questions were answered to the patient's satisfaction.   Cath Lab Visit (complete for each Cath Lab visit)  Clinical Evaluation Leading to the Procedure:   ACS: No.  Non-ACS:    Anginal Classification: No Symptoms  Anti-ischemic medical therapy: Minimal Therapy (1 class of medications)  Non-Invasive Test Results: Intermediate-risk stress test findings: cardiac mortality 1-3%/year  Prior CABG: No previous CABG        Donald Brady 02/19/2015 12:11 PM

## 2015-02-19 NOTE — Research (Signed)
CADLAD Informed Consent   Subject Name: Donald Brady  Subject met inclusion and exclusion criteria.  The informed consent form, study requirements and expectations were reviewed with the subject and questions and concerns were addressed prior to the signing of the consent form.  The subject verbalized understanding of the trail requirements.  The subject agreed to participate in the CADLAD trial and signed the informed consent.  The informed consent was obtained prior to performance of any protocol-specific procedures for the subject.  A copy of the signed informed consent was given to the subject and a copy was placed in the subject's medical record.  Hedrick,Areg Bialas W 02/19/2015, 0016

## 2015-02-19 NOTE — H&P (View-Only) (Signed)
Cardiology Office Note   Date:  01/24/2015   ID:  MANSOUR BALBOA, DOB 15-May-1976, MRN [id number]  PCP:  Osa Craver, MD  Cardiologist:   Peter Martinique, MD   Chief Complaint  Patient presents with  . New Evaluation    no chest pain, no shortness of breath, no edema, has pain in left leg, has cramping in left leg,no lightheadedness, no dizziness      History of Present Illness: Donald Brady is a 39 y.o. male who presents for preoperative clearance for knee surgery. He has a history of Factor V Leiden deficiency and prior pulmonary embolus. He is on chronic coumadin. He has a torn meniscus in his knee and was scheduled for surgery but surgery was cancelled due to an abnormal Ecg. He denies any chest pain or SOB. He is walking up to 3 miles per day. Prior to his knee injury he was running daily. He has multiple cardiac risk factors including HTN, hypercholesterolemia, tobacco abuse, and family history of early CAD. He formerly used cocaine but has not used in over 2 years. Weight has been stable. No edema. Overall he states he feels better than ever.   Past Medical History  Diagnosis Date  . Anxiety   . HLD (hyperlipidemia)   . History of seizures     no known cause, per pt. - has been "years" since last seizure; no longer on anticonvulsant  . Arthritis     left knee  . GERD (gastroesophageal reflux disease)   . Hypertension     states BP "runs high"; has been on med. "a while"  . Sleep apnea     had sleep study 10/2013:  "severe" sleep apnea, states could not afford CPAP machine  . History of pulmonary embolus (PE)     stopped Coumadin 01/13/2015; started Lovenox 01/20/2015  . Asthma     no inhaler use in 1 year  . Lateral meniscus tear 01/2015    left knee    Past Surgical History  Procedure Laterality Date  . Video bronchoscopy Bilateral 12/06/2012    Procedure: VIDEO BRONCHOSCOPY WITHOUT FLUORO;  Surgeon: Juanito Doom, MD;  Location: [facility];  Service:  Cardiopulmonary;  Laterality: Bilateral;  . Orif femur fracture    . Hand surgery Right     states knuckle removed  . Esophagogastroduodenoscopy (egd) with propofol  07/10/2014     Current Outpatient Prescriptions  Medication Sig Dispense Refill  . citalopram (CELEXA) 20 MG tablet Take 1 tablet (20 mg total) by mouth daily. 90 tablet 3  . enoxaparin (LOVENOX) 150 MG/ML injection Inject 1 mg/kg into the skin every 12 (twelve) hours.    Marland Kitchen HYDROcodone-acetaminophen (NORCO) 5-325 MG per tablet Take 1-2 tablets by mouth every 6 (six) hours as needed for moderate pain. 180 tablet 0  . lisinopril-hydrochlorothiazide (PRINZIDE,ZESTORETIC) 20-25 MG per tablet Take 1 tablet by mouth daily. 30 tablet 11  . Multiple Vitamins-Minerals (MULTIVITAMINS THER. W/MINERALS) TABS Take 1 tablet by mouth daily.    . ranitidine (ZANTAC) 150 MG tablet Take 1 tablet (150 mg total) by mouth 2 (two) times daily. 60 tablet 11  . warfarin (COUMADIN) 10 MG tablet Take 40 mg by mouth daily.     No current facility-administered medications for this visit.    Allergies:   Bee venom; Honey; Shellfish allergy; Ibuprofen; and Betadine    Social History:  The patient  reports that he has been smoking Cigarettes.  He has a 10.5 pack-year  smoking history. He has never used smokeless tobacco. He reports that he drinks alcohol. He reports that he uses illicit drugs (Marijuana).   Family History:  The patient's family history includes Breast cancer in his maternal aunt; Cancer in his father; Clotting disorder in his father; Diabetes in his maternal aunt, maternal uncle, and mother; Hearing loss in his father; Heart disease in his father; Hypertension in his maternal aunt, maternal uncle, and mother; Kidney disease in his maternal aunt; Liver disease in his maternal uncle; Lung cancer in his paternal uncle; Stomach cancer in his maternal uncle and maternal uncle.    ROS:  Please see the history of present illness.   Otherwise,  review of systems are positive for none.   All other systems are reviewed and negative.    PHYSICAL EXAM: VS:  BP 146/96 mmHg  Pulse 94  Ht 6\' 2"  (1.88 m)  Wt 156.763 kg (345 lb 9.6 oz)  BMI 44.35 kg/m2 , BMI Body mass index is 44.35 kg/(m^2). GEN: Well nourished, obese BM, in no acute distress HEENT: normal Neck: no JVD, carotid bruits, or masses Cardiac: RRR; no murmurs, rubs, or gallops,no edema  Respiratory:  clear to auscultation bilaterally, normal work of breathing GI: soft, nontender, nondistended, + BS MS: no deformity or atrophy Skin: warm and dry, no rash Neuro:  Strength and sensation are intact Psych: euthymic mood, full affect   EKG:  EKG is not ordered today. The ekg on June 9,2016 shows NSR with T wave inversion in the lateral leads consistent with ischemia. Old Ecgs reviewed and showed T wave flattening in these leads. I have personally reviewed and interpreted this study.    Recent Labs: 05/20/2014: ALT 73* 10/09/2014: Hemoglobin 18.1*; Platelets 284 01/23/2015: BUN 16; Creatinine, Ser 1.86*; Potassium 3.6; Sodium 137    Lipid Panel    Component Value Date/Time   CHOL 244* 04/23/2014 1125   TRIG 272* 04/23/2014 1125   HDL 46 04/23/2014 1125   CHOLHDL 5.3 04/23/2014 1125   VLDL 54* 04/23/2014 1125   LDLCALC 144* 04/23/2014 1125      Wt Readings from Last 3 Encounters:  01/24/15 156.763 kg (345 lb 9.6 oz)  01/23/15 159.213 kg (351 lb)  01/20/15 161.707 kg (356 lb 8 oz)      Other studies Reviewed: Additional studies/ records that were reviewed today include: none. Review of the above records demonstrates: n/A   ASSESSMENT AND PLAN:  1.  Abnormal Ecg. I suspect that these changes are more related to chronic hypertensive heart disease. He is asymptomatic. He does, however, have multiple cardiac risk factors for CAD. Prior to elective general anesthesia I think it would be appropriate to perform a Lexiscan myoview to assess his risk. We will schedule  this. If low risk he can proceed with surgery. If higher risk will need to consider cardiac cath.  2. Factor V Leiden deficiency. History of pulmonary embolus. Coumadin managed by Paulla Dolly at [facility]. 3. HTN 4. Tobacco abuse. Encouraged smoking cessation. 5. Hypercholesterolemia. He should be considered for statin therapy given multiple vascular risk factors. 6. Family history of early CAD.   Current medicines are reviewed at length with the patient today.  The patient does not have concerns regarding medicines.  The following changes have been made:  no change  Labs/ tests ordered today include: Lexiscan Myoview    Disposition:   FU with TBD  Signed, Peter Martinique, [place]  01/24/2015 10:29 AM    [facility]  [address], [place], [place], [address] Phone [phone], Fax [phone]

## 2015-02-20 ENCOUNTER — Encounter (HOSPITAL_COMMUNITY): Payer: Self-pay | Admitting: Cardiology

## 2015-02-20 ENCOUNTER — Ambulatory Visit: Payer: No Typology Code available for payment source | Admitting: Pharmacist

## 2015-02-21 ENCOUNTER — Ambulatory Visit: Payer: No Typology Code available for payment source | Admitting: Pharmacist

## 2015-02-21 MED FILL — Nitroglycerin IV Soln 100 MCG/ML in D5W: INTRA_ARTERIAL | Qty: 10 | Status: AC

## 2015-02-24 ENCOUNTER — Other Ambulatory Visit: Payer: Self-pay | Admitting: Internal Medicine

## 2015-02-24 ENCOUNTER — Other Ambulatory Visit: Payer: Self-pay | Admitting: *Deleted

## 2015-02-24 DIAGNOSIS — M1712 Unilateral primary osteoarthritis, left knee: Secondary | ICD-10-CM

## 2015-02-24 NOTE — Telephone Encounter (Signed)
Pharmacy. [place] out patient Asking for pain medication

## 2015-02-24 NOTE — Progress Notes (Signed)
I have reviewed Dr. Julianne Rice note.  Coumadin on hold for planned procedure.  Post procedure anticoagulation coordinated as noted in Dr. Julianne Rice note.

## 2015-02-24 NOTE — Telephone Encounter (Signed)
Last filled per pharm 01/24/2015 #180 Last visit 01/23/2015 Last uds 10/2014, no fyi

## 2015-02-26 ENCOUNTER — Ambulatory Visit (INDEPENDENT_AMBULATORY_CARE_PROVIDER_SITE_OTHER): Payer: No Typology Code available for payment source | Admitting: Pharmacist

## 2015-02-26 DIAGNOSIS — Z7901 Long term (current) use of anticoagulants: Secondary | ICD-10-CM

## 2015-02-26 DIAGNOSIS — D6851 Activated protein C resistance: Secondary | ICD-10-CM

## 2015-02-26 DIAGNOSIS — I2699 Other pulmonary embolism without acute cor pulmonale: Secondary | ICD-10-CM

## 2015-02-26 LAB — POCT INR: INR: 1.3

## 2015-02-26 MED ORDER — RIVAROXABAN 20 MG PO TABS: 20.0000 mg | ORAL_TABLET | Freq: Every day | ORAL | Status: DC

## 2015-02-26 MED ORDER — HYDROCODONE-ACETAMINOPHEN 5-325 MG PO TABS: 1.0000 | ORAL_TABLET | Freq: Four times a day (QID) | ORAL | Status: DC | PRN

## 2015-02-26 NOTE — Telephone Encounter (Signed)
Pt informed Rx is ready 

## 2015-02-26 NOTE — Progress Notes (Signed)
CLINICAL PHARMACY ANTICOAGULATION TRANSITION NOTE Donald Brady is a 39 y.o. male who is currently on an anti-coagulation regimen.  RECENT RESULTS: Lab Results  Component Value Date   INR 1.3 02/26/2015   INR 1.0 02/18/2015   INR 1.0 02/18/2015   ANTICOAG SUMMARY: Anticoagulation Episode Summary    Current INR goal 2.0-3.0  Next INR check 03/26/2015  INR from last check 1.3! (02/26/2015)  Weekly max dose   Target end date Indefinite  [facility] check location [facility]  Preferred lab   Send INR reminders to    Indications  Long term current use of anticoagulant therapy [Z79.01] Bilateral pulmonary embolism [I26.99] Factor 5 Leiden mutation heterozygous [D68.8]        Comments        ANTICOAG TODAY: Anticoagulation Summary as of 02/26/2015    INR goal 2.0-3.0  Selected INR 1.3! (02/26/2015)  Next INR check 03/26/2015  Target end date Indefinite   Indications  Long term current use of anticoagulant therapy [Z79.01] Bilateral pulmonary embolism [I26.99] Factor 5 Leiden mutation heterozygous [D68.8]      Anticoagulation Episode Summary    [facility] check [facility]   Preferred lab    Send INR reminders to    Comments      ASSESSMENT Indication(s): history of bilateral PE, factor V leiden deficiency Duration: indefinite  Labs:    Component Value Date/Time   AST 64* 05/20/2014 1512   ALT 73* 05/20/2014 1512   NA 139 02/18/2015 1239   K 3.7 02/18/2015 1239   CL 106 02/18/2015 1239   CO2 24 02/18/2015 1239   BUN 14 02/18/2015 1239   CREATININE 1.73* 02/18/2015 1239   CREATININE 1.49* 11/25/2014 1103   CALCIUM 9.2 02/18/2015 1239   GFRAA 52* 01/23/2015 0934   GFRAA 68 11/25/2014 1103   WBC 8.3 02/18/2015 1239   HGB 16.4 02/18/2015 1239   HCT 49.9 02/18/2015 1239   PLT 279.0 02/18/2015 1239   Patient was considered for transition from warfarin to DOAC due to history of labile INRs and TTR approx 20% over the past 3 years. Patient previously unable to  switch to DOAC due to cost. The following factors were considered in this patient: [x]  Contraindications to direct oral anticoagulants [x]  Renal and hepatic function [x]  Drug-drug interactions [x]  Drug-disease interactions [x]  Financial barriers [x]  Plan formulated and approved by PCP  Adherence: Patient has adherence challenges. Including cost---collaborated with financial counselor for financial assistance. Free 30-day voucher also provided until paperwork processed.  Safety: Patient reports no recent signs or symptoms of bleeding. Patient reports no signs of symptoms of thrombosis.      PLAN:  Warfarin will be discontinued today, as INR is < 3.     Transition to new oral anticoagulant rivaroxaban (Xarelto) 20 mg daily.  Monitoring: 1 month via PCP, will continue to collaborate as needed. Patient was educated about rivaroxaban (Xarelto), including name, instructions, indication, goals of therapy, potential side effects, importance of adherence, and safe use. Patient verbalized understanding by repeating back information. Patient states he is working on diet, exercise, and tobacco cessation. Advised patient to contact me if further assistance needed or if any medication concerns arise.  Bailey Pharmacist 02/26/2015, 3:01 PM

## 2015-02-26 NOTE — Patient Instructions (Signed)
Information about your medication: Anticoagulant  Generic Name (Brand): Rivaroxaban (Xarelto)  Rivaroxaban is used to reduce the risk of forming blood clots.  Common SIDE EFFECTS you may experience include: extremity pain and increased risk of bleeding or bruising.  If a dose is missed, it should be taken as soon as possible on the same day.    Multiple drug-drug interactions exist for this medication.  Consult healthcare professional prior to starting a new drug.  Tell your physicians and dentists that you are taking this drug before elective surgery or invasive procedures and before any new drug is prescribed.  Contact your health care provider if you experience: any signs of blood loss or unusual bruising or bleeding.

## 2015-02-28 ENCOUNTER — Telehealth: Payer: Self-pay | Admitting: *Deleted

## 2015-02-28 NOTE — Telephone Encounter (Signed)
Pt's mother calls and states pt needs a letter stating that due to the seriousness of his illnesses that he is unable to work and needs to continue receiving food stamps, his illnesses will need to be listed, this needs to be addressed to [employer], caseworker from social services Also she has some paperwork that she does not understand completely and would like shanag. To call her and set a time that she can visit the clinic and obtain some guidance from shana [phone]

## 2015-03-03 ENCOUNTER — Encounter: Payer: Self-pay | Admitting: Licensed Clinical Social Worker

## 2015-03-03 NOTE — Telephone Encounter (Signed)
I will be happy to help fill out the requested forms. I understand they need to be completed by 7/22 or does the company need them received by 7/22? Is the paperwork already in my box or will it be coming later this week? If they are here, I can come by the office tomorrow; otherwise, I will be in clinic on 7/20 in the afternoon.

## 2015-03-03 NOTE — Telephone Encounter (Signed)
Mother placed call to CSW to inquire about letter for food stamp redetermination.  CSW informed mother, still awaiting response from PCP.  PCP is scheduled for 03/05/15 for clinic visits.  Mother informed CSW provided physician listing, address, current med list for Donald Brady to pick up from the front office.  Pt can utilize this information for disability application.

## 2015-03-03 NOTE — Telephone Encounter (Signed)
Thank you.  CSW will contact pt/family.

## 2015-03-03 NOTE — Telephone Encounter (Signed)
CSW placed call to Mr. Leisinger to obtain permission to speak with mother regarding needed paperwork.  Pt gave verbal permission for CSW to speak with mother and states he will sign necessary forms at his next appointment.  CSW placed call to pt's mother, Kainoah Bartosiewicz.  Mother reiterated need to have signed letter indicating Mr. Hausen is unable to work at this time due to his medical condition, allowing him to continue to receive his food stamp benefit.  CSW informed Ms. Helene Kelp, letter will need to be completed by PCP and office requests two weeks for written letters/forms.  Mother states letter is needed by 7/22 for pt to continue to receives benefits.  Mother aware this note will be forwarded to PCP.   In addition, mother request assistance with SSA disability application.  CSW forwarded mother back to [facility] for application assistance but will provide AVS with dx and med list for Mr. Yanes.  Pt will be in office on 7/19.  CSW will leave at front office.

## 2015-03-04 ENCOUNTER — Encounter: Payer: Self-pay | Admitting: Licensed Clinical Social Worker

## 2015-03-04 ENCOUNTER — Ambulatory Visit: Payer: Self-pay

## 2015-03-04 NOTE — Telephone Encounter (Signed)
Thank you.  The office needs to receive by 03/07/15.  There are no forms that need to be completed, only a  Letter from PCP. I can draft the letter in [place], print and have in your mailbox.  Just let me know.  Thanks

## 2015-03-04 NOTE — Telephone Encounter (Signed)
That would be very much appreciated if you don't mind. I can come by later today or tomorrow and sign it.  Thank you Edwena Blow!  [place]

## 2015-03-05 ENCOUNTER — Ambulatory Visit: Payer: No Typology Code available for payment source

## 2015-03-05 ENCOUNTER — Telehealth: Payer: Self-pay | Admitting: Licensed Clinical Social Worker

## 2015-03-05 NOTE — Telephone Encounter (Signed)
Per mother's request, letter faxed to [phone]

## 2015-03-05 NOTE — Telephone Encounter (Signed)
CSW placed call to Ms. Moyano to notify requested letter is ready for pick up.  Mother will attempt to obtain a fax number for CSW to fax to pt's caseworker.

## 2015-03-05 NOTE — Telephone Encounter (Signed)
Ms. Standage placed call in to CSW to inquire about letter for DSS/Food Stamps.  CSW informed Ms. Helene Kelp, PCP is agreeable to sign letter and will be ready for pick on 02/2015.  Pt has an appointment with financial counselor on 7/20, pt can pick up from front office.  During this conversation, Ms. Revelo began to discuss how her electric bill has gone up since pt moved in with her.  Mother states pt needs to sleep upright with a/c on and fans on as he has difficulty breathing at night.  Mother is aware pt has sleep apnea and needs a CPAP.  CSW informed mother pt will need insurance prior to being able to obtain a CPAP.  Mother continue to state all the ways pt has changed his diet to lose weight, but pt still has difficulty sleeping at night and difficulty waiting/standing in lines to obtain paperwork.  CSW encouraged mother to have pt schedule an appointment with PCP as pt was to return for a one month follow up, but did not.  Mother voiced concern of pt being billed for office visit.  CSW informed mother pt's Hospital discount is through 03/06/15 and [facility] will continue to see.

## 2015-03-07 ENCOUNTER — Telehealth: Payer: Self-pay

## 2015-03-07 NOTE — Telephone Encounter (Signed)
Received surgical clearance from [employer].Dr.Jordan cleared patient for upcoming surgery.Advised pt will need bridging lovenox to come off coumadin.To be managed by Dr.Jay Groce.Form faxed back 02/27/15 to fax # [phone].

## 2015-03-11 ENCOUNTER — Ambulatory Visit: Payer: No Typology Code available for payment source

## 2015-03-17 ENCOUNTER — Ambulatory Visit (INDEPENDENT_AMBULATORY_CARE_PROVIDER_SITE_OTHER): Payer: No Typology Code available for payment source | Admitting: Pharmacist

## 2015-03-17 ENCOUNTER — Ambulatory Visit: Payer: No Typology Code available for payment source

## 2015-03-17 DIAGNOSIS — D6851 Activated protein C resistance: Secondary | ICD-10-CM

## 2015-03-17 DIAGNOSIS — D688 Other specified coagulation defects: Secondary | ICD-10-CM

## 2015-03-17 DIAGNOSIS — I2699 Other pulmonary embolism without acute cor pulmonale: Secondary | ICD-10-CM

## 2015-03-17 DIAGNOSIS — Z7901 Long term (current) use of anticoagulants: Secondary | ICD-10-CM

## 2015-03-17 NOTE — Progress Notes (Signed)
Perioperative Anticoagulation Management Donald Brady is a 39 y.o. male who was referred to clinical pharmacy for procedural management of rivaroxaban (Xarelto).  ASSESSMENT Planned procedure 03/26/15, arthroscopy, meniscectomy, chondroplasty  Anticoagulant Indication(s): factor V Leiden deficiency and PE bilateral  Bridging: not indicated  Labs:    Component Value Date/Time   AST 64* 05/20/2014 1512   ALT 73* 05/20/2014 1512   NA 139 02/18/2015 1239   K 3.7 02/18/2015 1239   CL 106 02/18/2015 1239   CO2 24 02/18/2015 1239   GLUCOSE 94 02/18/2015 1239   HGBA1C 5.7 08/03/2013 1018   HGBA1C 5.7* 11/27/2011 1332   BUN 14 02/18/2015 1239   CREATININE 1.73* 02/18/2015 1239   CREATININE 1.49* 11/25/2014 1103   CALCIUM 9.2 02/18/2015 1239   GFRAA 52* 01/23/2015 0934   GFRAA 68 11/25/2014 1103   WBC 8.3 02/18/2015 1239   HGB 16.4 02/18/2015 1239   HCT 49.9 02/18/2015 1239   PLT 279.0 02/18/2015 1239   INR 1.3 02/26/2015 1459   INR 1.0 02/18/2015 1239   PLAN Patient Instructions  Instructions for Donald Brady  Planned procedure on Wednesday 03/26/15.  Last dose of Xarelto (rivaroxaban) will be on 03/24/15.  Do not take Xarelto (rivaroxaban) on 03/25/15.  Upon discharge from the hospital/procedure/surgery: restart Xarelto (rivaroxaban) 20 mg daily in the evening.  Questions or concerns during this time--please page Dr. Maudie Mercury at [phone] or call [phone].  Follow-up No Follow-up on file.  Flossie Dibble, PharmD Clinical Pharmacist  15 minutes spent face-to-face with the patient during the encounter. 50% of time spent on education. 50% of time was spent on assessment and plan

## 2015-03-17 NOTE — Patient Instructions (Addendum)
Instructions for Donald Brady  Planned procedure on Wednesday 03/26/15.  Last dose of Xarelto (rivaroxaban) will be on 03/24/15.  Do not take Xarelto (rivaroxaban) on 03/25/15.  Upon discharge from the hospital/procedure/surgery: restart Xarelto (rivaroxaban) 20 mg daily in the evening.  Questions or concerns during this time--please page Dr. Maudie Mercury at [phone] or call [phone].

## 2015-03-18 ENCOUNTER — Other Ambulatory Visit (HOSPITAL_BASED_OUTPATIENT_CLINIC_OR_DEPARTMENT_OTHER): Payer: Self-pay | Admitting: Orthopaedic Surgery

## 2015-03-21 ENCOUNTER — Encounter (HOSPITAL_BASED_OUTPATIENT_CLINIC_OR_DEPARTMENT_OTHER): Payer: Self-pay | Admitting: *Deleted

## 2015-03-21 NOTE — Progress Notes (Signed)
   03/21/15 1151  OBSTRUCTIVE SLEEP APNEA  Have you ever been diagnosed with sleep apnea through a sleep study? Yes  If yes, do you have and use a CPAP or BPAP machine every night? 0 (states unable to afford CPAP)  Do you snore loudly (loud enough to be heard through closed doors)?  0  Do you often feel tired, fatigued, or sleepy during the daytime? 1  Has anyone observed you stop breathing during your sleep? 0  Do you have, or are you being treated for high blood pressure? 1  BMI more than 35 kg/m2? 1  Age over 35 years old? 0  Gender: 1  Obstructive Sleep Apnea Score 4

## 2015-03-21 NOTE — Pre-Procedure Instructions (Signed)
History discussed with Dr. Al Corpus; will need to stay overnight due to untreated sleep apnea; message left for Sherry/surgery scheduler at Dr. Phoebe Sharps office.

## 2015-03-21 NOTE — Pre-Procedure Instructions (Signed)
To come for BMET and EKG 

## 2015-03-24 ENCOUNTER — Encounter (HOSPITAL_BASED_OUTPATIENT_CLINIC_OR_DEPARTMENT_OTHER)
Admission: RE | Admit: 2015-03-24 | Discharge: 2015-03-24 | Disposition: A | Discharge status: Home / Self Care | Payer: No Typology Code available for payment source | Source: Ambulatory Visit | Attending: Orthopaedic Surgery | Admitting: Orthopaedic Surgery

## 2015-03-24 ENCOUNTER — Other Ambulatory Visit: Payer: Self-pay

## 2015-03-24 DIAGNOSIS — S83282D Other tear of lateral meniscus, current injury, left knee, subsequent encounter: Secondary | ICD-10-CM | POA: Insufficient documentation

## 2015-03-24 DIAGNOSIS — Z01818 Encounter for other preprocedural examination: Secondary | ICD-10-CM | POA: Insufficient documentation

## 2015-03-24 LAB — BASIC METABOLIC PANEL
Anion gap: 9 (ref 5–15)
BUN: 13 mg/dL (ref 6–20)
CALCIUM: 8.9 mg/dL (ref 8.9–10.3)
CO2: 22 mmol/L (ref 22–32)
CREATININE: 1.75 mg/dL — AB (ref 0.61–1.24)
Chloride: 104 mmol/L (ref 101–111)
GFR calc non Af Amer: 48 mL/min — ABNORMAL LOW (ref >60)
GFR, EST AFRICAN AMERICAN: 55 mL/min — AB (ref >60)
Glucose, Bld: 111 mg/dL — ABNORMAL HIGH (ref 65–99)
Potassium: 4.6 mmol/L (ref 3.5–5.1)
Sodium: 135 mmol/L (ref 135–145)

## 2015-03-25 ENCOUNTER — Other Ambulatory Visit: Payer: Self-pay | Admitting: Internal Medicine

## 2015-03-25 NOTE — Progress Notes (Signed)
I have reviewed Dr. Julianne Rice note.  Patient referred to pharmacy for Xarelto management peri-procedurally.

## 2015-03-25 NOTE — Telephone Encounter (Signed)
Pt called requesting pain meds to be filled. °

## 2015-03-26 ENCOUNTER — Ambulatory Visit (HOSPITAL_BASED_OUTPATIENT_CLINIC_OR_DEPARTMENT_OTHER): Payer: No Typology Code available for payment source | Admitting: Certified Registered"

## 2015-03-26 ENCOUNTER — Encounter (HOSPITAL_BASED_OUTPATIENT_CLINIC_OR_DEPARTMENT_OTHER)
Admission: RE | Disposition: A | Discharge status: Home / Self Care | Payer: Self-pay | Source: Ambulatory Visit | Attending: Orthopaedic Surgery

## 2015-03-26 ENCOUNTER — Encounter (HOSPITAL_BASED_OUTPATIENT_CLINIC_OR_DEPARTMENT_OTHER): Payer: Self-pay | Admitting: Certified Registered"

## 2015-03-26 ENCOUNTER — Ambulatory Visit (HOSPITAL_BASED_OUTPATIENT_CLINIC_OR_DEPARTMENT_OTHER): Payer: Self-pay | Admitting: Certified Registered"

## 2015-03-26 ENCOUNTER — Ambulatory Visit (HOSPITAL_BASED_OUTPATIENT_CLINIC_OR_DEPARTMENT_OTHER)
Admission: RE | Admit: 2015-03-26 | Discharge: 2015-03-27 | Disposition: A | Discharge status: Home / Self Care | Payer: Self-pay | Source: Ambulatory Visit | Attending: Orthopaedic Surgery | Admitting: Orthopaedic Surgery

## 2015-03-26 DIAGNOSIS — I10 Essential (primary) hypertension: Secondary | ICD-10-CM | POA: Insufficient documentation

## 2015-03-26 DIAGNOSIS — M1712 Unilateral primary osteoarthritis, left knee: Secondary | ICD-10-CM | POA: Insufficient documentation

## 2015-03-26 DIAGNOSIS — Z883 Allergy status to other anti-infective agents status: Secondary | ICD-10-CM | POA: Insufficient documentation

## 2015-03-26 DIAGNOSIS — Z91030 Bee allergy status: Secondary | ICD-10-CM | POA: Insufficient documentation

## 2015-03-26 DIAGNOSIS — Z91018 Allergy to other foods: Secondary | ICD-10-CM | POA: Insufficient documentation

## 2015-03-26 DIAGNOSIS — K219 Gastro-esophageal reflux disease without esophagitis: Secondary | ICD-10-CM | POA: Insufficient documentation

## 2015-03-26 DIAGNOSIS — E785 Hyperlipidemia, unspecified: Secondary | ICD-10-CM | POA: Insufficient documentation

## 2015-03-26 DIAGNOSIS — F419 Anxiety disorder, unspecified: Secondary | ICD-10-CM | POA: Insufficient documentation

## 2015-03-26 DIAGNOSIS — D6851 Activated protein C resistance: Secondary | ICD-10-CM | POA: Insufficient documentation

## 2015-03-26 DIAGNOSIS — F329 Major depressive disorder, single episode, unspecified: Secondary | ICD-10-CM | POA: Insufficient documentation

## 2015-03-26 DIAGNOSIS — J45909 Unspecified asthma, uncomplicated: Secondary | ICD-10-CM | POA: Insufficient documentation

## 2015-03-26 DIAGNOSIS — M65862 Other synovitis and tenosynovitis, left lower leg: Secondary | ICD-10-CM | POA: Insufficient documentation

## 2015-03-26 DIAGNOSIS — S83252A Bucket-handle tear of lateral meniscus, current injury, left knee, initial encounter: Secondary | ICD-10-CM | POA: Insufficient documentation

## 2015-03-26 DIAGNOSIS — Z9889 Other specified postprocedural states: Secondary | ICD-10-CM

## 2015-03-26 DIAGNOSIS — Z86711 Personal history of pulmonary embolism: Secondary | ICD-10-CM | POA: Insufficient documentation

## 2015-03-26 DIAGNOSIS — Z79899 Other long term (current) drug therapy: Secondary | ICD-10-CM | POA: Insufficient documentation

## 2015-03-26 DIAGNOSIS — Z6841 Body Mass Index (BMI) 40.0 and over, adult: Secondary | ICD-10-CM | POA: Insufficient documentation

## 2015-03-26 DIAGNOSIS — F17210 Nicotine dependence, cigarettes, uncomplicated: Secondary | ICD-10-CM | POA: Insufficient documentation

## 2015-03-26 DIAGNOSIS — Z91013 Allergy to seafood: Secondary | ICD-10-CM | POA: Insufficient documentation

## 2015-03-26 DIAGNOSIS — G4730 Sleep apnea, unspecified: Secondary | ICD-10-CM | POA: Insufficient documentation

## 2015-03-26 DIAGNOSIS — X58XXXA Exposure to other specified factors, initial encounter: Secondary | ICD-10-CM | POA: Insufficient documentation

## 2015-03-26 DIAGNOSIS — Z7902 Long term (current) use of antithrombotics/antiplatelets: Secondary | ICD-10-CM | POA: Insufficient documentation

## 2015-03-26 DIAGNOSIS — M94262 Chondromalacia, left knee: Secondary | ICD-10-CM | POA: Insufficient documentation

## 2015-03-26 DIAGNOSIS — Z886 Allergy status to analgesic agent status: Secondary | ICD-10-CM | POA: Insufficient documentation

## 2015-03-26 HISTORY — PX: CHONDROPLASTY: SHX5177

## 2015-03-26 HISTORY — PX: KNEE ARTHROSCOPY WITH LATERAL MENISECTOMY: SHX6193

## 2015-03-26 HISTORY — DX: Activated protein C resistance: D68.51

## 2015-03-26 LAB — POCT HEMOGLOBIN-HEMACUE: Hemoglobin: 17.6 g/dL — ABNORMAL HIGH (ref 13.0–17.0)

## 2015-03-26 SURGERY — CHONDROPLASTY
Anesthesia: General | Site: Knee | Laterality: Left

## 2015-03-26 MED ORDER — METOCLOPRAMIDE HCL 5 MG/ML IJ SOLN: 5.0000 mg | Freq: Three times a day (TID) | INTRAMUSCULAR | Status: DC | PRN

## 2015-03-26 MED ORDER — BUPIVACAINE HCL (PF) 0.5 % IJ SOLN
INTRAMUSCULAR | Status: AC
  Filled 2015-03-26: qty 30

## 2015-03-26 MED ORDER — CEFAZOLIN SODIUM 1-5 GM-% IV SOLN
INTRAVENOUS | Status: AC
  Filled 2015-03-26: qty 50

## 2015-03-26 MED ORDER — METHOCARBAMOL 500 MG PO TABS
500.0000 mg | ORAL_TABLET | Freq: Four times a day (QID) | ORAL | Status: DC | PRN
  Administered 2015-03-26: 500 mg via ORAL
  Filled 2015-03-26: qty 1

## 2015-03-26 MED ORDER — METHOCARBAMOL 1000 MG/10ML IJ SOLN: 500.0000 mg | Freq: Four times a day (QID) | INTRAVENOUS | Status: DC | PRN

## 2015-03-26 MED ORDER — MIDAZOLAM HCL 2 MG/2ML IJ SOLN
1.0000 mg | INTRAMUSCULAR | Status: DC | PRN
  Administered 2015-03-26: 2 mg via INTRAVENOUS

## 2015-03-26 MED ORDER — LISINOPRIL-HYDROCHLOROTHIAZIDE 20-25 MG PO TABS: 1.0000 | ORAL_TABLET | Freq: Every day | ORAL | Status: DC

## 2015-03-26 MED ORDER — OXYCODONE HCL 5 MG PO TABS
5.0000 mg | ORAL_TABLET | Freq: Once | ORAL | Status: AC | PRN
  Administered 2015-03-26: 5 mg via ORAL
  Filled 2015-03-26: qty 1

## 2015-03-26 MED ORDER — FENTANYL CITRATE (PF) 100 MCG/2ML IJ SOLN
50.0000 ug | INTRAMUSCULAR | Status: AC | PRN
  Administered 2015-03-26 (×4): 25 ug via INTRAVENOUS
  Administered 2015-03-26: 100 ug via INTRAVENOUS
  Administered 2015-03-26: 25 ug via INTRAVENOUS

## 2015-03-26 MED ORDER — ONDANSETRON HCL 4 MG/2ML IJ SOLN: 4.0000 mg | Freq: Four times a day (QID) | INTRAMUSCULAR | Status: DC | PRN

## 2015-03-26 MED ORDER — SODIUM CHLORIDE 0.9 % IV SOLN
INTRAVENOUS | Status: DC
  Administered 2015-03-26: 11:00:00 via INTRAVENOUS

## 2015-03-26 MED ORDER — HYDROCODONE-ACETAMINOPHEN 7.5-325 MG PO TABS: 1.0000 | ORAL_TABLET | Freq: Four times a day (QID) | ORAL | Status: DC | PRN

## 2015-03-26 MED ORDER — CEFAZOLIN SODIUM-DEXTROSE 2-3 GM-% IV SOLR
INTRAVENOUS | Status: AC
  Filled 2015-03-26: qty 50

## 2015-03-26 MED ORDER — ONDANSETRON HCL 4 MG PO TABS: 4.0000 mg | ORAL_TABLET | Freq: Four times a day (QID) | ORAL | Status: DC | PRN

## 2015-03-26 MED ORDER — CEFAZOLIN SODIUM-DEXTROSE 2-3 GM-% IV SOLR
2.0000 g | Freq: Four times a day (QID) | INTRAVENOUS | Status: AC
  Administered 2015-03-26 – 2015-03-27 (×3): 2 g via INTRAVENOUS
  Filled 2015-03-26 (×3): qty 50

## 2015-03-26 MED ORDER — ACETAMINOPHEN 325 MG PO TABS: 650.0000 mg | ORAL_TABLET | Freq: Four times a day (QID) | ORAL | Status: DC | PRN

## 2015-03-26 MED ORDER — FENTANYL CITRATE (PF) 100 MCG/2ML IJ SOLN
INTRAMUSCULAR | Status: AC
  Filled 2015-03-26: qty 4

## 2015-03-26 MED ORDER — CITALOPRAM HYDROBROMIDE 20 MG PO TABS: 20.0000 mg | ORAL_TABLET | Freq: Every day | ORAL | Status: DC

## 2015-03-26 MED ORDER — DIPHENHYDRAMINE HCL 12.5 MG/5ML PO ELIX: 25.0000 mg | ORAL_SOLUTION | ORAL | Status: DC | PRN

## 2015-03-26 MED ORDER — ACETAMINOPHEN 650 MG RE SUPP: 650.0000 mg | Freq: Four times a day (QID) | RECTAL | Status: DC | PRN

## 2015-03-26 MED ORDER — PROPOFOL 10 MG/ML IV BOLUS
INTRAVENOUS | Status: DC | PRN
  Administered 2015-03-26: 260 mg via INTRAVENOUS

## 2015-03-26 MED ORDER — HYDRALAZINE HCL 10 MG PO TABS
10.0000 mg | ORAL_TABLET | Freq: Three times a day (TID) | ORAL | Status: DC
Start: 2015-03-26 — End: 2015-03-27

## 2015-03-26 MED ORDER — HYDROCHLOROTHIAZIDE 25 MG PO TABS
25.0000 mg | ORAL_TABLET | Freq: Once | ORAL | Status: AC
  Administered 2015-03-26: 25 mg via ORAL
  Filled 2015-03-26: qty 1

## 2015-03-26 MED ORDER — SODIUM CHLORIDE 0.9 % IR SOLN
Status: DC | PRN
  Administered 2015-03-26: 6000 mL

## 2015-03-26 MED ORDER — LISINOPRIL 20 MG PO TABS
20.0000 mg | ORAL_TABLET | Freq: Once | ORAL | Status: AC
  Administered 2015-03-26: 20 mg via ORAL
  Filled 2015-03-26: qty 1

## 2015-03-26 MED ORDER — DEXAMETHASONE SODIUM PHOSPHATE 10 MG/ML IJ SOLN
INTRAMUSCULAR | Status: DC | PRN
  Administered 2015-03-26: 10 mg via INTRAVENOUS

## 2015-03-26 MED ORDER — HYDROCODONE-ACETAMINOPHEN 5-325 MG PO TABS
1.0000 | ORAL_TABLET | ORAL | Status: DC | PRN
  Administered 2015-03-26 – 2015-03-27 (×4): 2 via ORAL
  Filled 2015-03-26 (×4): qty 2

## 2015-03-26 MED ORDER — LIDOCAINE HCL (CARDIAC) 20 MG/ML IV SOLN
INTRAVENOUS | Status: DC | PRN
  Administered 2015-03-26: 60 mg via INTRAVENOUS

## 2015-03-26 MED ORDER — LACTATED RINGERS IV SOLN
INTRAVENOUS | Status: DC
  Administered 2015-03-26 (×2): via INTRAVENOUS

## 2015-03-26 MED ORDER — BUPIVACAINE HCL (PF) 0.5 % IJ SOLN
INTRAMUSCULAR | Status: DC | PRN
  Administered 2015-03-26: 20 mL

## 2015-03-26 MED ORDER — METOCLOPRAMIDE HCL 5 MG PO TABS: 5.0000 mg | ORAL_TABLET | Freq: Three times a day (TID) | ORAL | Status: DC | PRN

## 2015-03-26 MED ORDER — ISOSORBIDE MONONITRATE ER 30 MG PO TB24: 30.0000 mg | ORAL_TABLET | Freq: Every day | ORAL | Status: DC

## 2015-03-26 MED ORDER — ONDANSETRON HCL 4 MG/2ML IJ SOLN
INTRAMUSCULAR | Status: DC | PRN
Start: 2015-03-26 — End: 2015-03-26
  Administered 2015-03-26: 4 mg via INTRAVENOUS

## 2015-03-26 MED ORDER — OXYCODONE HCL 5 MG/5ML PO SOLN: 5.0000 mg | Freq: Once | ORAL | Status: AC | PRN

## 2015-03-26 MED ORDER — MIDAZOLAM HCL 2 MG/2ML IJ SOLN
INTRAMUSCULAR | Status: AC
  Filled 2015-03-26: qty 2

## 2015-03-26 MED ORDER — PROPOFOL 500 MG/50ML IV EMUL
INTRAVENOUS | Status: AC
  Filled 2015-03-26: qty 50

## 2015-03-26 MED ORDER — DEXTROSE 5 % IV SOLN
3.0000 g | INTRAVENOUS | Status: AC
  Administered 2015-03-26: 3 g via INTRAVENOUS

## 2015-03-26 MED ORDER — FENTANYL CITRATE (PF) 100 MCG/2ML IJ SOLN: 25.0000 ug | INTRAMUSCULAR | Status: DC | PRN

## 2015-03-26 MED ORDER — ONDANSETRON HCL 4 MG/2ML IJ SOLN: 4.0000 mg | Freq: Once | INTRAMUSCULAR | Status: DC | PRN

## 2015-03-26 MED ORDER — GLYCOPYRROLATE 0.2 MG/ML IJ SOLN: 0.2000 mg | Freq: Once | INTRAMUSCULAR | Status: DC | PRN

## 2015-03-26 MED ORDER — HYDROCODONE-ACETAMINOPHEN 7.5-325 MG PO TABS: 1.0000 | ORAL_TABLET | ORAL | Status: DC | PRN

## 2015-03-26 MED ORDER — RIVAROXABAN 20 MG PO TABS
20.0000 mg | ORAL_TABLET | Freq: Every day | ORAL | Status: DC
  Administered 2015-03-26: 20 mg via ORAL
  Filled 2015-03-26: qty 1

## 2015-03-26 SURGICAL SUPPLY — 41 items
BANDAGE ELASTIC 6 VELCRO ST LF (GAUZE/BANDAGES/DRESSINGS) ×8 IMPLANT
BANDAGE ESMARK 6X9 LF (GAUZE/BANDAGES/DRESSINGS) IMPLANT
BLADE 4.2CUDA (BLADE) ×4 IMPLANT
BLADE CUDA GRT WHITE 3.5 (BLADE) IMPLANT
BLADE CUDA SHAVER 3.5 (BLADE) IMPLANT
BLADE CUTTER GATOR 3.5 (BLADE) IMPLANT
BNDG CMPR 9X6 STRL LF SNTH (GAUZE/BANDAGES/DRESSINGS)
BNDG COHESIVE 4X5 TAN STRL (GAUZE/BANDAGES/DRESSINGS) ×3 IMPLANT
BNDG ESMARK 6X9 LF (GAUZE/BANDAGES/DRESSINGS)
CUFF TOURNIQUET SINGLE 34IN LL (TOURNIQUET CUFF) IMPLANT
CUFF TOURNIQUET SINGLE 44IN (TOURNIQUET CUFF) ×3 IMPLANT
DRAPE ARTHROSCOPY W/POUCH 90 (DRAPES) ×4 IMPLANT
DRAPE SURG 17X23 STRL (DRAPES) ×8 IMPLANT
DURAPREP 26ML APPLICATOR (WOUND CARE) ×1 IMPLANT
ELECT MENISCUS 165MM 90D (ELECTRODE) IMPLANT
ELECT REM PT RETURN 9FT ADLT (ELECTROSURGICAL)
ELECTRODE REM PT RTRN 9FT ADLT (ELECTROSURGICAL) IMPLANT
GAUZE SPONGE 4X4 12PLY STRL (GAUZE/BANDAGES/DRESSINGS) ×4 IMPLANT
GAUZE XEROFORM 1X8 LF (GAUZE/BANDAGES/DRESSINGS) ×4 IMPLANT
GLOVE BIOGEL PI IND STRL 7.0 (GLOVE) ×1 IMPLANT
GLOVE BIOGEL PI INDICATOR 7.0 (GLOVE) ×2
GLOVE ECLIPSE 6.5 STRL STRAW (GLOVE) ×3 IMPLANT
GLOVE NEODERM STRL 7.5 LF PF (GLOVE) ×2 IMPLANT
GLOVE SURG NEODERM 7.5  LF PF (GLOVE) ×2
GLOVE SURG SYN 7.5  E (GLOVE) ×2
GLOVE SURG SYN 7.5 E (GLOVE) ×2 IMPLANT
GLOVE SURG SYN 7.5 PF PI (GLOVE) ×1 IMPLANT
GOWN STRL REIN XL XLG (GOWN DISPOSABLE) ×4 IMPLANT
GOWN STRL REUS W/ TWL LRG LVL3 (GOWN DISPOSABLE) ×2 IMPLANT
GOWN STRL REUS W/TWL LRG LVL3 (GOWN DISPOSABLE) ×4
KNEE WRAP E Z 3 GEL PACK (MISCELLANEOUS) ×4 IMPLANT
MANIFOLD NEPTUNE II (INSTRUMENTS) ×4 IMPLANT
PACK ARTHROSCOPY DSU (CUSTOM PROCEDURE TRAY) ×4 IMPLANT
PACK BASIN DAY SURGERY FS (CUSTOM PROCEDURE TRAY) ×4 IMPLANT
SET ARTHROSCOPY TUBING (MISCELLANEOUS) ×8
SET ARTHROSCOPY TUBING LN (MISCELLANEOUS) ×3 IMPLANT
SLEEVE SCD COMPRESS KNEE MED (MISCELLANEOUS) ×4 IMPLANT
SUT ETHILON 3 0 PS 1 (SUTURE) ×4 IMPLANT
TOWEL OR 17X24 6PK STRL BLUE (TOWEL DISPOSABLE) ×4 IMPLANT
TOWEL OR NON WOVEN STRL DISP B (DISPOSABLE) IMPLANT
WATER STERILE IRR 1000ML POUR (IV SOLUTION) ×4 IMPLANT

## 2015-03-26 NOTE — Op Note (Signed)
   Surgery Date: 03/26/2015  Surgeon(s): Naiping Ephriam Jenkins, MD  ANESTHESIA:  general  FLUIDS: Per anesthesia record.   ESTIMATED BLOOD LOSS: * No blood loss amount entered *  PREOPERATIVE DIAGNOSES:  1. Left knee lateral meniscus tear 2. Left knee synovitis  POSTOPERATIVE DIAGNOSES:  1. Left knee lateral meniscus tear 2. Left knee synovitis 3. Left knee chondromalacia lateral femoral condyle 4. Left knee chondromalacia lateral tibial plateau  PROCEDURES PERFORMED:  1. Left knee arthroscopy with extensive synovectomy 2. Left knee arthroscopy with arthroscopic partial lateral meniscectomy 3. Left knee arthroscopy with arthroscopic chondroplasty lateral femoral condyle and lateral tibial plateau.   DESCRIPTION OF PROCEDURE: Donald Brady is a 39 y.o.-year-old male with Left Left knee lateral meniscus tear. Plans are to proceed with partial lateral meniscectomy and diagnostic arthroscopy with debridement as indicated. Full discussion held regarding risks benefits alternatives and complications related surgical intervention. Conservative care options reviewed. All questions answered.  The patient was identified in the preoperative holding area and the operative extremity was marked. The patient was brought to the operating room and transferred to operating table in a supine position. Satisfactory general anesthesia was induced by Anesthesiology. The patient's Left knee was examined under anesthesia. Range of motion without limitation symmetric to contralateral side. Lachman exam, grade stable, grade stable pivot shift. No significant varus or valgus instability at 0 and 30 degrees. Negative posterior drawer, negative posterolateral drawer.   Standard anterolateral, anteromedial arthroscopy portals were obtained. The anteromedial portal was obtained with a spinal needle for localization under direct visualization with subsequent diagnostic findings.   Anteromedial and anterolateral chambers:  moderate synovitis. The synovitis was debrided with a 4.5 mm full radius shaver through both the anteromedial and lateral portals.   Suprapatellar pouch and gutters: moderate synovitis or debris. Patella chondral surface: Grade 0 Trochlear chondral surface: Grade 0 Patellofemoral tracking: normal Medial meniscus: normal.  Medial femoral condyle flexion bearing surface: Grade 0 Medial femoral condyle extension bearing surface: Grade 0 Medial tibial plateau: Grade 0 Anterior cruciate ligament:stable Posterior cruciate ligament:stable Lateral meniscus: bucket handle tear.   Lateral femoral condyle flexion bearing surface: Grade 4 Lateral femoral condyle extension bearing surface: Grade 4 Lateral tibial plateau: Grade 4  After completion of synovectomy, diagnostic exam, and debridements as described, all compartments were checked and no residual debris remained. Hemostasis was achieved with the cautery wand. The portals were approximated with interrupted nylon suture. All excess fluid was expressed from the joint. The portals were approximated with interrupted nylon suture. Xeroform sterile gauze dressings were applied followed by Ace bandage and ice pack.   DISPOSITION: The patient was awakened from general anesthetic, extubated, taken to the recovery room in medically stable condition, no apparent complications. The patient may be weightbearing as tolerated in Left Left lower extremity.  Range of motion of right knee as tolerated.

## 2015-03-26 NOTE — Anesthesia Postprocedure Evaluation (Signed)
  Anesthesia Post-op Note  Patient: Donald Brady  Procedure(s) Performed: Procedure(s): CHONDROPLASTY (Left) LEFT KNEE ARTHROSCOPY WITH LATERAL MENISECTOMY  CHONDROPLASTY (Left)  Patient Location: PACU  Anesthesia Type:General  Level of Consciousness: awake, alert  and oriented  Airway and Oxygen Therapy: Patient Spontanous Breathing and Patient connected to nasal cannula oxygen  Post-op Pain: none  Post-op Assessment: Post-op Vital signs reviewed, Patient's Cardiovascular Status Stable, Respiratory Function Stable, Patent Airway and Pain level controlled LLE Motor Response: Purposeful movement LLE Sensation: Full sensation          Post-op Vital Signs: stable  Last Vitals:  Filed Vitals:   03/26/15 1028  BP: 144/96  Pulse: 81  Temp: 37.1 C  Resp: 20    Complications: No apparent anesthesia complications

## 2015-03-26 NOTE — H&P (Signed)
PREOPERATIVE H&P  Chief Complaint: left knee lateral meniscal tear  HPI: Donald Brady is a 39 y.o. male who presents for surgical treatment of left knee lateral meniscal tear.  He denies any changes in medical history.  Past Medical History  Diagnosis Date  . Anxiety   . HLD (hyperlipidemia)   . History of seizures     no known cause, per pt. - has been "years" since last seizure; no longer on anticonvulsant  . Arthritis     left knee  . GERD (gastroesophageal reflux disease)   . History of pulmonary embolus (PE)     takes Xarelto  . Asthma     no inhaler use in 1 year  . Lateral meniscus tear 01/2015    left knee  . Hypertension     states BP "runs high"; has been on med. "a while"  . Sleep apnea     had sleep study 10/2013:  "severe" sleep apnea, states could not afford CPAP machine  . Factor V Leiden    Past Surgical History  Procedure Laterality Date  . Video bronchoscopy Bilateral 12/06/2012    Procedure: VIDEO BRONCHOSCOPY WITHOUT FLUORO;  Surgeon: Juanito Doom, MD;  Location: [facility];  Service: Cardiopulmonary;  Laterality: Bilateral;  . Orif femur fracture Left   . Hand surgery Right     states knuckle removed  . Esophagogastroduodenoscopy (egd) with propofol  07/10/2014  . Cardiac catheterization N/A 02/19/2015    Procedure: Left Heart Cath and Coronary Angiography;  Surgeon: Peter M Martinique, MD;  Location: [facility];  Service: Cardiovascular;  Laterality: N/A;   Social History   Social History  . Marital Status: Single    Spouse Name: N/A  . Number of Children: 1  . Years of Education: N/A   Occupational History  . Disabled- Architect   .  Other   Social History Main Topics  . Smoking status: Current Some Day Smoker -- 0.00 packs/day for 21 years    Types: Cigarettes  . Smokeless tobacco: Never Used     Comment: 1/2 pack/week  . Alcohol Use: 0.0 oz/week    0 Standard drinks or equivalent per week     Comment: weekends  . Drug  Use: No     Comment: no marijuana in 3 weeks  . Sexual Activity: Not Asked   Other Topics Concern  . None   Social History Narrative   Lives in [place] with his dog.  Has a 87 year old daughter that he sees daily.  Has good relationship with daughter's mom.  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    Family History  Problem Relation Age of Onset  . Diabetes Mother   . Hypertension Mother   . Hearing loss Father     died in his 68's  . Diabetes Maternal Aunt   . Hypertension Maternal Aunt   . Diabetes Maternal Uncle   . Hypertension Maternal Uncle   . Cancer Father     unknown type  . Stomach cancer Maternal Uncle   . Lung cancer Paternal Barbaraann Rondo     was a smoker  . Breast cancer Maternal Aunt   . Stomach cancer Maternal Uncle   . Clotting disorder Father   . Heart disease Father   . Liver disease Maternal Uncle     drinker  . Kidney disease Maternal Aunt    Allergies  Allergen Reactions  . Bee  Venom Anaphylaxis  . Honey Anaphylaxis    RAW HONEY  . Shellfish Allergy Shortness Of Breath and Swelling  . Ibuprofen Hives and Swelling  . Betadine [Povidone Iodine]     BECAUSE OF SHELLFISH ALLERGY   Prior to Admission medications   Medication Sig Start Date End Date Taking? Authorizing Provider  citalopram (CELEXA) 20 MG tablet Take 1 tablet (20 mg total) by mouth daily. 12/23/14  Yes Alexa Sherral Hammers, MD  hydrALAZINE (APRESOLINE) 10 MG tablet Take 1 tablet (10 mg total) by mouth 3 (three) times daily. 02/19/15  Yes Peter M Martinique, MD  HYDROcodone-acetaminophen (NORCO) 5-325 MG per tablet Take 1-2 tablets by mouth every 6 (six) hours as needed for moderate pain. 02/26/15  Yes Alexa Sherral Hammers, MD  lisinopril-hydrochlorothiazide (PRINZIDE,ZESTORETIC) 20-25 MG per tablet Take 1 tablet by mouth daily. 11/25/14  Yes Lucious Groves, DO  Multiple Vitamins-Minerals (MULTIVITAMINS THER. W/MINERALS) TABS Take 1 tablet by mouth daily.   Yes Historical Provider,  MD  ranitidine (ZANTAC) 150 MG tablet Take 1 tablet (150 mg total) by mouth 2 (two) times daily. 10/09/14  Yes Alexa Sherral Hammers, MD  rivaroxaban (XARELTO) 20 MG TABS tablet Take 1 tablet (20 mg total) by mouth daily with supper. 02/26/15  Yes Madilyn Fireman, MD  isosorbide mononitrate (IMDUR) 30 MG 24 hr tablet Take 1 tablet (30 mg total) by mouth daily. 02/19/15   Peter M Martinique, MD     Positive ROS: All other systems have been reviewed and were otherwise negative with the exception of those mentioned in the HPI and as above.  Physical Exam: General: Alert, no acute distress Cardiovascular: No pedal edema Respiratory: No cyanosis, no use of accessory musculature GI: abdomen soft Skin: No lesions in the area of chief complaint Neurologic: Sensation intact distally Psychiatric: Patient is competent for consent with normal mood and affect Lymphatic: no lymphedema  MUSCULOSKELETAL: exam stable  Assessment: left knee lateral meniscal tear  Plan: Plan for Procedure(s): LEFT KNEE ARTHROSCOPY WITH PARTIAL LATERAL MENISCECTOMY, CHONDROPLASTY  The risks benefits and alternatives were discussed with the patient including but not limited to the risks of nonoperative treatment, versus surgical intervention including infection, bleeding, nerve injury,  blood clots, cardiopulmonary complications, morbidity, mortality, among others, and they were willing to proceed.   Marianna Payment, MD   03/26/2015 7:19 AM

## 2015-03-26 NOTE — Anesthesia Procedure Notes (Signed)
Procedure Name: LMA Insertion Date/Time: 03/26/2015 8:30 AM Performed by: Aarish Rockers D Pre-anesthesia Checklist: Patient identified, Emergency Drugs available, Suction available and Patient being monitored Patient Re-evaluated:Patient Re-evaluated prior to inductionOxygen Delivery Method: Circle System Utilized Preoxygenation: Pre-oxygenation with 100% oxygen Intubation Type: IV induction Ventilation: Mask ventilation without difficulty LMA: LMA inserted LMA Size: 4.0 Number of attempts: 1 Airway Equipment and Method: Bite block Placement Confirmation: positive ETCO2 Tube secured with: Tape Dental Injury: Teeth and Oropharynx as per pre-operative assessment

## 2015-03-26 NOTE — Discharge Instructions (Signed)
1. Remove bandage in 2 days. 2. Place bandaids on the incisions. 3. May shower on postoperative day 2. 4. Slowly increase activity as tolerated 5. Take pain medicines and stool softeners as needed. 6. Ice the knee around the clock.

## 2015-03-26 NOTE — Telephone Encounter (Signed)
Pt request pain med refill   He is admitted today for  LEFT KNEE ARTHROSCOPY WITH LATERAL MENISECTOMY CHONDROPLASTY .  I am unable to refill at this time.  Pt called and no answer. Refill due on 8/13.  Not sure what Ortho will give pt

## 2015-03-26 NOTE — Transfer of Care (Signed)
Immediate Anesthesia Transfer of Care Note  Patient: Donald Brady  Procedure(s) Performed: Procedure(s): CHONDROPLASTY (Left) LEFT KNEE ARTHROSCOPY WITH LATERAL MENISECTOMY  CHONDROPLASTY (Left)  Patient Location: PACU  Anesthesia Type:General  Level of Consciousness: awake, alert , oriented and patient cooperative  Airway & Oxygen Therapy: Patient Spontanous Breathing and Patient connected to face mask oxygen  Post-op Assessment: Report given to RN and Post -op Vital signs reviewed and stable  Post vital signs: Reviewed and stable  Last Vitals:  Filed Vitals:   03/26/15 0719  BP: 130/90  Pulse: 78  Temp: 36.8 C  Resp: 20    Complications: No apparent anesthesia complications

## 2015-03-26 NOTE — Anesthesia Preprocedure Evaluation (Addendum)
Anesthesia Evaluation  Patient identified by MRN, date of birth, ID band Patient awake    Reviewed: Allergy & Precautions, NPO status , Patient's Chart, lab work & pertinent test results  Airway Mallampati: II  TM Distance: >3 FB Neck ROM: Full    Dental  (+) Teeth Intact, Dental Advisory Given   Pulmonary asthma , sleep apnea , Current Smoker,  Sleep study 3/16 -severe, unable to use CPAP  breath sounds clear to auscultation        Cardiovascular hypertension, Pt. on medications Rhythm:Regular Rate:Normal     Neuro/Psych PSYCHIATRIC DISORDERS Anxiety Depression    GI/Hepatic GERD-  Medicated,  Endo/Other  Morbid obesity  Renal/GU Renal disease     Musculoskeletal  (+) Arthritis -,   Abdominal   Peds  Hematology Factor 5 Leiden mutation   Anesthesia Other Findings   Reproductive/Obstetrics                            Anesthesia Physical Anesthesia Plan  ASA: III  Anesthesia Plan: General   Post-op Pain Management:    Induction: Intravenous  Airway Management Planned: LMA  Additional Equipment:   Intra-op Plan:   Post-operative Plan:   Informed Consent: I have reviewed the patients History and Physical, chart, labs and discussed the procedure including the risks, benefits and alternatives for the proposed anesthesia with the patient or authorized representative who has indicated his/her understanding and acceptance.   Dental advisory given  Plan Discussed with: CRNA and Anesthesiologist  Anesthesia Plan Comments: (MMT L. Knee Hypertension Renal insufficiency Cr 1.75 Factor V Leiden deficiency S/P PE on Xarelto, last taken 8/7 Sleep apnea not using CPAP at present No significant CAD by Cath, 6/16, EF 50-55% by ECHO  Plan GA with LMA  Roberts Gaudy)        Anesthesia Quick Evaluation

## 2015-03-27 ENCOUNTER — Encounter (HOSPITAL_BASED_OUTPATIENT_CLINIC_OR_DEPARTMENT_OTHER): Payer: Self-pay | Admitting: Orthopaedic Surgery

## 2015-03-27 ENCOUNTER — Ambulatory Visit: Payer: No Typology Code available for payment source | Admitting: Cardiology

## 2015-03-27 ENCOUNTER — Telehealth: Payer: Self-pay | Admitting: *Deleted

## 2015-03-27 NOTE — Telephone Encounter (Signed)
Agree with plan as noted in Helen's note.

## 2015-03-27 NOTE — Telephone Encounter (Signed)
I agree, thank you.

## 2015-03-27 NOTE — Telephone Encounter (Signed)
Discussed with Donald Brady.  Refill available from his pain contracted medication (with Dr. Sherrine Maples, reviewed last note).  Advised to void surgery Rx and use refill from our clinic.  If pain not managed with this number of pills, he should be seen in clinic for evaluation.

## 2015-03-27 NOTE — Telephone Encounter (Signed)
Pt presents to front desk stating he had surgery and was given a script for norco for # 90, should he use this or the one from [facility] or both. Please advise

## 2015-03-27 NOTE — Telephone Encounter (Signed)
Pt was given norco 7.5/325 from surgeon on 03/26/2015 #180, his request was not processed on 8/9 by [facility], it was held so if he is able to use the script from ortho this month that would take care of his request, he is cautioned that he is to use the script from ortho this month only, he will not receive a script from [facility] for this month and within 4 weeks he will need to see his pcp for eval preferably after his ortho f/u. He is agreeable with the plan and will call for appt for after ortho f/u

## 2015-04-02 ENCOUNTER — Encounter: Payer: No Typology Code available for payment source | Admitting: Internal Medicine

## 2015-04-23 ENCOUNTER — Other Ambulatory Visit: Payer: Self-pay | Admitting: Internal Medicine

## 2015-04-23 DIAGNOSIS — F1490 Cocaine use, unspecified, uncomplicated: Secondary | ICD-10-CM

## 2015-04-23 NOTE — Telephone Encounter (Signed)
Pt called requesting hydrocodone to be filled.  °

## 2015-04-23 NOTE — Telephone Encounter (Signed)
Last filled 8/23 #30 from surgeon at [facility] op pharm Wants enough to last til appt 9/23 w/ dr Marvel Plan or a full script which ever dr Marvel Plan is comfortable with

## 2015-04-24 MED ORDER — HYDROCODONE-ACETAMINOPHEN 7.5-325 MG PO TABS: 1.0000 | ORAL_TABLET | Freq: Four times a day (QID) | ORAL | Status: DC | PRN

## 2015-04-24 MED ORDER — HYDROCODONE-ACETAMINOPHEN 5-325 MG PO TABS: 1.0000 | ORAL_TABLET | Freq: Four times a day (QID) | ORAL | Status: DC | PRN

## 2015-04-24 NOTE — Telephone Encounter (Signed)
I agree with filling Norco 5-325 mg 1-2 tabs Q6H prn #90 until patient can see me on 05/09/15. Patient MUST complete a urine drug screen before I will refill his pain prescription on 9/23. I have ordered a future UDS so that he may complete this at his convenience.

## 2015-04-24 NOTE — Telephone Encounter (Signed)
Pt informed

## 2015-04-25 ENCOUNTER — Other Ambulatory Visit: Payer: No Typology Code available for payment source

## 2015-04-25 DIAGNOSIS — F1490 Cocaine use, unspecified, uncomplicated: Secondary | ICD-10-CM

## 2015-05-01 ENCOUNTER — Telehealth: Payer: Self-pay | Admitting: Pharmacist

## 2015-05-01 NOTE — Telephone Encounter (Signed)
Patient reports adherence with rivaroxaban therapy and no signs or symptoms of bleeding or thromboembolism.

## 2015-05-04 LAB — COCAINE (GC/MS), URINE
BENZOYLECGONINE (GC/MS): 12440 ng/mL
COCAINE + METABOLITE: POSITIVE — AB

## 2015-05-04 LAB — PRESCRIPTION ABUSE MONITORING 17P, URINE
6-Acetylmorphine, Urine: NEGATIVE ng/mL
Amphetamine Scrn, Ur: NEGATIVE ng/mL
BARBITURATE SCREEN URINE: NEGATIVE ng/mL
BENZODIAZEPINE SCREEN, URINE: NEGATIVE ng/mL
Buprenorphine, Urine: NEGATIVE ng/mL
Carisoprodol/Meprobamate, Ur: NEGATIVE ng/mL
Creatinine(Crt), U: 94.5 mg/dL (ref 20.0–300.0)
EDDP, Urine: NEGATIVE ng/mL
Fentanyl, Urine: NEGATIVE pg/mL
MDMA Screen, Urine: NEGATIVE ng/mL
MEPERIDINE SCREEN, URINE: NEGATIVE ng/mL
METHADONE SCREEN, URINE: NEGATIVE ng/mL
Nitrite Urine, Quantitative: NEGATIVE ug/mL
OXYCODONE+OXYMORPHONE UR QL SCN: NEGATIVE ng/mL
Opiate Scrn, Ur: NEGATIVE ng/mL
PH UR, DRUG SCRN: 4.9 (ref 4.5–8.9)
Phencyclidine Qn, Ur: NEGATIVE ng/mL
Propoxyphene Scrn, Ur: NEGATIVE ng/mL
SPECIFIC GRAVITY: 1.011
Tapentadol, Urine: NEGATIVE ng/mL
Tramadol Screen, Urine: NEGATIVE ng/mL

## 2015-05-04 LAB — CANNABINOID (GC/MS), URINE
CANNABINOID UR: POSITIVE — AB
CARBOXY THC UR: 270 ng/mL

## 2015-05-05 ENCOUNTER — Encounter: Payer: Self-pay | Admitting: Internal Medicine

## 2015-05-05 ENCOUNTER — Ambulatory Visit (INDEPENDENT_AMBULATORY_CARE_PROVIDER_SITE_OTHER): Payer: No Typology Code available for payment source | Admitting: Internal Medicine

## 2015-05-05 VITALS — BP 164/114 | HR 89 | Temp 98.1°F | Ht 74.5 in | Wt 359.1 lb

## 2015-05-05 DIAGNOSIS — F1020 Alcohol dependence, uncomplicated: Secondary | ICD-10-CM

## 2015-05-05 DIAGNOSIS — I2699 Other pulmonary embolism without acute cor pulmonale: Secondary | ICD-10-CM

## 2015-05-05 DIAGNOSIS — I10 Essential (primary) hypertension: Secondary | ICD-10-CM

## 2015-05-05 DIAGNOSIS — I428 Other cardiomyopathies: Secondary | ICD-10-CM

## 2015-05-05 DIAGNOSIS — F32A Depression, unspecified: Secondary | ICD-10-CM

## 2015-05-05 DIAGNOSIS — F1490 Cocaine use, unspecified, uncomplicated: Secondary | ICD-10-CM

## 2015-05-05 DIAGNOSIS — N182 Chronic kidney disease, stage 2 (mild): Secondary | ICD-10-CM

## 2015-05-05 DIAGNOSIS — F329 Major depressive disorder, single episode, unspecified: Secondary | ICD-10-CM

## 2015-05-05 DIAGNOSIS — Z720 Tobacco use: Secondary | ICD-10-CM

## 2015-05-05 DIAGNOSIS — F1420 Cocaine dependence, uncomplicated: Secondary | ICD-10-CM

## 2015-05-05 DIAGNOSIS — Z7901 Long term (current) use of anticoagulants: Secondary | ICD-10-CM

## 2015-05-05 DIAGNOSIS — M1712 Unilateral primary osteoarthritis, left knee: Secondary | ICD-10-CM

## 2015-05-05 DIAGNOSIS — Z23 Encounter for immunization: Secondary | ICD-10-CM

## 2015-05-05 DIAGNOSIS — I129 Hypertensive chronic kidney disease with stage 1 through stage 4 chronic kidney disease, or unspecified chronic kidney disease: Secondary | ICD-10-CM

## 2015-05-05 DIAGNOSIS — F411 Generalized anxiety disorder: Secondary | ICD-10-CM

## 2015-05-05 MED ORDER — CITALOPRAM HYDROBROMIDE 40 MG PO TABS: 40.0000 mg | ORAL_TABLET | Freq: Every day | ORAL | Status: DC

## 2015-05-05 MED ORDER — HYDRALAZINE HCL 10 MG PO TABS: 10.0000 mg | ORAL_TABLET | Freq: Three times a day (TID) | ORAL | Status: DC

## 2015-05-05 MED ORDER — ISOSORBIDE MONONITRATE ER 30 MG PO TB24: 30.0000 mg | ORAL_TABLET | Freq: Every day | ORAL | Status: DC

## 2015-05-05 NOTE — Progress Notes (Signed)
Internal Medicine Clinic Attending  Case discussed with Dr. Richardson soon after the resident saw the patient.  We reviewed the resident's history and exam and pertinent patient test results.  I agree with the assessment, diagnosis, and plan of care documented in the resident's note. 

## 2015-05-05 NOTE — Progress Notes (Signed)
Subjective:    Patient ID: Donald Brady, male    DOB: 02/28/1976, 39 y.o.   MRN: [id number]  HPI [name] is a 39 yo male with PMHx of b/l PE on Xarelto, HTN, OSA, right knee osteoarthritis, GERD, CKD stage II, and substance abuse who presents for follow up for HTN and osteoarthritis. Please see problem based assessment and plan for more information.   Review of Systems  General: Denies fever, chills, fatigue Respiratory: Denies SOB, cough, DOE, chest tightness Cardiovascular: Denies chest pain and palpitations.  Gastrointestinal: Denies abdominal pain, diarrhea, constipation Musculoskeletal: Admits to left knee pain. Denies myalgias, back pain, joint swelling.  Skin: Denies pallor, rash and wounds.  Neurological: Denies dizziness, headaches, weakness, lightheadedness  Past Medical History  Diagnosis Date  . Anxiety   . HLD (hyperlipidemia)   . History of seizures     no known cause, per pt. - has been "years" since last seizure; no longer on anticonvulsant  . Arthritis     left knee  . GERD (gastroesophageal reflux disease)   . History of pulmonary embolus (PE)     takes Xarelto  . Asthma     no inhaler use in 1 year  . Lateral meniscus tear 01/2015    left knee  . Hypertension     states BP "runs high"; has been on med. "a while"  . Sleep apnea     had sleep study 10/2013:  "severe" sleep apnea, states could not afford CPAP machine  . Factor V Leiden    Outpatient Encounter Prescriptions as of 05/05/2015  Medication Sig Note  . citalopram (CELEXA) 40 MG tablet Take 1 tablet (40 mg total) by mouth daily.   . hydrALAZINE (APRESOLINE) 10 MG tablet Take 1 tablet (10 mg total) by mouth 3 (three) times daily.   . isosorbide mononitrate (IMDUR) 30 MG 24 hr tablet Take 1 tablet (30 mg total) by mouth daily.   Marland Kitchen lisinopril-hydrochlorothiazide (PRINZIDE,ZESTORETIC) 20-25 MG per tablet Take 1 tablet by mouth daily.   . ranitidine (ZANTAC) 150 MG tablet Take 1 tablet (150 mg total) by  mouth 2 (two) times daily.   . rivaroxaban (XARELTO) 20 MG TABS tablet Take 1 tablet (20 mg total) by mouth daily with supper.   . [DISCONTINUED] citalopram (CELEXA) 20 MG tablet Take 1 tablet (20 mg total) by mouth daily.   . [DISCONTINUED] hydrALAZINE (APRESOLINE) 10 MG tablet Take 1 tablet (10 mg total) by mouth 3 (three) times daily.   . [DISCONTINUED] hydrALAZINE (APRESOLINE) 10 MG tablet Take 1 tablet (10 mg total) by mouth 3 (three) times daily.   . [DISCONTINUED] HYDROcodone-acetaminophen (NORCO/VICODIN) 5-325 MG per tablet Take 1-2 tablets by mouth every 6 (six) hours as needed for moderate pain. 05/05/2015: cocaine positive  . [DISCONTINUED] isosorbide mononitrate (IMDUR) 30 MG 24 hr tablet Take 1 tablet (30 mg total) by mouth daily.   . [DISCONTINUED] isosorbide mononitrate (IMDUR) 30 MG 24 hr tablet Take 1 tablet (30 mg total) by mouth daily.   . [DISCONTINUED] Multiple Vitamins-Minerals (MULTIVITAMINS THER. W/MINERALS) TABS Take 1 tablet by mouth daily.    No facility-administered encounter medications on file as of 05/05/2015.       Objective:   Physical Exam Filed Vitals:   05/05/15 1000  BP: 164/114  Pulse: 89  Temp: 98.1 F (36.7 C)  TempSrc: Oral  Height: 6' 2.5" (1.892 m)  Weight: 359 lb 1.6 oz (162.887 kg)  SpO2: 97%   General: Vital signs reviewed.  Patient is obese, in no acute distress and cooperative with exam.  Cardiovascular: RRR, S1 normal, S2 normal, no murmurs, gallops, or rubs. Pulmonary/Chest: Clear to auscultation bilaterally, no wheezes, rales, or rhonchi. Abdominal: Soft, non-tender, non-distended, BS + Musculoskeletal: No joint deformities, erythema, or stiffness Extremities: No lower extremity edema bilaterally, pulses symmetric and intact bilaterally. Skin: Warm, dry and intact. No rashes or erythema. Psychiatric: Depressed mood and affect, tearful. Speech is normal. Cognition and memory are normal.     Assessment & Plan:   Please see problem  based assessment and plan.

## 2015-05-05 NOTE — Patient Instructions (Signed)
PLEASE FILL HYDRALAZINE 10 MG THREE TIMES A DAY FOR BLOOD PRESSURE AND FOR YOUR HEART. THIS IS MOST IMPORTANT.  PLEASE FILL IMDUR 30 MG ONE A DAY FOR YOUR HEART.  PLEASE TAKE CELEXA 40 MG FOR DEPRESSION.  GO TO [facility] FOR BOTH COUNSELING AND TO SEE A PSYCHIATRIST WHO CAN PRESCRIBE YOU MEDICATIONS OR CHANGE MEDICATIONS AS NEEDED. FIRST APPOINTMENT MUST BE WALK-IN BETWEEN 9 AM AND 3 PM, BUT YOU CAN SCHEDULE AN APPOINTMENT EVERY TIME AFTER THAT. FOLLOW UP ON IN ONE MONTH.

## 2015-05-06 NOTE — Assessment & Plan Note (Signed)
Admits to continued use of cocaine and alcohol. UDS positive for cocaine, negative for opioids. No longer prescribing pain medications for this patient.

## 2015-05-06 NOTE — Assessment & Plan Note (Signed)
Patient reports increasing depressed mood with events surrounding his daughter and feeling overwhelmed with his medical and financial situations. He reports compliance with Celexa 20 mg daily and has been going to support groups. We spent a majority of the visit discussing his depression and lifestyle changes he could make to help his mood.   Plan: -Increase Celexa to 40 mg daily -Provided information for [facility] psychiatric services -Continue support group

## 2015-05-06 NOTE — Assessment & Plan Note (Signed)
Abnormal stress test. 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 Medical therapy was recommended with lisinopril-HCTZ, hydralazine and Imdur. Outpatient echo was also recommended. Patient has not started taking this medications yet.   Plan: -Refilled Imdur and hydralazine -Obtain echo in near future

## 2015-05-06 NOTE — Assessment & Plan Note (Signed)
Patient recently underwent left knee chondroplasty on 03/26/15 for lateral meniscal tear. Patient continues to have pain, but improved from prior. Given UDS positive for cocaine and negative for opioids, we will no longer be prescribing opioids to patient. He voices understanding.  Plan: -Tylenol for pain

## 2015-05-06 NOTE — Assessment & Plan Note (Signed)
Patient reports compliance with Xarelto 20 mg daily.

## 2015-05-06 NOTE — Assessment & Plan Note (Signed)
BP Readings from Last 3 Encounters:  05/05/15 164/114  03/27/15 165/95  02/19/15 119/84    Lab Results  Component Value Date   NA 135 03/24/2015   K 4.6 03/24/2015   CREATININE 1.75* 03/24/2015    Assessment: Blood pressure control:  Uncontrolled Progress toward BP goal:   Deteriorated Comments: Patient reports compliance with lisinopril-HCTZ 20-25 mg daily, but has not picked up hydralazine 10 mg TID or Imdur 30 mg daily.   Plan: Medications:  continue current medications, refilled hydralazine and Imdur and encouraged patient to start taking these medications.  Educational resources provided: brochure, handout, video Self management tools provided:

## 2015-05-06 NOTE — Assessment & Plan Note (Signed)
Creatinine 1.75 on 03/24/15. Baseline creatinine 1.4. Likely 2/2 uncontrolled hypertension. Repeat BMET at next visit.

## 2015-05-07 ENCOUNTER — Encounter: Payer: No Typology Code available for payment source | Admitting: Internal Medicine

## 2015-07-02 ENCOUNTER — Telehealth: Payer: Self-pay | Admitting: Pharmacist

## 2015-07-02 DIAGNOSIS — Z7901 Long term (current) use of anticoagulants: Secondary | ICD-10-CM

## 2015-07-02 DIAGNOSIS — I2699 Other pulmonary embolism without acute cor pulmonale: Secondary | ICD-10-CM

## 2015-07-02 DIAGNOSIS — D6851 Activated protein C resistance: Secondary | ICD-10-CM

## 2015-07-02 MED ORDER — RIVAROXABAN 20 MG PO TABS: 20.0000 mg | ORAL_TABLET | Freq: Every day | ORAL | Status: DC

## 2015-07-02 NOTE — Telephone Encounter (Signed)
Help with medication adherence per PCP request. [facility] records show patient did not pick up hydralazine or isosorbide mononitrate (Rx's written 05/05/15). Consistent refills on lisinopril-HCTZ and citalopram.  Per [facility] outpatient pharmacy, last refill of rivaroxaban was August 2016. Per [facility], rivaroxaban not filled in 2016. Rx sent to [facility] per patient preference, patient enrolled in [employer] PAP for $0 rivaroxaban.

## 2015-07-03 ENCOUNTER — Ambulatory Visit: Payer: No Typology Code available for payment source | Admitting: Cardiology

## 2015-07-14 ENCOUNTER — Encounter (HOSPITAL_COMMUNITY): Payer: Self-pay | Admitting: *Deleted

## 2015-07-14 ENCOUNTER — Ambulatory Visit: Payer: No Typology Code available for payment source | Admitting: Internal Medicine

## 2015-07-14 ENCOUNTER — Telehealth: Payer: Self-pay

## 2015-07-14 ENCOUNTER — Emergency Department (HOSPITAL_COMMUNITY)
Admission: EM | Admit: 2015-07-14 | Discharge: 2015-07-14 | Discharge status: Against Medical Advice | Payer: No Typology Code available for payment source | Attending: Emergency Medicine | Admitting: Emergency Medicine

## 2015-07-14 DIAGNOSIS — R109 Unspecified abdominal pain: Secondary | ICD-10-CM | POA: Insufficient documentation

## 2015-07-14 DIAGNOSIS — I10 Essential (primary) hypertension: Secondary | ICD-10-CM | POA: Insufficient documentation

## 2015-07-14 DIAGNOSIS — F17210 Nicotine dependence, cigarettes, uncomplicated: Secondary | ICD-10-CM | POA: Insufficient documentation

## 2015-07-14 DIAGNOSIS — J45909 Unspecified asthma, uncomplicated: Secondary | ICD-10-CM | POA: Insufficient documentation

## 2015-07-14 LAB — CBC
HCT: 52.1 % — ABNORMAL HIGH (ref 39.0–52.0)
Hemoglobin: 17.9 g/dL — ABNORMAL HIGH (ref 13.0–17.0)
MCH: 30.6 pg (ref 26.0–34.0)
MCHC: 34.4 g/dL (ref 30.0–36.0)
MCV: 89.1 fL (ref 78.0–100.0)
PLATELETS: 251 10*3/uL (ref 150–400)
RBC: 5.85 MIL/uL — AB (ref 4.22–5.81)
RDW: 13.3 % (ref 11.5–15.5)
WBC: 7 10*3/uL (ref 4.0–10.5)

## 2015-07-14 LAB — COMPREHENSIVE METABOLIC PANEL
ALK PHOS: 69 U/L (ref 38–126)
ALT: 45 U/L (ref 17–63)
AST: 37 U/L (ref 15–41)
Albumin: 3.8 g/dL (ref 3.5–5.0)
Anion gap: 10 (ref 5–15)
BILIRUBIN TOTAL: 0.5 mg/dL (ref 0.3–1.2)
BUN: 14 mg/dL (ref 6–20)
CALCIUM: 8.9 mg/dL (ref 8.9–10.3)
CO2: 24 mmol/L (ref 22–32)
CREATININE: 1.9 mg/dL — AB (ref 0.61–1.24)
Chloride: 102 mmol/L (ref 101–111)
GFR, EST AFRICAN AMERICAN: 50 mL/min — AB (ref >60)
GFR, EST NON AFRICAN AMERICAN: 43 mL/min — AB (ref >60)
Glucose, Bld: 107 mg/dL — ABNORMAL HIGH (ref 65–99)
Potassium: 3.7 mmol/L (ref 3.5–5.1)
Sodium: 136 mmol/L (ref 135–145)
Total Protein: 7.4 g/dL (ref 6.5–8.1)

## 2015-07-14 LAB — LIPASE, BLOOD: LIPASE: 37 U/L (ref 11–51)

## 2015-07-14 NOTE — Telephone Encounter (Signed)
Inbound call to [facility] from pt stating he has not been taking any of his medications including coumadin.  He has noticed hard lumps in his abdomen that are painful.  No appointments in clinic until this afternoon.  Pt advised to report to ED or call 911.  I called to speak with pt about potential urgency with waiting to be seen, he is speaking with his mother to get a ride to ED since we do not have appointments in clinic until this afternoon.  He will call back to report he can get here.

## 2015-07-14 NOTE — ED Notes (Addendum)
Pt reports hx of blood clots. Had recent fall and now has abd pain. Denies diarrhea but did not recent vomiting. Went to pcp and sent here to r/o blood clots. Pt stopped taking xarelto one month ago. No acute distress noted at triage.

## 2015-08-12 ENCOUNTER — Telehealth: Payer: Self-pay | Admitting: Licensed Clinical Social Worker

## 2015-08-12 ENCOUNTER — Encounter: Payer: Self-pay | Admitting: Licensed Clinical Social Worker

## 2015-08-12 NOTE — Telephone Encounter (Signed)
CSW received message from pt's mother, Jahzier Lala requesting letter from PCP to assist with his food stamp redetermination.  CSW informed Ms. Gattuso pt has canceled several appointments.  Inquired if pt has been taking his medication, noted in ED that pt has not been taking medications.  Mother states they have difficulty with transportation as they have one car and mother has her own medical appointments.  Mother reports pt has all his medication and should be taking it.  CSW discussed [facility] transportation and provided mother with phone number.  CSW will complete [employer] application and fax to [place].  Mother aware this request will be forwarded to PCP.  CSW will mail information regarding [employer] transportation and current medication listing for patient.  CSW will notify mother regarding physician decision to complete letter or defer at this time.  Mother requesting letter before Jan. 9, 2017.

## 2015-08-12 NOTE — Telephone Encounter (Signed)
Thank you.  Would you like me to leave the date as a fill in by you or would you like me to specify the months in the letter.  If so, what time frame would you like the letter to reflect?

## 2015-08-12 NOTE — Telephone Encounter (Signed)
Shana,  I will be happy to sign this letter. Will you let me know when it is available for my signature and what else I need to do in order to complete it?  Thank you, Jauan Wohl

## 2015-08-12 NOTE — Telephone Encounter (Signed)
Mother, Donald Brady, provided additional information she would like PCP to be made aware that mother has a current illness and pt has had to cancel because she has been unable to transport patient to his appointments.  Mother states she can not afford to cover patient's food in addition to her food.

## 2015-08-13 NOTE — Telephone Encounter (Signed)
CSW placed call to Mr. Barrero's mother, Denman George to notify family PCP is agreeable to complete letter.  Family notified.  During this conversation, mother tearful and requesting this worker to pray for her as she has been feeling increased depressive symptoms.  CSW obtained mother's demographic information.  CSW will provide mother with resources.

## 2015-08-18 ENCOUNTER — Emergency Department (HOSPITAL_COMMUNITY)
Admission: EM | Admit: 2015-08-18 | Discharge: 2015-08-18 | Disposition: A | Discharge status: Home / Self Care | Payer: No Typology Code available for payment source | Attending: Emergency Medicine | Admitting: Emergency Medicine

## 2015-08-18 ENCOUNTER — Encounter (HOSPITAL_COMMUNITY): Payer: Self-pay

## 2015-08-18 DIAGNOSIS — Z862 Personal history of diseases of the blood and blood-forming organs and certain disorders involving the immune mechanism: Secondary | ICD-10-CM | POA: Insufficient documentation

## 2015-08-18 DIAGNOSIS — Z86711 Personal history of pulmonary embolism: Secondary | ICD-10-CM | POA: Insufficient documentation

## 2015-08-18 DIAGNOSIS — Z8659 Personal history of other mental and behavioral disorders: Secondary | ICD-10-CM | POA: Insufficient documentation

## 2015-08-18 DIAGNOSIS — Z87828 Personal history of other (healed) physical injury and trauma: Secondary | ICD-10-CM | POA: Insufficient documentation

## 2015-08-18 DIAGNOSIS — I10 Essential (primary) hypertension: Secondary | ICD-10-CM | POA: Insufficient documentation

## 2015-08-18 DIAGNOSIS — Z8639 Personal history of other endocrine, nutritional and metabolic disease: Secondary | ICD-10-CM | POA: Insufficient documentation

## 2015-08-18 DIAGNOSIS — Z8669 Personal history of other diseases of the nervous system and sense organs: Secondary | ICD-10-CM | POA: Insufficient documentation

## 2015-08-18 DIAGNOSIS — F17210 Nicotine dependence, cigarettes, uncomplicated: Secondary | ICD-10-CM | POA: Insufficient documentation

## 2015-08-18 DIAGNOSIS — M1990 Unspecified osteoarthritis, unspecified site: Secondary | ICD-10-CM | POA: Insufficient documentation

## 2015-08-18 DIAGNOSIS — K429 Umbilical hernia without obstruction or gangrene: Secondary | ICD-10-CM | POA: Insufficient documentation

## 2015-08-18 DIAGNOSIS — J45909 Unspecified asthma, uncomplicated: Secondary | ICD-10-CM | POA: Insufficient documentation

## 2015-08-18 LAB — CBC WITH DIFFERENTIAL/PLATELET
Basophils Absolute: 0.1 10*3/uL (ref 0.0–0.1)
Basophils Relative: 1 %
Eosinophils Absolute: 0.2 10*3/uL (ref 0.0–0.7)
Eosinophils Relative: 3 %
HEMATOCRIT: 49.8 % (ref 39.0–52.0)
Hemoglobin: 17 g/dL (ref 13.0–17.0)
LYMPHS ABS: 2.1 10*3/uL (ref 0.7–4.0)
LYMPHS PCT: 26 %
MCH: 30.4 pg (ref 26.0–34.0)
MCHC: 34.1 g/dL (ref 30.0–36.0)
MCV: 89.1 fL (ref 78.0–100.0)
MONO ABS: 0.8 10*3/uL (ref 0.1–1.0)
MONOS PCT: 10 %
NEUTROS ABS: 4.8 10*3/uL (ref 1.7–7.7)
Neutrophils Relative %: 60 %
Platelets: 249 10*3/uL (ref 150–400)
RBC: 5.59 MIL/uL (ref 4.22–5.81)
RDW: 13.1 % (ref 11.5–15.5)
WBC: 8 10*3/uL (ref 4.0–10.5)

## 2015-08-18 LAB — COMPREHENSIVE METABOLIC PANEL
ALT: 31 U/L (ref 17–63)
ANION GAP: 10 (ref 5–15)
AST: 25 U/L (ref 15–41)
Albumin: 3.4 g/dL — ABNORMAL LOW (ref 3.5–5.0)
Alkaline Phosphatase: 61 U/L (ref 38–126)
BILIRUBIN TOTAL: 0.6 mg/dL (ref 0.3–1.2)
BUN: 13 mg/dL (ref 6–20)
CO2: 23 mmol/L (ref 22–32)
Calcium: 9 mg/dL (ref 8.9–10.3)
Chloride: 107 mmol/L (ref 101–111)
Creatinine, Ser: 1.81 mg/dL — ABNORMAL HIGH (ref 0.61–1.24)
GFR, EST AFRICAN AMERICAN: 53 mL/min — AB (ref >60)
GFR, EST NON AFRICAN AMERICAN: 46 mL/min — AB (ref >60)
Glucose, Bld: 113 mg/dL — ABNORMAL HIGH (ref 65–99)
POTASSIUM: 4 mmol/L (ref 3.5–5.1)
Sodium: 140 mmol/L (ref 135–145)
TOTAL PROTEIN: 6.7 g/dL (ref 6.5–8.1)

## 2015-08-18 LAB — URINALYSIS, ROUTINE W REFLEX MICROSCOPIC
Bilirubin Urine: NEGATIVE
GLUCOSE, UA: NEGATIVE mg/dL
KETONES UR: NEGATIVE mg/dL
LEUKOCYTES UA: NEGATIVE
Nitrite: NEGATIVE
PROTEIN: 100 mg/dL — AB
Specific Gravity, Urine: 1.01 (ref 1.005–1.030)
pH: 5.5 (ref 5.0–8.0)

## 2015-08-18 LAB — URINE MICROSCOPIC-ADD ON: RBC / HPF: NONE SEEN RBC/hpf (ref 0–5)

## 2015-08-18 LAB — LIPASE, BLOOD: LIPASE: 26 U/L (ref 11–51)

## 2015-08-18 MED ORDER — OXYCODONE-ACETAMINOPHEN 5-325 MG PO TABS
2.0000 | ORAL_TABLET | Freq: Once | ORAL | Status: AC
  Administered 2015-08-18: 2 via ORAL
  Filled 2015-08-18: qty 2

## 2015-08-18 MED ORDER — OXYCODONE-ACETAMINOPHEN 5-325 MG PO TABS: 2.0000 | ORAL_TABLET | ORAL | Status: DC | PRN

## 2015-08-18 MED FILL — OXYCODONE/APAP 5-325: 5-325 | 1 days supply | Qty: 6 | Fill #0

## 2015-08-18 NOTE — ED Provider Notes (Signed)
CSN: [id number]     Arrival date & time 08/18/15  [id number] History   First MD Initiated Contact with Patient 08/18/15 [phone]     Chief Complaint  Patient presents with  . Abdominal Pain     (Consider location/radiation/quality/duration/timing/severity/associated sxs/prior Treatment) HPI   Donald Brady is a 40 y.o. male, with a history of anxiety, PE, hypertension, and factor V Leiden, presenting to the ED with periumbilical abdominal pain for the past two months. Pt states the pain is worse with movement, coughing, or laughing. Rates the pain at 7/10, "feels like tearing," non-radiating. Pt has not tried any medicine for it. Patient also endorses a "bulge" in his umbilicus when he tries to sit up. Pt denies abdominal surgeries. Patient has no urinary complaints and is still having regular and normal bowel movements. Denies N/V/C/D, fever/chills, anorexia, chest pain, shortness of breath, or any other pain or complaints.     Past Medical History  Diagnosis Date  . Anxiety   . HLD (hyperlipidemia)   . History of seizures     no known cause, per pt. - has been "years" since last seizure; no longer on anticonvulsant  . Arthritis     left knee  . GERD (gastroesophageal reflux disease)   . History of pulmonary embolus (PE)     takes Xarelto  . Asthma     no inhaler use in 1 year  . Lateral meniscus tear 01/2015    left knee  . Hypertension     states BP "runs high"; has been on med. "a while"  . Sleep apnea     had sleep study 10/2013:  "severe" sleep apnea, states could not afford CPAP machine  . Factor V Leiden [facility])    Past Surgical History  Procedure Laterality Date  . Video bronchoscopy Bilateral 12/06/2012    Procedure: VIDEO BRONCHOSCOPY WITHOUT FLUORO;  Surgeon: Juanito Doom, MD;  Location: [facility];  Service: Cardiopulmonary;  Laterality: Bilateral;  . Orif femur fracture Left   . Hand surgery Right     states knuckle removed  . Esophagogastroduodenoscopy (egd) with  propofol  07/10/2014  . Cardiac catheterization N/A 02/19/2015    Procedure: Left Heart Cath and Coronary Angiography;  Surgeon: Peter M Martinique, MD;  Location: [facility];  Service: Cardiovascular;  Laterality: N/A;  . Chondroplasty Left 03/26/2015    Procedure: CHONDROPLASTY;  Surgeon: Leandrew Koyanagi, MD;  Location: [facility];  Service: Orthopedics;  Laterality: Left;  . Knee arthroscopy with lateral menisectomy Left 03/26/2015    Procedure: LEFT KNEE ARTHROSCOPY WITH LATERAL MENISECTOMY  CHONDROPLASTY;  Surgeon: Leandrew Koyanagi, MD;  Location: [facility];  Service: Orthopedics;  Laterality: Left;   Family History  Problem Relation Age of Onset  . Diabetes Mother   . Hypertension Mother   . Hearing loss Father     died in his 16's  . Diabetes Maternal Aunt   . Hypertension Maternal Aunt   . Diabetes Maternal Uncle   . Hypertension Maternal Uncle   . Cancer Father     unknown type  . Stomach cancer Maternal Uncle   . Lung cancer Paternal Barbaraann Rondo     was a smoker  . Breast cancer Maternal Aunt   . Stomach cancer Maternal Uncle   . Clotting disorder Father   . Heart disease Father   . Liver disease Maternal Uncle     drinker  . Kidney disease Maternal Aunt  Social History  Substance Use Topics  . Smoking status: Current Some Day Smoker -- 0.50 packs/day for 21 years    Types: Cigarettes  . Smokeless tobacco: Never Used     Comment: 1/2 pack/week  . Alcohol Use: 0.0 oz/week    0 Standard drinks or equivalent per week     Comment: weekends    Review of Systems  Constitutional: Negative for fever, chills and diaphoresis.  Respiratory: Negative for shortness of breath.   Cardiovascular: Negative for chest pain.  Gastrointestinal: Positive for abdominal pain. Negative for nausea, vomiting, diarrhea and constipation.  Genitourinary: Negative for dysuria.  Skin: Negative for color change and pallor.  Neurological: Negative for dizziness,  light-headedness and headaches.  All other systems reviewed and are negative.     Allergies  Bee venom; Honey; Shellfish allergy; Ibuprofen; and Betadine  Home Medications   Prior to Admission medications   Medication Sig Start Date End Date Taking? Authorizing Provider  oxyCODONE-acetaminophen (PERCOCET/ROXICET) 5-325 MG tablet Take 2 tablets by mouth every 4 (four) hours as needed for severe pain. 08/18/15   Shawn C Joy, PA-C   BP 141/103 mmHg  Pulse 95  Temp(Src) 98.7 F (37.1 C) (Oral)  Resp 18  Ht 6\' 2"  (1.88 m)  Wt 167.831 kg  BMI 47.49 kg/m2  SpO2 97% Physical Exam  Constitutional: He appears well-developed and well-nourished. No distress.  HENT:  Head: Normocephalic and atraumatic.  Eyes: Conjunctivae are normal. Pupils are equal, round, and reactive to light.  Cardiovascular: Normal rate, regular rhythm and normal heart sounds.   Pulmonary/Chest: Effort normal and breath sounds normal. No respiratory distress.  Abdominal: Soft. Bowel sounds are normal. There is tenderness in the periumbilical area. A hernia is present.  Umbilical hernia appreciated with no incarceration. Easily reducible.   Musculoskeletal: He exhibits no edema or tenderness.  Lymphadenopathy:    He has no cervical adenopathy.  Neurological: He is alert.  Skin: Skin is warm and dry. He is not diaphoretic.  Nursing note and vitals reviewed.   ED Course  Procedures (including critical care time) Labs Review Labs Reviewed  COMPREHENSIVE METABOLIC PANEL - Abnormal; Notable for the following:    Glucose, Bld 113 (*)    Creatinine, Ser 1.81 (*)    Albumin 3.4 (*)    GFR calc non Af Amer 46 (*)    GFR calc Af Amer 53 (*)    All other components within normal limits  URINALYSIS, ROUTINE W REFLEX MICROSCOPIC (NOT AT [facility]) - Abnormal; Notable for the following:    Hgb urine dipstick TRACE (*)    Protein, ur 100 (*)    All other components within normal limits  URINE MICROSCOPIC-ADD ON - Abnormal;  Notable for the following:    Squamous Epithelial / LPF 0-5 (*)    Bacteria, UA RARE (*)    All other components within normal limits  CBC WITH DIFFERENTIAL/PLATELET  LIPASE, BLOOD    Imaging Review No results found. I have personally reviewed and evaluated these images and lab results as part of my medical decision-making.   EKG Interpretation None      MDM   Final diagnoses:  Umbilical hernia without obstruction and without gangrene    Si Raider presents with periumbilical pain consistent with hernia.  Findings and plan of care discussed with Virgel Manifold, MD.  Patient's presentation is consistent with a non-incarcerated easily reducible umbilical hernia. There are no signs of incarceration or strangulation. No indication of bowel obstruction. This  patient is nontoxic appearing, in no apparent distress, afebrile, is still having regular bowel movements and eating regularly. The patient was given instructions for home care as well as return precautions. Patient was also told that he would need to follow up with a surgeon outpatient as soon as possible. Patient voices understanding of these instructions, accepts the plan, and is comfortable with discharge.   Lorayne Bender, PA-C 08/18/15 1114  Virgel Manifold, MD 08/22/15 1011

## 2015-08-18 NOTE — ED Notes (Signed)
Pt ambulated to the bathroom with ease 

## 2015-08-18 NOTE — ED Notes (Signed)
Patient here with 2 months of ongoing umbilical pain, reports worse when he lays down and worse with movement

## 2015-08-18 NOTE — Discharge Instructions (Signed)
You have been seen today for an umbilical hernia. Your lab tests showed no abnormalities. Follow up with the surgeon as soon as possible for this matter. Follow up with PCP as needed. Return to ED should symptoms worsen. Use Tylenol for pain.

## 2015-08-19 ENCOUNTER — Encounter (HOSPITAL_COMMUNITY): Payer: Self-pay

## 2015-08-19 ENCOUNTER — Encounter: Payer: Self-pay | Admitting: Licensed Clinical Social Worker

## 2015-08-19 ENCOUNTER — Emergency Department (HOSPITAL_COMMUNITY): Payer: No Typology Code available for payment source

## 2015-08-19 ENCOUNTER — Observation Stay (HOSPITAL_COMMUNITY)
Admission: EM | Admit: 2015-08-19 | Discharge: 2015-08-20 | Disposition: A | Discharge status: Home / Self Care | Payer: No Typology Code available for payment source | Attending: Internal Medicine | Admitting: Internal Medicine

## 2015-08-19 DIAGNOSIS — Z86711 Personal history of pulmonary embolism: Secondary | ICD-10-CM | POA: Insufficient documentation

## 2015-08-19 DIAGNOSIS — I2699 Other pulmonary embolism without acute cor pulmonale: Secondary | ICD-10-CM | POA: Diagnosis present

## 2015-08-19 DIAGNOSIS — G4733 Obstructive sleep apnea (adult) (pediatric): Secondary | ICD-10-CM | POA: Diagnosis present

## 2015-08-19 DIAGNOSIS — J45909 Unspecified asthma, uncomplicated: Secondary | ICD-10-CM | POA: Diagnosis present

## 2015-08-19 DIAGNOSIS — Z7901 Long term (current) use of anticoagulants: Secondary | ICD-10-CM | POA: Insufficient documentation

## 2015-08-19 DIAGNOSIS — Z720 Tobacco use: Secondary | ICD-10-CM | POA: Diagnosis present

## 2015-08-19 DIAGNOSIS — F17210 Nicotine dependence, cigarettes, uncomplicated: Secondary | ICD-10-CM | POA: Insufficient documentation

## 2015-08-19 DIAGNOSIS — E669 Obesity, unspecified: Secondary | ICD-10-CM | POA: Diagnosis present

## 2015-08-19 DIAGNOSIS — N183 Chronic kidney disease, stage 3 unspecified: Secondary | ICD-10-CM | POA: Diagnosis present

## 2015-08-19 DIAGNOSIS — I2510 Atherosclerotic heart disease of native coronary artery without angina pectoris: Secondary | ICD-10-CM | POA: Insufficient documentation

## 2015-08-19 DIAGNOSIS — M1712 Unilateral primary osteoarthritis, left knee: Secondary | ICD-10-CM | POA: Insufficient documentation

## 2015-08-19 DIAGNOSIS — J45990 Exercise induced bronchospasm: Secondary | ICD-10-CM | POA: Insufficient documentation

## 2015-08-19 DIAGNOSIS — Z6841 Body Mass Index (BMI) 40.0 and over, adult: Secondary | ICD-10-CM | POA: Insufficient documentation

## 2015-08-19 DIAGNOSIS — I428 Other cardiomyopathies: Secondary | ICD-10-CM

## 2015-08-19 DIAGNOSIS — R739 Hyperglycemia, unspecified: Secondary | ICD-10-CM | POA: Insufficient documentation

## 2015-08-19 DIAGNOSIS — R0902 Hypoxemia: Secondary | ICD-10-CM

## 2015-08-19 DIAGNOSIS — J4521 Mild intermittent asthma with (acute) exacerbation: Principal | ICD-10-CM | POA: Insufficient documentation

## 2015-08-19 DIAGNOSIS — E785 Hyperlipidemia, unspecified: Secondary | ICD-10-CM | POA: Insufficient documentation

## 2015-08-19 DIAGNOSIS — R0602 Shortness of breath: Secondary | ICD-10-CM | POA: Diagnosis present

## 2015-08-19 DIAGNOSIS — K219 Gastro-esophageal reflux disease without esophagitis: Secondary | ICD-10-CM | POA: Insufficient documentation

## 2015-08-19 DIAGNOSIS — I129 Hypertensive chronic kidney disease with stage 1 through stage 4 chronic kidney disease, or unspecified chronic kidney disease: Secondary | ICD-10-CM | POA: Insufficient documentation

## 2015-08-19 DIAGNOSIS — D6851 Activated protein C resistance: Secondary | ICD-10-CM | POA: Insufficient documentation

## 2015-08-19 DIAGNOSIS — D751 Secondary polycythemia: Secondary | ICD-10-CM | POA: Diagnosis present

## 2015-08-19 DIAGNOSIS — I10 Essential (primary) hypertension: Secondary | ICD-10-CM | POA: Diagnosis present

## 2015-08-19 DIAGNOSIS — Z9114 Patient's other noncompliance with medication regimen: Secondary | ICD-10-CM | POA: Insufficient documentation

## 2015-08-19 HISTORY — DX: Reserved for inherently not codable concepts without codable children: IMO0001

## 2015-08-19 LAB — CBC WITH DIFFERENTIAL/PLATELET
BASOS ABS: 0.1 10*3/uL (ref 0.0–0.1)
BASOS PCT: 1 %
EOS ABS: 0.3 10*3/uL (ref 0.0–0.7)
Eosinophils Relative: 4 %
HCT: 52.9 % — ABNORMAL HIGH (ref 39.0–52.0)
HEMOGLOBIN: 17.9 g/dL — AB (ref 13.0–17.0)
Lymphocytes Relative: 37 %
Lymphs Abs: 2.9 10*3/uL (ref 0.7–4.0)
MCH: 30.5 pg (ref 26.0–34.0)
MCHC: 33.8 g/dL (ref 30.0–36.0)
MCV: 90.1 fL (ref 78.0–100.0)
MONOS PCT: 10 %
Monocytes Absolute: 0.8 10*3/uL (ref 0.1–1.0)
NEUTROS PCT: 48 %
Neutro Abs: 3.8 10*3/uL (ref 1.7–7.7)
Platelets: 252 10*3/uL (ref 150–400)
RBC: 5.87 MIL/uL — ABNORMAL HIGH (ref 4.22–5.81)
RDW: 13.2 % (ref 11.5–15.5)
WBC: 7.8 10*3/uL (ref 4.0–10.5)

## 2015-08-19 LAB — COMPREHENSIVE METABOLIC PANEL
ALBUMIN: 3.7 g/dL (ref 3.5–5.0)
ALK PHOS: 66 U/L (ref 38–126)
ALT: 31 U/L (ref 17–63)
ANION GAP: 11 (ref 5–15)
AST: 24 U/L (ref 15–41)
BUN: 12 mg/dL (ref 6–20)
CALCIUM: 8.7 mg/dL — AB (ref 8.9–10.3)
CO2: 24 mmol/L (ref 22–32)
Chloride: 103 mmol/L (ref 101–111)
Creatinine, Ser: 2.05 mg/dL — ABNORMAL HIGH (ref 0.61–1.24)
GFR calc Af Amer: 46 mL/min — ABNORMAL LOW (ref >60)
GFR calc non Af Amer: 39 mL/min — ABNORMAL LOW (ref >60)
GLUCOSE: 121 mg/dL — AB (ref 65–99)
POTASSIUM: 4 mmol/L (ref 3.5–5.1)
SODIUM: 138 mmol/L (ref 135–145)
Total Bilirubin: 0.5 mg/dL (ref 0.3–1.2)
Total Protein: 6.9 g/dL (ref 6.5–8.1)

## 2015-08-19 LAB — I-STAT TROPONIN, ED: Troponin i, poc: 0.03 ng/mL (ref 0.00–0.08)

## 2015-08-19 LAB — BRAIN NATRIURETIC PEPTIDE: B Natriuretic Peptide: 208.6 pg/mL — ABNORMAL HIGH (ref 0.0–100.0)

## 2015-08-19 MED ORDER — RIVAROXABAN 15 MG PO TABS
15.0000 mg | ORAL_TABLET | Freq: Once | ORAL | Status: AC
  Administered 2015-08-19: 15 mg via ORAL
  Filled 2015-08-19: qty 1

## 2015-08-19 MED ORDER — RIVAROXABAN 15 MG PO TABS
15.0000 mg | ORAL_TABLET | Freq: Two times a day (BID) | ORAL | Status: DC
  Administered 2015-08-19 – 2015-08-20 (×2): 15 mg via ORAL
  Filled 2015-08-19 (×2): qty 1

## 2015-08-19 MED ORDER — ONDANSETRON HCL 4 MG/2ML IJ SOLN: 4.0000 mg | Freq: Three times a day (TID) | INTRAMUSCULAR | Status: AC | PRN

## 2015-08-19 MED ORDER — HYDROCODONE-ACETAMINOPHEN 5-325 MG PO TABS: 1.0000 | ORAL_TABLET | ORAL | Status: DC | PRN

## 2015-08-19 MED ORDER — HYDROCHLOROTHIAZIDE 12.5 MG PO CAPS
12.5000 mg | ORAL_CAPSULE | Freq: Every day | ORAL | Status: DC
  Administered 2015-08-19 – 2015-08-20 (×2): 12.5 mg via ORAL
  Filled 2015-08-19 (×2): qty 1

## 2015-08-19 MED ORDER — HYDROCODONE-ACETAMINOPHEN 5-325 MG PO TABS
1.0000 | ORAL_TABLET | ORAL | Status: DC | PRN
  Administered 2015-08-19: 2 via ORAL
  Filled 2015-08-19: qty 2

## 2015-08-19 MED ORDER — ALBUTEROL SULFATE (2.5 MG/3ML) 0.083% IN NEBU
2.5000 mg | INHALATION_SOLUTION | Freq: Four times a day (QID) | RESPIRATORY_TRACT | Status: DC
  Administered 2015-08-19 – 2015-08-20 (×3): 2.5 mg via RESPIRATORY_TRACT
  Filled 2015-08-19 (×3): qty 3

## 2015-08-19 MED ORDER — LISINOPRIL 10 MG PO TABS
10.0000 mg | ORAL_TABLET | Freq: Every day | ORAL | Status: DC
  Administered 2015-08-19 – 2015-08-20 (×2): 10 mg via ORAL
  Filled 2015-08-19 (×2): qty 1

## 2015-08-19 MED ORDER — ACETAMINOPHEN 325 MG PO TABS
650.0000 mg | ORAL_TABLET | Freq: Four times a day (QID) | ORAL | Status: DC | PRN
  Administered 2015-08-19: 650 mg via ORAL
  Filled 2015-08-19 (×2): qty 2

## 2015-08-19 MED ORDER — SENNOSIDES-DOCUSATE SODIUM 8.6-50 MG PO TABS: 1.0000 | ORAL_TABLET | Freq: Every evening | ORAL | Status: DC | PRN

## 2015-08-19 MED ORDER — ALBUTEROL SULFATE (2.5 MG/3ML) 0.083% IN NEBU: 5.0000 mg | INHALATION_SOLUTION | Freq: Once | RESPIRATORY_TRACT | Status: DC

## 2015-08-19 MED ORDER — CITALOPRAM HYDROBROMIDE 20 MG PO TABS
40.0000 mg | ORAL_TABLET | Freq: Every day | ORAL | Status: DC
  Administered 2015-08-19 – 2015-08-20 (×2): 40 mg via ORAL
  Filled 2015-08-19 (×2): qty 2

## 2015-08-19 MED ORDER — PREDNISONE 20 MG PO TABS
40.0000 mg | ORAL_TABLET | Freq: Every day | ORAL | Status: DC
  Administered 2015-08-20: 40 mg via ORAL
  Filled 2015-08-19: qty 2

## 2015-08-19 NOTE — H&P (Signed)
Date: 08/19/2015               Patient Name:  Donald Brady MRN: [id number]  DOB: 12-10-1975 Age / Sex: 40 y.o., male   PCP: [name], MD         Medical Service: Internal Medicine Teaching Service         Attending Physician: Dr. Aldine Contes, MD    First Contact: Dr. Tiburcio Pea Pager: [phone]  Second Contact: Dr. Aurelio Brash Pager: [phone]       After Hours (After 5p/  First Contact Pager: [phone]  weekends / holidays): Second Contact Pager: [phone]   Chief Complaint: Shortness of Breath  History of Present Illness: 70 Y O M with PMH - Bilateral PEs, CKD-3, obesity, Tobacco abuse, Asthma- exercise induced, HTN, Polycythemia, Depression, Factor 5 leiden heterozygousity, Polysubstance abuse. Presented today with c/o Shortness of breath that started at about 4.30am early this morning, while he was sleeping. He says he suddenly woke up from sleep, feeling like he was choking and drowning. He also felt like he was having a panic attack. He also started coughing when he woke up- mucousy and greenish, but previously he wasn't. He also endorsed chest tightness but denies chest pain. No associated noisy breathing, fever, sick contacts, leg swelling, pain or redness, bloating, or feeling he is retaining fluid. He has not taken any of his medications, including Evalina Field, in at least 2 months, he said he was been "hard headed" and was trying to work on exercise and diet instead, hoping he would not need these medication.  He says he last used Cocaine- over the weekend, with alcohol and Marijuana, celebrating the new year. He smokes 1 of cigs evry 2 days, started when he was ~40 yrs old. He was in the Ed the day prior with abdominal pain- epigastric, which was thought to be due to an easily reducible umbilical hernia, and he was to follow up with surgery.    He called EMS and they gave him Albuterol, Ipratropium and 125mg  of solumdrol and he felt better after about an hour. Per EMS he was Wheezing  on exam, and hypoxic- 80s. In the Ed, his O2 sats improved, to low 90s, but he remained slightly tachycardic.  Pt stable 3-4 pillow orthopnea for ~3 yrs. He is supposed to sleep with a CPAP but cannot afford it. He has been diagnosed with exercise induced asthma/mild intermittent, in 2014.  Meds: Current Facility-Administered Medications  Medication Dose Route Frequency Provider Last Rate Last Dose  . albuterol (PROVENTIL) (2.5 MG/3ML) 0.083% nebulizer solution 2.5 mg  2.5 mg Nebulization Q6H Ejiroghene E Emokpae, MD      . HYDROcodone-acetaminophen (NORCO/VICODIN) 5-325 MG per tablet 1-2 tablet  1-2 tablet Oral Q4H PRN Ejiroghene E Emokpae, MD      . ondansetron (ZOFRAN) injection 4 mg  4 mg Intravenous Q8H PRN Carlisle Cater, PA-C      . senna-docusate (Senokot-S) tablet 1 tablet  1 tablet Oral QHS PRN Ejiroghene Arlyce Dice, MD        Allergies: Allergies as of 08/19/2015 - Review Complete 08/19/2015  Allergen Reaction Noted  . Bee venom Anaphylaxis 11/27/2011  . Honey Anaphylaxis 11/27/2011  . Shellfish allergy Shortness Of Breath and Swelling 11/27/2011  . Ibuprofen Hives and Swelling 11/27/2011  . Betadine [povidone iodine]  01/20/2015   Past Medical History  Diagnosis Date  . Anxiety   . HLD (hyperlipidemia)   . History of seizures     no  known cause, per pt. - has been "years" since last seizure; no longer on anticonvulsant  . Arthritis     left knee  . GERD (gastroesophageal reflux disease)   . History of pulmonary embolus (PE)     takes Xarelto  . Asthma     no inhaler use in 1 year  . Lateral meniscus tear 01/2015    left knee  . Hypertension     states BP "runs high"; has been on med. "a while"  . Sleep apnea     had sleep study 10/2013:  "severe" sleep apnea, states could not afford CPAP machine  . Factor V Leiden [facility])    Past Surgical History  Procedure Laterality Date  . Video bronchoscopy Bilateral 12/06/2012    Procedure: VIDEO BRONCHOSCOPY WITHOUT FLUORO;   Surgeon: Juanito Doom, MD;  Location: [facility];  Service: Cardiopulmonary;  Laterality: Bilateral;  . Orif femur fracture Left   . Hand surgery Right     states knuckle removed  . Esophagogastroduodenoscopy (egd) with propofol  07/10/2014  . Cardiac catheterization N/A 02/19/2015    Procedure: Left Heart Cath and Coronary Angiography;  Surgeon: Peter M Martinique, MD;  Location: [place] [facility];  Service: Cardiovascular;  Laterality: N/A;  . Chondroplasty Left 03/26/2015    Procedure: CHONDROPLASTY;  Surgeon: Leandrew Koyanagi, MD;  Location: [place];  Service: Orthopedics;  Laterality: Left;  . Knee arthroscopy with lateral menisectomy Left 03/26/2015    Procedure: LEFT KNEE ARTHROSCOPY WITH LATERAL MENISECTOMY  CHONDROPLASTY;  Surgeon: Leandrew Koyanagi, MD;  Location: [place];  Service: Orthopedics;  Laterality: Left;   Family History  Problem Relation Age of Onset  . Diabetes Mother   . Hypertension Mother   . Hearing loss Father     died in his 12's  . Diabetes Maternal Aunt   . Hypertension Maternal Aunt   . Diabetes Maternal Uncle   . Hypertension Maternal Uncle   . Cancer Father     unknown type  . Stomach cancer Maternal Uncle   . Lung cancer Paternal Barbaraann Rondo     was a smoker  . Breast cancer Maternal Aunt   . Stomach cancer Maternal Uncle   . Clotting disorder Father   . Heart disease Father   . Liver disease Maternal Uncle     drinker  . Kidney disease Maternal Aunt    Social History   Social History  . Marital Status: Single    Spouse Name: N/A  . Number of Children: 1  . Years of Education: N/A   Occupational History  . Disabled- Architect   .  Other   Social History Main Topics  . Smoking status: Current Some Day Smoker -- 0.50 packs/day for 21 years    Types: Cigarettes  . Smokeless tobacco: Never Used     Comment: 1/2 pack/week  . Alcohol Use: 0.0 oz/week    0 Standard drinks or equivalent per week     Comment:  weekends  . Drug Use: No     Comment: no marijuana in 3 weeks  . Sexual Activity: Not on file   Other Topics Concern  . Not on file   Social History Narrative   Lives in [place] with his dog.  Has a 46 year old daughter that he sees daily.  Has good relationship with daughter's mom.  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     Review  of Systems: CONSTITUTIONAL- No Fever, pt endorses weightgain, with increased appetite. SKIN- No Rash or swelling. EYES- No change in vision Mouth/throat- No Sorethroat, or bleeding gums. CARDIAC- No Palpitations, or chest pain. GI- Abdominal pain is improved- 4/10, no constipation or diarrhea URINARY- No Frequency, or dysuria. NEUROLOGIC- No syncope, seizures. [place] he felt depressed about his conditions, and all the meds he has to take.   Physical Exam: Blood pressure 146/87, pulse 96, temperature 98 F (36.7 C), temperature source Oral, resp. rate 25, height 6\' 2"  (1.88 m), weight 370 lb (167.831 kg), SpO2 92 %. GENERAL- alert, pleasant, co-operative, appears as stated age, not in any distress. HEENT- Atraumatic, normocephalic, PERRL, oral mucosa appears mildly dry, neck supple. CARDIAC- RRR, no murmurs, rubs or gallops. RESP- Moving equal volumes of air, and clear to auscultation bilaterally, no wheezes or crackles. ABDOMEN- Soft, tenderness in epigastric area, no hernia observed on coughing or sitting half up, but present on palpation, no guarding or rebound, no organomegaly, bowel sounds present. NEURO- No obvious Cr N abnormality, moving extremities voluntarily. EXTREMITIES- pulse 2+, symmetric, trace pitting pedal edema- right leg, no tenderness or redness. SKIN-[place]oo right forearm. PSYCH- Normal mood and affect, appropriate thought content and speech.  Lab results: Basic Metabolic Panel:  Recent Labs  08/18/15 1020 08/19/15 0623  NA 140 138  K 4.0 4.0  CL 107 103  CO2 23 24  GLUCOSE 113* 121*  BUN  13 12  CREATININE 1.81* 2.05*  CALCIUM 9.0 8.7*   Liver Function Tests:  Recent Labs  08/18/15 1020 08/19/15 0623  AST 25 24  ALT 31 31  ALKPHOS 61 66  BILITOT 0.6 0.5  PROT 6.7 6.9  ALBUMIN 3.4* 3.7    Recent Labs  08/18/15 1020  LIPASE 26   CBC:  Recent Labs  08/18/15 1020 08/19/15 0623  WBC 8.0 7.8  NEUTROABS 4.8 3.8  HGB 17.0 17.9*  HCT 49.8 52.9*  MCV 89.1 90.1  PLT 249 252   Urinalysis:  Recent Labs  08/18/15 1025  COLORURINE YELLOW  LABSPEC 1.010  PHURINE 5.5  GLUCOSEU NEGATIVE  HGBUR TRACE*  BILIRUBINUR NEGATIVE  KETONESUR NEGATIVE  PROTEINUR 100*  NITRITE NEGATIVE  LEUKOCYTESUR NEGATIVE   Imaging results:  Dg Chest 2 View  08/19/2015  CLINICAL DATA:  Acute onset of shortness of breath. Initial encounter. EXAM: CHEST  2 VIEW COMPARISON:  Chest radiograph performed 02/18/2015 FINDINGS: The lungs are well-aerated. Vascular congestion is noted. Increased interstitial markings are concerning for mild pulmonary edema. There is no evidence of focal opacification, pleural effusion or pneumothorax. The heart is borderline normal in size. No acute osseous abnormalities are seen. IMPRESSION: Vascular congestion noted. Increased interstitial markings are concerning for mild pulmonary edema. Electronically Signed   By: Garald Balding M.D.   On: 08/19/2015 06:48   Other results: EKG: RAte- 101 BPM, regular, sinus rhythm, Large notched P waves- II, III and AVF, insignificant Q waves- 1 and AVL, QRS- 87, PR- 183, no ST or T wave abnormalities, no change compared to prior- 03/24/2015.  Assessment & Plan by Problem: Principal Problem:   Shortness of breath Active Problems:   Bilateral pulmonary embolism (HCC)   Obesity   Tobacco abuse   Intrinsic asthma   Essential hypertension, benign   Severe obstructive sleep apnea   Nonobstructive cardiomyopathy (HCC)   CKD (chronic kidney disease), stage III  Shortness of breath- Differentials- Asthma exacerbation,  Pulmonary embolism, Flash pulmonary edema, panic attack. Will attribute pts sudden SOB to  bronchospasm due to Asthma , considering PFTs suggesting Asthma- Exercise induced, initial Wheezing on exam, also considering ongoing Cocaine use which can cause acute respiratory distress within 48hrs of last use. Pulmonary Edema- though Chest xray suggest this, Bp not markedly elevated, pt appears euvolemic, he has a hx of non-ischemic Cardiomyopathy- EF- 40%, not compliant with meds- Imdur, Lisinopril and Hydralazine. Pulmonary embolism possible- given hx and also heterozygous for Factor 5 leiden, but unlikely given response to Treatment, pt has not been complaint with Evalina Field, CTA not done in ED due to declining kidney function, Cancelled V-Q scan already ordered as this will not change treatment plan.   - Admit to tele Obs - Albuterol nebs- Q6H scheduled. - Prednisone- 40mg  daily - Restart Xalreto at 15mg  BID - Considering congestion on chest xray- out-pt Echo. - If no significant response to steroid or bronchodils, consider small dose lasix  HTN- Stable. Pt has not taken any meds in 2-3 months- he is supposed to be on Lisinopril, Imdur, hydralazine.  Cr appears to be at stable at new baseline- 1.7-2. - Restart meds- lisinopril 10mg  daily - HCTZ, 12.5mg  daily - Can restart imdur in the am, with stable BP - Hold hydralazine and restart as outpt with stable BP.  Hyperglycemia- Not diabetic, family hx of Diabetes. - Hgba1c check  OSA- diagnosed as severe, Not the cause of acute SOB, but pt Hgb- 17.9, with Normal Epo-  05/22/2014. Chronic hypoxia likely the cause of his polycythemia, likely contribution from OHS.  Dispo: Disposition is deferred at this time, awaiting improvement of current medical problems. Anticipated discharge in approximately 1 day.   The patient does have a current PCP (Alexa Sherral Hammers, MD) and does need an [facility] hospital follow-up appointment after discharge.  The patient does not  know have transportation limitations that hinder transportation to clinic appointments.  Signed: Bethena Roys, MD 08/19/2015, 9:30 AM

## 2015-08-19 NOTE — Telephone Encounter (Signed)
Either way. You can specify the dates in the letter to state 08/19/15 until 12/15/2015 giving the family a 4 month period. Do you think this is reasonable for food stamps?  Thank you! [place]

## 2015-08-19 NOTE — Telephone Encounter (Signed)
CSW returned call to Donald Brady.  Mother notified letter request has been approved and will be waiting for pick up on 1/4.  Donald Brady voiced concern regarding what the [facility] card covers.  CSW informed mother Donald Brady is not insurance but allows recipients to receive access to care/specialist.  No coverage for ambulance transports.  CSW encouraged mother to discuss with patient about speaking with a financial counselor during his admission.  Donald Brady may be eligible for Medicaid based on his current medical expenses.  CSW informed mother letter request will be mailed and copy left at [facility] front office as mother discussed concern as patient is hospitalized on the [place] end of hospital and [facility] is located on the [place] wing.

## 2015-08-19 NOTE — ED Notes (Signed)
Per [facility], pt started having sudden onset SOB at 0430. Pt states he was told he has asthma but never needed inhalers. Per EMS, diminished in the lower lobes and diffuse wheezing. Given 10 mg albuterol, 0.5 mg atrovent, and 125 mg solumedrol. 18g to RAC. Pt states he feels better now. Denies any pain.

## 2015-08-19 NOTE — Progress Notes (Signed)
Patient admitted to room 2W37 alert and orientedx4 with diagnosis of shortness of breathe but none at this time Orientation to room and unit completed. Will continue to monitor

## 2015-08-19 NOTE — ED Notes (Signed)
Attempted report 

## 2015-08-19 NOTE — ED Notes (Signed)
MD at bedside. 

## 2015-08-19 NOTE — Progress Notes (Signed)
CSW consulted for assistance with Medicaid/ ETOH  CSW spoke with pt briefly concerning Medicaid application- pt reports that his mother is responsible for helping with all this and she is currently filling out Disability application with the help of a lawyer.  Pt gave CSW permission to contact pt mom.  CSW spoke with pt mom and explained Medicaid application process through [facility]- she expressed understanding and requested an application be left in pt room which was done  Pt mom also requested follow up from financial counselor regarding payment of hospital bill- CSW left message for a financial counselor to request a follow up  Pt girlfriend at bedside so unable to assess for ETOH use at this time.  Domenica Reamer, [place] Social Worker [phone]

## 2015-08-19 NOTE — ED Provider Notes (Signed)
CSN: [id number]     Arrival date & time 08/19/15  0601 History   First MD Initiated Contact with Patient 08/19/15 [phone]     Chief Complaint  Patient presents with  . Asthma     (Consider location/radiation/quality/duration/timing/severity/associated sxs/prior Treatment) HPI Comments: Patient with history of memory embolism, noncompliant with his medications (including Xarelto) for the past 2 months -- presents with episode of wheezing and shortness of breath starting acutely, waking patient from sleep at 4:30 AM. Patient states that he awoke and felt like he was going to pass out but did not have full syncope. EMS was called who noted wheezing. Administered albuterol 10 mg, Atrovent 0.5 mg, Solu-Medrol 125 mg. This seemed to make the patient's symptoms better. He complains of a tightness in his chest that is improved. Patient denies having symptoms like this in the past. He was told that he had asthma 2 years ago, however is on no medications for this and has never had an asthma attack like this before. Patient states that the quality of his symptoms this morning are different than his previous PE. He denies recent lower extremity swelling, orthopnea. Patient had a left heart catheterization 7/16 showed mild obstructive coronary artery disease and global hypokinesis consistent with nonischemic cardiomyopathy.   Patient is a 40 y.o. male presenting with asthma. The history is provided by the patient.  Asthma Pertinent negatives include no abdominal pain, chest pain, coughing, fever, headaches, myalgias, nausea, rash, sore throat or vomiting.    Past Medical History  Diagnosis Date  . Anxiety   . HLD (hyperlipidemia)   . History of seizures     no known cause, per pt. - has been "years" since last seizure; no longer on anticonvulsant  . Arthritis     left knee  . GERD (gastroesophageal reflux disease)   . History of pulmonary embolus (PE)     takes Xarelto  . Asthma     no inhaler use in 1  year  . Lateral meniscus tear 01/2015    left knee  . Hypertension     states BP "runs high"; has been on med. "a while"  . Sleep apnea     had sleep study 10/2013:  "severe" sleep apnea, states could not afford CPAP machine  . Factor V Leiden [facility])    Past Surgical History  Procedure Laterality Date  . Video bronchoscopy Bilateral 12/06/2012    Procedure: VIDEO BRONCHOSCOPY WITHOUT FLUORO;  Surgeon: Juanito Doom, MD;  Location: [facility];  Service: Cardiopulmonary;  Laterality: Bilateral;  . Orif femur fracture Left   . Hand surgery Right     states knuckle removed  . Esophagogastroduodenoscopy (egd) with propofol  07/10/2014  . Cardiac catheterization N/A 02/19/2015    Procedure: Left Heart Cath and Coronary Angiography;  Surgeon: Peter M Martinique, MD;  Location: [place] [facility];  Service: Cardiovascular;  Laterality: N/A;  . Chondroplasty Left 03/26/2015    Procedure: CHONDROPLASTY;  Surgeon: Leandrew Koyanagi, MD;  Location: [place];  Service: Orthopedics;  Laterality: Left;  . Knee arthroscopy with lateral menisectomy Left 03/26/2015    Procedure: LEFT KNEE ARTHROSCOPY WITH LATERAL MENISECTOMY  CHONDROPLASTY;  Surgeon: Leandrew Koyanagi, MD;  Location: [place];  Service: Orthopedics;  Laterality: Left;   Family History  Problem Relation Age of Onset  . Diabetes Mother   . Hypertension Mother   . Hearing loss Father     died in his 73's  .  Diabetes Maternal Aunt   . Hypertension Maternal Aunt   . Diabetes Maternal Uncle   . Hypertension Maternal Uncle   . Cancer Father     unknown type  . Stomach cancer Maternal Uncle   . Lung cancer Paternal Barbaraann Rondo     was a smoker  . Breast cancer Maternal Aunt   . Stomach cancer Maternal Uncle   . Clotting disorder Father   . Heart disease Father   . Liver disease Maternal Uncle     drinker  . Kidney disease Maternal Aunt    Social History  Substance Use Topics  . Smoking status: Current Some Day  Smoker -- 0.50 packs/day for 21 years    Types: Cigarettes  . Smokeless tobacco: Never Used     Comment: 1/2 pack/week  . Alcohol Use: 0.0 oz/week    0 Standard drinks or equivalent per week     Comment: weekends    Review of Systems  Constitutional: Negative for fever.  HENT: Negative for rhinorrhea and sore throat.   Eyes: Negative for redness.  Respiratory: Positive for shortness of breath and wheezing. Negative for cough.   Cardiovascular: Negative for chest pain.  Gastrointestinal: Negative for nausea, vomiting, abdominal pain and diarrhea.  Genitourinary: Negative for dysuria.  Musculoskeletal: Negative for myalgias.  Skin: Negative for rash.  Neurological: Positive for syncope. Negative for headaches.      Allergies  Bee venom; Honey; Shellfish allergy; Ibuprofen; and Betadine  Home Medications   Prior to Admission medications   Medication Sig Start Date End Date Taking? Authorizing Provider  oxyCODONE-acetaminophen (PERCOCET/ROXICET) 5-325 MG tablet Take 2 tablets by mouth every 4 (four) hours as needed for severe pain. 08/18/15   Shawn C Joy, PA-C   BP 140/121 mmHg  Pulse 101  Temp(Src) 98 F (36.7 C) (Oral)  Resp 20  Ht 6\' 2"  (1.88 m)  Wt 167.831 kg  BMI 47.49 kg/m2  SpO2 93% Physical Exam  Constitutional: He appears well-developed and well-nourished.  HENT:  Head: Normocephalic and atraumatic.  Mouth/Throat: Oropharynx is clear and moist.  Eyes: Conjunctivae are normal. Right eye exhibits no discharge. Left eye exhibits no discharge.  Neck: Normal range of motion. Neck supple.  Cardiovascular: Normal rate, regular rhythm and normal heart sounds.   Pulmonary/Chest: Effort normal. No respiratory distress. He has decreased breath sounds (global). He has no wheezes. He has no rales.  Patient well-appearing, desaturation to low 90s on room air.  Abdominal: Soft. There is no tenderness.  Musculoskeletal: He exhibits no edema or tenderness.  No edema.   Neurological: He is alert.  Skin: Skin is warm and dry.  Psychiatric: He has a normal mood and affect.  Nursing note and vitals reviewed.   ED Course  Procedures (including critical care time) Labs Review Labs Reviewed  CBC WITH DIFFERENTIAL/PLATELET - Abnormal; Notable for the following:    RBC 5.87 (*)    Hemoglobin 17.9 (*)    HCT 52.9 (*)    All other components within normal limits  COMPREHENSIVE METABOLIC PANEL - Abnormal; Notable for the following:    Glucose, Bld 121 (*)    Creatinine, Ser 2.05 (*)    Calcium 8.7 (*)    GFR calc non Af Amer 39 (*)    GFR calc Af Amer 46 (*)    All other components within normal limits  BRAIN NATRIURETIC PEPTIDE - Abnormal; Notable for the following:    B Natriuretic Peptide 208.6 (*)    All other components  within normal limits  Randolm Idol, ED    Imaging Review Dg Chest 2 View  08/19/2015  CLINICAL DATA:  Acute onset of shortness of breath. Initial encounter. EXAM: CHEST  2 VIEW COMPARISON:  Chest radiograph performed 02/18/2015 FINDINGS: The lungs are well-aerated. Vascular congestion is noted. Increased interstitial markings are concerning for mild pulmonary edema. There is no evidence of focal opacification, pleural effusion or pneumothorax. The heart is borderline normal in size. No acute osseous abnormalities are seen. IMPRESSION: Vascular congestion noted. Increased interstitial markings are concerning for mild pulmonary edema. Electronically Signed   By: Garald Balding M.D.   On: 08/19/2015 06:48   I have personally reviewed and evaluated these images and lab results as part of my medical decision-making.   EKG Interpretation   Date/Time:  Tuesday August 19 2015 06:07:53 EST Ventricular Rate:  101 PR Interval:  183 QRS Duration: 87 QT Interval:  355 QTC Calculation: 460 R Axis:   -62 Text Interpretation:  Sinus tachycardia Probable left atrial enlargement  Left anterior fascicular block Abnormal R-wave progression,  late  transition no significant change since Aug 2016 Confirmed by Regenia Skeeter  MD,  SCOTT ([id number]) on 08/19/2015 7:34:11 AM       6:40 AM Patient seen and examined. Work-up initiated. Medications ordered.   Vital signs reviewed and are as follows: BP 140/121 mmHg  Pulse 101  Temp(Src) 98 F (36.7 C) (Oral)  Resp 20  Ht 6\' 2"  (1.88 m)  Wt 167.831 kg  BMI 47.49 kg/m2  SpO2 93%  7:52 AM Patient cannot get CT angioedema due to renal function. Ventilation perfusion scan ordered.  Will admit to inpatient medicine teaching service for further evaluation and to reestablish the patient on this anticoagulation.  Spoke with Dr. Denton Brick who will see and admit.  MDM   Final diagnoses:  Shortness of breath  Hypoxia   Patient with history of PE, noncompliant on medications, with episode which may represent recurrent pulmonary embolism. He has new mild hypoxia, mild elevated BNP. Will admit for further evaluation of symptoms and treatment. Patient is improved while in emergency department and is stable at current time.   Carlisle Cater, PA-C 08/19/15 [id number]  Julianne Rice, MD 08/20/15 [phone]

## 2015-08-20 LAB — HEMOGLOBIN A1C
Hgb A1c MFr Bld: 6 % — ABNORMAL HIGH (ref 4.8–5.6)
MEAN PLASMA GLUCOSE: 126 mg/dL

## 2015-08-20 MED ORDER — HYDROCHLOROTHIAZIDE 12.5 MG PO CAPS: 12.5000 mg | ORAL_CAPSULE | Freq: Every day | ORAL | Status: DC

## 2015-08-20 MED ORDER — ALBUTEROL SULFATE (2.5 MG/3ML) 0.083% IN NEBU: 2.5000 mg | INHALATION_SOLUTION | RESPIRATORY_TRACT | Status: DC | PRN

## 2015-08-20 MED ORDER — PREDNISONE 20 MG PO TABS: 40.0000 mg | ORAL_TABLET | Freq: Every day | ORAL | Status: DC

## 2015-08-20 MED ORDER — IPRATROPIUM-ALBUTEROL 0.5-2.5 (3) MG/3ML IN SOLN: 3.0000 mL | Freq: Two times a day (BID) | RESPIRATORY_TRACT | Status: DC

## 2015-08-20 NOTE — Discharge Summary (Signed)
Name: Donald Brady MRN: [id number] DOB: September 17, 1975 40 y.o. PCP: [name], MD  Date of Admission: 08/19/2015  6:01 AM Date of Discharge: 08/20/2015 Attending Physician: Aldine Contes, MD  Discharge Diagnosis: 1. Asthma exacerbation Principal Problem:   Shortness of breath Active Problems:   Bilateral pulmonary embolism (HCC)   Obesity   Tobacco abuse   Intrinsic asthma   Essential hypertension, benign   Severe obstructive sleep apnea   Polycythemia, secondary   Nonobstructive cardiomyopathy (HCC)   CKD (chronic kidney disease), stage III  Discharge Medications:   Medication List    TAKE these medications     Albuterol inhaler as needed- patient informed me that he already has one at home    hydrochlorothiazide 12.5 MG capsule  Commonly known as:  MICROZIDE  Take 1 capsule (12.5 mg total) by mouth daily.     predniSONE 20 MG tablet  Commonly known as:  DELTASONE  Take 2 tablets (40 mg total) by mouth daily with breakfast.  Start taking on:  08/21/2015  Hydralazine- home dose Isosorbide mononitrate- home dose         Disposition and follow-up:   [name] was discharged from [facility] in Good condition.  At the hospital follow up visit please address:  1.  Asthma: Is he using his albuterol as needed and how often ? Finished his prednisone? Smoking cessation- I counseled extensively on this topic. He needs continued counseling.  CKD3- on discharge, creatinine of 2, repeat BMP  OSA: He had a sleep study done, wants to discuss CPAP treatment and insurance coverage- maybe our office SW can help.  Obesity- lifestyle modification   HTN: is he compliant with his home meds.   History of bilateral PE- needs refill of xarelto in 1 month  Prediabetes- His a1c of 6.0 on discharge - lifestyle modification again   2.  Labs / imaging needed at time of follow-up: BMP  3.  Pending labs/ test needing follow-up:   Follow-up  Appointments:     Follow-up Information    Follow up with Osa Craver, MD On 08/29/2015.   Specialty:  Internal Medicine   Why:  9.45 with PCP   Contact information:   [place] [address] [phone]       Discharge Instructions: Discharge Instructions    Diet - low sodium heart healthy    Complete by:  As directed      Increase activity slowly    Complete by:  As directed            Consultations:    Procedures Performed:  Dg Chest 2 View  08/19/2015  CLINICAL DATA:  Acute onset of shortness of breath. Initial encounter. EXAM: CHEST  2 VIEW COMPARISON:  Chest radiograph performed 02/18/2015 FINDINGS: The lungs are well-aerated. Vascular congestion is noted. Increased interstitial markings are concerning for mild pulmonary edema. There is no evidence of focal opacification, pleural effusion or pneumothorax. The heart is borderline normal in size. No acute osseous abnormalities are seen. IMPRESSION: Vascular congestion noted. Increased interstitial markings are concerning for mild pulmonary edema. Electronically Signed   By: Garald Balding M.D.   On: 08/19/2015 06:48    2D Echo:   Cardiac Cath:   Admission HPI:  40 Y O M with PMH - Bilateral PEs, CKD-3, obesity, Tobacco abuse, Asthma- exercise induced, HTN, Polycythemia, Depression, Factor 5 leiden heterozygousity, Polysubstance abuse. Presented today with c/o Shortness of breath that started at  about 4.30am early this morning, while he was sleeping. He says he suddenly woke up from sleep, feeling like he was choking and drowning. He also felt like he was having a panic attack. He also started coughing when he woke up- mucousy and greenish, but previously he wasn't. He also endorsed chest tightness but denies chest pain. No associated noisy breathing, fever, sick contacts, leg swelling, pain or redness, bloating, or feeling he is retaining fluid. He has not taken any of his medications, including Evalina Field, in at  least 2 months, he said he was been "hard headed" and was trying to work on exercise and diet instead, hoping he would not need these medication.  He says he last used Cocaine- over the weekend, with alcohol and Marijuana, celebrating the new year. He smokes 1 of cigs evry 2 days, started when he was ~40 yrs old. He was in the Ed the day prior with abdominal pain- epigastric, which was thought to be due to an easily reducible umbilical hernia, and he was to follow up with surgery.   He called EMS and they gave him Albuterol, Ipratropium and 125mg  of solumdrol and he felt better after about an hour. Per EMS he was Wheezing on exam, and hypoxic- 80s. In the Ed, his O2 sats improved, to low 90s, but he remained slightly tachycardic.  Pt stable 3-4 pillow orthopnea for ~3 yrs. He is supposed to sleep with a CPAP but cannot afford it. He has been diagnosed with exercise induced asthma/mild intermittent, in 2014.  Hospital Course by problem list: Principal Problem:   Shortness of breath Active Problems:   Bilateral pulmonary embolism (HCC)   Obesity   Tobacco abuse   Intrinsic asthma   Essential hypertension, benign   Severe obstructive sleep apnea   Polycythemia, secondary   Nonobstructive cardiomyopathy (HCC)   CKD (chronic kidney disease), stage III   Shortness of breath-  Most likely due to asthma exacerbation. He improved very well and on the day of discharge  was doing much better, no wheezing or rales on exam. He received albuterol nebs q6 scheduled. Patient able to ambulate well. Counseled extensively on smoking cessation, appropriate use of albuterol, and avoidance of illicit drugs, and also adherence to the anticoagulation. He is discharged on albuterol, and prednisone. His xarelto was restarted.  HTN- Stable. Pt has not taken any meds in 2-3 months- he is supposed to be on Lisinopril, Imdur, hydralazine. Cr appears to be at stable at new baseline- 1.7-2 and on discharge was at 2.  Restarted home meds on discharge. Repeat BMP outpatient    Hyperglycemia- a1c of 6 on discharge- sent a note to PCP for outpatient management  OSA- diagnosed as severe, Not the cause of acute SOB, but pt Hgb- 17.9, with Normal Epo- 05/22/2014. Chronic hypoxia likely the cause of his polycythemia, likely contribution from OHS. He is non compliant with cpap because he is not able to afford the machine.  Discharge Vitals:   BP 150/94 mmHg  Pulse 108  Temp(Src) 98.2 F (36.8 C) (Oral)  Resp 18  Ht 6\' 2"  (1.88 m)  Wt 370 lb (167.831 kg)  BMI 47.49 kg/m2  SpO2 100%  Discharge Labs:  Results for orders placed or performed during the hospital encounter of 08/19/15 (from the past 24 hour(s))  Hemoglobin A1c     Status: Abnormal   Collection Time: 08/19/15  3:25 PM  Result Value Ref Range   Hgb A1c MFr Bld 6.0 (H) 4.8 - 5.6 %  Mean Plasma Glucose 126 mg/dL    Signed: Burgess Estelle, MD 08/20/2015, 11:56 AM    Services Ordered on Discharge:  Equipment Ordered on Discharge:

## 2015-08-20 NOTE — Progress Notes (Signed)
Patient discharged home. Discharge instruction discussed with patient who verbalized understanding.

## 2015-08-20 NOTE — Discharge Instructions (Signed)
Please use the albuterol as needed and finish the prednisone- 3 more days of it (I have given you some extra)  Please continue your xarelto Please follow up in our clinic next Friday with your PCP

## 2015-08-20 NOTE — Progress Notes (Signed)
Subjective:  There were no acute events overnight. He is doing really well Breathing much better. Ready to be discharged.    Objective: Vital signs in last 24 hours: Filed Vitals:   08/19/15 2209 08/20/15 0504 08/20/15 0904 08/20/15 0950  BP:  150/106  150/94  Pulse:  100  108  Temp:  98.2 F (36.8 C)    TempSrc:  Oral    Resp:  18    Height:      Weight:      SpO2: 95% 100% 100%    Weight change:   Intake/Output Summary (Last 24 hours) at 08/20/15 1142 Last data filed at 08/20/15 0900  Gross per 24 hour  Intake   1680 ml  Output      0 ml  Net   1680 ml   GENERAL- OBESE<  alert, pleasant, co-operative, appears as stated age, not in any distress. CARDIAC- RRR, no murmurs, rubs or gallops. RESP- Moving equal volumes of air, and clear to auscultation bilaterally, no wheezes or crackles. ABDOMEN- Soft, nontender nondistended, normal bowel sounds in all 4 quadrants  Neuro- no focal deficits   Lab Results:  No results found for this or any previous visit (from the past 24 hour(s)).   Micro Results: No results found for this or any previous visit (from the past 240 hour(s)). Studies/Results: Dg Chest 2 View  08/19/2015  CLINICAL DATA:  Acute onset of shortness of breath. Initial encounter. EXAM: CHEST  2 VIEW COMPARISON:  Chest radiograph performed 02/18/2015 FINDINGS: The lungs are well-aerated. Vascular congestion is noted. Increased interstitial markings are concerning for mild pulmonary edema. There is no evidence of focal opacification, pleural effusion or pneumothorax. The heart is borderline normal in size. No acute osseous abnormalities are seen. IMPRESSION: Vascular congestion noted. Increased interstitial markings are concerning for mild pulmonary edema. Electronically Signed   By: Garald Balding M.D.   On: 08/19/2015 06:48   Medications: I have reviewed the patient's current medications. Scheduled Meds: . albuterol  2.5 mg Nebulization Q6H  . citalopram   40 mg Oral Daily  . hydrochlorothiazide  12.5 mg Oral Daily  . lisinopril  10 mg Oral Daily  . predniSONE  40 mg Oral Q breakfast  . Rivaroxaban  15 mg Oral BID WC   Continuous Infusions:  PRN Meds:.acetaminophen, senna-docusate Assessment/Plan: Principal Problem:   Shortness of breath Active Problems:   Bilateral pulmonary embolism (HCC)   Obesity   Tobacco abuse   Intrinsic asthma   Essential hypertension, benign   Severe obstructive sleep apnea   Polycythemia, secondary   Nonobstructive cardiomyopathy (HCC)   CKD (chronic kidney disease), stage III  Shortness of breath- Most likely due to asthma exacerbation. This morning he is doing much better, no wheezing or rales on exam. Patient able to ambulate well. Counseled extensively on smoking cessation, appropriate use of albuterol, and avoidance of illicit drugs, and also adherence to the anticoagulation.   - Albuterol nebs- Q6H scheduled. - Prednisone- 40mg  daily -d.c on albuterol  - Restart Xarelto at 15mg  BID   HTN- .stable - Restarted meds- lisinopril 10mg  daily - HCTZ, 12.5mg  daily -resume home meds on discharge  Hyperglycemia- Not diabetic, family hx of Diabetes. - Hgba1c check of 6.0 -follow up outpatient.   OSA- diagnosed as severe, Not the cause of acute SOB, but pt Hgb- 17.9, with Normal Epo- 05/22/2014. Chronic hypoxia likely the cause of his polycythemia, likely contribution from OHS. -follow up outpatient for CPAP   Dispo: Disposition  is deferred at this time, awaiting improvement of current medical problems.  Anticipated discharge in approximately 0 day(s).   The patient does have a current PCP (Alexa Sherral Hammers, MD) and does need an [facility] hospital follow-up appointment after discharge. 9 The patient does not have transportation limitations that hinder transportation to clinic appointments.  .Services Needed at time of discharge: Y = Yes, Blank = No PT:   OT:   RN:   Equipment:   Other:        Burgess Estelle, MD 08/20/2015, 11:42 AM

## 2015-08-23 ENCOUNTER — Telehealth: Payer: Self-pay | Admitting: Internal Medicine

## 2015-08-23 NOTE — Telephone Encounter (Signed)
Called the patient for follow up. He said he has not had a chance to pick up his meds including prednisone. I informed him that since he is doing well to not take the prednisone now.   He says he is doing well- cough is better, breathing better and can ambulate well. He has not smoked since discharge and I encouraged him to continue the abstinence.   He says he will come on Friday for HFU.

## 2015-08-29 ENCOUNTER — Encounter: Payer: Self-pay | Admitting: Internal Medicine

## 2015-08-29 ENCOUNTER — Ambulatory Visit: Payer: No Typology Code available for payment source | Admitting: Internal Medicine

## 2015-08-29 ENCOUNTER — Ambulatory Visit (INDEPENDENT_AMBULATORY_CARE_PROVIDER_SITE_OTHER): Payer: No Typology Code available for payment source | Admitting: Internal Medicine

## 2015-08-29 VITALS — BP 139/72 | HR 95 | Temp 98.2°F | Wt 372.6 lb

## 2015-08-29 DIAGNOSIS — J4520 Mild intermittent asthma, uncomplicated: Secondary | ICD-10-CM

## 2015-08-29 DIAGNOSIS — I428 Other cardiomyopathies: Secondary | ICD-10-CM

## 2015-08-29 DIAGNOSIS — J4540 Moderate persistent asthma, uncomplicated: Secondary | ICD-10-CM | POA: Insufficient documentation

## 2015-08-29 DIAGNOSIS — I10 Essential (primary) hypertension: Secondary | ICD-10-CM

## 2015-08-29 DIAGNOSIS — R7303 Prediabetes: Secondary | ICD-10-CM

## 2015-08-29 DIAGNOSIS — N183 Chronic kidney disease, stage 3 unspecified: Secondary | ICD-10-CM

## 2015-08-29 DIAGNOSIS — Z7901 Long term (current) use of anticoagulants: Secondary | ICD-10-CM

## 2015-08-29 DIAGNOSIS — F32A Depression, unspecified: Secondary | ICD-10-CM

## 2015-08-29 DIAGNOSIS — F329 Major depressive disorder, single episode, unspecified: Secondary | ICD-10-CM

## 2015-08-29 DIAGNOSIS — I129 Hypertensive chronic kidney disease with stage 1 through stage 4 chronic kidney disease, or unspecified chronic kidney disease: Secondary | ICD-10-CM

## 2015-08-29 DIAGNOSIS — J45909 Unspecified asthma, uncomplicated: Secondary | ICD-10-CM

## 2015-08-29 DIAGNOSIS — K429 Umbilical hernia without obstruction or gangrene: Secondary | ICD-10-CM | POA: Insufficient documentation

## 2015-08-29 MED ORDER — ALBUTEROL SULFATE HFA 108 (90 BASE) MCG/ACT IN AERS: 2.0000 | INHALATION_SPRAY | Freq: Four times a day (QID) | RESPIRATORY_TRACT | Status: DC | PRN

## 2015-08-29 MED ORDER — RIVAROXABAN 20 MG PO TABS: 20.0000 mg | ORAL_TABLET | Freq: Every day | ORAL | Status: DC

## 2015-08-29 MED ORDER — CITALOPRAM HYDROBROMIDE 40 MG PO TABS: 40.0000 mg | ORAL_TABLET | Freq: Every day | ORAL | Status: DC

## 2015-08-29 MED ORDER — LISINOPRIL 40 MG PO TABS: 40.0000 mg | ORAL_TABLET | Freq: Every day | ORAL | Status: DC

## 2015-08-29 MED ORDER — MOMETASONE FURO-FORMOTEROL FUM 200-5 MCG/ACT IN AERO: 2.0000 | INHALATION_SPRAY | Freq: Two times a day (BID) | RESPIRATORY_TRACT | Status: DC

## 2015-08-29 NOTE — Assessment & Plan Note (Signed)
BP Readings from Last 3 Encounters:  08/29/15 139/72  08/20/15 150/94  08/18/15 139/88    Lab Results  Component Value Date   NA 138 08/19/2015   K 4.0 08/19/2015   CREATININE 2.05* 08/19/2015    Assessment: Blood pressure control:  Controlled Progress toward BP goal:   Improved Comments: Patient reports compliance with lisinopril 40 mg daily. He is not taking Imdur or Hydralazine due to cost. He does not appear to be fluid overloaded or requiring a diuretic despite heart failure.   Plan: Medications:  continue current medications Other plans: Recheck BMET, will need to monitor closely in the setting of CKD

## 2015-08-29 NOTE — Assessment & Plan Note (Signed)
Lab Results  Component Value Date   HGBA1C 6.0* 08/19/2015   HGBA1C 5.7 08/03/2013   HGBA1C 5.7* 11/27/2011     Assessment: Diabetes control:  Controlled with diet Progress toward A1C goal:   Deteriorated Comments: Not on medicatons  Plan: Medications:  Continue to monitor off of medications Instruction/counseling given: discussed the need for weight loss and discussed diet Other plans: Repeat A1c in 6 months

## 2015-08-29 NOTE — Assessment & Plan Note (Signed)
Patient's GFR has been worsening likely secondary to uncontrolled HTN. Creatinine on discharge 2.0 with GFR 45. Baseline has been 1.5-2.0 in the last 6 months.   Plan: -Repeat BMET -Monitor lisinopril in setting of CKD

## 2015-08-29 NOTE — Progress Notes (Signed)
Subjective:    Patient ID: Donald Brady, male    DOB: 12/08/1975, 40 y.o.   MRN: [id number]  HPI Donald Brady is a 40 y.o. male with PMHx of b/l PE on Xarelto, HTN, NICM, Asthma, OSA who presents to the clinic for follow up for Asthma. Please see A&P for the status of the patient's chronic medical problems.   Past Medical History  Diagnosis Date  . Anxiety   . HLD (hyperlipidemia)   . History of seizures     no known cause, per pt. - has been "years" since last seizure; no longer on anticonvulsant  . Arthritis     left knee  . GERD (gastroesophageal reflux disease)   . History of pulmonary embolus (PE)     takes Xarelto  . Asthma     no inhaler use in 1 year  . Lateral meniscus tear 01/2015    left knee  . Hypertension     states BP "runs high"; has been on med. "a while"  . Sleep apnea     had sleep study 10/2013:  "severe" sleep apnea, states could not afford CPAP machine  . Factor V Leiden ([place])   . Shortness of breath dyspnea     Outpatient Encounter Prescriptions as of 08/29/2015  Medication Sig  . albuterol (PROVENTIL HFA;VENTOLIN HFA) 108 (90 Base) MCG/ACT inhaler Inhale 2 puffs into the lungs every 6 (six) hours as needed for wheezing or shortness of breath.  . citalopram (CELEXA) 40 MG tablet Take 1 tablet (40 mg total) by mouth daily.  Marland Kitchen lisinopril (PRINIVIL,ZESTRIL) 40 MG tablet Take 1 tablet (40 mg total) by mouth daily.  . mometasone-formoterol (DULERA) 200-5 MCG/ACT AERO Inhale 2 puffs into the lungs 2 (two) times daily.  . rivaroxaban (XARELTO) 20 MG TABS tablet Take 1 tablet (20 mg total) by mouth daily with supper.  . [DISCONTINUED] hydrochlorothiazide (MICROZIDE) 12.5 MG capsule Take 1 capsule (12.5 mg total) by mouth daily.  . [DISCONTINUED] predniSONE (DELTASONE) 20 MG tablet Take 2 tablets (40 mg total) by mouth daily with breakfast.   No facility-administered encounter medications on file as of 08/29/2015.    Family History  Problem Relation Age of  Onset  . Diabetes Mother   . Hypertension Mother   . Hearing loss Father     died in his 70's  . Diabetes Maternal Aunt   . Hypertension Maternal Aunt   . Diabetes Maternal Uncle   . Hypertension Maternal Uncle   . Cancer Father     unknown type  . Stomach cancer Maternal Uncle   . Lung cancer Paternal Barbaraann Rondo     was a smoker  . Breast cancer Maternal Aunt   . Stomach cancer Maternal Uncle   . Clotting disorder Father   . Heart disease Father   . Liver disease Maternal Uncle     drinker  . Kidney disease Maternal Aunt     Social History   Social History  . Marital Status: Single    Spouse Name: N/A  . Number of Children: 1  . Years of Education: N/A   Occupational History  . Disabled- Architect   .  Other   Social History Main Topics  . Smoking status: Current Some Day Smoker -- 0.50 packs/day for 21 years    Types: Cigarettes  . Smokeless tobacco: Never Used     Comment: 1/2 pack/week  . Alcohol Use: 0.0 oz/week    0 Standard drinks or equivalent per  week     Comment: weekends  . Drug Use: No     Comment: no marijuana in 3 weeks  . Sexual Activity: Not on file   Other Topics Concern  . Not on file   Social History Narrative   Lives in [place] with his dog.  Has a 50 year old daughter that he sees daily.  Has good relationship with daughter's mom.  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     Review of Systems General: Admits to fatigue. Denies fever, chills.  Respiratory: Admits to SOB, cough, DOE, and occasional chest tightness, and wheezing.   Cardiovascular: Denies chest pain and palpitations.  Gastrointestinal: Admits to intermittent abdominal pain and hernia. Denies nausea, vomiting, diarrhea, constipation, blood in stool.  Endocrine: Denies polyuria, and polydipsia. Neurological: Denies dizziness, headaches, weakness, lightheadedness Psychiatric/Behavioral: Admits to depression, stable. Denies nervousness, agitation.      Objective:   Physical Exam Filed Vitals:   08/29/15 1457  BP: 139/72  Pulse: 95  Temp: 98.2 F (36.8 C)  TempSrc: Oral  Weight: 372 lb 9.6 oz (169.01 kg)  SpO2: 99%   General: Vital signs reviewed.  Patient is obese, in no acute distress and cooperative with exam.  Cardiovascular: Mildly tachycardic, regular rhythm, S1 normal, S2 normal, no murmurs, gallops, or rubs. Pulmonary/Chest: Clear to auscultation bilaterally, no wheezes, rales, or rhonchi. Good air movement. Abdominal: Soft, non-tender, non-distended, BS +, small reducible, non-incarcerated umbilical hernia palpated.  Extremities: Trace lower extremity edema bilaterally.  Skin: Warm, dry and intact. No rashes or erythema. Psychiatric: Normal mood and affect. speech and behavior is normal. Cognition and memory are normal.      Assessment & Plan:   Please see problem based assessment and plan.

## 2015-08-29 NOTE — Assessment & Plan Note (Signed)
Patient has history of NICM diagnosed on LHC in July 2016. At that time, cath showed nonobstructive CAD with marked ectasia in the dominant LCx and in the RCA and moderate LV dysfunction with EF of 35-40% (deteriorated from 2014 EF of 55%). Recommendations were for medical therapy with lisinopril HCT, and add hydralazine and nitrates. Patient needs repeat echo as outpatient; however patient has not been on recommended regimen. He has only been taking lisinopril due to cost. Patient is following up with Cardiology in February. He complains of shortness of breath and DOE; however, he does not appear to volume overloaded on examination. Trace pitting edema and clear lung on examination.   Plan: -Follow up with Cardiology -Consider echocardiogram -Continue lisinopril 40 mg daily, monitor in setting of CKD -I will appreciate Cardiology's input on further medical management as patient is having a hard time affording hydralazine/imdur. Consider BB/diuretics

## 2015-08-29 NOTE — Assessment & Plan Note (Signed)
Patient presents for follow up after hospitalization for an asthma exacerbation. He was not able to buy asthma at discharge due to $60 price. Previous PFTs and symptoms above characterized him as mild intermittent versus exercise induced. However, upon further discussion, his symptoms seems to have worsened and patient's symptoms characterize him as moderate intermittent given his nighttime awakenings and limitation in activity. However, some of this may be skewed by his heart failure, obesity and OSA.   Plan: -Given albuterol inhaler sample -Given 2 Dulera medium dose ICS+LABA 200-5 2 puffs BID

## 2015-08-29 NOTE — Patient Instructions (Signed)
FOR YOUR ASTHMA: -USE ALBUTEROL INHALER 2 PUFFS EVERY 6 HOURS AS NEEDED FOR WHEEZING -USE DULERA 2 PUFFS TWICE A DAY EVERY DAY  FOR YOUR BLOOD PRESSURE: -CONTINUE LISINOPRIL 40 MG ONCE A DAY  FOR YOUR HEART: -CONTINUE LISINOPRIL -FOLLOW UP WITH CARDIOLOGY IN ONE MONTH  FOR YOUR DEPRESSION: -CONTINUE CELEXA 40 MG ONCE A DAY  FOR YOUR BLOOD CLOTS: -CONTINUE XARELTO ONCE A DAY  FOR YOUR HERNIA: -WE HAVE REFERRED YOU TO GENERAL SURGERY FOR ELECTIVE SURGERY -HOWEVER, GO TO THE ED IF YOU HAVE SEVERE, UNCONTROLLED PAIN OR YOUR HERNIA CANNOT BE PUSHED BACK IN

## 2015-08-29 NOTE — Assessment & Plan Note (Signed)
Patient reports compliance with his Xarelto 20 mg daily.   Plan: -Continue Xarelto 20 mg daily

## 2015-08-29 NOTE — Assessment & Plan Note (Signed)
Patient was evaluated in the ED in November for acute abdominal pain and diagnosed with a new umbilical/abdominal hernia after lifting heavy weights. Hernia is tender when he sits up. It is reducible and non-tender most of the time. Patient is interested in discussing surgery with a surgeon. However, this would be elective and given his multiple unresolved issues at this time, I would hold off on surgery.  Plan: -Referred to general surgery for discussion of future elective surgery -Discussed signs and symptoms to go the ED

## 2015-08-29 NOTE — Assessment & Plan Note (Signed)
Patient reports much better control of his anxiety and depression with Celexa 40 mg daily. This is the first time in a while I have seem him happy and not tearful on examination.   Plan: -Continue Celexa 40 mg daily

## 2015-08-29 NOTE — Progress Notes (Signed)
Medicine attending: Medical history, presenting problems, physical findings, and medications, reviewed with resident physician Dr Alexa Richardson on the day of the patient visit and I concur with her evaluation and management plan. 

## 2015-08-30 LAB — BMP8+ANION GAP
Anion Gap: 20 mmol/L — ABNORMAL HIGH (ref 10.0–18.0)
BUN/Creatinine Ratio: 12 (ref 8–19)
BUN: 18 mg/dL (ref 6–20)
CO2: 22 mmol/L (ref 18–29)
Calcium: 9.5 mg/dL (ref 8.7–10.2)
Chloride: 99 mmol/L (ref 96–106)
Creatinine, Ser: 1.53 mg/dL — ABNORMAL HIGH (ref 0.76–1.27)
GFR calc Af Amer: 66 mL/min/1.73
GFR calc non Af Amer: 57 mL/min/1.73 — ABNORMAL LOW
Glucose: 92 mg/dL (ref 65–99)
Potassium: 4.4 mmol/L (ref 3.5–5.2)
Sodium: 141 mmol/L (ref 134–144)

## 2015-09-12 ENCOUNTER — Telehealth: Payer: Self-pay | Admitting: Internal Medicine

## 2015-09-12 NOTE — Telephone Encounter (Signed)
LMTCB IF HE NEEDS TO CANCEL

## 2015-09-15 ENCOUNTER — Encounter: Payer: Self-pay | Admitting: Pulmonary Disease

## 2015-09-15 ENCOUNTER — Ambulatory Visit (INDEPENDENT_AMBULATORY_CARE_PROVIDER_SITE_OTHER): Payer: No Typology Code available for payment source | Admitting: Pulmonary Disease

## 2015-09-15 ENCOUNTER — Ambulatory Visit: Payer: No Typology Code available for payment source

## 2015-09-15 VITALS — BP 145/102 | HR 92 | Temp 98.0°F | Resp 18 | Ht 75.0 in | Wt 369.4 lb

## 2015-09-15 DIAGNOSIS — M25561 Pain in right knee: Secondary | ICD-10-CM

## 2015-09-15 MED ORDER — NAPROXEN 500 MG PO TABS: 500.0000 mg | ORAL_TABLET | Freq: Two times a day (BID) | ORAL | Status: DC

## 2015-09-15 MED ORDER — HYDROCODONE-ACETAMINOPHEN 5-325 MG PO TABS: 1.0000 | ORAL_TABLET | Freq: Three times a day (TID) | ORAL | Status: DC | PRN

## 2015-09-15 MED FILL — HYDROCODON-APAP 5-325: 5-325 | 5 days supply | Qty: 15 | Fill #0

## 2015-09-15 NOTE — Progress Notes (Signed)
Subjective:    Patient ID: Donald Brady, male    DOB: 1975-12-19, 40 y.o.   MRN: [id number]  HPI Donald Brady is a 40 year old man with history of asthma, HLD, GERD, PE on Xarelto, L knee lateral meniscus tear, HTN, OSA, anxiety presenting for evaluation of R knee pain.  He was at a funeral this past weekend and stepped in a hole in the ground. He felt a popping sensation and his leg went in a varus formation. He felt to the ground after this. No LOC or head trauma. The pain is sharp, constant, and throbbing. It is located on his lateral knee. No swelling or erythema. He has not had an injury to his right knee before. He has been alternating heat and ice with minimal relief. He has been using a crutch to minimize weight bearing on his right knee. He has not noted any locking or giving of the knee.  Review of Systems Constitutional: no fevers/chills Respiratory: no shortness of breath Cardiovascular: no chest pain  Past Medical History  Diagnosis Date  . Anxiety   . HLD (hyperlipidemia)   . History of seizures     no known cause, per pt. - has been "years" since last seizure; no longer on anticonvulsant  . Arthritis     left knee  . GERD (gastroesophageal reflux disease)   . History of pulmonary embolus (PE)     takes Xarelto  . Asthma     no inhaler use in 1 year  . Lateral meniscus tear 01/2015    left knee  . Hypertension     states BP "runs high"; has been on med. "a while"  . Sleep apnea     had sleep study 10/2013:  "severe" sleep apnea, states could not afford CPAP machine  . Factor V Leiden ([place])   . Shortness of breath dyspnea     Current Outpatient Prescriptions on File Prior to Visit  Medication Sig Dispense Refill  . albuterol (PROVENTIL HFA;VENTOLIN HFA) 108 (90 Base) MCG/ACT inhaler Inhale 2 puffs into the lungs every 6 (six) hours as needed for wheezing or shortness of breath. 6.7 g 3  . citalopram (CELEXA) 40 MG tablet Take 1 tablet (40 mg total) by mouth  daily. 30 tablet 11  . lisinopril (PRINIVIL,ZESTRIL) 40 MG tablet Take 1 tablet (40 mg total) by mouth daily. 30 tablet 11  . mometasone-formoterol (DULERA) 200-5 MCG/ACT AERO Inhale 2 puffs into the lungs 2 (two) times daily. 8.8 g 3  . rivaroxaban (XARELTO) 20 MG TABS tablet Take 1 tablet (20 mg total) by mouth daily with supper. 30 tablet 11  . [DISCONTINUED] hydrALAZINE (APRESOLINE) 10 MG tablet Take 1 tablet (10 mg total) by mouth 3 (three) times daily. (Patient not taking: Reported on 08/18/2015) 90 tablet 11  . [DISCONTINUED] isosorbide mononitrate (IMDUR) 30 MG 24 hr tablet Take 1 tablet (30 mg total) by mouth daily. (Patient not taking: Reported on 08/18/2015) 30 tablet 11   No current facility-administered medications on file prior to visit.    Today's Vitals   09/15/15 1033 09/15/15 1034  BP: 145/102   Pulse: 92   Temp: 98 F (36.7 C)   TempSrc: Oral   Resp: 18   Height: 6\' 3"  (1.905 m)   Weight: 369 lb 6.4 oz (167.559 kg)   SpO2: 99%   PainSc:  10-Worst pain ever     Objective:   Physical Exam  Constitutional: He appears well-developed and well-nourished.  HENT:  Head: Normocephalic and atraumatic.  Musculoskeletal: Normal range of motion. He exhibits tenderness (lateral knee). He exhibits no edema.       Right knee: He exhibits no swelling, no effusion, no deformity, no erythema, normal alignment, no LCL laxity (increased pain with varus movement) and no MCL laxity.  Skin: Skin is warm and dry. No erythema.   Assessment & Plan:  Please refer to problem based charting.

## 2015-09-15 NOTE — Progress Notes (Signed)
I saw and evaluated the patient.  I personally confirmed the key portions of Dr. Marjory Sneddon history and exam and reviewed pertinent patient test results.  The assessment, diagnosis, and plan were formulated together and I agree with the documentation in the resident's note.

## 2015-09-15 NOTE — Patient Instructions (Addendum)
Knee Pain Knee pain is a common problem. It can have many causes. The pain often goes away by following your doctor's home care instructions. Treatment for ongoing pain will depend on the cause of your pain. If your knee pain continues, more tests may be needed to diagnose your condition. Tests may include X-rays or other imaging studies of your knee. HOME CARE  Take naproxen 1 tablet twice a day. If you have pain still, you may take 1 tablet of hydrocodone-acetaminophen.  Rest your knee and keep it raised (elevated) while you are resting.  Do not do things that cause pain or make your pain worse.  Avoid activities where both feet leave the ground at the same time, such as running, jumping rope, or doing jumping jacks.  Apply ice to the knee area:  Put ice in a plastic bag.  Place a towel between your skin and the bag.  Leave the ice on for 20 minutes, 2-3 times a day.  Sleep with a pillow under your knee.  Lose weight if you are overweight. Being overweight can make your knee hurt more.  Do not use any tobacco products, including cigarettes, chewing tobacco, or electronic cigarettes. If you need help quitting, ask your doctor. Smoking may slow the healing of any bone and joint problems that you may have. GET HELP IF:  Your knee pain does not stop, it changes, or it gets worse.  You have a fever along with knee pain.  Your knee gives out or locks up.  Your knee becomes more swollen. GET HELP RIGHT AWAY IF:   Your knee feels hot to the touch.  You have chest pain or trouble breathing.   This information is not intended to replace advice given to you by your health care provider. Make sure you discuss any questions you have with your health care provider.   Document Released: 10/29/2008 Document Revised: 08/23/2014 Document Reviewed: 10/03/2013 Elsevier Interactive Patient Education [employer].

## 2015-09-15 NOTE — Assessment & Plan Note (Addendum)
Assessment: Acute right knee pain after varus injury. No effusion noted - low suspicion for complete tear of collateral ligament. Differential includes lateral collateral ligament injury/tear, ACL injury/tear, meniscus injury/tear, fracture.  Plan: -XR Right knee.  -Continue ice, rest. Naproxen 500mg  BID for 7 days. Norco 5-325mg  1 tablet q 8hr prn breakthrough pain. -Return precautions provided.

## 2015-09-18 ENCOUNTER — Ambulatory Visit (INDEPENDENT_AMBULATORY_CARE_PROVIDER_SITE_OTHER): Payer: No Typology Code available for payment source | Admitting: Sports Medicine

## 2015-09-18 ENCOUNTER — Encounter: Payer: Self-pay | Admitting: Sports Medicine

## 2015-09-18 VITALS — BP 159/102 | HR 92 | Ht 75.0 in | Wt 369.0 lb

## 2015-09-18 DIAGNOSIS — M25561 Pain in right knee: Secondary | ICD-10-CM

## 2015-09-18 MED ORDER — METHYLPREDNISOLONE ACETATE 40 MG/ML IJ SUSP
40.0000 mg | Freq: Once | INTRAMUSCULAR | Status: AC
  Administered 2015-09-18: 40 mg via INTRA_ARTICULAR

## 2015-09-19 NOTE — Progress Notes (Signed)
   Subjective:    Patient ID: Donald Brady, male    DOB: 05-05-1976, 40 y.o.   MRN: [id number]  HPI chief complaint: Right knee pain  Patient comes in today complaining of acute right knee pain. While at a funeral earlier this week he stepped in a hole injuring his right knee. He has had a sharp stabbing discomfort along the lateral right knee ever since. Intermittent swelling as well. No instability. Pain is worse with weightbearing. He denies any significant problems with his right knee in the past. He is status post left knee arthroscopy for a medial meniscectomy and has done very well.  Interim medical history reviewed Medications reviewed. He is now taking Xarelto in place of Coumadin Allergies reviewed    Review of Systems As above    Objective:   Physical Exam  Obese. No acute distress. Awake alert and oriented 3. Vital signs reviewed.  Right knee: Full range of motion. Trace effusion. He is tender to palpation along the lateral joint line with pain but no popping with McMurray's. No tenderness to palpation along the lateral joint line. Knee is stable to valgus and varus stressing. Negative anterior drawer, negative posterior drawer. Neurovascular intact distally.      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 Patient would like to start with a cortisone injection. His right knee is injected today utilizing an anterior lateral approach after risks and benefits were explained. He tolerated this without difficulty. If symptoms persist despite today's injection then I would consider diagnostic imaging initially in the form of x-rays and potentially an MRI. Follow-up for ongoing or recalcitrant issues.  Consent obtained and verified. Time-out conducted. Noted no overlying erythema, induration, or other signs of local infection. Skin prepped in a sterile  fashion. Topical analgesic spray: Ethyl chloride. Joint: right knee Needle: 22g 1.5 inch Completed without difficulty. Meds: 3cc 1% xylocaine, 1cc (40mg ) depomedrol  Advised to call if fevers/chills, erythema, induration, drainage, or persistent bleeding.

## 2015-09-22 ENCOUNTER — Encounter: Payer: Self-pay | Admitting: Cardiology

## 2015-10-01 ENCOUNTER — Ambulatory Visit (INDEPENDENT_AMBULATORY_CARE_PROVIDER_SITE_OTHER): Payer: No Typology Code available for payment source | Admitting: Cardiology

## 2015-10-01 ENCOUNTER — Encounter: Payer: Self-pay | Admitting: Cardiology

## 2015-10-01 VITALS — BP 154/104 | HR 98 | Ht 75.0 in | Wt 373.5 lb

## 2015-10-01 DIAGNOSIS — N183 Chronic kidney disease, stage 3 unspecified: Secondary | ICD-10-CM

## 2015-10-01 DIAGNOSIS — I428 Other cardiomyopathies: Secondary | ICD-10-CM

## 2015-10-01 DIAGNOSIS — I10 Essential (primary) hypertension: Secondary | ICD-10-CM

## 2015-10-01 DIAGNOSIS — I110 Hypertensive heart disease with heart failure: Secondary | ICD-10-CM

## 2015-10-01 DIAGNOSIS — I5022 Chronic systolic (congestive) heart failure: Secondary | ICD-10-CM

## 2015-10-01 DIAGNOSIS — Z7901 Long term (current) use of anticoagulants: Secondary | ICD-10-CM

## 2015-10-01 DIAGNOSIS — I119 Hypertensive heart disease without heart failure: Secondary | ICD-10-CM | POA: Insufficient documentation

## 2015-10-01 NOTE — Patient Instructions (Signed)
We will give Dr. Marvel Plan recommendations about your heart medication  I will see you in 6 months.

## 2015-10-01 NOTE — Progress Notes (Signed)
Cardiology Office Note   Date:  10/01/2015   ID:  Donald Brady, DOB 22-Jan-1976, MRN [id number]  PCP:  Osa Craver, MD  Cardiologist:   Peter Martinique, MD   Chief Complaint  Patient presents with  . Follow-up    CATH//pt states no Sx.      History of Present Illness: Donald Brady is a 40 y.o. male is seen for follow up nonischemic cardiomyopathy.  He has a history of Factor V Leiden deficiency and prior pulmonary embolus. He is on chronic Xarelto. He has multiple cardiac risk factors including HTN, hypercholesterolemia, tobacco abuse, and family history of early CAD. He has a history of cocaine abuse. He was evaluated in July 2016 for an abnormal Ecg. Echo showed reduction in EF to 35-40% that was new since 2014. Cardiac cath showed no obstructive CAD. Medical therapy was recommended. He was started on ACEi, Hydrdalazine, and nitrates. Beta blocker held due to cocaine abuse. Since then he quit taking his medication due to cost. Admitted in January with acute asthma exacerbation. Treated with inhalers and steroids. Now back on lisinopril. Working with Social services to get help with his medication. Last used cocaine New years. Denies current SOB or edema. Weight stable if not improved.  Past Medical History  Diagnosis Date  . Anxiety   . HLD (hyperlipidemia)   . History of seizures     no known cause, per pt. - has been "years" since last seizure; no longer on anticonvulsant  . Arthritis     left knee  . GERD (gastroesophageal reflux disease)   . History of pulmonary embolus (PE)     takes Xarelto  . Asthma     no inhaler use in 1 year  . Lateral meniscus tear 01/2015    left knee  . Hypertension     states BP "runs high"; has been on med. "a while"  . Sleep apnea     had sleep study 10/2013:  "severe" sleep apnea, states could not afford CPAP machine  . Factor V Leiden ([place])   . Shortness of breath dyspnea     Past Surgical History  Procedure Laterality Date  . Video  bronchoscopy Bilateral 12/06/2012    Procedure: VIDEO BRONCHOSCOPY WITHOUT FLUORO;  Surgeon: Juanito Doom, MD;  Location: [facility];  Service: Cardiopulmonary;  Laterality: Bilateral;  . Orif femur fracture Left   . Hand surgery Right     states knuckle removed  . Esophagogastroduodenoscopy (egd) with propofol  07/10/2014  . Cardiac catheterization N/A 02/19/2015    Procedure: Left Heart Cath and Coronary Angiography;  Surgeon: Peter M Martinique, MD;  Location: [facility];  Service: Cardiovascular;  Laterality: N/A;  . Chondroplasty Left 03/26/2015    Procedure: CHONDROPLASTY;  Surgeon: Leandrew Koyanagi, MD;  Location: [facility];  Service: Orthopedics;  Laterality: Left;  . Knee arthroscopy with lateral menisectomy Left 03/26/2015    Procedure: LEFT KNEE ARTHROSCOPY WITH LATERAL MENISECTOMY  CHONDROPLASTY;  Surgeon: Leandrew Koyanagi, MD;  Location: [facility];  Service: Orthopedics;  Laterality: Left;     Current Outpatient Prescriptions  Medication Sig Dispense Refill  . albuterol (PROVENTIL HFA;VENTOLIN HFA) 108 (90 Base) MCG/ACT inhaler Inhale 2 puffs into the lungs every 6 (six) hours as needed for wheezing or shortness of breath. 6.7 g 3  . citalopram (CELEXA) 40 MG tablet Take 1 tablet (40 mg total) by mouth daily. 30 tablet 11  . HYDROcodone-acetaminophen (NORCO/VICODIN)  5-325 MG tablet Take 1 tablet by mouth every 8 (eight) hours as needed for moderate pain. 15 tablet 0  . lisinopril (PRINIVIL,ZESTRIL) 40 MG tablet Take 1 tablet (40 mg total) by mouth daily. 30 tablet 11  . mometasone-formoterol (DULERA) 200-5 MCG/ACT AERO Inhale 2 puffs into the lungs 2 (two) times daily. 8.8 g 3  . naproxen (NAPROSYN) 500 MG tablet Take 1 tablet (500 mg total) by mouth 2 (two) times daily with a meal. 14 tablet 0  . rivaroxaban (XARELTO) 20 MG TABS tablet Take 1 tablet (20 mg total) by mouth daily with supper. 30 tablet 11  . [DISCONTINUED] hydrALAZINE (APRESOLINE) 10  MG tablet Take 1 tablet (10 mg total) by mouth 3 (three) times daily. (Patient not taking: Reported on 08/18/2015) 90 tablet 11  . [DISCONTINUED] isosorbide mononitrate (IMDUR) 30 MG 24 hr tablet Take 1 tablet (30 mg total) by mouth daily. (Patient not taking: Reported on 08/18/2015) 30 tablet 11   No current facility-administered medications for this visit.    Allergies:   Bee venom; Honey; Shellfish allergy; Ibuprofen; and Betadine    Social History:  The patient  reports that he has been smoking Cigarettes.  He has a 10.5 pack-year smoking history. He has never used smokeless tobacco. He reports that he drinks alcohol. He reports that he does not use illicit drugs.   Family History:  The patient's family history includes Breast cancer in his maternal aunt; Cancer in his father; Clotting disorder in his father; Diabetes in his maternal aunt, maternal uncle, and mother; Hearing loss in his father; Heart disease in his father; Hypertension in his maternal aunt, maternal uncle, and mother; Kidney disease in his maternal aunt; Liver disease in his maternal uncle; Lung cancer in his paternal uncle; Stomach cancer in his maternal uncle and maternal uncle.    ROS:  Please see the history of present illness.   Otherwise, review of systems are positive for none.   All other systems are reviewed and negative.    PHYSICAL EXAM: VS:  BP 154/104 mmHg  Pulse 98  Ht 6\' 3"  (1.905 m)  Wt 169.418 kg (373 lb 8 oz)  BMI 46.68 kg/m2 , BMI Body mass index is 46.68 kg/(m^2). GEN: Well nourished, obese BM, in no acute distress HEENT: normal Neck: no JVD, carotid bruits, or masses Cardiac: RRR; no murmurs, rubs, or gallops,no edema  Respiratory:  clear to auscultation bilaterally, normal work of breathing GI: soft, nontender, nondistended, + BS MS: no deformity or atrophy Skin: warm and dry, no rash Neuro:  Strength and sensation are intact Psych: euthymic mood, full affect   EKG:  EKG is not ordered  today.    Recent Labs: 08/19/2015: ALT 31; B Natriuretic Peptide 208.6*; Hemoglobin 17.9*; Platelets 252 08/29/2015: BUN 18; Creatinine, Ser 1.53*; Potassium 4.4; Sodium 141    Lipid Panel    Component Value Date/Time   CHOL 244* 04/23/2014 1125   TRIG 272* 04/23/2014 1125   HDL 46 04/23/2014 1125   CHOLHDL 5.3 04/23/2014 1125   VLDL 54* 04/23/2014 1125   LDLCALC 144* 04/23/2014 1125      Wt Readings from Last 3 Encounters:  10/01/15 169.418 kg (373 lb 8 oz)  09/18/15 167.377 kg (369 lb)  09/15/15 167.559 kg (369 lb 6.4 oz)      Other studies Reviewed: Additional studies/ records that were reviewed today include: none. Review of the above records demonstrates: n/A   ASSESSMENT AND PLAN:  1.  Nonischemic cardiomyopathy. Last  EF 35-40%. Most likely related to uncontrolled HTN and cocaine use.  He is asymptomatic and does not appear volume overloaded today. Counseled on need to stay off cocaine. Need to optimize BP control. Continue lisinopril. Once he is able to get assistance with medication would resume long acting nitrate with isosorbide and resume hydralazine. These should be titrated as tolerated to maximize BP control. Once he has been on good therapy for at least 3 months we should repeat and Echo. Low sodium diet. Medication adjustments can be done by primary care and I will follow up in 6 months. 2. Factor V Leiden deficiency. History of pulmonary embolus. On Xarelto. 3. HTN- uncontrolled.  4. Tobacco abuse. Encouraged smoking cessation. 5. Hypercholesterolemia. He should be considered for statin therapy given multiple vascular risk factors. 6. Family history of early CAD. 7. History of cocaine abuse. Need to avoid beta blocker.    Current medicines are reviewed at length with the patient today.  The patient does not have concerns regarding medicines.  The following changes have been made:  no change  Labs/ tests ordered today include: none   Disposition:   FU   6 months  Donald Brady Peter Martinique, MD,FACC  10/01/2015 11:11 AM    [facility] [address] [place], [place], [address] Phone [phone], Fax [phone]

## 2016-01-23 ENCOUNTER — Telehealth: Payer: Self-pay | Admitting: Internal Medicine

## 2016-01-23 ENCOUNTER — Telehealth: Payer: Self-pay

## 2016-01-23 NOTE — Telephone Encounter (Signed)
Donald Brady is a 40 y.o. male who was contacted via telephone for monitoring of rivaroxaban (Xarelto) therapy.    ASSESSMENT Indication(s): bilateral PE Duration: indefinite  Labs:    Component Value Date/Time   AST 24 08/19/2015 0623   ALT 31 08/19/2015 0623   NA 141 08/29/2015 1544   NA 138 08/19/2015 0623   K 4.4 08/29/2015 1544   CL 99 08/29/2015 1544   CO2 22 08/29/2015 1544   GLUCOSE 92 08/29/2015 1544   GLUCOSE 121* 08/19/2015 0623   HGBA1C 6.0* 08/19/2015 1525   HGBA1C 5.7 08/03/2013 1018   BUN 18 08/29/2015 1544   BUN 12 08/19/2015 0623   CREATININE 1.53* 08/29/2015 1544   CREATININE 1.49* 11/25/2014 1103   CALCIUM 9.5 08/29/2015 1544   GFRNONAA 57* 08/29/2015 1544   GFRNONAA 59* 11/25/2014 1103   GFRAA 66 08/29/2015 1544   GFRAA 68 11/25/2014 1103   WBC 7.8 08/19/2015 0623   HGB 17.9* 08/19/2015 0623   HCT 52.9* 08/19/2015 0623   PLT 252 08/19/2015 0623    rivaroxaban (Xarelto) Dose: 20 mg daily  Safety: Patient  had recent bleeding/thromboembolic events. Patient reports no recent signs or symptoms of bleeding, no signs of symptoms of thromboembolism. Medication changes: no.  Adherence: Patient has adherence challenges. Patient does correctly recite the dose. Contacted pharmacy and records indicate refills are not consistent. Patient stated he hasn't been taking this medication for "the past couple months" because he "has too many pills to take". Never got 1/13 prescription filled. Told patient there are ways we can help if cost is the barrier.   Patient Instructions: Patient advised to contact clinic or seek medical attention if signs/symptoms of bleeding or thromboembolism occur. Patient verbalized understanding by repeating back information.  Follow-up Next appointment: 01/26/16 with Internal Med   Angelena Form PharmD Candidate  01/23/2016, 2:33 PM

## 2016-01-23 NOTE — Telephone Encounter (Signed)
APT. REMINDER CALL, NO VOICEMAIL °

## 2016-01-26 ENCOUNTER — Ambulatory Visit (INDEPENDENT_AMBULATORY_CARE_PROVIDER_SITE_OTHER): Payer: No Typology Code available for payment source | Admitting: Pulmonary Disease

## 2016-01-26 ENCOUNTER — Encounter: Payer: Self-pay | Admitting: Pulmonary Disease

## 2016-01-26 VITALS — BP 149/104 | HR 90 | Temp 98.6°F | Wt 379.2 lb

## 2016-01-26 DIAGNOSIS — F32A Depression, unspecified: Secondary | ICD-10-CM

## 2016-01-26 DIAGNOSIS — I10 Essential (primary) hypertension: Secondary | ICD-10-CM

## 2016-01-26 DIAGNOSIS — I5022 Chronic systolic (congestive) heart failure: Secondary | ICD-10-CM

## 2016-01-26 DIAGNOSIS — M25561 Pain in right knee: Secondary | ICD-10-CM

## 2016-01-26 DIAGNOSIS — J45909 Unspecified asthma, uncomplicated: Secondary | ICD-10-CM

## 2016-01-26 DIAGNOSIS — I110 Hypertensive heart disease with heart failure: Secondary | ICD-10-CM

## 2016-01-26 DIAGNOSIS — F1020 Alcohol dependence, uncomplicated: Secondary | ICD-10-CM

## 2016-01-26 DIAGNOSIS — F1010 Alcohol abuse, uncomplicated: Secondary | ICD-10-CM | POA: Insufficient documentation

## 2016-01-26 DIAGNOSIS — H524 Presbyopia: Secondary | ICD-10-CM

## 2016-01-26 DIAGNOSIS — F329 Major depressive disorder, single episode, unspecified: Secondary | ICD-10-CM

## 2016-01-26 DIAGNOSIS — J4540 Moderate persistent asthma, uncomplicated: Secondary | ICD-10-CM

## 2016-01-26 MED ORDER — VENLAFAXINE HCL ER 37.5 MG PO CP24: 37.5000 mg | ORAL_CAPSULE | Freq: Every day | ORAL | Status: DC

## 2016-01-26 MED ORDER — HYDRALAZINE HCL 10 MG PO TABS: 10.0000 mg | ORAL_TABLET | Freq: Three times a day (TID) | ORAL | Status: DC

## 2016-01-26 MED ORDER — FUROSEMIDE 20 MG PO TABS: 20.0000 mg | ORAL_TABLET | Freq: Every day | ORAL | Status: DC

## 2016-01-26 MED ORDER — ISOSORBIDE MONONITRATE ER 30 MG PO TB24: 30.0000 mg | ORAL_TABLET | Freq: Every day | ORAL | Status: DC

## 2016-01-26 NOTE — Patient Instructions (Addendum)
Stop taking citalopram (Celexa). Start taking venlafaxine 1 tablet daily. You can increase it to 2 tabets daily after 4-7 days.  Start isosorbide and hydralazine when you can afford it.  Take Lasix daily for your leg swelling.  Follow up next week so we can get you alcohol detox. You may be contacted by [employer] (Education officer, [employer]) with other options if we find anything for you.

## 2016-01-26 NOTE — Assessment & Plan Note (Signed)
Assessment: R knee pain after injury in January. He saw Dr. Micheline Chapman early February for steroid injection which lasted 3 weeks. Pain recurred and is worsening.  Plan: Offered to order imaging for patient. Patient prefers to follow up with sports medicine.

## 2016-01-26 NOTE — Assessment & Plan Note (Signed)
Assessment: Alcohol abuse over the past few months. Drinks a lot of beer and liquor per day. Unable to tell me how much exactly. Drinks whatever he can get his hands on. Starts getting withdrawal symptoms if he goes for too long without alcohol - nausea, tremors. No seizures. Has been going to [facility]. Ready to quit alcohol.  Plan: Given his comorbid problems, he would be safest in an inpatient detoxification setting. He is unwilling to get admitted today and would like to come back next week to get admitted for detox. In the meantime, will ask social work if there are any other options available for him.  Advised him to not operate any heavy machinery or drive in the meantime.

## 2016-01-26 NOTE — Assessment & Plan Note (Signed)
Assessment: BP 149/104, above goal.  Plan: Continue lisinopril 40mg  daily Working with social work to obtain assistance for medications. Start hydralazine 10mg  TID and Imdur 30 mg daily when able.

## 2016-01-26 NOTE — Progress Notes (Signed)
Internal Medicine Clinic Attending  Case discussed with Dr. Krall at the time of the visit.  We reviewed the resident's history and exam and pertinent patient test results.  I agree with the assessment, diagnosis, and plan of care documented in the resident's note.  

## 2016-01-26 NOTE — Progress Notes (Signed)
Subjective:    Patient ID: Donald Brady, male    DOB: 06/12/1976, 40 y.o.   MRN: [id number]  HPI Donald Brady is a 40 year old man with history of nonischemic cardiomyopathy (uncontrolled HTN, cocaine), anxiety, asthma, HLD, GERD, Factor V Leiden deficiency/PE on Xarelto, L knee lateral meniscus tear, HTN, OSA presenting for follow up knee pain.  R knee pain - three weeks of relief but then pain recurred. Pain occurs all the time. Worsening over time. L knee now hurting too. Previously had problems in the knee. Wants to call Dr. Micheline Chapman for an appointment. Just taking tylenol for the pain. No redness or swelling of knee.   Wants eye referral. Has trouble with reading things close to his face. Tried getting reading glasses himself but wasn't able to find the right ones.   Drinks a lot of alcohol a day for months. Gets nausea and tremors when he tries to stop drinking. No SI/HI. Feels depressed. Feels a lot of anxiety. Has been going to [place]. No seizures. Ready to quit alcohol.   Review of Systems Constitutional: no fevers/chills Respiratory: +shortness of breath with exertion, +3 pillow orthopnea, +PND Cardiovascular: no chest pain Gastrointestinal: no vomiting, no abdominal pain, no constipation, no diarrhea  Past Medical History  Diagnosis Date  . Anxiety   . HLD (hyperlipidemia)   . History of seizures     no known cause, per pt. - has been "years" since last seizure; no longer on anticonvulsant  . Arthritis     left knee  . GERD (gastroesophageal reflux disease)   . History of pulmonary embolus (PE)     takes Xarelto  . Asthma     no inhaler use in 1 year  . Lateral meniscus tear 01/2015    left knee  . Hypertension     states BP "runs high"; has been on med. "a while"  . Sleep apnea     had sleep study 10/2013:  "severe" sleep apnea, states could not afford CPAP machine  . Factor V Leiden ([place])   . Shortness of breath dyspnea     Current Outpatient Prescriptions on File  Prior to Visit  Medication Sig Dispense Refill  . albuterol (PROVENTIL HFA;VENTOLIN HFA) 108 (90 Base) MCG/ACT inhaler Inhale 2 puffs into the lungs every 6 (six) hours as needed for wheezing or shortness of breath. 6.7 g 3  . citalopram (CELEXA) 40 MG tablet Take 1 tablet (40 mg total) by mouth daily. 30 tablet 11  . HYDROcodone-acetaminophen (NORCO/VICODIN) 5-325 MG tablet Take 1 tablet by mouth every 8 (eight) hours as needed for moderate pain. 15 tablet 0  . lisinopril (PRINIVIL,ZESTRIL) 40 MG tablet Take 1 tablet (40 mg total) by mouth daily. 30 tablet 11  . mometasone-formoterol (DULERA) 200-5 MCG/ACT AERO Inhale 2 puffs into the lungs 2 (two) times daily. 8.8 g 3  . naproxen (NAPROSYN) 500 MG tablet Take 1 tablet (500 mg total) by mouth 2 (two) times daily with a meal. 14 tablet 0  . rivaroxaban (XARELTO) 20 MG TABS tablet Take 1 tablet (20 mg total) by mouth daily with supper. 30 tablet 11  . [DISCONTINUED] hydrALAZINE (APRESOLINE) 10 MG tablet Take 1 tablet (10 mg total) by mouth 3 (three) times daily. (Patient not taking: Reported on 08/18/2015) 90 tablet 11  . [DISCONTINUED] isosorbide mononitrate (IMDUR) 30 MG 24 hr tablet Take 1 tablet (30 mg total) by mouth daily. (Patient not taking: Reported on 08/18/2015) 30 tablet 11  No current facility-administered medications on file prior to visit.      Objective:   Physical Exam Blood pressure 149/104, pulse 90, temperature 98.6 F (37 C), temperature source Oral, weight 379 lb 3.2 oz (172.004 kg), SpO2 96 %. General Apperance: NAD HEENT: Normocephalic, atraumatic, anicteric sclera Neck: Supple, trachea midline Lungs: Clear to auscultation bilaterally. No wheezes, rhonchi or rales. Breathing comfortably Heart: Regular rate and rhythm, no murmur/rub/gallop Abdomen: Soft, nontender, nondistended, no rebound/guarding Extremities: Warm and well perfused, 1+ pitting edema to mid shins Skin: No rashes or lesions Neurologic: Alert and  interactive. No gross deficits.    Assessment & Plan:  Please refer to problem based charting.

## 2016-01-26 NOTE — Assessment & Plan Note (Signed)
Assessment: Chronic worsening over time. Blurry vision with up close items. Having trouble with reading. Tried OTC reading glasses. Would like referral to optometry.  Plan: Referral to optometry.

## 2016-01-26 NOTE — Assessment & Plan Note (Signed)
Given albuterol inhaler sample (Lot [id number], exp 08/2017) and Dulera 200-5 sample (Lot [id number], exp 08/03/2016).

## 2016-01-26 NOTE — Assessment & Plan Note (Signed)
Assessment: PHQ9 20 with very difficult in terms of ability to do work, take care of things at home or get along with other people. Was doing well on Celexa. Self medicating with alcohol.   Plan: Discontinue Celexa Start Venlafaxine 37.5 mg daily and increase to 75mg  daily after 4-7 days. Follow up in 4-6 weeks.

## 2016-01-26 NOTE — Assessment & Plan Note (Signed)
Assessment: Dyspnea with exertion. +PND and 3 pillow orthopnea. Appears mildly fluid overloaded on exam. Weight is up to 379lb from 373lb on 2/15 and 369lb on 2/2.  Plan: Continue lisinopril. Start isosorbide/hydral when able. Lasix 20mg  PO daily. Obtain BMP at next visit.

## 2016-01-30 NOTE — Telephone Encounter (Signed)
Talked to pt about [facility] -stated he has the paper work already. Also mentioned if this doesn't work, he could come in next week to get admitted per Dr Randell Patient; then stated he might prefer the latter.  He has a couple of jobs this weekend and will call back Monday to let us know which option he prefers.

## 2016-01-30 NOTE — Telephone Encounter (Signed)
-----   Message from Milagros Loll, MD sent at 01/29/2016 10:47 AM EDT ----- Ollen Gross,  I tried calling Mr. Zipper but the line kept ringing.  Could you call and let him know that he can call: [place] ([facility]) at [phone] 1. Complete a telephone assessment to determine if he is eligible for the program 2. If bed is available and he is eligible, he would be scheduled a time to come in. 3. They are able to provide inpatient alcohol detox and accept financial assistance/uninsured.  If that doesn't work, he can still come in next week to get admitted for detox here.  Thanks, Jacques Earthly  ----- Message -----    From: Willow Ora, LCSW    Sent: 01/26/2016  12:11 PM      To: Bartholomew Crews, MD, Milagros Loll, MD   Mr. Fobbs would need to call: [place] ([facility]) at [phone] 1. Complete a telephone assessment to determine if he is eligible for the program 2. If bed is available and he is eligible, he would be scheduled a time to come in. 3. The are able to provide inpatient detox and accept financial assistance/uninsured.   ----- Message -----    From: Bartholomew Crews, MD    Sent: 01/26/2016  10:20 AM      To: Willow Ora, LCSW, Milagros Loll, MD  Edwena Blow Dr Randell Patient saw Mr Ohora this AM and he wants to detox from alcohol. He is at high risk for DT's so we did not feel comfortable doing this as outpt. Dr Randell Patient called [facility] but they are just a placement service apparently. The only other option we knew was inpt on our service - he agreed but needs to wait to next week. Do you know of other options? Thanks

## 2016-02-03 ENCOUNTER — Telehealth: Payer: Self-pay | Admitting: Licensed Clinical Social Worker

## 2016-02-03 NOTE — Telephone Encounter (Signed)
Donald Brady was referred to [facility] by front office staff.  Pt's mother requesting call from [place].  CSW placed called to pt.  CSW left message requesting return call. CSW provided contact hours and phone number.

## 2016-02-03 NOTE — Telephone Encounter (Signed)
CSW received return call from pt's mother, Ayzen Dondiego.  Ms. Musch requesting letter from physician to provide to DSS for pt to continue to receive his food stamp benefit.  CSW informed Ms. Siess request would be sent to PCP.  Mother states letter can be mailed/picked up, needed prior to 02/23/16 redetermination. CSW inquired if pt was in need of additional services or resources.  Mother denied.  CSW will continue to contact Mr. Mcgraw regarding services mention during his recent [facility] visit.

## 2016-02-04 ENCOUNTER — Encounter: Payer: Self-pay | Admitting: Internal Medicine

## 2016-02-04 NOTE — Telephone Encounter (Signed)
CSW placed call to [name] to inform pt his letter request has been approved and completed.  Pt aware letter placed in mail.  [name] states "I need help right now.  I'm drunk while I'm talking to you".  CSW discussed [facility] and their ability to provide inpatient detox.  Pt states he prefers to come to hospital for detox because he does not have insurance and does not have a way to [facility]  [name] states he has tried everything and it has not helped. CSW reassured [name] is county program available for the uninsured and if he is ready now, he should contact them today for potential placement.  CSW offered phone number to [facility].  Pt declines stating he has a packet of information and numbers he received from the hospital.  [name] states he needs to take care of some things at home and then he will make the call.

## 2016-02-12 ENCOUNTER — Ambulatory Visit: Payer: No Typology Code available for payment source | Admitting: Pharmacist

## 2016-02-27 ENCOUNTER — Emergency Department (HOSPITAL_COMMUNITY): Payer: Self-pay

## 2016-02-27 ENCOUNTER — Inpatient Hospital Stay (HOSPITAL_COMMUNITY)
Admission: EM | Admit: 2016-02-27 | Discharge: 2016-02-28 | DRG: 291 | Discharge status: Against Medical Advice | Payer: Self-pay | Attending: Internal Medicine | Admitting: Internal Medicine

## 2016-02-27 ENCOUNTER — Encounter (HOSPITAL_COMMUNITY): Payer: Self-pay | Admitting: Emergency Medicine

## 2016-02-27 DIAGNOSIS — G4733 Obstructive sleep apnea (adult) (pediatric): Secondary | ICD-10-CM | POA: Diagnosis present

## 2016-02-27 DIAGNOSIS — Z832 Family history of diseases of the blood and blood-forming organs and certain disorders involving the immune mechanism: Secondary | ICD-10-CM

## 2016-02-27 DIAGNOSIS — Z833 Family history of diabetes mellitus: Secondary | ICD-10-CM

## 2016-02-27 DIAGNOSIS — I5023 Acute on chronic systolic (congestive) heart failure: Secondary | ICD-10-CM | POA: Diagnosis present

## 2016-02-27 DIAGNOSIS — N183 Chronic kidney disease, stage 3 unspecified: Secondary | ICD-10-CM | POA: Diagnosis present

## 2016-02-27 DIAGNOSIS — Z8249 Family history of ischemic heart disease and other diseases of the circulatory system: Secondary | ICD-10-CM

## 2016-02-27 DIAGNOSIS — Z800 Family history of malignant neoplasm of digestive organs: Secondary | ICD-10-CM

## 2016-02-27 DIAGNOSIS — I509 Heart failure, unspecified: Secondary | ICD-10-CM

## 2016-02-27 DIAGNOSIS — I428 Other cardiomyopathies: Secondary | ICD-10-CM

## 2016-02-27 DIAGNOSIS — J9601 Acute respiratory failure with hypoxia: Secondary | ICD-10-CM | POA: Diagnosis present

## 2016-02-27 DIAGNOSIS — F329 Major depressive disorder, single episode, unspecified: Secondary | ICD-10-CM | POA: Diagnosis present

## 2016-02-27 DIAGNOSIS — Z7901 Long term (current) use of anticoagulants: Secondary | ICD-10-CM

## 2016-02-27 DIAGNOSIS — I10 Essential (primary) hypertension: Secondary | ICD-10-CM | POA: Diagnosis present

## 2016-02-27 DIAGNOSIS — Z9114 Patient's other noncompliance with medication regimen: Secondary | ICD-10-CM

## 2016-02-27 DIAGNOSIS — K219 Gastro-esophageal reflux disease without esophagitis: Secondary | ICD-10-CM | POA: Diagnosis present

## 2016-02-27 DIAGNOSIS — I429 Cardiomyopathy, unspecified: Secondary | ICD-10-CM | POA: Diagnosis present

## 2016-02-27 DIAGNOSIS — F1210 Cannabis abuse, uncomplicated: Secondary | ICD-10-CM | POA: Diagnosis present

## 2016-02-27 DIAGNOSIS — R000 Tachycardia, unspecified: Secondary | ICD-10-CM

## 2016-02-27 DIAGNOSIS — F32A Depression, unspecified: Secondary | ICD-10-CM | POA: Diagnosis present

## 2016-02-27 DIAGNOSIS — F1410 Cocaine abuse, uncomplicated: Secondary | ICD-10-CM | POA: Diagnosis present

## 2016-02-27 DIAGNOSIS — Z86718 Personal history of other venous thrombosis and embolism: Secondary | ICD-10-CM

## 2016-02-27 DIAGNOSIS — Z86711 Personal history of pulmonary embolism: Secondary | ICD-10-CM

## 2016-02-27 DIAGNOSIS — I130 Hypertensive heart and chronic kidney disease with heart failure and stage 1 through stage 4 chronic kidney disease, or unspecified chronic kidney disease: Principal | ICD-10-CM | POA: Diagnosis present

## 2016-02-27 DIAGNOSIS — D6851 Activated protein C resistance: Secondary | ICD-10-CM | POA: Diagnosis present

## 2016-02-27 DIAGNOSIS — F1490 Cocaine use, unspecified, uncomplicated: Secondary | ICD-10-CM | POA: Diagnosis present

## 2016-02-27 DIAGNOSIS — Z6841 Body Mass Index (BMI) 40.0 and over, adult: Secondary | ICD-10-CM

## 2016-02-27 DIAGNOSIS — F17210 Nicotine dependence, cigarettes, uncomplicated: Secondary | ICD-10-CM | POA: Diagnosis present

## 2016-02-27 DIAGNOSIS — E785 Hyperlipidemia, unspecified: Secondary | ICD-10-CM | POA: Diagnosis present

## 2016-02-27 DIAGNOSIS — Z801 Family history of malignant neoplasm of trachea, bronchus and lung: Secondary | ICD-10-CM

## 2016-02-27 DIAGNOSIS — J45909 Unspecified asthma, uncomplicated: Secondary | ICD-10-CM | POA: Diagnosis present

## 2016-02-27 LAB — CBC
HCT: 49.4 % (ref 39.0–52.0)
HEMOGLOBIN: 16.5 g/dL (ref 13.0–17.0)
MCH: 30.3 pg (ref 26.0–34.0)
MCHC: 33.4 g/dL (ref 30.0–36.0)
MCV: 90.8 fL (ref 78.0–100.0)
Platelets: 238 10*3/uL (ref 150–400)
RBC: 5.44 MIL/uL (ref 4.22–5.81)
RDW: 13.8 % (ref 11.5–15.5)
WBC: 9.4 10*3/uL (ref 4.0–10.5)

## 2016-02-27 LAB — BASIC METABOLIC PANEL
ANION GAP: 5 (ref 5–15)
BUN: 11 mg/dL (ref 6–20)
CALCIUM: 8.7 mg/dL — AB (ref 8.9–10.3)
CHLORIDE: 109 mmol/L (ref 101–111)
CO2: 26 mmol/L (ref 22–32)
Creatinine, Ser: 1.76 mg/dL — ABNORMAL HIGH (ref 0.61–1.24)
GFR calc Af Amer: 55 mL/min — ABNORMAL LOW (ref >60)
GFR, EST NON AFRICAN AMERICAN: 47 mL/min — AB (ref >60)
GLUCOSE: 111 mg/dL — AB (ref 65–99)
POTASSIUM: 3.8 mmol/L (ref 3.5–5.1)
Sodium: 140 mmol/L (ref 135–145)

## 2016-02-27 LAB — RAPID URINE DRUG SCREEN, HOSP PERFORMED
AMPHETAMINES: NOT DETECTED
Barbiturates: NOT DETECTED
Benzodiazepines: NOT DETECTED
Cocaine: POSITIVE — AB
Opiates: NOT DETECTED
TETRAHYDROCANNABINOL: POSITIVE — AB

## 2016-02-27 LAB — PROTIME-INR
INR: 1.07 (ref 0.00–1.49)
Prothrombin Time: 14.1 seconds (ref 11.6–15.2)

## 2016-02-27 LAB — I-STAT TROPONIN, ED: TROPONIN I, POC: 0.04 ng/mL (ref 0.00–0.08)

## 2016-02-27 LAB — BRAIN NATRIURETIC PEPTIDE: B NATRIURETIC PEPTIDE 5: 454.2 pg/mL — AB (ref 0.0–100.0)

## 2016-02-27 MED ORDER — FUROSEMIDE 10 MG/ML IJ SOLN: 20.0000 mg | Freq: Once | INTRAMUSCULAR | Status: DC

## 2016-02-27 MED ORDER — GUAIFENESIN-DM 100-10 MG/5ML PO SYRP: 5.0000 mL | ORAL_SOLUTION | ORAL | Status: DC | PRN

## 2016-02-27 MED ORDER — VENLAFAXINE HCL ER 37.5 MG PO CP24
37.5000 mg | ORAL_CAPSULE | Freq: Every day | ORAL | Status: DC
  Administered 2016-02-28: 37.5 mg via ORAL
  Filled 2016-02-27: qty 1

## 2016-02-27 MED ORDER — SODIUM CHLORIDE 0.9 % IV SOLN: 250.0000 mL | INTRAVENOUS | Status: DC | PRN

## 2016-02-27 MED ORDER — LORAZEPAM 1 MG PO TABS: 1.0000 mg | ORAL_TABLET | Freq: Four times a day (QID) | ORAL | Status: DC | PRN

## 2016-02-27 MED ORDER — RIVAROXABAN 20 MG PO TABS
20.0000 mg | ORAL_TABLET | Freq: Every day | ORAL | Status: DC
  Administered 2016-02-28: 20 mg via ORAL
  Filled 2016-02-27: qty 1

## 2016-02-27 MED ORDER — THIAMINE HCL 100 MG/ML IJ SOLN: 100.0000 mg | Freq: Every day | INTRAMUSCULAR | Status: DC

## 2016-02-27 MED ORDER — SODIUM CHLORIDE 0.9% FLUSH: 3.0000 mL | INTRAVENOUS | Status: DC | PRN

## 2016-02-27 MED ORDER — FOLIC ACID 1 MG PO TABS
1.0000 mg | ORAL_TABLET | Freq: Every day | ORAL | Status: DC
  Administered 2016-02-28: 1 mg via ORAL
  Filled 2016-02-27: qty 1

## 2016-02-27 MED ORDER — SODIUM CHLORIDE 0.9% FLUSH
3.0000 mL | Freq: Two times a day (BID) | INTRAVENOUS | Status: DC
  Administered 2016-02-28 (×2): 3 mL via INTRAVENOUS

## 2016-02-27 MED ORDER — ACETAMINOPHEN 325 MG PO TABS
650.0000 mg | ORAL_TABLET | ORAL | Status: DC | PRN
  Administered 2016-02-28: 650 mg via ORAL
  Filled 2016-02-27: qty 2

## 2016-02-27 MED ORDER — LISINOPRIL 20 MG PO TABS
40.0000 mg | ORAL_TABLET | Freq: Every day | ORAL | Status: DC
  Administered 2016-02-28: 40 mg via ORAL
  Filled 2016-02-27: qty 2

## 2016-02-27 MED ORDER — FUROSEMIDE 10 MG/ML IJ SOLN
20.0000 mg | Freq: Once | INTRAMUSCULAR | Status: AC
  Administered 2016-02-27: 20 mg via INTRAVENOUS
  Filled 2016-02-27: qty 2

## 2016-02-27 MED ORDER — VITAMIN B-1 100 MG PO TABS
100.0000 mg | ORAL_TABLET | Freq: Every day | ORAL | Status: DC
  Administered 2016-02-28: 100 mg via ORAL
  Filled 2016-02-27: qty 1

## 2016-02-27 MED ORDER — MOMETASONE FURO-FORMOTEROL FUM 200-5 MCG/ACT IN AERO
2.0000 | INHALATION_SPRAY | Freq: Two times a day (BID) | RESPIRATORY_TRACT | Status: DC
  Administered 2016-02-28: 2 via RESPIRATORY_TRACT
  Filled 2016-02-27: qty 8.8

## 2016-02-27 MED ORDER — ALBUTEROL SULFATE (2.5 MG/3ML) 0.083% IN NEBU
2.5000 mg | INHALATION_SOLUTION | Freq: Four times a day (QID) | RESPIRATORY_TRACT | Status: DC | PRN
  Administered 2016-02-28: 2.5 mg via RESPIRATORY_TRACT
  Filled 2016-02-27: qty 3

## 2016-02-27 MED ORDER — ADULT MULTIVITAMIN W/MINERALS CH
1.0000 | ORAL_TABLET | Freq: Every day | ORAL | Status: DC
  Administered 2016-02-28: 1 via ORAL
  Filled 2016-02-27: qty 1

## 2016-02-27 MED ORDER — LORAZEPAM 2 MG/ML IJ SOLN
1.0000 mg | Freq: Four times a day (QID) | INTRAMUSCULAR | Status: DC | PRN
  Administered 2016-02-28 (×2): 1 mg via INTRAVENOUS
  Filled 2016-02-27 (×2): qty 1

## 2016-02-27 NOTE — ED Notes (Signed)
Walked PT around POD C to POD D, O2 Dropped to 82% Pulse 131. Stopped PT while O2 State increase. Continue to walk PT back to room.

## 2016-02-27 NOTE — ED Provider Notes (Signed)
CSN: [id number]     Arrival date & time 02/27/16  2034 History   First MD Initiated Contact with Patient 02/27/16 2038     Chief Complaint  Patient presents with  . Shortness of Breath     (Consider location/radiation/quality/duration/timing/severity/associated sxs/prior Treatment) Patient is a 40 y.o. male presenting with shortness of breath. The history is provided by the patient.  Shortness of Breath Severity:  Moderate Onset quality:  Gradual Duration:  3 days Timing:  Constant Progression:  Worsening Chronicity:  Recurrent Context: activity   Relieved by:  Nothing Exacerbated by: lying flat. Ineffective treatments:  Diuretics Associated symptoms: no abdominal pain, no chest pain, no cough, no fever, no neck pain, no rash, no sore throat and no vomiting     Past Medical History  Diagnosis Date  . Anxiety   . HLD (hyperlipidemia)   . History of seizures     no known cause, per pt. - has been "years" since last seizure; no longer on anticonvulsant  . Arthritis     left knee  . GERD (gastroesophageal reflux disease)   . History of pulmonary embolus (PE)     takes Xarelto  . Asthma     no inhaler use in 1 year  . Lateral meniscus tear 01/2015    left knee  . Hypertension     states BP "runs high"; has been on med. "a while"  . Sleep apnea     had sleep study 10/2013:  "severe" sleep apnea, states could not afford CPAP machine  . Factor V Leiden ([place])   . Shortness of breath dyspnea    Past Surgical History  Procedure Laterality Date  . Video bronchoscopy Bilateral 12/06/2012    Procedure: VIDEO BRONCHOSCOPY WITHOUT FLUORO;  Surgeon: Juanito Doom, MD;  Location: [facility];  Service: Cardiopulmonary;  Laterality: Bilateral;  . Orif femur fracture Left   . Hand surgery Right     states knuckle removed  . Esophagogastroduodenoscopy (egd) with propofol  07/10/2014  . Cardiac catheterization N/A 02/19/2015    Procedure: Left Heart Cath and Coronary Angiography;   Surgeon: Peter M Martinique, MD;  Location: [facility];  Service: Cardiovascular;  Laterality: N/A;  . Chondroplasty Left 03/26/2015    Procedure: CHONDROPLASTY;  Surgeon: Leandrew Koyanagi, MD;  Location: [facility];  Service: Orthopedics;  Laterality: Left;  . Knee arthroscopy with lateral menisectomy Left 03/26/2015    Procedure: LEFT KNEE ARTHROSCOPY WITH LATERAL MENISECTOMY  CHONDROPLASTY;  Surgeon: Leandrew Koyanagi, MD;  Location: [facility];  Service: Orthopedics;  Laterality: Left;   Family History  Problem Relation Age of Onset  . Diabetes Mother   . Hypertension Mother   . Hearing loss Father     died in his 28's  . Diabetes Maternal Aunt   . Hypertension Maternal Aunt   . Diabetes Maternal Uncle   . Hypertension Maternal Uncle   . Cancer Father     unknown type  . Stomach cancer Maternal Uncle   . Lung cancer Paternal Barbaraann Rondo     was a smoker  . Breast cancer Maternal Aunt   . Stomach cancer Maternal Uncle   . Clotting disorder Father   . Heart disease Father   . Liver disease Maternal Uncle     drinker  . Kidney disease Maternal Aunt    Social History  Substance Use Topics  . Smoking status: Current Every Day Smoker -- 1.00 packs/day for 21 years  Types: Cigarettes  . Smokeless tobacco: Never Used     Comment: Stress.  . Alcohol Use: 0.0 oz/week    0 Standard drinks or equivalent per week     Comment: weekends    Review of Systems  Constitutional: Negative for fever and chills.  HENT: Negative for congestion and sore throat.   Eyes: Negative for pain.  Respiratory: Positive for shortness of breath. Negative for cough.   Cardiovascular: Positive for leg swelling. Negative for chest pain and palpitations.  Gastrointestinal: Negative for nausea, vomiting, abdominal pain and diarrhea.  Endocrine: Negative.   Genitourinary: Negative for flank pain.  Musculoskeletal: Negative for back pain and neck pain.  Skin: Negative for rash.   Allergic/Immunologic: Negative.   Neurological: Negative for dizziness, syncope and light-headedness.  Psychiatric/Behavioral: Negative for confusion.    Allergies  Bee venom; Honey; Shellfish allergy; Ibuprofen; and Betadine  Home Medications   Prior to Admission medications   Medication Sig Start Date End Date Taking? Authorizing Provider  albuterol (PROVENTIL HFA;VENTOLIN HFA) 108 (90 Base) MCG/ACT inhaler Inhale 2 puffs into the lungs every 6 (six) hours as needed for wheezing or shortness of breath. 08/29/15  Yes Alexa Angela Burke, MD  furosemide (LASIX) 20 MG tablet Take 1 tablet (20 mg total) by mouth daily. 01/26/16 01/25/17 Yes Milagros Loll, MD  lisinopril (PRINIVIL,ZESTRIL) 40 MG tablet Take 1 tablet (40 mg total) by mouth daily. 08/29/15  Yes Alexa Angela Burke, MD  rivaroxaban (XARELTO) 20 MG TABS tablet Take 1 tablet (20 mg total) by mouth daily with supper. 08/29/15  Yes Alexa Angela Burke, MD  venlafaxine XR (EFFEXOR-XR) 37.5 MG 24 hr capsule Take 1 capsule (37.5 mg total) by mouth daily with breakfast. 01/26/16  Yes Milagros Loll, MD  hydrALAZINE (APRESOLINE) 10 MG tablet Take 1 tablet (10 mg total) by mouth 3 (three) times daily. Patient not taking: Reported on 02/27/2016 01/26/16   Milagros Loll, MD  HYDROcodone-acetaminophen (NORCO/VICODIN) 5-325 MG tablet Take 1 tablet by mouth every 8 (eight) hours as needed for moderate pain. Patient not taking: Reported on 02/27/2016 09/15/15   Milagros Loll, MD  isosorbide mononitrate (IMDUR) 30 MG 24 hr tablet Take 1 tablet (30 mg total) by mouth daily. Patient not taking: Reported on 02/27/2016 01/26/16   Milagros Loll, MD  mometasone-formoterol [facility]) 200-5 MCG/ACT AERO Inhale 2 puffs into the lungs 2 (two) times daily. Patient not taking: Reported on 01/26/2016 08/29/15   Alexa Angela Burke, MD  naproxen (NAPROSYN) 500 MG tablet Take 1 tablet (500 mg total) by mouth 2 (two) times daily with a meal. Patient not taking: Reported on 02/27/2016  09/15/15   Milagros Loll, MD   BP 135/91 mmHg  Pulse 102  Temp(Src) 99 F (37.2 C) (Oral)  Resp 20  Ht 6\' 3"  (1.905 m)  Wt 173.818 kg  BMI 47.90 kg/m2  SpO2 96% Physical Exam  Constitutional: He is oriented to person, place, and time. He appears well-developed and well-nourished.  HENT:  Head: Normocephalic and atraumatic.  Eyes: Conjunctivae and EOM are normal. Pupils are equal, round, and reactive to light.  Neck: Normal range of motion. Neck supple.  Cardiovascular: Normal rate, regular rhythm, normal heart sounds and intact distal pulses.   Pulmonary/Chest: Effort normal. No respiratory distress. He has rales in the right lower field and the left lower field.  Abdominal: Soft. Bowel sounds are normal. There is no tenderness.  Musculoskeletal: Normal range of motion. He exhibits edema (plus 3 pitting  LEs).  Neurological: He is alert and oriented to person, place, and time. He has normal reflexes. No cranial nerve deficit.  Skin: Skin is warm and dry.    ED Course  Procedures (including critical care time) Labs Review Labs Reviewed  BASIC METABOLIC PANEL - Abnormal; Notable for the following:    Glucose, Bld 111 (*)    Creatinine, Ser 1.76 (*)    Calcium 8.7 (*)    GFR calc non Af Amer 47 (*)    GFR calc Af Amer 55 (*)    All other components within normal limits  BRAIN NATRIURETIC PEPTIDE - Abnormal; Notable for the following:    B Natriuretic Peptide 454.2 (*)    All other components within normal limits  URINE RAPID DRUG SCREEN, HOSP PERFORMED - Abnormal; Notable for the following:    Cocaine POSITIVE (*)    Tetrahydrocannabinol POSITIVE (*)    All other components within normal limits  CBC  PROTIME-INR  BASIC METABOLIC PANEL  TROPONIN I  Randolm Idol, ED    Imaging Review Dg Chest 2 View  02/27/2016  CLINICAL DATA:  Shortness breath.  Uncontrolled cough. EXAM: CHEST  2 VIEW COMPARISON:  Two-view chest x-ray 08/19/2015 FINDINGS: The heart size is  exaggerated by low lung volumes. Mild diffuse interstitial edema is again noted. Mild posterior lower lobe airspace disease is present bilaterally. Small effusions are suspected. The visualized soft tissues and bony thorax are unremarkable. IMPRESSION: 1. Interstitial edema and small effusions suggesting congestive heart failure. 2. Bibasilar airspace disease. While this likely reflects atelectasis, infection is not excluded. Electronically Signed   By: San Morelle M.D.   On: 02/27/2016 21:08   I have personally reviewed and evaluated these images and lab results as part of my medical decision-making.   EKG Interpretation None      MDM   Final diagnoses:  Congestive heart failure, unspecified congestive heart failure chronicity, unspecified congestive heart failure type (HCC)  Acute respiratory failure with hypoxia (HCC)  Tachycardia    The patient is a 40 year old male with a history of recurrent PEs on Xarelto and CHF presenting for increasing shortness of breath. Reports he has been noncompliant with his Lasix over the last month and over the last 2 days has had increasing frothy sputum with difficulty with lying flat. Reports compliance with Xarelto however.  Evaluation patient is hemodynamically stable with mild tachycardia and mild hypoxia. BNP elevated at 400 with clinical exam consistent with volume overload. Patient denies any pleuritic chest pain or feeling of PE at this time.   In setting of likely CHF and compliance with xerelto do not feel CTA PE warranted currently. No chest pain and low suspicion for ACS.  Chest x-ray with pulmonary edema. Given Lasix IV in the ED and consulted internal medicine for admission.  Labs, ECG, and CXR were viewed by myself and incorporated into medical decision making.  Discussed pertinent finding with patient or caregiver prior to admission with no further questions.  Pt care supervised by my attending Dr. Laneta Simmers.   Geronimo Boot,  MD PGY-3 Emergency Medicine     Geronimo Boot, MD 02/28/16 [id number]  Leo Grosser, MD 02/28/16 [phone]

## 2016-02-27 NOTE — H&P (Signed)
Date: 02/27/2016               Patient Name:  Donald Brady MRN: [id number]  DOB: [birth year] Age / Sex: 40 y.o., male   PCP: Donald Marker, MD         Medical Service: Internal Medicine Teaching Service         Attending Physician: Dr. Oval Linsey, MD    First Contact: Dr. Heber St. Brady Pager: [phone]  Second Contact: Dr. Arcelia Brady Pager: [phone]       After Hours (After 5p/  First Contact Pager: [phone]  weekends / holidays): Second Contact Pager: [phone]   Chief Complaint: "I laid down and couldn't breath, felt like I was drowning."  History of Present Illness: Donald Brady is a 40 year old man with history of systolic HF (EF [id number]), HTN, HL, asthma, OSA, and Factor V Leiden (c/b PE in 2013, on rivaroxaban) who presents with one day of worsening dyspnea.  Several weeks ago, Donald Brady said his breathing was the best it had been in many months.  Then, over the last week he developed a cough, and in the last 2-3 days began getting short of breath, coughing up frothy liquid, and noticed worsening leg swelling.  His breathing is worse when he lies flat, and he wakes up 2x per week at night gasping for breath.  He said he filled he lasix prescription yesterday when he thought he was retaining fluid, and took one dose.   In the ED, he had bilateral crackles in his lungs and desaturated with ambulation.  Diuresis was initiated with 20 mg IV lasix.  Last Sunday, Donald Brady had a good time with some friends.  This good time involved driving a lawnmower over jumps and through pools of muddy water, and was documented on cell phone video.  Additionally, he consumed 31 beers and around 12 oz of liquor over that course of that day, well above his typical 12 beers per day.  LHC 02/2015 nonobstructive CAD, moderate LV dysfunction (35-45%).  He has been noncompliant with medical therapy of  isosorbide mononitrate, hydralazine, and lasix, and says he was not aware of the prescriptions.  He has only been taking  lisinopril for HTN.  He relates his substance abuse to depression, exacerbated by the inability to see his daughter due to dispute with the mother.   Meds: No current facility-administered medications for this encounter.   Current Outpatient Prescriptions  Medication Sig Dispense Refill  . albuterol (PROVENTIL HFA;VENTOLIN HFA) 108 (90 Base) MCG/ACT inhaler Inhale 2 puffs into the lungs every 6 (six) hours as needed for wheezing or shortness of breath. 6.7 g 3  . furosemide (LASIX) 20 MG tablet Take 1 tablet (20 mg total) by mouth daily. 30 tablet 1  . lisinopril (PRINIVIL,ZESTRIL) 40 MG tablet Take 1 tablet (40 mg total) by mouth daily. 30 tablet 11  . rivaroxaban (XARELTO) 20 MG TABS tablet Take 1 tablet (20 mg total) by mouth daily with supper. 30 tablet 11  . venlafaxine XR (EFFEXOR-XR) 37.5 MG 24 hr capsule Take 1 capsule (37.5 mg total) by mouth daily with breakfast. 30 capsule 1  . hydrALAZINE (APRESOLINE) 10 MG tablet Take 1 tablet (10 mg total) by mouth 3 (three) times daily. (Patient not taking: Reported on 02/27/2016) 90 tablet 1  . HYDROcodone-acetaminophen (NORCO/VICODIN) 5-325 MG tablet Take 1 tablet by mouth every 8 (eight) hours as needed for moderate pain. (Patient not taking: Reported on 02/27/2016) 15 tablet  0  . isosorbide mononitrate (IMDUR) 30 MG 24 hr tablet Take 1 tablet (30 mg total) by mouth daily. (Patient not taking: Reported on 02/27/2016) 30 tablet 1  . mometasone-formoterol (DULERA) 200-5 MCG/ACT AERO Inhale 2 puffs into the lungs 2 (two) times daily. (Patient not taking: Reported on 01/26/2016) 8.8 g 3  . naproxen (NAPROSYN) 500 MG tablet Take 1 tablet (500 mg total) by mouth 2 (two) times daily with a meal. (Patient not taking: Reported on 02/27/2016) 14 tablet 0    Allergies: Allergies as of 02/27/2016 - Review Complete 02/27/2016  Allergen Reaction Noted  . Bee venom Anaphylaxis 11/27/2011  . Honey Anaphylaxis 11/27/2011  . Shellfish allergy Shortness Of  Breath and Swelling 11/27/2011  . Ibuprofen Hives and Swelling 11/27/2011  . Betadine [povidone iodine]  01/20/2015   Past Medical History  Diagnosis Date  . Anxiety   . HLD (hyperlipidemia)   . History of seizures     no known cause, per pt. - has been "years" since last seizure; no longer on anticonvulsant  . Arthritis     left knee  . GERD (gastroesophageal reflux disease)   . History of pulmonary embolus (PE)     takes Xarelto  . Asthma     no inhaler use in 1 year  . Lateral meniscus tear 01/2015    left knee  . Hypertension     states BP "runs high"; has been on med. "a while"  . Sleep apnea     had sleep study 10/2013:  "severe" sleep apnea, states could not afford CPAP machine  . Factor V Leiden ([place])   . Shortness of breath dyspnea     Family History: Father died in 52s of MI.  Social History: Current polysubtance abuse.  Drinks at least 12 beers per day, with binges, has withdrawal symptoms of tremor, nausea, anxiety if he stops drinking.  Frequent marijuana user.  Last cocaine use 10 days ago.  Current 1 ppd smoker, ~25 pack year history.  Review of Systems: A complete ROS was negative except as per HPI.  Physical Exam: Blood pressure 144/82, pulse 104, temperature 99.1 F (37.3 C), temperature source Oral, resp. rate 25, height 6\' 3"  (1.905 m), weight 380 lb (172.367 kg), SpO2 94 %.    Physical Exam  Constitutional: He is oriented to person, place, and time.  Gregarious, obese man in no acute distress  HENT:  Head: Normocephalic and atraumatic.  Mouth/Throat: Oropharynx is clear and moist.  Eyes: Conjunctivae are normal. No scleral icterus.  Neck: Normal range of motion. Neck supple. No JVD present.  Cardiovascular: Normal rate, regular rhythm, normal heart sounds and intact distal pulses.  Exam reveals no gallop and no friction rub.   No murmur heard. Pulmonary/Chest: Effort normal and breath sounds normal. No respiratory distress. He has no wheezes. He has  no rales. He exhibits no tenderness.  Abdominal: Soft. He exhibits no distension. There is no tenderness.  Musculoskeletal:  Trace pedal edema  Neurological: He is alert and oriented to person, place, and time. He exhibits normal muscle tone. Coordination normal.  Psychiatric: His speech is normal and behavior is normal. Judgment and thought content normal. Cognition and memory are normal.  Affect jovial, mood depressed.    CXR: Interstitial edema and small bilateral pleural effusions suggestive of CHF  CBC Latest Ref Rng 02/27/2016 08/19/2015 08/18/2015  WBC 4.0 - 10.5 K/uL 9.4 7.8 8.0  Hemoglobin 13.0 - 17.0 g/dL 16.5 17.9(H) 17.0  Hematocrit 39.0 -  52.0 % 49.4 52.9(H) 49.8  Platelets 150 - 400 K/uL 238 252 249   BMP Latest Ref Rng 02/27/2016 08/29/2015 08/19/2015  Glucose 65 - 99 mg/dL 111(H) 92 121(H)  BUN 6 - 20 mg/dL 11 18 12   Creatinine 0.61 - 1.24 mg/dL 1.76(H) 1.53(H) 2.05(H)  BUN/Creat Ratio 8 - 19 - 12 -  Sodium 135 - 145 mmol/L 140 141 138  Potassium 3.5 - 5.1 mmol/L 3.8 4.4 4.0  Chloride 101 - 111 mmol/L 109 99 103  CO2 22 - 32 mmol/L 26 22 24   Calcium 8.9 - 10.3 mg/dL 8.7(L) 9.5 8.7(L)    BNP (last 3 results)  Recent Labs  08/19/15 0624 02/27/16 2056  BNP 208.6* 454.2*   Troponin (Point of Care Test)  Recent Labs  02/27/16 2102  TROPIPOC 0.04   Cardiac Panel (last 3 results)  Recent Labs  02/27/16 2315  TROPONINI 0.07*   Drugs of Abuse     Component Value Date/Time   LABOPIA NONE DETECTED 02/27/2016 2315   LABOPIA NEG 11/06/2014 1024   COCAINSCRNUR POSITIVE* 02/27/2016 2315   COCAINSCRNUR NEG 11/06/2014 1024   LABBENZ NONE DETECTED 02/27/2016 2315   LABBENZ NEG 11/06/2014 1024   AMPHETMU NONE DETECTED 02/27/2016 2315   AMPHETMU NEG 11/06/2014 1024   THCU POSITIVE* 02/27/2016 2315   THCU PPS 11/06/2014 1024   THCU 103* 11/06/2014 1024   LABBARB NONE DETECTED 02/27/2016 2315   LABBARB NEG 11/06/2014 1024       Assessment & Plan by  Problem: Active Problems:   Long term current use of anticoagulant therapy   Essential hypertension, benign   Factor 5 Leiden mutation, heterozygous (HCC)   GERD (gastroesophageal reflux disease)   Depression   Cocaine use   Nonobstructive cardiomyopathy (HCC)   CKD (chronic kidney disease), stage III   CHF exacerbation (HCC)  #CHF Exacerbation Dyspnea, orthopnea, and PND likely due to CHF exacerbation in context of medication noncompliance and binge drinking.  Responded well to initial diuresis in ED, with improvement in exam dyspnea when I first saw him.  PE is also a concern with dyspnea and personal history of PE, but patient is on DOAC and reports compliance.  ACS less likely with lack of chest pain and no exertional symptoms. -Diuresis with 20mg  lasix IV, repeat BMP in AM -Trend troponins -Echo -I/Os and daily weights -Start non-complaint home HTN meds if BP will tolerate -Avoid BB due to cocaine use and concern for unopposed alpha -Consider indication for spironolactone after echo  #Depression -Continue home meds  #Polysubstance Abuse May required scheduled benzo,  assess tomorrow.  Advised to quit alcohol and cocaine. -SW consult -CIWA  #Coagulopathy -Continue rivaroxaban  #Asthma -Continue home meds  #OSA Does not use CPAP/BiPAP at home due to cost -CPAP  Dispo: Admit patient to Inpatient with expected length of stay greater than 2 midnights.  Signed: Minus Liberty, MD 02/27/2016, 10:23 PM  Pager: [phone]

## 2016-02-27 NOTE — ED Notes (Signed)
Pt brought to ED by [facility] for SOB, pt states he is been having on and off SOB since last night, had an episode of uncontrolled cough at home and started getting on resp distress, Albuterol and Atrovent given by EMS PTA to ED and some O2 2L. Hx of PE, Asthma, on blood thinners at home for PE and Lasix. BP152/100, R 22, HR 116, SPO2 97% on neb treatment. Pt denies any pain or discomfort at this time.

## 2016-02-28 ENCOUNTER — Inpatient Hospital Stay (HOSPITAL_COMMUNITY): Payer: Self-pay

## 2016-02-28 DIAGNOSIS — Z7901 Long term (current) use of anticoagulants: Secondary | ICD-10-CM

## 2016-02-28 DIAGNOSIS — J45909 Unspecified asthma, uncomplicated: Secondary | ICD-10-CM

## 2016-02-28 DIAGNOSIS — D689 Coagulation defect, unspecified: Secondary | ICD-10-CM

## 2016-02-28 DIAGNOSIS — I509 Heart failure, unspecified: Secondary | ICD-10-CM

## 2016-02-28 DIAGNOSIS — G4733 Obstructive sleep apnea (adult) (pediatric): Secondary | ICD-10-CM

## 2016-02-28 DIAGNOSIS — I5023 Acute on chronic systolic (congestive) heart failure: Secondary | ICD-10-CM | POA: Insufficient documentation

## 2016-02-28 DIAGNOSIS — F329 Major depressive disorder, single episode, unspecified: Secondary | ICD-10-CM

## 2016-02-28 DIAGNOSIS — Z9114 Patient's other noncompliance with medication regimen: Secondary | ICD-10-CM

## 2016-02-28 DIAGNOSIS — F1020 Alcohol dependence, uncomplicated: Secondary | ICD-10-CM

## 2016-02-28 DIAGNOSIS — F1410 Cocaine abuse, uncomplicated: Secondary | ICD-10-CM

## 2016-02-28 LAB — TROPONIN I
TROPONIN I: 0.06 ng/mL — AB (ref <0.03)
Troponin I: 0.07 ng/mL (ref <0.03)

## 2016-02-28 LAB — BASIC METABOLIC PANEL
Anion gap: 7 (ref 5–15)
BUN: 10 mg/dL (ref 6–20)
CALCIUM: 8.6 mg/dL — AB (ref 8.9–10.3)
CO2: 25 mmol/L (ref 22–32)
CREATININE: 1.76 mg/dL — AB (ref 0.61–1.24)
Chloride: 110 mmol/L (ref 101–111)
GFR calc non Af Amer: 47 mL/min — ABNORMAL LOW (ref >60)
GFR, EST AFRICAN AMERICAN: 55 mL/min — AB (ref >60)
GLUCOSE: 114 mg/dL — AB (ref 65–99)
Potassium: 3.9 mmol/L (ref 3.5–5.1)
Sodium: 142 mmol/L (ref 135–145)

## 2016-02-28 LAB — ECHOCARDIOGRAM COMPLETE
Height: 75 in
Weight: 6131.2 oz

## 2016-02-28 LAB — HIV ANTIBODY (ROUTINE TESTING W REFLEX): HIV Screen 4th Generation wRfx: NONREACTIVE

## 2016-02-28 MED ORDER — FUROSEMIDE 10 MG/ML IJ SOLN: 20.0000 mg | Freq: Once | INTRAMUSCULAR | Status: DC

## 2016-02-28 NOTE — Progress Notes (Signed)
  Echocardiogram 2D Echocardiogram has been performed.  Donald Brady 02/28/2016, 9:16 AM

## 2016-02-28 NOTE — Progress Notes (Signed)
3E02 Refused to wait to see the doctor.  Notified MD prior pt leaving.    Keyston Ardolino,RN.

## 2016-02-28 NOTE — Progress Notes (Signed)
Dr Heber [place] on the floor to see pt and informed her that pt just left AMA .  Karie Kirks, Therapist, sports.

## 2016-02-28 NOTE — Progress Notes (Signed)
Refused bed alarm. Will continue to monitor patient. 

## 2016-02-28 NOTE — Progress Notes (Signed)
Pt requesting to see MD or will leave on his own.  Dr. Heber [place] made aware.  Karie Kirks, Therapist, sports.

## 2016-02-28 NOTE — Progress Notes (Signed)
Pt A&0x4.  On CIWA protocol.  Denies feeling week or unsteady on feet.  Noted to be falling asleep on and off when talking to pt.  Refused to have bed alarm set, yellow sock for safety and yellow arm band.  Instructed to call for assistance.  Verbalized understanding.  Karie Kirks, Therapist, sports.

## 2016-03-02 NOTE — Discharge Summary (Signed)
Name: Donald Brady MRN: [id number] DOB: [birth year] 40 y.o. PCP: Florinda Marker, MD  Date of Admission: 02/27/2016  8:34 PM Date of Discharge: 02/28/2016 Attending Physician: Dr. Eppie Gibson  Patient left Again Medical Advice 7/15  Discharge Diagnosis: 1. Acute on chronic systolic heart failure (HCC)   Discharge Medications:   Medication List    ASK your doctor about these medications        albuterol 108 (90 Base) MCG/ACT inhaler  Commonly known as:  PROVENTIL HFA;VENTOLIN HFA  Inhale 2 puffs into the lungs every 6 (six) hours as needed for wheezing or shortness of breath.     furosemide 20 MG tablet  Commonly known as:  LASIX  Take 1 tablet (20 mg total) by mouth daily.     hydrALAZINE 10 MG tablet  Commonly known as:  APRESOLINE  Take 1 tablet (10 mg total) by mouth 3 (three) times daily.     HYDROcodone-acetaminophen 5-325 MG tablet  Commonly known as:  NORCO/VICODIN  Take 1 tablet by mouth every 8 (eight) hours as needed for moderate pain.     isosorbide mononitrate 30 MG 24 hr tablet  Commonly known as:  IMDUR  Take 1 tablet (30 mg total) by mouth daily.     lisinopril 40 MG tablet  Commonly known as:  PRINIVIL,ZESTRIL  Take 1 tablet (40 mg total) by mouth daily.     mometasone-formoterol 200-5 MCG/ACT Aero  Commonly known as:  DULERA  Inhale 2 puffs into the lungs 2 (two) times daily.     naproxen 500 MG tablet  Commonly known as:  NAPROSYN  Take 1 tablet (500 mg total) by mouth 2 (two) times daily with a meal.     rivaroxaban 20 MG Tabs tablet  Commonly known as:  XARELTO  Take 1 tablet (20 mg total) by mouth daily with supper.     venlafaxine XR 37.5 MG 24 hr capsule  Commonly known as:  EFFEXOR-XR  Take 1 capsule (37.5 mg total) by mouth daily with breakfast.        Disposition and follow-up:   Donald Brady was discharged from [facility] in stable condition.  At the hospital follow up visit please address:  1.  Results of  hospital Echo on 7/15.  Importance of compliance with Heart failure medication, cocaine cessation counseling.  2.  Labs / imaging needed at time of follow-up: none  3.  Pending labs/ test needing follow-up: none  Follow-up Appointments:   Hospital Course by problem list: 1.  Acute on chronic systolic heart failure Mr. Milner presents with a several day history of increasing dyspnea on exertion, orthopnea, and paroxysmal nocturnal dyspnea after a weekend alcohol binge with his friends.  He admits to being noncompliant with his cardiac medications including Lasix.  BNP 454.2, troponin 0.07, 0.06 He was positive for cocaine and THC.  An echo was completed in hospital and showed LV EF: 35-40%.  Chest x-ray showed interstitial edema and small effusions suggesting congestive heart failure.  He was given IV Lasix 20mg  and his symptoms improved, however, he left Against Medical Advice and did not complete IV lasix treatment.    Discharge Vitals:   BP 139/92 mmHg  Pulse 102  Temp(Src) 98.5 F (36.9 C) (Oral)  Resp 20  Ht 6\' 3"  (1.905 m)  Wt 383 lb 3.2 oz (173.818 kg)  BMI 47.90 kg/m2  SpO2 92%  Pertinent Labs, Studies, and Procedures:  Echo, Chest X-ray  Discharge Instructions:  Patient left against medical advice   Signed: Valinda Party, DO 03/02/2016, 7:31 PM   Pager: [phone]

## 2016-03-03 ENCOUNTER — Ambulatory Visit (INDEPENDENT_AMBULATORY_CARE_PROVIDER_SITE_OTHER): Payer: Self-pay | Admitting: Internal Medicine

## 2016-03-03 VITALS — BP 129/99 | HR 99 | Temp 97.0°F | Wt 375.5 lb

## 2016-03-03 DIAGNOSIS — F32A Depression, unspecified: Secondary | ICD-10-CM

## 2016-03-03 DIAGNOSIS — I5022 Chronic systolic (congestive) heart failure: Secondary | ICD-10-CM

## 2016-03-03 DIAGNOSIS — F329 Major depressive disorder, single episode, unspecified: Secondary | ICD-10-CM

## 2016-03-03 MED ORDER — VENLAFAXINE HCL ER 37.5 MG PO CP24: ORAL_CAPSULE | ORAL | Status: DC

## 2016-03-03 MED FILL — ISOSORBIDE MN ER 30 MG TAB: 30 | 30 days supply | Qty: 30 | Fill #0

## 2016-03-03 MED FILL — LISINOPRIL 40 MG TABLET: 40 | 30 days supply | Qty: 30 | Fill #0

## 2016-03-03 MED FILL — VENLAFAXINE HCL ER 37.5 MG: 37.5 | 30 days supply | Qty: 53 | Fill #0

## 2016-03-03 MED FILL — hydrALAZINE HCL 10 MG TABS: 10 | 30 days supply | Qty: 90 | Fill #0

## 2016-03-03 MED FILL — FUROSEMIDE 20 MG TABLET: 20 | 30 days supply | Qty: 30 | Fill #0

## 2016-03-03 NOTE — Progress Notes (Signed)
Patient ID: Donald Brady, male   DOB: 19-Jun-1976, 40 y.o.   MRN: [id number]   CC: hospital follow up CHF  HPI:  [name] is a 40 y.o. man with Hx as below presenting for follow up for CHF and his depression.  Please see A&P for status of medical conditions addressed today.  Past Medical History  Diagnosis Date  . Anxiety   . HLD (hyperlipidemia)   . History of seizures     no known cause, per pt. - has been "years" since last seizure; no longer on anticonvulsant  . Arthritis     left knee  . GERD (gastroesophageal reflux disease)   . History of pulmonary embolus (PE)     takes Xarelto  . Asthma     no inhaler use in 1 year  . Lateral meniscus tear 01/2015    left knee  . Hypertension     states BP "runs high"; has been on med. "a while"  . Sleep apnea     had sleep study 10/2013:  "severe" sleep apnea, states could not afford CPAP machine  . Factor V Leiden ([place])   . Shortness of breath dyspnea     Review of Systems:   Review of Systems  Constitutional: Negative for fever and chills.  Respiratory: Negative for shortness of breath.   Cardiovascular: Positive for orthopnea (stable). Negative for chest pain and leg swelling.     Physical Exam:  Filed Vitals:   03/03/16 0838  BP: 129/99  Pulse: 99  Temp: 97 F (36.1 C)  TempSrc: Oral  Weight: 375 lb 8 oz (170.326 kg)  SpO2: 97%   Physical Exam  Constitutional: He is oriented to person, place, and time and well-developed, well-nourished, and in no distress.  HENT:  Head: Normocephalic and atraumatic.  Cardiovascular: Normal rate and regular rhythm.   Pulmonary/Chest: Effort normal and breath sounds normal. He has no wheezes. He has no rales.  Musculoskeletal: He exhibits edema (trace pitting edema).  Neurological: He is alert and oriented to person, place, and time.     Assessment & Plan:   See encounters tab for problem based medical decision making.   Patient discussed with Dr. Lynnae January   Chronic  systolic CHF (congestive heart failure) ([place]) A: Pt here for hospital f/u for CHF exacerbation.  Echo this admission shows EF 35-40%.  Patient has been taking Lasix and Lisinopril but has not yet obtained hydralazine or Imdur.  Since leaving the hospital he reports "feeling good".  No trouble with breathing, no chest pain.  Stable orthopnea that he uses multiple pillows for.  LUngs are clear on exam and only trace pitting edema of lower extremities.  He reports only having 3 beers this whole week since discharge and reports abstaining from cocaine for many months, however, his UDS from admission was positive for cocaine.  P: - I had our pharmacy staff meet with patient to facilitate access to medications and enable him to acquire hydralazine and Imdur - continue current regimen: Lasix 20mg  daily, Lisinopril 40mg  daily.  Start hydralazine 10mg  TID and Imdur 30mg  daily when he obtains them - f/u with PCP 4-6 weeks - f/u with cardiology as well - encouraged continued abstinence from cocaine and EtOH.  Cocaine use is the reason BB is being avoided by cardiology per their latest notes.  Depression Depression screen [facility] 2/9 03/03/2016 01/26/2016 01/26/2016 09/15/2015 08/29/2015  Decreased Interest 0 3 0 0 0  Down, Depressed, Hopeless 1 3 1  0 0  PHQ - 2 Score 1 6 1  0 0  Altered sleeping - 3 - - -  Tired, decreased energy - 3 - - -  Change in appetite - 2 - - -  Feeling bad or failure about yourself  - 3 - - -  Trouble concentrating - 3 - - -  Moving slowly or fidgety/restless - 0 - - -  Suicidal thoughts - 0 - - -  PHQ-9 Score - 20 - - -  Difficult doing work/chores - Very difficult - - -   A:  PHQ today is only 2 but was 20 last month.  Was supposed to stop Celexa and start Effexor but this did not happen.  He states Celexa still doesn't work and would like to try Effexor.  P: - instructed to taper off Celexa by halving his dose for 1 week then starting Effexor 37.5mg  daily for a week then  increasing to 75mg  daily after. - f/u with PCP in 4-6 weeks.

## 2016-03-03 NOTE — Assessment & Plan Note (Signed)
A: Pt here for hospital f/u for CHF exacerbation.  Echo this admission shows EF 35-40%.  Patient has been taking Lasix and Lisinopril but has not yet obtained hydralazine or Imdur.  Since leaving the hospital he reports "feeling good".  No trouble with breathing, no chest pain.  Stable orthopnea that he uses multiple pillows for.  LUngs are clear on exam and only trace pitting edema of lower extremities.  He reports only having 3 beers this whole week since discharge and reports abstaining from cocaine for many months, however, his UDS from admission was positive for cocaine.  P: - I had our pharmacy staff meet with patient to facilitate access to medications and enable him to acquire hydralazine and Imdur - continue current regimen: Lasix 20mg  daily, Lisinopril 40mg  daily.  Start hydralazine 10mg  TID and Imdur 30mg  daily when he obtains them - f/u with PCP 4-6 weeks - f/u with cardiology as well - encouraged continued abstinence from cocaine and EtOH.  Cocaine use is the reason BB is being avoided by cardiology per their latest notes.

## 2016-03-03 NOTE — Progress Notes (Unsigned)
Patient ID: Donald Brady, male   DOB: 10-24-1975, 40 y.o.   MRN: [id number] Medication Samples have been provided to the patient.  Drug: Xarelto Strength: 20mg  tablet Qty: 3 bottles (21 tablets) LOT: [id number] Exp.Date: 03/2018 Dosing instructions: Take one tablet by mouth once daily with supper.  Drug: Ruthe Mannan Strength: 220mcg/5mcg Qty: 1 inhaler  LOT: [id number] Exp.Date: 08/03/2016 Dosing instructions: Inhale two puffs twice a day.  The patient has been instructed regarding the correct time, dose, and frequency of taking this medication, including desired effects and most common side effects.   Collaborated with outpatient pharmacy to obtain supply of all other medications. Completed paperwork for Effexor PAP and [place] [facility] enrollment.  Samples signed for by Dr. Maudie Mercury.  [name] [name] 10:26 AM 03/03/2016

## 2016-03-03 NOTE — Assessment & Plan Note (Signed)
Depression screen [facility] 2/9 03/03/2016 01/26/2016 01/26/2016 09/15/2015 08/29/2015  Decreased Interest 0 3 0 0 0  Down, Depressed, Hopeless 1 3 1  0 0  PHQ - 2 Score 1 6 1  0 0  Altered sleeping - 3 - - -  Tired, decreased energy - 3 - - -  Change in appetite - 2 - - -  Feeling bad or failure about yourself  - 3 - - -  Trouble concentrating - 3 - - -  Moving slowly or fidgety/restless - 0 - - -  Suicidal thoughts - 0 - - -  PHQ-9 Score - 20 - - -  Difficult doing work/chores - Very difficult - - -   A:  PHQ today is only 2 but was 20 last month.  Was supposed to stop Celexa and start Effexor but this did not happen.  He states Celexa still doesn't work and would like to try Effexor.  P: - instructed to taper off Celexa by halving his dose for 1 week then starting Effexor 37.5mg  daily for a week then increasing to 75mg  daily after. - f/u with PCP in 4-6 weeks.

## 2016-03-03 NOTE — Patient Instructions (Signed)
Thank you for coming to see me today. It was a pleasure. Today we talked about:   Heart Failure: - please schedule an appointment with your heart doctor as soon as possible. - please take your medications as prescribed.  Our pharmacist is working with you to get access to your meds. - continue to abstain from alcohol and other substances.   You are doing a great job with this.  Depression: - taper your dose of Celexa.  Take 1/2 your dose for a week and then transition to Effexor. - after tapering Celexa, begin taking Effexor with 37.5mg  daily for 1 week, then start taking 75mg  daily after that.  Please follow-up with your primary doctor in 4-6 weeks.  If you have any questions or concerns, please do not hesitate to call the office at [phone].  Take Care,   Jule Ser, DO

## 2016-03-05 NOTE — Progress Notes (Signed)
Internal Medicine Clinic Attending  Case discussed with Dr. Wallace at the time of the visit.  We reviewed the resident's history and exam and pertinent patient test results.  I agree with the assessment, diagnosis, and plan of care documented in the resident's note.  

## 2016-03-15 ENCOUNTER — Ambulatory Visit: Payer: Self-pay

## 2016-03-18 ENCOUNTER — Other Ambulatory Visit: Payer: Self-pay | Admitting: Pharmacist

## 2016-03-18 DIAGNOSIS — Z7901 Long term (current) use of anticoagulants: Secondary | ICD-10-CM

## 2016-03-18 DIAGNOSIS — J4540 Moderate persistent asthma, uncomplicated: Secondary | ICD-10-CM

## 2016-03-18 DIAGNOSIS — J45909 Unspecified asthma, uncomplicated: Secondary | ICD-10-CM

## 2016-03-18 DIAGNOSIS — I10 Essential (primary) hypertension: Secondary | ICD-10-CM

## 2016-03-18 DIAGNOSIS — I2699 Other pulmonary embolism without acute cor pulmonale: Secondary | ICD-10-CM

## 2016-03-18 MED ORDER — ISOSORBIDE MONONITRATE ER 30 MG PO TB24: 30.0000 mg | ORAL_TABLET | Freq: Every day | ORAL | 0 refills | Status: DC

## 2016-03-18 MED ORDER — LISINOPRIL 40 MG PO TABS: 40.0000 mg | ORAL_TABLET | Freq: Every day | ORAL | 3 refills | Status: DC

## 2016-03-18 MED ORDER — MOMETASONE FURO-FORMOTEROL FUM 200-5 MCG/ACT IN AERO: 2.0000 | INHALATION_SPRAY | Freq: Two times a day (BID) | RESPIRATORY_TRACT | 3 refills | Status: DC

## 2016-03-18 MED ORDER — ALBUTEROL SULFATE HFA 108 (90 BASE) MCG/ACT IN AERS: 2.0000 | INHALATION_SPRAY | Freq: Four times a day (QID) | RESPIRATORY_TRACT | 3 refills | Status: DC | PRN

## 2016-03-18 MED ORDER — HYDRALAZINE HCL 10 MG PO TABS: 10.0000 mg | ORAL_TABLET | Freq: Three times a day (TID) | ORAL | 0 refills | Status: DC

## 2016-03-18 MED ORDER — RIVAROXABAN 20 MG PO TABS: 20.0000 mg | ORAL_TABLET | Freq: Every day | ORAL | 3 refills | Status: AC

## 2016-03-18 MED ORDER — FUROSEMIDE 20 MG PO TABS: 20.0000 mg | ORAL_TABLET | Freq: Every day | ORAL | 0 refills | Status: DC

## 2016-03-18 NOTE — Progress Notes (Unsigned)
Patient approved for [facility] [facility]. Prescriptions transferred.

## 2016-03-24 ENCOUNTER — Ambulatory Visit: Payer: Self-pay

## 2016-04-01 ENCOUNTER — Encounter: Payer: Self-pay | Admitting: Student-PharmD

## 2016-04-01 NOTE — Progress Notes (Unsigned)
Verified pt enrollment in [place] PAP for Effexor; pt enrolled until 03/2017 with option to re-enroll any time before 12/2016  Terald Sleeper PharmD Candidate, c/o 2019 04/01/2016 3:22 PM

## 2016-04-12 DIAGNOSIS — F32A Depression, unspecified: Secondary | ICD-10-CM

## 2016-04-12 DIAGNOSIS — F329 Major depressive disorder, single episode, unspecified: Secondary | ICD-10-CM

## 2016-04-12 NOTE — Patient Instructions (Signed)
Venlafaxine extended-release capsules  What is this medicine?  VENLAFAXINE(VEN la fax een) is used to treat depression, anxiety and panic disorder.  This medicine may be used for other purposes; ask your health care provider or pharmacist if you have questions.  What should I tell my health care provider before I take this medicine?  They need to know if you have any of these conditions:  -bleeding disorders  -glaucoma  -heart disease  -high blood pressure  -high cholesterol  -kidney disease  -liver disease  -low levels of sodium in the blood  -mania or bipolar disorder  -seizures  -suicidal thoughts, plans, or attempt; a previous suicide attempt by you or a family  -take medicines that treat or prevent blood clots  -thyroid disease  -an unusual or allergic reaction to venlafaxine, desvenlafaxine, other medicines, foods, dyes, or preservatives  -pregnant or trying to get pregnant  -breast-feeding  How should I use this medicine?  Take this medicine by mouth with a full glass of water. Follow the directions on the prescription label. Do not cut, crush, or chew this medicine. Take it with food. If needed, the capsule may be carefully opened and the entire contents sprinkled on a spoonful of cool applesauce. Swallow the applesauce/pellet mixture right away without chewing and follow with a glass of water to ensure complete swallowing of the pellets. Try to take your medicine at about the same time each day. Do not take your medicine more often than directed. Do not stop taking this medicine suddenly except upon the advice of your doctor. Stopping this medicine too quickly may cause serious side effects or your condition may worsen.  A special MedGuide will be given to you by the pharmacist with each prescription and refill. Be sure to read this information carefully each time.  Talk to your pediatrician regarding the use of this medicine in children. Special care may be needed.  Overdosage: If you think you have taken  too much of this medicine contact a poison control center or emergency room at once.  NOTE: This medicine is only for you. Do not share this medicine with others.  What if I miss a dose?  If you miss a dose, take it as soon as you can. If it is almost time for your next dose, take only that dose. Do not take double or extra doses.  What may interact with this medicine?  Do not take this medicine with any of the following medications:  -certain medicines for fungal infections like fluconazole, itraconazole, ketoconazole, posaconazole, voriconazole  -cisapride  -desvenlafaxine  -dofetilide  -dronedarone  -duloxetine  -levomilnacipran  -linezolid  -MAOIs like Carbex, Eldepryl, Marplan, Nardil, and Parnate  -methylene blue (injected into a vein)  -milnacipran  -pimozide  -thioridazine  -ziprasidone  This medicine may also interact with the following medications:  -aspirin and aspirin-like medicines  -certain medicines for depression, anxiety, or psychotic disturbances  -certain medicines for migraine headaches like almotriptan, eletriptan, frovatriptan, naratriptan, rizatriptan, sumatriptan, zolmitriptan  -certain medicines for sleep  -certain medicines that treat or prevent blood clots like dalteparin, enoxaparin, warfarin  -cimetidine  -clozapine  -diuretics  -fentanyl  -furazolidone  -indinavir  -isoniazid  -lithium  -metoprolol  -NSAIDS, medicines for pain and inflammation, like ibuprofen or naproxen  -other medicines that prolong the QT interval (cause an abnormal heart rhythm)  -procarbazine  -rasagiline  -supplements like St. John's wort, kava kava, valerian  -tramadol  -tryptophan  This list may not describe all possible interactions.   Give your health care provider a list of all the medicines, herbs, non-prescription drugs, or dietary supplements you use. Also tell them if you smoke, drink alcohol, or use illegal drugs. Some items may interact with your medicine.  What should I watch for while using this  medicine?  Tell your doctor if your symptoms do not get better or if they get worse. Visit your doctor or health care professional for regular checks on your progress. Because it may take several weeks to see the full effects of this medicine, it is important to continue your treatment as prescribed by your doctor.  Patients and their families should watch out for new or worsening thoughts of suicide or depression. Also watch out for sudden changes in feelings such as feeling anxious, agitated, panicky, irritable, hostile, aggressive, impulsive, severely restless, overly excited and hyperactive, or not being able to sleep. If this happens, especially at the beginning of treatment or after a change in dose, call your health care professional.  This medicine can cause an increase in blood pressure. Check with your doctor for instructions on monitoring your blood pressure while taking this medicine.  You may get drowsy or dizzy. Do not drive, use machinery, or do anything that needs mental alertness until you know how this medicine affects you. Do not stand or sit up quickly, especially if you are an older patient. This reduces the risk of dizzy or fainting spells. Alcohol may interfere with the effect of this medicine. Avoid alcoholic drinks.  Your mouth may get dry. Chewing sugarless gum, sucking hard candy and drinking plenty of water will help. Contact your doctor if the problem does not go away or is severe.  What side effects may I notice from receiving this medicine?  Side effects that you should report to your doctor or health care professional as soon as possible:  -allergic reactions like skin rash, itching or hives, swelling of the face, lips, or tongue  -breathing problems  -changes in vision  -hallucination, loss of contact with reality  -seizures  -suicidal thoughts or other mood changes  -trouble passing urine or change in the amount of urine  -unusual bleeding or bruising  Side effects that usually do  not require medical attention (report to your doctor or health care professional if they continue or are bothersome):  -change in sex drive or performance  -constipation  -increased sweating  -loss of appetite  -nausea  -tremors  -weight loss  This list may not describe all possible side effects. Call your doctor for medical advice about side effects. You may report side effects to FDA at 1-800-FDA-1088.  Where should I keep my medicine?  Keep out of the reach of children.  Store at a controlled temperature between 20 and 25 degrees C (68 degrees and 77 degrees F), in a dry place. Throw away any unused medicine after the expiration date.  NOTE: This sheet is a summary. It may not cover all possible information. If you have questions about this medicine, talk to your doctor, pharmacist, or health care provider.     © 2016, [employer]. (2013-02-27 12:46:03)

## 2016-04-12 NOTE — Assessment & Plan Note (Signed)
Patient enrolled in manufacturer medication assistance program that will not ship directly to a patient's home. We had [facility]. Patient is here to pick up medication. Here is what we provided to the patient:  Effexor XR 75mg  Capsules Quantity: #90 capsules Manufacturer: [place] Lot: [id number] Exp: 02/12/2018  Directions: Take 1 capsule by mouth daily.   Patient was provided with medication leaflet regarding adverse effects, how to take the medication, and drug-interactions, as well as counseled today regarding these things. Patient was instructed to call the clinic with any further questions or concerns.   Belia Heman, PharmD PGY1 Resident 04/12/2016 8:50 AM

## 2016-04-21 ENCOUNTER — Ambulatory Visit (INDEPENDENT_AMBULATORY_CARE_PROVIDER_SITE_OTHER): Payer: Self-pay | Admitting: Internal Medicine

## 2016-04-21 ENCOUNTER — Encounter: Payer: Self-pay | Admitting: Internal Medicine

## 2016-04-21 ENCOUNTER — Telehealth: Payer: Self-pay | Admitting: Licensed Clinical Social Worker

## 2016-04-21 VITALS — BP 151/99 | HR 102 | Temp 98.7°F | Wt 379.7 lb

## 2016-04-21 DIAGNOSIS — Z86711 Personal history of pulmonary embolism: Secondary | ICD-10-CM

## 2016-04-21 DIAGNOSIS — I5022 Chronic systolic (congestive) heart failure: Secondary | ICD-10-CM

## 2016-04-21 DIAGNOSIS — J45909 Unspecified asthma, uncomplicated: Secondary | ICD-10-CM

## 2016-04-21 DIAGNOSIS — J4540 Moderate persistent asthma, uncomplicated: Secondary | ICD-10-CM

## 2016-04-21 DIAGNOSIS — F32A Depression, unspecified: Secondary | ICD-10-CM

## 2016-04-21 DIAGNOSIS — Z7901 Long term (current) use of anticoagulants: Secondary | ICD-10-CM

## 2016-04-21 DIAGNOSIS — Z79899 Other long term (current) drug therapy: Secondary | ICD-10-CM

## 2016-04-21 DIAGNOSIS — I10 Essential (primary) hypertension: Secondary | ICD-10-CM

## 2016-04-21 DIAGNOSIS — F329 Major depressive disorder, single episode, unspecified: Secondary | ICD-10-CM

## 2016-04-21 DIAGNOSIS — F1420 Cocaine dependence, uncomplicated: Secondary | ICD-10-CM

## 2016-04-21 DIAGNOSIS — I110 Hypertensive heart disease with heart failure: Secondary | ICD-10-CM

## 2016-04-21 DIAGNOSIS — Z23 Encounter for immunization: Secondary | ICD-10-CM

## 2016-04-21 DIAGNOSIS — F17210 Nicotine dependence, cigarettes, uncomplicated: Secondary | ICD-10-CM

## 2016-04-21 DIAGNOSIS — H538 Other visual disturbances: Secondary | ICD-10-CM | POA: Insufficient documentation

## 2016-04-21 MED ORDER — HYDRALAZINE HCL 25 MG PO TABS: 25.0000 mg | ORAL_TABLET | Freq: Three times a day (TID) | ORAL | 3 refills | Status: DC

## 2016-04-21 NOTE — Assessment & Plan Note (Signed)
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 Patient is clinically euvolemic and denies any DOE, chest pain, shortness of breath. He has been compliant with his medications- Lasix 20 mg daily, lisinopril 40 mg daily, hydralazine 10 mg TID, Imdur 30 mg daily. He is not on a BB due to ongoing cocaine use. He has not followed up with Dr. Martinique with Cardiology.   Plan: -Continue current medications, increase Hydralazine to 25 mg TID due to HTN -Follow up with Cardiology

## 2016-04-21 NOTE — Telephone Encounter (Signed)
Hi Shana,  I would agree to signing another letter for 6 months of food stamps for Mr. Edholm. Are you able to write it up for me?  Thank you, Alexa Quay Burow

## 2016-04-21 NOTE — Assessment & Plan Note (Signed)
Patient reports good control of his depression. He feels happy and looks forward to his day. He is excited about hunting season starting. He reports compliance with Effexor 75 mg daily.   Plan: -Continue Effexor 75 mg daily

## 2016-04-21 NOTE — Progress Notes (Signed)
    CC: follow up for HTN, HFrEF  HPI: Donald Brady is a 40 y.o. 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 Please see problem based assessment and plan for more information.   Patient is eating and drinking well. He denies fever or chest pain.    Past Medical History:  Diagnosis Date  . Anxiety   . Arthritis    left knee  . Asthma    no inhaler use in 1 year  . Factor V Leiden ([place])   . GERD (gastroesophageal reflux disease)   . History of pulmonary embolus (PE)    takes Xarelto  . History of seizures    no known cause, per pt. - has been "years" since last seizure; no longer on anticonvulsant  . HLD (hyperlipidemia)   . Hypertension    states BP "runs high"; has been on med. "a while"  . Lateral meniscus tear 01/2015   left knee  . Shortness of breath dyspnea   . Sleep apnea    had sleep study 10/2013:  "severe" sleep apnea, states could not afford CPAP machine    Review of Systems: Please see pertinent ROS reviewed in HPI and problem based charting.   Physical Exam: Vitals:   04/21/16 1325 04/21/16 1348  BP: (!) 156/92 (!) 151/99  Pulse: (!) 112 (!) 102  Temp: 98.7 F (37.1 C)   TempSrc: Oral   SpO2: 97%   Weight: (!) 379 lb 11.2 oz (172.2 kg)    General: Vital signs reviewed.  Patient is obese, in no acute distress and cooperative with exam.  Cardiovascular: Tachycardic, regular rhythm, S1 normal, S2 normal, no murmurs, gallops, or rubs. Pulmonary/Chest: Clear to auscultation bilaterally, no wheezes, rales, or rhonchi. Abdominal: Soft, non-tender, non-distended, BS + Extremities: Trace non-pitting lower extremity edema bilaterally Skin: Warm, dry and intact.  Psychiatric: Normal mood and affect. speech and behavior is normal. Cognition and memory are normal.   Assessment & Plan:  See encounters tab for problem based medical decision making. Patient discussed with  Dr. Eppie Gibson

## 2016-04-21 NOTE — Assessment & Plan Note (Signed)
BP Readings from Last 3 Encounters:  04/21/16 (!) 151/99  03/03/16 (!) 129/99  02/28/16 (!) 139/92    Lab Results  Component Value Date   NA 142 02/28/2016   K 3.9 02/28/2016   CREATININE 1.76 (H) 02/28/2016    Assessment: Blood pressure control:  Uncontrolled Progress toward BP goal:   Above goal  Comments: He has been compliant with his medications- Lasix 20 mg daily, lisinopril 40 mg daily, hydralazine  10 mg TID, Imdur 30 mg daily. He is not on a BB due to ongoing cocaine use.   Plan: Medications:  continue current medications and increase Hydralazine to 25 mg TID

## 2016-04-21 NOTE — Telephone Encounter (Signed)
CSW received incoming call from [name] mother, Donald Brady.  Mother requesting updated letter to provide to DSS to continue pt's food stamp benefits.  Donald Brady states DSS based time frame from previous [facility] letter and will need to provide documentation by 05/20/16.  Mother states she is starting earlier to not delay benefits.  Donald Brady informed request will be forwarded to PCP. In addition, Donald Brady states son has an appointment today and inquired if today's provider could assess the coloration of his face.  Mother voiced concern that pt's facial skin is becoming darker. Mother informed this concern will be forwarded to PCP.

## 2016-04-21 NOTE — Assessment & Plan Note (Signed)
Symptoms are controlled with albuterol prn and Dulera 2 puffs BID.  Plan: -Continue current regimen

## 2016-04-21 NOTE — Patient Instructions (Addendum)
Mr. Emrich,   Dennis Bast are doing a great job taking your medications. Please continue doing so! Continue to work on weight loss and cutting back on your alcohol use (but remember not to quit cold-turkey).   Continue your current medications, I will send in an a higher dose of your hydralazine (25 mg three times a day).   Weigh yourself each day. If you gain more than 3-5 pounds in one day, please call our clinic. Also call us if you develop shortness of breath. Please remember to follow up with Dr. Martinique with Cardiology.   We have also referred you to Ophthalmology.   Enjoy hunting season!

## 2016-04-21 NOTE — Assessment & Plan Note (Signed)
Patient reports compliance with Xarelto 20 mg daily. He denies chest pain, shortness of breath, or lower extremity pain and swelling.   Plan: -Continue Xarelto 20 mg daily.

## 2016-04-22 ENCOUNTER — Telehealth: Payer: Self-pay

## 2016-04-22 ENCOUNTER — Encounter: Payer: Self-pay | Admitting: Licensed Clinical Social Worker

## 2016-04-22 NOTE — Telephone Encounter (Signed)
Returned Med Assist cal - Talked to [place], wanted to clarify Hydralazine order. Informed pt was seen in the office and order was changed per Dr Quay Burow to 25 mg TID as written.

## 2016-04-22 NOTE — Telephone Encounter (Signed)
Yes, I will place in your mailbox for signature.

## 2016-04-22 NOTE — Progress Notes (Signed)
Case discussed with Dr. Burns soon after the resident saw the patient.  We reviewed the resident's history and exam and pertinent patient test results.  I agree with the assessment, diagnosis and plan of care documented in the resident's note. 

## 2016-04-22 NOTE — Telephone Encounter (Signed)
Luke requesting the nurse to call back regarding hydralazine.

## 2016-04-26 NOTE — Telephone Encounter (Signed)
Attempted to contact [name] and notify patient, letter request is being mailed today.  Unable to reach patient.  Letter mailed.

## 2016-05-24 ENCOUNTER — Encounter: Payer: Self-pay | Admitting: Sports Medicine

## 2016-05-24 ENCOUNTER — Ambulatory Visit (INDEPENDENT_AMBULATORY_CARE_PROVIDER_SITE_OTHER): Payer: Self-pay | Admitting: Sports Medicine

## 2016-05-24 DIAGNOSIS — M25561 Pain in right knee: Secondary | ICD-10-CM

## 2016-05-24 DIAGNOSIS — M25562 Pain in left knee: Secondary | ICD-10-CM

## 2016-05-24 DIAGNOSIS — G8929 Other chronic pain: Secondary | ICD-10-CM

## 2016-05-24 MED ORDER — METHYLPREDNISOLONE ACETATE 40 MG/ML IJ SUSP
40.0000 mg | Freq: Once | INTRAMUSCULAR | Status: AC
  Administered 2016-05-24: 40 mg via INTRA_ARTICULAR

## 2016-05-24 NOTE — Progress Notes (Signed)
   Subjective:    Patient ID: Donald Brady, male    DOB: 1975/11/03, 40 y.o.   MRN: [id number]  HPI chief complaint: Bilateral knee pain  Donald Brady returns to the office today requesting bilateral cortisone injections into his knees. He has a well documented history of bilateral knee DJD. He is also status post left knee arthroscopy done by Dr.Xu last year. He does think the arthroscopy was helpful. He was doing well up until a few weeks ago. He is an avid Retail banker and has noticed increasing pain when he has to climb up and down the deer stands. He also has pain when sitting for a long period of time in one position. He denies recent trauma. He does get intermittent swelling.  Interim medical history reviewed Current medications reviewed Allergies reviewed    Review of Systems    as above Objective:   Physical Exam  Well-developed, obese. No acute distress. Vital signs reviewed  Examination of each knee shows range of motion from 0-120. 1+ boggy synovitis. He is tender to palpation along the medial joint lines bilaterally. Negative McMurray's. Knees are grossly stable to ligamentous exam. Neurovascularly intact distally. Walking with a limp.      Assessment & Plan:   Returning bilateral knee pain secondary to DJD Congestive heart failure Chronic anticoagulation-on Xarelto Renal insufficiency  Each of the patient's knees are injected today with cortisone. An anterior lateral approach is utilized. He tolerated this without difficulty. This was done after risks and benefits of the injections were explained including the risk of hemarthrosis given his anticoagulation. He is not a good surgical candidate for total knee arthroplasty. He understands the importance of weight loss on the overall health of his knees. Follow-up with me as needed.  Consent obtained and verified. Time-out conducted. Noted no overlying erythema, induration, or other signs of local infection. Skin prepped in a  sterile fashion. Topical analgesic spray: Ethyl chloride. Joint: right knee Needle: 22g 1.5 inch Completed without difficulty. Meds: 3cc 1% xylocaine, 1cc (40mg ) depomedrol  Advised to call if fevers/chills, erythema, induration, drainage, or persistent bleeding.   Consent obtained and verified. Time-out conducted. Noted no overlying erythema, induration, or other signs of local infection. Skin prepped in a sterile fashion. Topical analgesic spray: Ethyl chloride. Joint: left knee Needle: 22g 1.5 inch Completed without difficulty. Meds: 3cc 1% xylocaine, 1cc (40mg ) depomedrol  Advised to call if fevers/chills, erythema, induration, drainage, or persistent bleeding.

## 2016-05-27 ENCOUNTER — Telehealth: Payer: Self-pay

## 2016-05-27 NOTE — Telephone Encounter (Signed)
Donald Brady is a 40 y.o. male who was contacted via telephone for monitoring of rivaroxaban (Xarelto) therapy.    ASSESSMENT Indication(s): PE and Factor 5 Leiden mutation Duration: indefinite  Labs:    Component Value Date/Time   AST 24 08/19/2015 0623   ALT 31 08/19/2015 0623   NA 142 02/28/2016 0427   NA 141 08/29/2015 1544   K 3.9 02/28/2016 0427   CL 110 02/28/2016 0427   CO2 25 02/28/2016 0427   GLUCOSE 114 (H) 02/28/2016 0427   HGBA1C 6.0 (H) 08/19/2015 1525   BUN 10 02/28/2016 0427   BUN 18 08/29/2015 1544   CREATININE 1.76 (H) 02/28/2016 0427   CREATININE 1.49 (H) 11/25/2014 1103   CALCIUM 8.6 (L) 02/28/2016 0427   GFRNONAA 47 (L) 02/28/2016 0427   GFRNONAA 59 (L) 11/25/2014 1103   GFRAA 55 (L) 02/28/2016 0427   GFRAA 68 11/25/2014 1103   WBC 9.4 02/27/2016 2056   HGB 16.5 02/27/2016 2056   HCT 49.4 02/27/2016 2056   PLT 238 02/27/2016 2056    rivaroxaban (Xarelto) Dose: 20mg   Safety: Patient has not had recent bleeding/thromboembolic events. Patient reports no recent signs or symptoms of bleeding, no signs of symptoms of thromboembolism. Medication changes: no.  Renal/hepatic/drug interaction concerns: no.  Adherence: Patient reports no known adherence challenges. Patient does correctly recite the dose. Contacted pharmacy and records indicate refills are consistent. Refill dates 03/2016 (90 day supply).  Patient Instructions: Patient advised to contact clinic or seek medical attention if signs/symptoms of bleeding or thromboembolism occur. Patient verbalized understanding by repeating back information.  Follow-up Recommended labs to consider: CBC . Next appointment: 10.19/17 at 10:00am  Darcella Cheshire PharmD Candidate  05/27/2016, 2:26 PM

## 2016-05-31 ENCOUNTER — Emergency Department (HOSPITAL_COMMUNITY): Payer: Self-pay

## 2016-05-31 ENCOUNTER — Emergency Department (HOSPITAL_COMMUNITY)
Admission: EM | Admit: 2016-05-31 | Discharge: 2016-05-31 | Disposition: A | Discharge status: Home / Self Care | Payer: Self-pay | Attending: Emergency Medicine | Admitting: Emergency Medicine

## 2016-05-31 DIAGNOSIS — J45909 Unspecified asthma, uncomplicated: Secondary | ICD-10-CM | POA: Insufficient documentation

## 2016-05-31 DIAGNOSIS — F17210 Nicotine dependence, cigarettes, uncomplicated: Secondary | ICD-10-CM | POA: Insufficient documentation

## 2016-05-31 DIAGNOSIS — K047 Periapical abscess without sinus: Secondary | ICD-10-CM | POA: Insufficient documentation

## 2016-05-31 DIAGNOSIS — R0602 Shortness of breath: Secondary | ICD-10-CM

## 2016-05-31 DIAGNOSIS — I5022 Chronic systolic (congestive) heart failure: Secondary | ICD-10-CM | POA: Insufficient documentation

## 2016-05-31 DIAGNOSIS — Z7901 Long term (current) use of anticoagulants: Secondary | ICD-10-CM | POA: Insufficient documentation

## 2016-05-31 DIAGNOSIS — I130 Hypertensive heart and chronic kidney disease with heart failure and stage 1 through stage 4 chronic kidney disease, or unspecified chronic kidney disease: Secondary | ICD-10-CM | POA: Insufficient documentation

## 2016-05-31 DIAGNOSIS — N183 Chronic kidney disease, stage 3 (moderate): Secondary | ICD-10-CM | POA: Insufficient documentation

## 2016-05-31 DIAGNOSIS — Z79899 Other long term (current) drug therapy: Secondary | ICD-10-CM | POA: Insufficient documentation

## 2016-05-31 LAB — CBC WITH DIFFERENTIAL/PLATELET
Basophils Absolute: 0.1 10*3/uL (ref 0.0–0.1)
Basophils Relative: 1 %
Eosinophils Absolute: 0.2 10*3/uL (ref 0.0–0.7)
Eosinophils Relative: 2 %
HCT: 49.7 % (ref 39.0–52.0)
Hemoglobin: 16.9 g/dL (ref 13.0–17.0)
Lymphocytes Relative: 22 %
Lymphs Abs: 1.9 10*3/uL (ref 0.7–4.0)
MCH: 30.8 pg (ref 26.0–34.0)
MCHC: 34 g/dL (ref 30.0–36.0)
MCV: 90.7 fL (ref 78.0–100.0)
Monocytes Absolute: 0.9 10*3/uL (ref 0.1–1.0)
Monocytes Relative: 11 %
Neutro Abs: 5.5 10*3/uL (ref 1.7–7.7)
Neutrophils Relative %: 64 %
Platelets: 243 10*3/uL (ref 150–400)
RBC: 5.48 MIL/uL (ref 4.22–5.81)
RDW: 13.9 % (ref 11.5–15.5)
WBC: 8.5 10*3/uL (ref 4.0–10.5)

## 2016-05-31 LAB — BASIC METABOLIC PANEL
Anion gap: 10 (ref 5–15)
BUN: 10 mg/dL (ref 6–20)
CO2: 23 mmol/L (ref 22–32)
Calcium: 8.7 mg/dL — ABNORMAL LOW (ref 8.9–10.3)
Chloride: 108 mmol/L (ref 101–111)
Creatinine, Ser: 1.56 mg/dL — ABNORMAL HIGH (ref 0.61–1.24)
GFR calc Af Amer: >60 mL/min (ref >60)
GFR calc non Af Amer: 54 mL/min — ABNORMAL LOW (ref >60)
Glucose, Bld: 114 mg/dL — ABNORMAL HIGH (ref 65–99)
Potassium: 4.1 mmol/L (ref 3.5–5.1)
Sodium: 141 mmol/L (ref 135–145)

## 2016-05-31 LAB — I-STAT CG4 LACTIC ACID, ED: Lactic Acid, Venous: 1.07 mmol/L (ref 0.5–1.9)

## 2016-05-31 MED ORDER — HYDROCODONE-ACETAMINOPHEN 5-325 MG PO TABS: 1.0000 | ORAL_TABLET | Freq: Four times a day (QID) | ORAL | 0 refills | Status: DC | PRN

## 2016-05-31 MED ORDER — SODIUM CHLORIDE 0.9 % IV BOLUS (SEPSIS)
500.0000 mL | Freq: Once | INTRAVENOUS | Status: AC
Start: 2016-05-31 — End: 2016-05-31
  Administered 2016-05-31: 500 mL via INTRAVENOUS

## 2016-05-31 MED ORDER — PENICILLIN V POTASSIUM 500 MG PO TABS
500.0000 mg | ORAL_TABLET | Freq: Four times a day (QID) | ORAL | 0 refills | Status: DC
Start: 2016-05-31 — End: 2016-06-01

## 2016-05-31 MED ORDER — BUPIVACAINE HCL (PF) 0.5 % IJ SOLN
1.8000 mL | Freq: Once | INTRAMUSCULAR | Status: AC
  Administered 2016-05-31: 1.8 mL
  Filled 2016-05-31: qty 10

## 2016-05-31 MED ORDER — FUROSEMIDE 10 MG/ML IJ SOLN
20.0000 mg | Freq: Once | INTRAMUSCULAR | Status: AC
  Administered 2016-05-31: 20 mg via INTRAVENOUS
  Filled 2016-05-31: qty 2

## 2016-05-31 MED ORDER — HYDROCODONE-ACETAMINOPHEN 5-325 MG PO TABS
2.0000 | ORAL_TABLET | Freq: Once | ORAL | Status: AC
  Administered 2016-05-31: 2 via ORAL
  Filled 2016-05-31: qty 2

## 2016-05-31 NOTE — ED Provider Notes (Signed)
[facility] DEPT Provider Note   CSN: [id number] Arrival date & time: 05/31/16  0556     History   Chief Complaint Chief Complaint  Patient presents with  . Shortness of Breath  . Dental Pain    HPI Donald Brady is a 40 y.o. male with history of asthma, PE treated with Xarelto who presents with dental pain that began 2 days ago and sudden onset shortness of breath that began this morning and has now resolved. Patient stated his shortness of breath began after her shower this morning and was present as he was sitting in his chair watching television. EMS gave the patient high flow oxygen and his saturations improved drastically. Patient states he is breathing at baseline at this time. He has had some increasing peripheral edema over the past few days. Patient believes he has a dental abscess. Patient has had chronic dental problems and does not see a dentist. Patient has been taking Tylenol for his pain without much relief. Patient denies any fevers, chest pain, pleuritic symptoms, abdominal pain, nausea, vomiting, urinary symptoms.  HPI  Past Medical History:  Diagnosis Date  . Anxiety   . Arthritis    left knee  . Asthma    no inhaler use in 1 year  . Factor V Leiden ([place])   . GERD (gastroesophageal reflux disease)   . History of pulmonary embolus (PE)    takes Xarelto  . History of seizures    no known cause, per pt. - has been "years" since last seizure; no longer on anticonvulsant  . HLD (hyperlipidemia)   . Hypertension    states BP "runs high"; has been on med. "a while"  . Lateral meniscus tear 01/2015   left knee  . Shortness of breath dyspnea   . Sleep apnea    had sleep study 10/2013:  "severe" sleep apnea, states could not afford CPAP machine    Patient Active Problem List   Diagnosis Date Noted  . Morbid obesity due to excess calories ([place])   . Presbyopia 01/26/2016  . Alcohol abuse 01/26/2016  . Hypertensive heart disease 10/01/2015  . Chronic systolic  CHF (congestive heart failure) ([place]) 10/01/2015  . Umbilical hernia without obstruction and without gangrene 08/29/2015  . Pre-diabetes 08/29/2015  . CKD (chronic kidney disease), stage III 08/19/2015  . 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 Cocaine use 12/23/2014  . Depression 10/10/2014  . GERD (gastroesophageal reflux disease) 07/24/2014  . Polycythemia, secondary 05/23/2014  . Factor 5 Leiden mutation, heterozygous ([place]) 05/22/2014  . Hyperlipidemia 04/23/2014  . Sinusitis, chronic 04/23/2014  . Severe obstructive sleep apnea 11/20/2013  . Essential hypertension, benign 08/04/2013  . Asthma 02/20/2013  . Osteoarthritis of left knee 06/28/2012  . Long term current use of anticoagulant therapy 12/02/2011  . Bilateral pulmonary embolism ([place]) 11/27/2011  . Tobacco abuse 11/27/2011    Past Surgical History:  Procedure Laterality Date  . CARDIAC CATHETERIZATION N/A 02/19/2015   Procedure: Left Heart Cath and Coronary Angiography;  Surgeon: Peter M Martinique, MD;  Location: [facility];  Service: Cardiovascular;  Laterality: N/A;  . CHONDROPLASTY Left 03/26/2015   Procedure: CHONDROPLASTY;  Surgeon: Leandrew Koyanagi, MD;  Location: [facility];  Service: Orthopedics;  Laterality: Left;  . ESOPHAGOGASTRODUODENOSCOPY (EGD) WITH PROPOFOL  07/10/2014  . HAND SURGERY Right    states knuckle removed  . KNEE ARTHROSCOPY WITH LATERAL MENISECTOMY Left 03/26/2015   Procedure: LEFT KNEE ARTHROSCOPY WITH LATERAL MENISECTOMY  CHONDROPLASTY;  Surgeon:  Leandrew Koyanagi, MD;  Location: [facility];  Service: Orthopedics;  Laterality: Left;  . ORIF FEMUR FRACTURE Left   . VIDEO BRONCHOSCOPY Bilateral 12/06/2012   Procedure: VIDEO BRONCHOSCOPY WITHOUT FLUORO;  Surgeon: Juanito Doom, MD;  Location: [facility];  Service: Cardiopulmonary;  Laterality: Bilateral;       Home Medications    Prior to Admission medications   Medication Sig Start Date End Date  Taking? Authorizing Provider  albuterol (PROVENTIL HFA;VENTOLIN HFA) 108 (90 Base) MCG/ACT inhaler Inhale 2 puffs into the lungs every 6 (six) hours as needed for wheezing or shortness of breath. 03/18/16   Florinda Marker, MD  furosemide (LASIX) 20 MG tablet Take 1 tablet (20 mg total) by mouth daily. 03/18/16 03/18/17  Florinda Marker, MD  hydrALAZINE (APRESOLINE) 25 MG tablet Take 1 tablet (25 mg total) by mouth 3 (three) times daily. 04/21/16   Florinda Marker, MD  HYDROcodone-acetaminophen (NORCO/VICODIN) 5-325 MG tablet Take 1-2 tablets by mouth every 6 (six) hours as needed for severe pain. 05/31/16   Frederica Kuster, PA-C  isosorbide mononitrate (IMDUR) 30 MG 24 hr tablet Take 1 tablet (30 mg total) by mouth daily. 03/18/16   Florinda Marker, MD  lisinopril (PRINIVIL,ZESTRIL) 40 MG tablet Take 1 tablet (40 mg total) by mouth daily. 03/18/16   Alexa Angela Burke, MD  mometasone-formoterol (DULERA) 200-5 MCG/ACT AERO Inhale 2 puffs into the lungs 2 (two) times daily. 03/18/16   Florinda Marker, MD  penicillin v potassium (VEETID) 500 MG tablet Take 1 tablet (500 mg total) by mouth 4 (four) times daily. 05/31/16 06/07/16  Frederica Kuster, PA-C  rivaroxaban (XARELTO) 20 MG TABS tablet Take 1 tablet (20 mg total) by mouth daily with supper. 03/18/16   Florinda Marker, MD  venlafaxine (EFFEXOR) 75 MG tablet Take 75 mg by mouth daily.    Historical Provider, MD    Family History Family History  Problem Relation Age of Onset  . Diabetes Mother   . Hypertension Mother   . Hearing loss Father     died in his 74's  . Cancer Father     unknown type  . Clotting disorder Father   . Heart disease Father   . Diabetes Maternal Aunt   . Hypertension Maternal Aunt   . Diabetes Maternal Uncle   . Hypertension Maternal Uncle   . Stomach cancer Maternal Uncle   . Lung cancer Paternal Barbaraann Rondo     was a smoker  . Breast cancer Maternal Aunt   . Stomach cancer Maternal Uncle   . Liver disease Maternal Uncle     drinker  . Kidney  disease Maternal Aunt     Social History Social History  Substance Use Topics  . Smoking status: Current Every Day Smoker    Packs/day: 1.00    Years: 21.00    Types: Cigarettes  . Smokeless tobacco: Never Used     Comment: Stress.  . Alcohol use 0.0 oz/week     Comment: weekends     Allergies   Bee venom; Honey; Shellfish allergy; Ibuprofen; and Betadine [povidone iodine]   Review of Systems Review of Systems  Constitutional: Negative for chills and fever.  HENT: Positive for dental problem. Negative for facial swelling and sore throat.   Respiratory: Positive for shortness of breath (resolved).   Cardiovascular: Negative for chest pain.  Gastrointestinal: Negative for abdominal pain, nausea and vomiting.  Genitourinary: Negative for dysuria.  Musculoskeletal: Negative for  back pain.  Skin: Negative for rash and wound.  Neurological: Negative for headaches.  Psychiatric/Behavioral: The patient is not nervous/anxious.      Physical Exam Updated Vital Signs BP 130/66   Pulse 93   Temp 98.4 F (36.9 C) (Oral)   Resp 18   SpO2 95%   Physical Exam  Constitutional: He appears well-developed and well-nourished. No distress.  HENT:  Head: Normocephalic and atraumatic.  Mouth/Throat: Oropharynx is clear and moist. Abnormal dentition. Dental abscesses present. No oropharyngeal exudate.    Eyes: Conjunctivae are normal. Pupils are equal, round, and reactive to light. Right eye exhibits no discharge. Left eye exhibits no discharge. No scleral icterus.  Neck: Normal range of motion. Neck supple. No thyromegaly present.  Cardiovascular: Regular rhythm, normal heart sounds and intact distal pulses.  Tachycardia present.  Exam reveals no gallop and no friction rub.   No murmur heard. Pulmonary/Chest: Effort normal and breath sounds normal. No stridor. No respiratory distress. He has no wheezes. He has no rales.  Abdominal: Soft. Bowel sounds are normal. He exhibits no  distension. There is no tenderness. There is no rebound and no guarding.  Musculoskeletal: He exhibits edema (1+ LE edema).  Lymphadenopathy:    He has no cervical adenopathy.  Neurological: He is alert. Coordination normal.  Skin: Skin is warm and dry. No rash noted. He is not diaphoretic. No pallor.  Psychiatric: He has a normal mood and affect.  Nursing note and vitals reviewed.    ED Treatments / Results  Labs (all labs ordered are listed, but only abnormal results are displayed) Labs Reviewed  BASIC METABOLIC PANEL - Abnormal; Notable for the following:       Result Value   Glucose, Bld 114 (*)    Creatinine, Ser 1.56 (*)    Calcium 8.7 (*)    GFR calc non Af Amer 54 (*)    All other components within normal limits  CBC WITH DIFFERENTIAL/PLATELET  I-STAT CG4 LACTIC ACID, ED    EKG  EKG Interpretation  Date/Time:  Monday May 31 2016 06:24:43 EDT Ventricular Rate:  108 PR Interval:    QRS Duration: 86 QT Interval:  371 QTC Calculation: 498 R Axis:   -2 Text Interpretation:  Sinus tachycardia Probable left atrial enlargement Borderline prolonged QT interval No significant change since last tracing Confirmed by FLOYD MD, DANIEL [phone]) on 05/31/2016 6:31:37 AM       Radiology Dg Chest 2 View  Result Date: 05/31/2016 CLINICAL DATA:  Shortness of breath. EXAM: CHEST  2 VIEW COMPARISON:  05/29/2016 . FINDINGS: Cardiomegaly with pulmonary vascular prominence and bilateral interstitial prominence noted consistent with congestive heart failure. Interim improvement from prior exam. No pleural effusion or pneumothorax. IMPRESSION: Cardiomegaly with pulmonary vascular prominence and bilateral interstitial prominence consistent with mild congestive heart failure. Interim improvement from prior exam . Electronically Signed   By: Tabiona   On: 05/31/2016 07:18    Procedures .Nerve Block Date/Time: 05/31/2016 8:22 AM Performed by: Frederica Kuster Authorized by:  Frederica Kuster   Consent:    Consent obtained:  Verbal   Consent given by:  Patient Indications:    Indications:  Pain relief Location:    Body area:  Head   Head nerve blocked: inferior alveolar.   Laterality:  Right Procedure details (see MAR for exact dosages):    Block needle gauge:  27 G   Anesthetic injected:  Bupivacaine 0.5% WITH epi   Steroid injected:  None  Additive injected:  None   Injection procedure:  Anatomic landmarks identified   Paresthesia:  None Post-procedure details:    Dressing:  None   Outcome:  Pain relieved   Patient tolerance of procedure:  Tolerated well, no immediate complications .Marland KitchenIncision and Drainage Date/Time: 05/31/2016 8:24 AM Performed by: Frederica Kuster Authorized by: Frederica Kuster   Consent:    Consent obtained:  Verbal   Consent given by:  Patient Location:    Type:  Abscess   Location:  Mouth   Mouth location:  Alveolar process Anesthesia (see MAR for exact dosages):    Anesthesia method:  Nerve block   Block needle gauge:  27 G   Block anesthetic:  Bupivacaine 0.5% WITH epi   Block technique:  Inferior alveolar   Block injection procedure:  Anatomic landmarks identified   Block outcome:  Anesthesia achieved Procedure type:    Complexity:  Simple Procedure details:    Needle aspiration: yes     Needle size:  18 G   Incision types:  Stab incision   Drainage:  Bloody and purulent   Drainage amount:  Scant   Wound treatment:  Wound left open   Packing materials:  None Post-procedure details:    Patient tolerance of procedure:  Tolerated well, no immediate complications   (including critical care time)  Medications Ordered in ED Medications  HYDROcodone-acetaminophen (NORCO/VICODIN) 5-325 MG per tablet 2 tablet (2 tablets Oral Given 05/31/16 0734)  sodium chloride 0.9 % bolus 500 mL (0 mLs Intravenous Hold 05/31/16 0735)  bupivacaine (MARCAINE) 0.5 % injection 1.8 mL (1.8 mLs Infiltration Given 05/31/16 0734)    furosemide (LASIX) injection 20 mg (20 mg Intravenous Given 05/31/16 0800)     Initial Impression / Assessment and Plan / ED Course  I have reviewed the triage vital signs and the nursing notes.  Pertinent labs & imaging results that were available during my care of the patient were reviewed by me and considered in my medical decision making (see chart for details).  Clinical Course    Patient with dental abscess and shortness of breath that was resolved upon arrival. CBC unremarkable. BMP shows stable creatinine, 1.56, calcium 8.7. Lactate 1.07. CXR shows cardiomegaly with pulmonary vascular prominence and bilateral interstitial prominence consistent with a chest; interim improvement from prior exam. EKG shows Sinus tachycardia, Probable left atrial enlargement, Borderline prolonged QT interval, but no significant change since last tracing. Patient with oxygen saturations above 95% on room air throughout ED course and continues to feel at baseline. Patient with mild tachycardia at baseline according to patient. Suspect mild CHF exacerbation as cause of shortness of breath. Patient given 40 IV Lasix in the ED. Dental abscess drained with 18-gauge with pus/bloody drainage following inferior alveolar nerve block. Patient immediately improved. Patient to follow up with PCP for further evaluation. He plans to go to his office medially after leaving here today. Patient also follow up with dentist as soon as possible. Patient discharged with penicillin and short course Norco. Patient advised not to drive or operate machinery when taking this medication and swelling take as prescribed. Patient understands and agrees with plan. Patient vitals stable at discharge. Patient also evaluated by Dr. Tyrone Nine who got the patient's management and agrees with plan.  Final Clinical Impressions(s) / ED Diagnoses   Final diagnoses:  Dental abscess  Shortness of breath    New Prescriptions New Prescriptions    HYDROCODONE-ACETAMINOPHEN (NORCO/VICODIN) 5-325 MG TABLET    Take 1-2 tablets by  mouth every 6 (six) hours as needed for severe pain.   PENICILLIN V POTASSIUM (VEETID) 500 MG TABLET    Take 1 tablet (500 mg total) by mouth 4 (four) times daily.     Frederica Kuster, PA-C 05/31/16 [place], DO 05/31/16 2314

## 2016-05-31 NOTE — ED Triage Notes (Signed)
324mg  Chewable ASA given PTA by EMS.

## 2016-05-31 NOTE — Discharge Instructions (Signed)
Medications: Penicillin, Norco  Treatment: Take medicines as prescribed for 1 week. Make sure to finish this medication. Take 1-2 Norco every 4-6 hours as needed for pain. Do not drive or drink machinery when taking this medication and will take as prescribed. Use warm saltwater rinses 3-4 times daily.  Follow-up: Please follow-up with your primary care provider today for follow-up of today's visit. Please see dentist soon as possible. See attached resources. Please return to emergency department if you develop any new or worsening symptoms.

## 2016-05-31 NOTE — ED Triage Notes (Signed)
Patient arrives from home via EMS. Complaint is sudden onset SOB this AM after taking shower. Additionally patient is complaining of right dental pain which is chronic for him. EMS was called and NRB @ 15L was applied for about 10 minutes with good effect. Patient then felt much better. Patient had used inhalers prior to ems arrival.

## 2016-06-01 ENCOUNTER — Encounter: Payer: Self-pay | Admitting: Internal Medicine

## 2016-06-01 ENCOUNTER — Ambulatory Visit (INDEPENDENT_AMBULATORY_CARE_PROVIDER_SITE_OTHER): Payer: Self-pay | Admitting: Internal Medicine

## 2016-06-01 VITALS — BP 154/114 | HR 112 | Temp 98.0°F | Ht 75.0 in | Wt 384.0 lb

## 2016-06-01 DIAGNOSIS — K0889 Other specified disorders of teeth and supporting structures: Secondary | ICD-10-CM

## 2016-06-01 DIAGNOSIS — K047 Periapical abscess without sinus: Secondary | ICD-10-CM | POA: Insufficient documentation

## 2016-06-01 DIAGNOSIS — F17210 Nicotine dependence, cigarettes, uncomplicated: Secondary | ICD-10-CM

## 2016-06-01 DIAGNOSIS — I5022 Chronic systolic (congestive) heart failure: Secondary | ICD-10-CM

## 2016-06-01 DIAGNOSIS — I110 Hypertensive heart disease with heart failure: Secondary | ICD-10-CM

## 2016-06-01 DIAGNOSIS — I10 Essential (primary) hypertension: Secondary | ICD-10-CM

## 2016-06-01 MED ORDER — AMOXICILLIN-POT CLAVULANATE 875-125 MG PO TABS: 1.0000 | ORAL_TABLET | Freq: Two times a day (BID) | ORAL | 0 refills | Status: DC

## 2016-06-01 MED FILL — AMOX-CLAV 875-125 MG TABLET: 875-125 | 7 days supply | Qty: 14 | Fill #0

## 2016-06-01 NOTE — Progress Notes (Signed)
CC: Dental pain  HPI:  [name] is a 40 y.o. male with PMH as listed below who presents for follow up management of a dental abscess and CHF.  Dental abscess: Patient with chronically poor dentition has avoided dental care in the past due to fear of dental procedures. He reports right lower severe dental pain beginning 3 days ago. Pain makes it difficult for him to eat. He went to the ED yesterday (05/31/16) and noted to have an abscess at the right lower 2nd molar. I&D was performed in the ED after a nerve block with drainage of blood and pus with great relief afterwards. Patient was started on oral Penicillin 500 mg four times day for 1 week and given Norco 5-325 mg 1-2 tabs q6h prn #10 for pain. He was also given contact information for local Dentists. Patient returns today for follow up and reports that his pain has returned with prior symptoms in the same location. He denies any fevers, chills, or headache.  CHF: Patient reports increased SOB with activity since his dental pain began. He reported increased swelling of his lower extremities as well. While in the ED for his dental abscess, he was given IV Lasix 40 mg once with some improvement in his edema and SOB. He currently takes Lasix 20 mg daily, but has not taken it today as he usually takes it in the evening. He has a scale at home but does not check his weight at home. He reports being able to ambulate without dyspnea on a normal day.  HTN: Patient with persistently elevated blood pressures. Currently takes Lisinopril 40 mg daily, Hydralazine 25 mg TID, Imdur 30 mg daily, and Lasix 20 mg daily which he reports adherence. BP on arrival today is 154/134. He says he took his morning meds prior to arrival. Repeat BP is 154/114. He denies any HA, chest pain, palpitations, or focal weakness.  Past Medical History:  Diagnosis Date  . Anxiety   . Arthritis    left knee  . Asthma    no inhaler use in 1 year  . Factor V Leiden  ([place])   . GERD (gastroesophageal reflux disease)   . History of pulmonary embolus (PE)    takes Xarelto  . History of seizures    no known cause, per pt. - has been "years" since last seizure; no longer on anticonvulsant  . HLD (hyperlipidemia)   . Hypertension    states BP "runs high"; has been on med. "a while"  . Lateral meniscus tear 01/2015   left knee  . Shortness of breath dyspnea   . Sleep apnea    had sleep study 10/2013:  "severe" sleep apnea, states could not afford CPAP machine    Review of Systems:   Review of Systems  Constitutional: Negative for chills and fever.  HENT:       Right lower dental pain and swelling  Respiratory: Positive for shortness of breath. Negative for cough and sputum production.   Cardiovascular: Positive for leg swelling. Negative for chest pain and palpitations.  Neurological: Negative for headaches.  Psychiatric/Behavioral: Negative for substance abuse.     Physical Exam:  Vitals:   06/01/16 0927  BP: (!) 154/134  Pulse: (!) 117  Temp: 98 F (36.7 C)  TempSrc: Oral  SpO2: 96%  Weight: (!) 384 lb (174.2 kg)  Height: 6\' 3"  (1.905 m)   Physical Exam  Constitutional: He is oriented to person, place, and time. He appears well-developed  and well-nourished.  Obese male, pleasant  HENT:  Mouth/Throat:    Multiple missing teeth, poor dentition. Swelling at right lateral lower 2nd molar, tender with light touch, no active drainage/discharge.  Cardiovascular:  Tachycardic, regular rhythm  Pulmonary/Chest: Effort normal. No respiratory distress. He has no wheezes. He has no rales.  Musculoskeletal:  Trace pitting edema bilateral lower extremities.  Neurological: He is alert and oriented to person, place, and time.  Skin: He is not diaphoretic.    Assessment & Plan:   See Encounters Tab for problem based charting.  Patient discussed with Dr. Angelia Mould

## 2016-06-01 NOTE — Assessment & Plan Note (Addendum)
Patient with chronically poor dentition has avoided dental care in the past due to fear of dental procedures. He reports right lower severe dental pain beginning 3 days ago. Pain makes it difficult for him to eat. He went to the ED yesterday (05/31/16) and noted to have an abscess at the right lower 2nd molar. I&D was performed in the ED after a nerve block with drainage of blood and pus with great relief afterwards. Patient was started on oral Penicillin 500 mg four times day for 1 week and given Norco 5-325 mg 1-2 tabs q6h prn #10 for pain. He was also given contact information for local Dentists. Patient returns today for follow up and reports that his pain has returned with prior symptoms in the same location. He denies any fevers, chills, or headache.  On exam, patient does have swelling at the lateral right lower 2nd molar which is exquisitely tender with light palpation. No active drainage seen. Will try to get patient in to dentist ASAP for further evaluation. He does take Xarelto for history of PE, but has not taken it the last 2 days as he usually takes it with meals. Would hold Xarelto 24h prior to surgical procedures with increased bleeding risk. -d/c Penicillin -Start Augmentin 875-125 mg BID for 7 days -Continue current Norco, patient reportedly has 6 tabs left -Dental visit as soon as possible

## 2016-06-01 NOTE — Assessment & Plan Note (Signed)
Patient reports increased SOB with activity since his dental pain began. He reported increased swelling of his lower extremities as well. While in the ED for his dental abscess, he was given IV Lasix 40 mg once with some improvement in his edema and SOB. He currently takes Lasix 20 mg daily, but has not taken it today as he usually takes it in the evening. He has a scale at home but does not check his weight at home. He reports being able to ambulate without dyspnea on a normal day. Also takes Imdur 30 mg qd, Lisinopril 40 mg qd, and Hydralazine 25 mg TID.  Patient does have trace pitting edema bilateral lower extremities, which he states he normally does not have. Lungs are clear on exam. Will advise patient to double current Lasix dose for 1 week and follow up with [place] at that time. -Increase Lasix to 40 mg daily for 1 week -Check daily weights at home -f/u 1 week or sooner if needed

## 2016-06-01 NOTE — Assessment & Plan Note (Signed)
Patient with persistently elevated blood pressures. Currently takes Lisinopril 40 mg daily, Hydralazine 25 mg TID, Imdur 30 mg daily, and Lasix 20 mg daily which he reports adherence. BP on arrival today is 154/134. He says he took his morning meds prior to arrival. Repeat BP is 154/114. He denies any HA, chest pain, palpitations, or focal weakness.  Patient's BP continue to be elevated although some of this is certainly in the setting of pain. He denies any recent cocaine use. Advised patient to continue current medications and follow up in 1 week.

## 2016-06-01 NOTE — Patient Instructions (Addendum)
It was a pleasure to meet you Mr. Schleeter.  For your dental abscess we will change the antibiotics as follows: Stop Penicillin Start Augmentin twice a day for 1 week  We will try to get you in to see a dentist as soon as possible. Please hold your Xarelto 24 hours prior to any surgical procedure.  For your fluid, please Increase your Lasix to 40 mg (two 20 mg tablets) once a day for 1 week and follow up with [place] in 1 week or sooner if needed.

## 2016-06-02 ENCOUNTER — Emergency Department (HOSPITAL_COMMUNITY)
Admission: EM | Admit: 2016-06-02 | Discharge: 2016-06-02 | Disposition: A | Discharge status: Home / Self Care | Payer: Self-pay | Attending: Emergency Medicine | Admitting: Emergency Medicine

## 2016-06-02 ENCOUNTER — Emergency Department (HOSPITAL_COMMUNITY): Payer: Self-pay

## 2016-06-02 DIAGNOSIS — Z7901 Long term (current) use of anticoagulants: Secondary | ICD-10-CM | POA: Insufficient documentation

## 2016-06-02 DIAGNOSIS — I5023 Acute on chronic systolic (congestive) heart failure: Secondary | ICD-10-CM | POA: Insufficient documentation

## 2016-06-02 DIAGNOSIS — I130 Hypertensive heart and chronic kidney disease with heart failure and stage 1 through stage 4 chronic kidney disease, or unspecified chronic kidney disease: Secondary | ICD-10-CM | POA: Insufficient documentation

## 2016-06-02 DIAGNOSIS — N183 Chronic kidney disease, stage 3 (moderate): Secondary | ICD-10-CM | POA: Insufficient documentation

## 2016-06-02 DIAGNOSIS — J45909 Unspecified asthma, uncomplicated: Secondary | ICD-10-CM | POA: Insufficient documentation

## 2016-06-02 DIAGNOSIS — I509 Heart failure, unspecified: Secondary | ICD-10-CM

## 2016-06-02 DIAGNOSIS — F17210 Nicotine dependence, cigarettes, uncomplicated: Secondary | ICD-10-CM | POA: Insufficient documentation

## 2016-06-02 LAB — BASIC METABOLIC PANEL
Anion gap: 9 (ref 5–15)
BUN: 11 mg/dL (ref 6–20)
CHLORIDE: 108 mmol/L (ref 101–111)
CO2: 19 mmol/L — ABNORMAL LOW (ref 22–32)
CREATININE: 1.59 mg/dL — AB (ref 0.61–1.24)
Calcium: 8.4 mg/dL — ABNORMAL LOW (ref 8.9–10.3)
GFR calc Af Amer: >60 mL/min (ref >60)
GFR, EST NON AFRICAN AMERICAN: 53 mL/min — AB (ref >60)
GLUCOSE: 117 mg/dL — AB (ref 65–99)
Potassium: 4 mmol/L (ref 3.5–5.1)
SODIUM: 136 mmol/L (ref 135–145)

## 2016-06-02 LAB — CBC WITH DIFFERENTIAL/PLATELET
BASOS PCT: 1 %
Basophils Absolute: 0.1 10*3/uL (ref 0.0–0.1)
EOS ABS: 0.2 10*3/uL (ref 0.0–0.7)
Eosinophils Relative: 2 %
HCT: 47.8 % (ref 39.0–52.0)
Hemoglobin: 16.2 g/dL (ref 13.0–17.0)
Lymphocytes Relative: 20 %
Lymphs Abs: 1.9 10*3/uL (ref 0.7–4.0)
MCH: 30.6 pg (ref 26.0–34.0)
MCHC: 33.9 g/dL (ref 30.0–36.0)
MCV: 90.2 fL (ref 78.0–100.0)
MONO ABS: 0.9 10*3/uL (ref 0.1–1.0)
MONOS PCT: 10 %
Neutro Abs: 6.6 10*3/uL (ref 1.7–7.7)
Neutrophils Relative %: 69 %
Platelets: 287 10*3/uL (ref 150–400)
RBC: 5.3 MIL/uL (ref 4.22–5.81)
RDW: 13.8 % (ref 11.5–15.5)
WBC: 9.7 10*3/uL (ref 4.0–10.5)

## 2016-06-02 LAB — PROTIME-INR
INR: 2
PROTHROMBIN TIME: 23 s — AB (ref 11.4–15.2)

## 2016-06-02 LAB — BRAIN NATRIURETIC PEPTIDE: B NATRIURETIC PEPTIDE 5: 498.9 pg/mL — AB (ref 0.0–100.0)

## 2016-06-02 MED ORDER — FUROSEMIDE 40 MG PO TABS: 40.0000 mg | ORAL_TABLET | Freq: Two times a day (BID) | ORAL | 0 refills | Status: DC

## 2016-06-02 MED ORDER — FUROSEMIDE 10 MG/ML IJ SOLN
80.0000 mg | Freq: Once | INTRAMUSCULAR | Status: AC
  Administered 2016-06-02: 80 mg via INTRAVENOUS
  Filled 2016-06-02: qty 8

## 2016-06-02 NOTE — ED Provider Notes (Addendum)
[facility] DEPT Provider Note   CSN: [id number] Arrival date & time: 06/02/16  [id number]  By signing my name below, I, Evelene Croon, attest that this documentation has been prepared under the direction and in the presence of Everlene Balls, MD . Electronically Signed: Evelene Croon, Scribe. 06/02/2016. 3:54 AM.   History   Chief Complaint Chief Complaint  Patient presents with  . Shortness of Breath    The history is provided by the patient. No language interpreter was used.    HPI Comments:  GARLAND LYKE is a 40 y.o. male with a history of CHF, PE, and HTN who presents to the Emergency Department complaining of worsening SOB which began ~1900 last night while watching TV. He reports associated blurry vision, pounding HA, and orthopnea. He ran out of his Lasix today. He denies CP. No alleviating factors noted.  Pt is currently on Xarelto and is compliant. He notes he was started on penicillin for abscess on 05/31/16 which was incised and drained on the same day.    Martinique- Cardiologist   Past Medical History:  Diagnosis Date  . Anxiety   . Arthritis    left knee  . Asthma    no inhaler use in 1 year  . Factor V Leiden ([place])   . GERD (gastroesophageal reflux disease)   . History of pulmonary embolus (PE)    takes Xarelto  . History of seizures    no known cause, per pt. - has been "years" since last seizure; no longer on anticonvulsant  . HLD (hyperlipidemia)   . Hypertension    states BP "runs high"; has been on med. "a while"  . Lateral meniscus tear 01/2015   left knee  . Shortness of breath dyspnea   . Sleep apnea    had sleep study 10/2013:  "severe" sleep apnea, states could not afford CPAP machine    Patient Active Problem List   Diagnosis Date Noted  . Dental abscess 06/01/2016  . Morbid obesity due to excess calories ([place])   . Presbyopia 01/26/2016  . Alcohol abuse 01/26/2016  . Hypertensive heart disease 10/01/2015  . Chronic systolic CHF (congestive heart  failure) ([place]) 10/01/2015  . Umbilical hernia without obstruction and without gangrene 08/29/2015  . Pre-diabetes 08/29/2015  . CKD (chronic kidney disease), stage III 08/19/2015  . 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 Cocaine use 12/23/2014  . Depression 10/10/2014  . GERD (gastroesophageal reflux disease) 07/24/2014  . Polycythemia, secondary 05/23/2014  . Factor 5 Leiden mutation, heterozygous ([place]) 05/22/2014  . Hyperlipidemia 04/23/2014  . Sinusitis, chronic 04/23/2014  . Severe obstructive sleep apnea 11/20/2013  . Essential hypertension, benign 08/04/2013  . Asthma 02/20/2013  . Osteoarthritis of left knee 06/28/2012  . Long term current use of anticoagulant therapy 12/02/2011  . Bilateral pulmonary embolism ([place]) 11/27/2011  . Tobacco abuse 11/27/2011    Past Surgical History:  Procedure Laterality Date  . CARDIAC CATHETERIZATION N/A 02/19/2015   Procedure: Left Heart Cath and Coronary Angiography;  Surgeon: Peter M Martinique, MD;  Location: [facility];  Service: Cardiovascular;  Laterality: N/A;  . CHONDROPLASTY Left 03/26/2015   Procedure: CHONDROPLASTY;  Surgeon: Leandrew Koyanagi, MD;  Location: [facility];  Service: Orthopedics;  Laterality: Left;  . ESOPHAGOGASTRODUODENOSCOPY (EGD) WITH PROPOFOL  07/10/2014  . HAND SURGERY Right    states knuckle removed  . KNEE ARTHROSCOPY WITH LATERAL MENISECTOMY Left 03/26/2015   Procedure: LEFT KNEE ARTHROSCOPY WITH LATERAL MENISECTOMY  CHONDROPLASTY;  Surgeon:  Leandrew Koyanagi, MD;  Location: [facility];  Service: Orthopedics;  Laterality: Left;  . ORIF FEMUR FRACTURE Left   . VIDEO BRONCHOSCOPY Bilateral 12/06/2012   Procedure: VIDEO BRONCHOSCOPY WITHOUT FLUORO;  Surgeon: Juanito Doom, MD;  Location: [facility];  Service: Cardiopulmonary;  Laterality: Bilateral;       Home Medications    Prior to Admission medications   Medication Sig Start Date End Date Taking? Authorizing  Provider  albuterol (PROVENTIL HFA;VENTOLIN HFA) 108 (90 Base) MCG/ACT inhaler Inhale 2 puffs into the lungs every 6 (six) hours as needed for wheezing or shortness of breath. 03/18/16  Yes Alexa Angela Burke, MD  amoxicillin-clavulanate (AUGMENTIN) 875-125 MG tablet Take 1 tablet by mouth 2 (two) times daily. 06/01/16  Yes Zada Finders, MD  furosemide (LASIX) 20 MG tablet Take 1 tablet (20 mg total) by mouth daily. 03/18/16 03/18/17 Yes Alexa Angela Burke, MD  hydrALAZINE (APRESOLINE) 25 MG tablet Take 1 tablet (25 mg total) by mouth 3 (three) times daily. 04/21/16  Yes Alexa Angela Burke, MD  HYDROcodone-acetaminophen (NORCO/VICODIN) 5-325 MG tablet Take 1-2 tablets by mouth every 6 (six) hours as needed for severe pain. 05/31/16  Yes Alexandra M Law, PA-C  isosorbide mononitrate (IMDUR) 30 MG 24 hr tablet Take 1 tablet (30 mg total) by mouth daily. 03/18/16  Yes Alexa Angela Burke, MD  lisinopril (PRINIVIL,ZESTRIL) 40 MG tablet Take 1 tablet (40 mg total) by mouth daily. 03/18/16  Yes Alexa Angela Burke, MD  mometasone-formoterol (DULERA) 200-5 MCG/ACT AERO Inhale 2 puffs into the lungs 2 (two) times daily. 03/18/16  Yes Alexa Angela Burke, MD  rivaroxaban (XARELTO) 20 MG TABS tablet Take 1 tablet (20 mg total) by mouth daily with supper. 03/18/16  Yes Alexa Angela Burke, MD  venlafaxine (EFFEXOR) 75 MG tablet Take 75 mg by mouth daily.   Yes Historical Provider, MD    Family History Family History  Problem Relation Age of Onset  . Diabetes Mother   . Hypertension Mother   . Hearing loss Father     died in his 43's  . Cancer Father     unknown type  . Clotting disorder Father   . Heart disease Father   . Diabetes Maternal Aunt   . Hypertension Maternal Aunt   . Diabetes Maternal Uncle   . Hypertension Maternal Uncle   . Stomach cancer Maternal Uncle   . Lung cancer Paternal Barbaraann Rondo     was a smoker  . Breast cancer Maternal Aunt   . Stomach cancer Maternal Uncle   . Liver disease Maternal Uncle     drinker  . Kidney disease  Maternal Aunt     Social History Social History  Substance Use Topics  . Smoking status: Current Every Day Smoker    Packs/day: 1.00    Years: 21.00    Types: Cigarettes  . Smokeless tobacco: Never Used     Comment: Stress.  . Alcohol use 0.0 oz/week     Comment: weekends     Allergies   Bee venom; Honey; Shellfish allergy; Ibuprofen; and Betadine [povidone iodine]   Review of Systems Review of Systems  10 systems reviewed and all are negative for acute change except as noted in the HPI.   Physical Exam Updated Vital Signs BP (!) 171/129   Pulse 116   Temp 98.4 F (36.9 C) (Oral)   Resp 24   SpO2 91%   Physical Exam  Constitutional: He is oriented to person, place, and  time. Vital signs are normal. He appears well-developed and well-nourished.  Non-toxic appearance. He does not appear ill. No distress.  Obese  HENT:  Head: Normocephalic and atraumatic.  Nose: Nose normal.  Mouth/Throat: Oropharynx is clear and moist. No oropharyngeal exudate.  Eyes: Conjunctivae and EOM are normal. Pupils are equal, round, and reactive to light. No scleral icterus.  Neck: Normal range of motion. Neck supple. No tracheal deviation, no edema, no erythema and normal range of motion present. No thyroid mass and no thyromegaly present.  Cardiovascular: Regular rhythm, S1 normal, S2 normal, normal heart sounds, intact distal pulses and normal pulses.  Tachycardia present.  Exam reveals no gallop and no friction rub.   No murmur heard. Pulmonary/Chest: Tachypnea noted. No respiratory distress. He has no wheezes. He has no rhonchi. He has rales.  crackles bilateral bases  Abdominal: Soft. Normal appearance and bowel sounds are normal. He exhibits no distension, no ascites and no mass. There is no hepatosplenomegaly. There is no tenderness. There is no rebound, no guarding and no CVA tenderness.  Musculoskeletal: Normal range of motion. He exhibits edema (BLE). He exhibits no tenderness.    Lymphadenopathy:    He has no cervical adenopathy.  Neurological: He is alert and oriented to person, place, and time. He has normal strength. No cranial nerve deficit or sensory deficit.  Skin: Skin is warm, dry and intact. No petechiae and no rash noted. He is not diaphoretic. No erythema. No pallor.  Nursing note and vitals reviewed.    ED Treatments / Results  DIAGNOSTIC STUDIES:  Oxygen Saturation is 96% on RA, normal by my interpretation.    COORDINATION OF CARE:  3:52 AM Discussed treatment plan with pt at bedside and pt agreed to plan.  Labs (all labs ordered are listed, but only abnormal results are displayed) Labs Reviewed  BRAIN NATRIURETIC PEPTIDE - Abnormal; Notable for the following:       Result Value   B Natriuretic Peptide 498.9 (*)    All other components within normal limits  BASIC METABOLIC PANEL - Abnormal; Notable for the following:    CO2 19 (*)    Glucose, Bld 117 (*)    Creatinine, Ser 1.59 (*)    Calcium 8.4 (*)    GFR calc non Af Amer 53 (*)    All other components within normal limits  PROTIME-INR - Abnormal; Notable for the following:    Prothrombin Time 23.0 (*)    All other components within normal limits  CBC WITH DIFFERENTIAL/PLATELET    EKG  EKG Interpretation None       Radiology Dg Chest 2 View  Result Date: 06/02/2016 CLINICAL DATA:  Initial evaluation for acute shortness of breath. EXAM: CHEST  2 VIEW COMPARISON:  Prior radiograph from 05/31/2016. FINDINGS: Cardiomegaly is stable. Mediastinal silhouette within normal limits. Lungs normally inflated. Diffuse vascular congestion with interstitial prominence and scattered Kerley B-lines, compatible with pulmonary edema. No pleural effusion. No focal infiltrates. No pneumothorax. No acute osseous abnormality. IMPRESSION: Cardiomegaly with diffuse vascular congestion and interstitial prominence, compatible with moderate diffuse pulmonary edema. Changes are worsened relative to recent  radiograph from 05/31/2016. Electronically Signed   By: Jeannine Boga M.D.   On: 06/02/2016 04:13   Dg Chest 2 View  Result Date: 05/31/2016 CLINICAL DATA:  Shortness of breath. EXAM: CHEST  2 VIEW COMPARISON:  05/29/2016 . FINDINGS: Cardiomegaly with pulmonary vascular prominence and bilateral interstitial prominence noted consistent with congestive heart failure. Interim improvement from prior exam. No  pleural effusion or pneumothorax. IMPRESSION: Cardiomegaly with pulmonary vascular prominence and bilateral interstitial prominence consistent with mild congestive heart failure. Interim improvement from prior exam . Electronically Signed   By: Marcello Moores  Register   On: 05/31/2016 07:18    Procedures Procedures (including critical care time)  Medications Ordered in ED Medications  furosemide (LASIX) injection 80 mg (80 mg Intravenous Given 06/02/16 0515)     Initial Impression / Assessment and Plan / ED Course  I have reviewed the triage vital signs and the nursing notes.  Pertinent labs & imaging results that were available during my care of the patient were reviewed by me and considered in my medical decision making (see chart for details).  Clinical Course    Patient presents to the ED for worsening SOB, DOE, and sleep orthopnea.  He states he recently ran out of his lasix 2 days ago and his symptoms always worsen quickly.  He denies CP.  He states he can feel the fluid on his chest.  HE is compliant with xarelto for his PE.     Patient is also requesting abx for swelling in his mouth.  PE shows swelling but no abscess formation in the R lower gums.  He has overall poor dentition as well.   Will DC with augmentin as he recently failed PCN.He was given 80mg  IV lasix in the ED an diuresed well.  Advised for cardiology fu within 3 days.  Rx was provided.  He appears well and in NAD. He is tachycardic but states he used inhalers prior to ED arrival.    Final Clinical Impressions(s) /  ED Diagnoses   Final diagnoses:  None    New Prescriptions New Prescriptions   No medications on file      I personally performed the services described in this documentation, which was scribed in my presence. The recorded information has been reviewed and is accurate.      Everlene Balls, MD 06/02/16 [id number]    Everlene Balls, MD 06/02/16 [phone]

## 2016-06-02 NOTE — ED Notes (Signed)
Patient transported to X-ray 

## 2016-06-02 NOTE — ED Triage Notes (Signed)
Per EMS pt has generalized weakness with SOB that has progressed throughout the day; Pt has Hx of CHF; pt has not gotten Rx of Lasix he received a couple of days ago; pt was placed on 3L of O2 sats went from 93% to 96% with O2; Pt a&ox 4 on arrival; Pt denies chest pain;

## 2016-06-02 NOTE — ED Notes (Signed)
MD at bedside. 

## 2016-06-03 ENCOUNTER — Ambulatory Visit (INDEPENDENT_AMBULATORY_CARE_PROVIDER_SITE_OTHER): Payer: Self-pay | Admitting: Internal Medicine

## 2016-06-03 ENCOUNTER — Telehealth: Payer: Self-pay | Admitting: *Deleted

## 2016-06-03 ENCOUNTER — Other Ambulatory Visit: Payer: Self-pay | Admitting: Internal Medicine

## 2016-06-03 ENCOUNTER — Emergency Department (HOSPITAL_COMMUNITY)
Admission: EM | Admit: 2016-06-03 | Discharge: 2016-06-03 | Disposition: A | Discharge status: Home / Self Care | Payer: Self-pay | Attending: Physician Assistant | Admitting: Physician Assistant

## 2016-06-03 ENCOUNTER — Encounter (HOSPITAL_COMMUNITY): Payer: Self-pay

## 2016-06-03 ENCOUNTER — Ambulatory Visit: Payer: Self-pay | Admitting: Pharmacist

## 2016-06-03 VITALS — BP 140/98 | HR 118 | Temp 98.4°F | Ht 75.0 in | Wt 381.2 lb

## 2016-06-03 DIAGNOSIS — N183 Chronic kidney disease, stage 3 (moderate): Secondary | ICD-10-CM | POA: Insufficient documentation

## 2016-06-03 DIAGNOSIS — J45909 Unspecified asthma, uncomplicated: Secondary | ICD-10-CM | POA: Insufficient documentation

## 2016-06-03 DIAGNOSIS — K047 Periapical abscess without sinus: Secondary | ICD-10-CM

## 2016-06-03 DIAGNOSIS — Z79899 Other long term (current) drug therapy: Secondary | ICD-10-CM

## 2016-06-03 DIAGNOSIS — I130 Hypertensive heart and chronic kidney disease with heart failure and stage 1 through stage 4 chronic kidney disease, or unspecified chronic kidney disease: Secondary | ICD-10-CM | POA: Insufficient documentation

## 2016-06-03 DIAGNOSIS — F17210 Nicotine dependence, cigarettes, uncomplicated: Secondary | ICD-10-CM | POA: Insufficient documentation

## 2016-06-03 DIAGNOSIS — Z7901 Long term (current) use of anticoagulants: Secondary | ICD-10-CM | POA: Insufficient documentation

## 2016-06-03 DIAGNOSIS — I5022 Chronic systolic (congestive) heart failure: Secondary | ICD-10-CM | POA: Insufficient documentation

## 2016-06-03 MED ORDER — AMOXICILLIN-POT CLAVULANATE 875-125 MG PO TABS
1.0000 | ORAL_TABLET | Freq: Once | ORAL | Status: AC
  Administered 2016-06-03: 1 via ORAL
  Filled 2016-06-03: qty 1

## 2016-06-03 NOTE — Patient Instructions (Addendum)
Mr. Toso it was nice meeting you today.  -You may take over-the-counter Tylenol as needed for pain  -Start taking Augmentin  -STOP taking Penicillin  -Please make your dentist appointment as soon as possible.

## 2016-06-03 NOTE — Progress Notes (Deleted)
Patient presented to clinic today on 06/03/16 for medication review and reconciliation. Patient reported that he was out of Albuterol, which he has been using daily Q6H for SOB. He was recently seen in the ED due to dental abscess and associated pain, and requested an appointment in acute care [facility] clinic with Dr. Posey Pronto to be evaluated for pain management. Dr. Posey Pronto agreed to see [name] in clinic today. Patient also reported no response to Venlafaxine. It is recommended that patient be evaluated for additional medication management for depression/anxiety.

## 2016-06-03 NOTE — Telephone Encounter (Signed)
Donald Brady, I agree. Thank you very much for for calling the dental clinic and recommend that he take the antibiotic with food. I appreciate you de-escalating that situation.

## 2016-06-03 NOTE — Consult Note (Signed)
Medication Samples have been provided to the patient.  Drug: Proventil HFA Strength: 33mcg/actuation Qty: 1 LOT:      [id number] Exp.Date: 01/2018 Dosing instructions: Inhale 2 puffs into lungs Q6H prn for SOB and wheezing  The patient has been instructed regarding the correct time, dose, and frequency of taking this medication, including desired effects and most common side effects.   Samples signed for by Dr. Flossie Dibble, PharmD.     Donald Brady 11:04 AM 06/03/2016

## 2016-06-03 NOTE — ED Triage Notes (Signed)
Patient here with 3rd visit for abscess to right lower gum. States that the swelling ha gotten worse.

## 2016-06-03 NOTE — Assessment & Plan Note (Addendum)
History of present illness During his previous visit, patient was advised to stop taking penicillin and start taking Augmentin. Patient states he never went to the pharmacy to get the Augmentin and continued to use penicillin all this time. He visited the ED this morning where he was given a dose of Augmentin and nerve block was performed. I&D was also attempted in the ED and per ED provider documentation no purulence was expressed. At present, patient states he is having difficulty eating solid foods due to pain and has been eating ice cream instead. Denies having any difficulty breathing. Denies having any fevers or chills. States he's currently in the process of scheduling his dentist appointment. States he is allergic to ibuprofen and the only medication that helps him his Vicodin. He was prescribed Vicodin 5-325 mg #10 tablets 3 days ago. When I suggested he take Tylenol, patient stated "so this was just a waste of my time."   Assessment Patient's dental pain is likely secondary to dental caries. He continues to have gum swelling but no pus or drainage noted in the area. He does not have any signs or symptoms of dental abscess and is afebrile. He is slightly tachycardic which is consistent with pulse rate seen during previous visit as well. He is swallowing his secretions well and is able to eat soft foods. Exam not concerning for Ludwig's angina, trismus, or deep soft tissue infection of the neck..  Plan -Advised patient to start taking Augmentin 875-125 mg twice daily for a full seven-day course -Advised him to stop taking penicillin -Advised him to make his dentist appointment as soon as possible -Over-the-counter Tylenol as needed for pain. He just received a nerve block in the ED this morning which should help as well.

## 2016-06-03 NOTE — ED Provider Notes (Signed)
[facility] DEPT Provider Note   CSN: [id number] Arrival date & time: 06/03/16  [id number]  History   Chief Complaint No chief complaint on file.   HPI Donald Brady is a 40 y.o. male.  HPI  40 y.o. male, presents to the Emergency Department today complaining of right lower dental pain since 05-29-16. Seen on 05-31-16 for same and had incision and drainage done with Rx Penicillin. Pt followed up with internal medicine clinic on 06-01-16 and was changed to Augmentin. Pt has not filled medication yet. Notes no new fevers since then. States abscess is back. Hurts to chew. No pain on ROM of neck. No breathing difficulty. States pain is 10/10. OTC medications with minimal relief. Pt Is seeing internal medicine clinic today at appointment at 10am. No other symptoms noted.   Past Medical History:  Diagnosis Date  . Anxiety   . Arthritis    left knee  . Asthma    no inhaler use in 1 year  . Factor V Leiden ([place])   . GERD (gastroesophageal reflux disease)   . History of pulmonary embolus (PE)    takes Xarelto  . History of seizures    no known cause, per pt. - has been "years" since last seizure; no longer on anticonvulsant  . HLD (hyperlipidemia)   . Hypertension    states BP "runs high"; has been on med. "a while"  . Lateral meniscus tear 01/2015   left knee  . Shortness of breath dyspnea   . Sleep apnea    had sleep study 10/2013:  "severe" sleep apnea, states could not afford CPAP machine    Patient Active Problem List   Diagnosis Date Noted  . Dental abscess 06/01/2016  . Morbid obesity due to excess calories ([place])   . Presbyopia 01/26/2016  . Alcohol abuse 01/26/2016  . Hypertensive heart disease 10/01/2015  . Chronic systolic CHF (congestive heart failure) ([place]) 10/01/2015  . Umbilical hernia without obstruction and without gangrene 08/29/2015  . Pre-diabetes 08/29/2015  . CKD (chronic kidney disease), stage III 08/19/2015  . Nonobstructive cardiomyopathy ([place]) 05/05/2015    . Cocaine use 12/23/2014  . Depression 10/10/2014  . GERD (gastroesophageal reflux disease) 07/24/2014  . Polycythemia, secondary 05/23/2014  . Factor 5 Leiden mutation, heterozygous ([place]) 05/22/2014  . Hyperlipidemia 04/23/2014  . Sinusitis, chronic 04/23/2014  . Severe obstructive sleep apnea 11/20/2013  . Essential hypertension, benign 08/04/2013  . Asthma 02/20/2013  . Osteoarthritis of left knee 06/28/2012  . Long term current use of anticoagulant therapy 12/02/2011  . Bilateral pulmonary embolism ([place]) 11/27/2011  . Tobacco abuse 11/27/2011    Past Surgical History:  Procedure Laterality Date  . CARDIAC CATHETERIZATION N/A 02/19/2015   Procedure: Left Heart Cath and Coronary Angiography;  Surgeon: Peter M Martinique, MD;  Location: [facility];  Service: Cardiovascular;  Laterality: N/A;  . CHONDROPLASTY Left 03/26/2015   Procedure: CHONDROPLASTY;  Surgeon: Leandrew Koyanagi, MD;  Location: [facility];  Service: Orthopedics;  Laterality: Left;  . ESOPHAGOGASTRODUODENOSCOPY (EGD) WITH PROPOFOL  07/10/2014  . HAND SURGERY Right    states knuckle removed  . KNEE ARTHROSCOPY WITH LATERAL MENISECTOMY Left 03/26/2015   Procedure: LEFT KNEE ARTHROSCOPY WITH LATERAL MENISECTOMY  CHONDROPLASTY;  Surgeon: Leandrew Koyanagi, MD;  Location: [facility];  Service: Orthopedics;  Laterality: Left;  . ORIF FEMUR FRACTURE Left   . VIDEO BRONCHOSCOPY Bilateral 12/06/2012   Procedure: VIDEO BRONCHOSCOPY WITHOUT FLUORO;  Surgeon: Juanito Doom, MD;  Location: [facility];  Service: Cardiopulmonary;  Laterality: Bilateral;       Home Medications    Prior to Admission medications   Medication Sig Start Date End Date Taking? Authorizing Provider  albuterol (PROVENTIL HFA;VENTOLIN HFA) 108 (90 Base) MCG/ACT inhaler Inhale 2 puffs into the lungs every 6 (six) hours as needed for wheezing or shortness of breath. 03/18/16   Florinda Marker, MD  amoxicillin-clavulanate  (AUGMENTIN) 875-125 MG tablet Take 1 tablet by mouth 2 (two) times daily. 06/01/16   Zada Finders, MD  furosemide (LASIX) 40 MG tablet Take 1 tablet (40 mg total) by mouth 2 (two) times daily. 06/02/16   Everlene Balls, MD  hydrALAZINE (APRESOLINE) 25 MG tablet Take 1 tablet (25 mg total) by mouth 3 (three) times daily. 04/21/16   Florinda Marker, MD  HYDROcodone-acetaminophen (NORCO/VICODIN) 5-325 MG tablet Take 1-2 tablets by mouth every 6 (six) hours as needed for severe pain. 05/31/16   Frederica Kuster, PA-C  isosorbide mononitrate (IMDUR) 30 MG 24 hr tablet Take 1 tablet (30 mg total) by mouth daily. 03/18/16   Florinda Marker, MD  lisinopril (PRINIVIL,ZESTRIL) 40 MG tablet Take 1 tablet (40 mg total) by mouth daily. 03/18/16   Alexa Angela Burke, MD  mometasone-formoterol (DULERA) 200-5 MCG/ACT AERO Inhale 2 puffs into the lungs 2 (two) times daily. 03/18/16   Florinda Marker, MD  rivaroxaban (XARELTO) 20 MG TABS tablet Take 1 tablet (20 mg total) by mouth daily with supper. 03/18/16   Florinda Marker, MD  venlafaxine (EFFEXOR) 75 MG tablet Take 75 mg by mouth daily.    Historical Provider, MD    Family History Family History  Problem Relation Age of Onset  . Diabetes Mother   . Hypertension Mother   . Hearing loss Father     died in his 50's  . Cancer Father     unknown type  . Clotting disorder Father   . Heart disease Father   . Diabetes Maternal Aunt   . Hypertension Maternal Aunt   . Diabetes Maternal Uncle   . Hypertension Maternal Uncle   . Stomach cancer Maternal Uncle   . Lung cancer Paternal Barbaraann Rondo     was a smoker  . Breast cancer Maternal Aunt   . Stomach cancer Maternal Uncle   . Liver disease Maternal Uncle     drinker  . Kidney disease Maternal Aunt     Social History Social History  Substance Use Topics  . Smoking status: Current Every Day Smoker    Packs/day: 1.00    Years: 21.00    Types: Cigarettes  . Smokeless tobacco: Never Used     Comment: Stress.  . Alcohol use 0.0  oz/week     Comment: weekends     Allergies   Bee venom; Honey; Shellfish allergy; Ibuprofen; and Betadine [povidone iodine]   Review of Systems Review of Systems  Constitutional: Negative for fever.  HENT: Positive for dental problem.   Respiratory: Negative for shortness of breath.   Gastrointestinal: Negative for nausea and vomiting.   Physical Exam Updated Vital Signs BP (!) 148/105 (BP Location: Left Arm)   Pulse 118   Temp 98.3 F (36.8 C) (Oral)   Resp 22   Ht 6\' 3"  (1.905 m)   Wt (!) 172 kg   SpO2 96%   BMI 47.39 kg/m   Physical Exam  Constitutional: He is oriented to person, place, and time. Vital signs are normal. He appears well-developed and  well-nourished.  HENT:  Head: Normocephalic.  Right Ear: Hearing normal.  Left Ear: Hearing normal.  Mouth/Throat: Uvula is midline, oropharynx is clear and moist and mucous membranes are normal. No trismus in the jaw. Abnormal dentition. Dental abscesses present. No uvula swelling. No posterior oropharyngeal erythema or tonsillar abscesses.    Infection noted on right lower mandible with unilateral facial swelling. Non Fluctuant on palpation. No Ludwigs Angina. No Trismus. ROM of neck intact. No profuse drainage noted.   Eyes: Conjunctivae and EOM are normal. Pupils are equal, round, and reactive to light.  Neck: Normal range of motion. Neck supple.  Cardiovascular: Normal rate, regular rhythm, normal heart sounds and intact distal pulses.   Pulmonary/Chest: Effort normal and breath sounds normal.  Neurological: He is alert and oriented to person, place, and time.  Skin: Skin is warm and dry.  Psychiatric: He has a normal mood and affect. His speech is normal and behavior is normal. Thought content normal.  Nursing note and vitals reviewed.  ED Treatments / Results  Labs (all labs ordered are listed, but only abnormal results are displayed) Labs Reviewed - No data to display  EKG  EKG Interpretation None       Radiology Dg Chest 2 View  Result Date: 06/02/2016 CLINICAL DATA:  Initial evaluation for acute shortness of breath. EXAM: CHEST  2 VIEW COMPARISON:  Prior radiograph from 05/31/2016. FINDINGS: Cardiomegaly is stable. Mediastinal silhouette within normal limits. Lungs normally inflated. Diffuse vascular congestion with interstitial prominence and scattered Kerley B-lines, compatible with pulmonary edema. No pleural effusion. No focal infiltrates. No pneumothorax. No acute osseous abnormality. IMPRESSION: Cardiomegaly with diffuse vascular congestion and interstitial prominence, compatible with moderate diffuse pulmonary edema. Changes are worsened relative to recent radiograph from 05/31/2016. Electronically Signed   By: Jeannine Boga M.D.   On: 06/02/2016 04:13   Procedures .Nerve Block Date/Time: 06/03/2016 8:15 AM Performed by: Shary Decamp Authorized by: Shary Decamp   Consent:    Consent obtained:  Verbal   Consent given by:  Patient   Risks discussed:  Infection, intravenous injection, bleeding, pain and allergic reaction   Alternatives discussed:  No treatment Indications:    Indications:  Pain relief Location:    Nerve block body site: Dental. Procedure details (see MAR for exact dosages):    Block needle gauge:  25 G   Injection procedure:  Anatomic landmarks identified Post-procedure details:    Outcome:  Pain relieved   Patient tolerance of procedure:  Tolerated well, no immediate complications .Marland KitchenIncision and Drainage Date/Time: 06/03/2016 8:17 AM Performed by: Shary Decamp Authorized by: Shary Decamp   Consent:    Consent obtained:  Verbal   Consent given by:  Patient   Risks discussed:  Bleeding, pain, infection and incomplete drainage   Alternatives discussed:  No treatment Location:    Type:  Abscess   Location:  Mouth   Mouth location: right molar. Procedure type:    Complexity:  Simple Procedure details:    Incision types:  Single straight    Scalpel blade:  11   Drainage:  Bloody   Drainage amount:  Scant   Wound treatment:  Wound left open   Packing materials:  1/2 in gauze Post-procedure details:    Patient tolerance of procedure:  Tolerated well, no immediate complications   (including critical care time)  Medications Ordered in ED Medications  amoxicillin-clavulanate (AUGMENTIN) 875-125 MG per tablet 1 tablet (not administered)   Initial Impression / Assessment and Plan / ED Course  I have reviewed the triage vital signs and the nursing notes.  Pertinent labs & imaging results that were available during my care of the patient were reviewed by me and considered in my medical decision making (see chart for details).  Clinical Course    Final Clinical Impressions(s) / ED Diagnoses  I have reviewed the relevant previous healthcare records. I obtained HPI from historian.  ED Course:  Assessment: Dental pain associated with dental cary but no signs or symptoms of dental abscess with patient afebrile, non toxic appearing and swallowing secretions well. Exam unconcerning for Ludwig's angina or other deep tissue infection in neck. There is gum swelling, and facial swelling,given Augmentin and performed nerve block in ED. Attempted to I and D area, but no purulence was expressed. Likely residual inflammation from infection remains. Pt has not taken Augmentin from PCP. Urged to fill ABX that was previously Rxed and fill. Has follow up today in clinic for scheduling dentist follow up. Urged patient to follow-up with dentist.    Disposition/Plan:  [place] Additional Verbal discharge instructions given and discussed with patient.  Pt Instructed to f/u with Dentist in the next week for evaluation and treatment of symptoms. Return precautions given Pt acknowledges and agrees with plan  Supervising Physician Courteney Julio Alm, MD   Final diagnoses:  Dental infection    New Prescriptions New Prescriptions   No  medications on file     Shary Decamp, PA-C 06/03/16 0818    Courteney Lyn Mackuen, MD 06/03/16 1513

## 2016-06-03 NOTE — Discharge Instructions (Signed)
Please read and follow all provided instructions.  Your diagnoses today include:  1. Dental infection    Tests performed today include: Vital signs. See below for your results today.   Medications prescribed:  Take as prescribed   Home care instructions:  Follow any educational materials contained in this packet.  Follow-up instructions: Please follow-up with your Dentist for further evaluation of symptoms and treatment   Return instructions:  Please return to the Emergency Department if you do not get better, if you get worse, or new symptoms OR  - Fever (temperature greater than 101.15F)  - Bleeding that does not stop with holding pressure to the area    -Severe pain (please note that you may be more sore the day after your accident)  - Chest Pain  - Difficulty breathing  - Severe nausea or vomiting  - Inability to tolerate food and liquids  - Passing out  - Skin becoming red around your wounds  - Change in mental status (confusion or lethargy)  - New numbness or weakness    Please return if you have any other emergent concerns.  Additional Information:  Your vital signs today were: BP (!) 148/105 (BP Location: Left Arm)    Pulse 118    Temp 98.3 F (36.8 C) (Oral)    Resp 22    Ht 6\' 3"  (1.905 m)    Wt (!) 172 kg    SpO2 96%    BMI 47.39 kg/m  If your blood pressure (BP) was elevated above 135/85 this visit, please have this repeated by your doctor within one month. ---------------

## 2016-06-03 NOTE — Progress Notes (Signed)
   CC: Patient is here for ED follow-up of tooth pain.  HPI:  Donald Brady is a 40 y.o. male with a past medical history of conditions listed below presenting to the clinic for ED follow-up of tooth pain. Please see problem based charting for the status of the patient's current and chronic medical conditions.   Past Medical History:  Diagnosis Date  . Anxiety   . Arthritis    left knee  . Asthma    no inhaler use in 1 year  . Factor V Leiden ([place])   . GERD (gastroesophageal reflux disease)   . History of pulmonary embolus (PE)    takes Xarelto  . History of seizures    no known cause, per pt. - has been "years" since last seizure; no longer on anticonvulsant  . HLD (hyperlipidemia)   . Hypertension    states BP "runs high"; has been on med. "a while"  . Lateral meniscus tear 01/2015   left knee  . Shortness of breath dyspnea   . Sleep apnea    had sleep study 10/2013:  "severe" sleep apnea, states could not afford CPAP machine    Review of Systems:  Pertinent positives mentioned in HPI. Remainder of all ROS negative.   Physical Exam:  Vitals:   06/03/16 1044  BP: (!) 140/98  Pulse: (!) 118  Temp: 98.4 F (36.9 C)  TempSrc: Oral  SpO2: 95%  Weight: (!) 381 lb 3.2 oz (172.9 kg)  Height: 6\' 3"  (1.905 m)   Physical Exam  Constitutional: He is oriented to person, place, and time. He appears well-developed and well-nourished. No distress.  HENT:  Head: Normocephalic and atraumatic.  Mouth/Throat: Oropharynx is clear and moist. No oropharyngeal exudate.    Multiple missing teeth, poor dentition. There is swelling of the right lateral lower 2nd molar. Area is non-TTP. No active drainage or discharge noted. No trismus.   Eyes: EOM are normal. Right eye exhibits no discharge. Left eye exhibits no discharge.  Neck: Neck supple. No tracheal deviation present.  Cardiovascular: Regular rhythm and intact distal pulses.   Sightly tachycardic   Pulmonary/Chest: Effort normal  and breath sounds normal. No respiratory distress.  Abdominal: Soft. Bowel sounds are normal. He exhibits no distension. There is no tenderness.  Musculoskeletal: Normal range of motion. He exhibits no edema.  Neurological: He is alert and oriented to person, place, and time.  Skin: Skin is warm and dry.    Assessment & Plan:   See Encounters Tab for problem based charting.  Patient discussed with Dr. Evette Doffing

## 2016-06-03 NOTE — Telephone Encounter (Signed)
Mother called hysterical, crying raising voice, "my boy is going' a die and it's goin to be yalls fault" it took appt 5 min to calm her down, she states he needs pain med, a dentist and should probably have been put in hospital as inpt. She also states his "belly" is killing him from all the infection "running into it". Explained to her that pain medicine will only make the abd discomfort worse possibly due to it causing constipation and that pt needs to eat when he takes the abx. Spoke w/ mamieh. She has sent paperwork into dental clinic. Advised mother to make sure pt takes abx  And to eat when he takes them. Called samantha at dental clinic she has worked him in on 10/26 at 86, informed mother, she is now happy and calm.

## 2016-06-04 NOTE — Progress Notes (Signed)
Patient was seen in clinic with Darcella Cheshire, PharmD candidate. I agree with the assessment and plan of care documented.  Patient stated his furosemide dose has increased and patient requested more days supply. Patient was educated that dose may require further titration and will assist patient with maintenance prescription once dose is established.

## 2016-06-04 NOTE — Progress Notes (Deleted)
Patient presented to clinic today on 06/03/16 for medication review and reconciliation. Patient reported that he was out of Albuterol, which he has been using daily Q6H for SOB. He was recently seen in the ED due to dental abscess and associated pain, and requested an appointment in acute care [facility] clinic with Dr. Posey Pronto to be evaluated for pain management. Dr. Posey Pronto agreed to see Donald Brady in clinic today. Patient also reported no response to Venlafaxine. It is recommended that patient be evaluated for additional medication management for depression/anxiety.

## 2016-06-04 NOTE — Progress Notes (Deleted)
Patient presented to clinic today on 06/03/16 for medication review and reconciliation. Patient reported that he was out of Albuterol, which he has been using daily Q6H for SOB. He was recently seen in the ED due to dental abscess and associated pain, and requested an appointment in acute care [facility] clinic with Dr. Posey Pronto to be evaluated for pain management. Dr. Posey Pronto agreed to see Donald Brady in clinic today. Patient also reported no response to Venlafaxine. It is recommended that patient be evaluated for additional medication management for depression/anxiety.

## 2016-06-04 NOTE — Progress Notes (Signed)
Internal Medicine Clinic Attending  Case discussed with Dr. Rathoreat the time of the visit. We reviewed the resident's history and exam and pertinent patient test results. I agree with the assessment, diagnosis, and plan of care documented in the resident's note.  

## 2016-06-07 NOTE — Progress Notes (Signed)
Internal Medicine Clinic Attending  Case discussed with Dr. Patel,Vishal at the time of the visit.  We reviewed the resident's history and exam and pertinent patient test results.  I agree with the assessment, diagnosis, and plan of care documented in the resident's note.  

## 2016-06-08 ENCOUNTER — Other Ambulatory Visit: Payer: Self-pay | Admitting: Pharmacist

## 2016-06-08 MED ORDER — BUPROPION HCL ER (SR) 150 MG PO TB12: 150.0000 mg | ORAL_TABLET | Freq: Two times a day (BID) | ORAL | 3 refills | Status: DC

## 2016-06-08 NOTE — Progress Notes (Signed)
Discussed with PCP management options for depression and decision agreed upon was to switch patient from venlafaxine to bupropion. Will taper venlafaxine and initiate bupropion 150 mg daily x 3 days then increase to 150 mg BID to also help with smoking cessation. Patient contacted and notified with verbalized understanding.

## 2016-06-09 ENCOUNTER — Telehealth: Payer: Self-pay

## 2016-06-09 NOTE — Telephone Encounter (Signed)
Called patient to inform him that he can pick up his Bupropion from outpatient pharmacy. Instructed patient to taper Venlafaxine capsules over 2 weeks by taking 1 capsule every other day for week 1 of taper and 1 capsule on MWF on week 2 of taper. Patient was instructed to discontinue Venlafaxine after week 2 of taper. Donald Brady also reports that he has a scheduled dental appt today involving 4 tooth extractions and has not taken Xarelto for 8-9 days. Instructed patient to take dose this evening and monitor for s/sx of thromboembolism such as pain/redness/warmth/swelling in lower extremities and SOB and to call PCP if he experiences any s/sx. Patient verbalized understanding.

## 2016-06-18 NOTE — Telephone Encounter (Signed)
Patient was contacted with Frank Tillman, PharmD candidate. I agree with the assessment and plan of care documented.  

## 2016-06-22 ENCOUNTER — Telehealth: Payer: Self-pay

## 2016-06-25 ENCOUNTER — Other Ambulatory Visit: Payer: Self-pay | Admitting: Pharmacist

## 2016-06-25 DIAGNOSIS — I5022 Chronic systolic (congestive) heart failure: Secondary | ICD-10-CM

## 2016-06-27 NOTE — Telephone Encounter (Addendum)
Patient was contacted with Frank Tillman, PharmD candidate. I agree with the assessment and plan of care documented.  

## 2016-06-28 ENCOUNTER — Ambulatory Visit: Payer: Self-pay | Admitting: Pharmacist

## 2016-06-28 MED ORDER — FUROSEMIDE 20 MG PO TABS: 20.0000 mg | ORAL_TABLET | Freq: Every day | ORAL | 3 refills | Status: DC

## 2016-06-28 MED FILL — FUROSEMIDE 20 MG TABLET: 20 | 30 days supply | Qty: 30 | Fill #0

## 2016-06-29 ENCOUNTER — Other Ambulatory Visit: Payer: Self-pay | Admitting: Pulmonary Disease

## 2016-07-02 MED FILL — HYDROCODON-APAP 5-325: 5-325 | 2 days supply | Qty: 10 | Fill #0

## 2016-07-05 ENCOUNTER — Other Ambulatory Visit: Payer: Self-pay | Admitting: Pharmacist

## 2016-07-05 DIAGNOSIS — J45909 Unspecified asthma, uncomplicated: Secondary | ICD-10-CM

## 2016-07-05 MED ORDER — ALBUTEROL SULFATE HFA 108 (90 BASE) MCG/ACT IN AERS: 2.0000 | INHALATION_SPRAY | Freq: Four times a day (QID) | RESPIRATORY_TRACT | 3 refills | Status: DC | PRN

## 2016-07-05 MED ORDER — MOMETASONE FURO-FORMOTEROL FUM 200-5 MCG/ACT IN AERO: 2.0000 | INHALATION_SPRAY | Freq: Two times a day (BID) | RESPIRATORY_TRACT | 3 refills | Status: DC

## 2016-07-05 NOTE — Progress Notes (Signed)
Patient called with refill request on inhalers.  He also called to state that he re-started venlafaxine because he has not been able to pick up the bupropion due to cost. He reports since re-starting venlafaxine he has noticed improvement in depression symptoms and prefers to stay on the venlafaxine for now. Advised patient to continue notifying us of his progress (would be able to increase venlafaxine dose if needed).

## 2016-07-07 ENCOUNTER — Ambulatory Visit: Payer: Self-pay | Admitting: Pharmacist

## 2016-08-02 ENCOUNTER — Other Ambulatory Visit: Payer: Self-pay | Admitting: Internal Medicine

## 2016-08-02 DIAGNOSIS — I2699 Other pulmonary embolism without acute cor pulmonale: Secondary | ICD-10-CM

## 2016-08-17 ENCOUNTER — Emergency Department (HOSPITAL_COMMUNITY): Payer: Self-pay

## 2016-08-17 ENCOUNTER — Inpatient Hospital Stay (HOSPITAL_COMMUNITY)
Admission: EM | Admit: 2016-08-17 | Discharge: 2016-08-20 | DRG: 291 | Disposition: A | Discharge status: Home / Self Care | Payer: Self-pay | Attending: Internal Medicine | Admitting: Internal Medicine

## 2016-08-17 ENCOUNTER — Telehealth: Payer: Self-pay | Admitting: *Deleted

## 2016-08-17 ENCOUNTER — Encounter (HOSPITAL_COMMUNITY): Payer: Self-pay | Admitting: Emergency Medicine

## 2016-08-17 ENCOUNTER — Telehealth: Payer: Self-pay | Admitting: Internal Medicine

## 2016-08-17 DIAGNOSIS — Z79899 Other long term (current) drug therapy: Secondary | ICD-10-CM

## 2016-08-17 DIAGNOSIS — I509 Heart failure, unspecified: Secondary | ICD-10-CM

## 2016-08-17 DIAGNOSIS — E785 Hyperlipidemia, unspecified: Secondary | ICD-10-CM | POA: Diagnosis present

## 2016-08-17 DIAGNOSIS — J449 Chronic obstructive pulmonary disease, unspecified: Secondary | ICD-10-CM | POA: Diagnosis present

## 2016-08-17 DIAGNOSIS — N179 Acute kidney failure, unspecified: Secondary | ICD-10-CM | POA: Diagnosis present

## 2016-08-17 DIAGNOSIS — Z6841 Body Mass Index (BMI) 40.0 and over, adult: Secondary | ICD-10-CM

## 2016-08-17 DIAGNOSIS — I428 Other cardiomyopathies: Secondary | ICD-10-CM

## 2016-08-17 DIAGNOSIS — F1020 Alcohol dependence, uncomplicated: Secondary | ICD-10-CM

## 2016-08-17 DIAGNOSIS — F419 Anxiety disorder, unspecified: Secondary | ICD-10-CM | POA: Diagnosis present

## 2016-08-17 DIAGNOSIS — I2510 Atherosclerotic heart disease of native coronary artery without angina pectoris: Secondary | ICD-10-CM | POA: Diagnosis present

## 2016-08-17 DIAGNOSIS — I5022 Chronic systolic (congestive) heart failure: Secondary | ICD-10-CM

## 2016-08-17 DIAGNOSIS — F1010 Alcohol abuse, uncomplicated: Secondary | ICD-10-CM | POA: Diagnosis present

## 2016-08-17 DIAGNOSIS — Z7951 Long term (current) use of inhaled steroids: Secondary | ICD-10-CM

## 2016-08-17 DIAGNOSIS — G4733 Obstructive sleep apnea (adult) (pediatric): Secondary | ICD-10-CM | POA: Diagnosis present

## 2016-08-17 DIAGNOSIS — I5023 Acute on chronic systolic (congestive) heart failure: Secondary | ICD-10-CM

## 2016-08-17 DIAGNOSIS — K219 Gastro-esophageal reflux disease without esophagitis: Secondary | ICD-10-CM | POA: Diagnosis present

## 2016-08-17 DIAGNOSIS — Z86711 Personal history of pulmonary embolism: Secondary | ICD-10-CM

## 2016-08-17 DIAGNOSIS — IMO0002 Reserved for concepts with insufficient information to code with codable children: Secondary | ICD-10-CM

## 2016-08-17 DIAGNOSIS — D6851 Activated protein C resistance: Secondary | ICD-10-CM | POA: Diagnosis present

## 2016-08-17 DIAGNOSIS — Z8249 Family history of ischemic heart disease and other diseases of the circulatory system: Secondary | ICD-10-CM

## 2016-08-17 DIAGNOSIS — Z832 Family history of diseases of the blood and blood-forming organs and certain disorders involving the immune mechanism: Secondary | ICD-10-CM

## 2016-08-17 DIAGNOSIS — I130 Hypertensive heart and chronic kidney disease with heart failure and stage 1 through stage 4 chronic kidney disease, or unspecified chronic kidney disease: Principal | ICD-10-CM | POA: Diagnosis present

## 2016-08-17 DIAGNOSIS — Z7901 Long term (current) use of anticoagulants: Secondary | ICD-10-CM

## 2016-08-17 DIAGNOSIS — N183 Chronic kidney disease, stage 3 unspecified: Secondary | ICD-10-CM | POA: Diagnosis present

## 2016-08-17 DIAGNOSIS — F329 Major depressive disorder, single episode, unspecified: Secondary | ICD-10-CM | POA: Diagnosis present

## 2016-08-17 DIAGNOSIS — F17210 Nicotine dependence, cigarettes, uncomplicated: Secondary | ICD-10-CM | POA: Diagnosis present

## 2016-08-17 DIAGNOSIS — F1090 Alcohol use, unspecified, uncomplicated: Secondary | ICD-10-CM

## 2016-08-17 DIAGNOSIS — Z833 Family history of diabetes mellitus: Secondary | ICD-10-CM

## 2016-08-17 DIAGNOSIS — F10239 Alcohol dependence with withdrawal, unspecified: Secondary | ICD-10-CM | POA: Diagnosis present

## 2016-08-17 DIAGNOSIS — I255 Ischemic cardiomyopathy: Secondary | ICD-10-CM | POA: Diagnosis present

## 2016-08-17 DIAGNOSIS — J45909 Unspecified asthma, uncomplicated: Secondary | ICD-10-CM

## 2016-08-17 DIAGNOSIS — I5043 Acute on chronic combined systolic (congestive) and diastolic (congestive) heart failure: Secondary | ICD-10-CM | POA: Diagnosis present

## 2016-08-17 LAB — CBC
HEMATOCRIT: 48.2 % (ref 39.0–52.0)
HEMOGLOBIN: 15.8 g/dL (ref 13.0–17.0)
MCH: 30.3 pg (ref 26.0–34.0)
MCHC: 32.8 g/dL (ref 30.0–36.0)
MCV: 92.3 fL (ref 78.0–100.0)
PLATELETS: 247 10*3/uL (ref 150–400)
RBC: 5.22 MIL/uL (ref 4.22–5.81)
RDW: 14.4 % (ref 11.5–15.5)
WBC: 6.8 10*3/uL (ref 4.0–10.5)

## 2016-08-17 LAB — BASIC METABOLIC PANEL
ANION GAP: 8 (ref 5–15)
BUN: 10 mg/dL (ref 6–20)
CHLORIDE: 108 mmol/L (ref 101–111)
CO2: 25 mmol/L (ref 22–32)
Calcium: 8.7 mg/dL — ABNORMAL LOW (ref 8.9–10.3)
Creatinine, Ser: 1.77 mg/dL — ABNORMAL HIGH (ref 0.61–1.24)
GFR calc non Af Amer: 47 mL/min — ABNORMAL LOW (ref >60)
GFR, EST AFRICAN AMERICAN: 54 mL/min — AB (ref >60)
Glucose, Bld: 94 mg/dL (ref 65–99)
POTASSIUM: 3.7 mmol/L (ref 3.5–5.1)
SODIUM: 141 mmol/L (ref 135–145)

## 2016-08-17 LAB — I-STAT TROPONIN, ED: Troponin i, poc: 0.06 ng/mL (ref 0.00–0.08)

## 2016-08-17 LAB — BRAIN NATRIURETIC PEPTIDE: B NATRIURETIC PEPTIDE 5: 803.6 pg/mL — AB (ref 0.0–100.0)

## 2016-08-17 LAB — DIFFERENTIAL
BASOS ABS: 0.1 10*3/uL (ref 0.0–0.1)
BASOS PCT: 1 %
EOS ABS: 0.2 10*3/uL (ref 0.0–0.7)
Eosinophils Relative: 3 %
Lymphocytes Relative: 23 %
Lymphs Abs: 1.5 10*3/uL (ref 0.7–4.0)
MONO ABS: 0.9 10*3/uL (ref 0.1–1.0)
MONOS PCT: 14 %
Neutro Abs: 3.9 10*3/uL (ref 1.7–7.7)
Neutrophils Relative %: 59 %

## 2016-08-17 MED ORDER — THIAMINE HCL 100 MG/ML IJ SOLN: 100.0000 mg | Freq: Every day | INTRAMUSCULAR | Status: DC

## 2016-08-17 MED ORDER — ACETAMINOPHEN 325 MG PO TABS
650.0000 mg | ORAL_TABLET | ORAL | Status: DC | PRN
  Administered 2016-08-17: 650 mg via ORAL
  Filled 2016-08-17: qty 2

## 2016-08-17 MED ORDER — MOMETASONE FURO-FORMOTEROL FUM 200-5 MCG/ACT IN AERO
2.0000 | INHALATION_SPRAY | Freq: Two times a day (BID) | RESPIRATORY_TRACT | Status: DC
  Administered 2016-08-18 – 2016-08-20 (×5): 2 via RESPIRATORY_TRACT
  Filled 2016-08-17: qty 8.8

## 2016-08-17 MED ORDER — CHLORDIAZEPOXIDE HCL 25 MG PO CAPS
50.0000 mg | ORAL_CAPSULE | Freq: Every day | ORAL | Status: DC
  Administered 2016-08-17 – 2016-08-18 (×2): 50 mg via ORAL
  Filled 2016-08-17 (×2): qty 2

## 2016-08-17 MED ORDER — LORAZEPAM 2 MG/ML IJ SOLN: 0.0000 mg | Freq: Two times a day (BID) | INTRAMUSCULAR | Status: DC

## 2016-08-17 MED ORDER — SODIUM CHLORIDE 0.9% FLUSH: 3.0000 mL | INTRAVENOUS | Status: DC | PRN

## 2016-08-17 MED ORDER — FOLIC ACID 1 MG PO TABS
1.0000 mg | ORAL_TABLET | Freq: Every day | ORAL | Status: DC
  Administered 2016-08-18 – 2016-08-20 (×4): 1 mg via ORAL
  Filled 2016-08-17 (×4): qty 1

## 2016-08-17 MED ORDER — ISOSORBIDE MONONITRATE ER 30 MG PO TB24
30.0000 mg | ORAL_TABLET | Freq: Every day | ORAL | Status: DC
  Administered 2016-08-18 – 2016-08-20 (×3): 30 mg via ORAL
  Filled 2016-08-17 (×3): qty 1

## 2016-08-17 MED ORDER — SODIUM CHLORIDE 0.9% FLUSH
3.0000 mL | Freq: Two times a day (BID) | INTRAVENOUS | Status: DC
  Administered 2016-08-17 – 2016-08-20 (×5): 3 mL via INTRAVENOUS

## 2016-08-17 MED ORDER — LORAZEPAM 2 MG/ML IJ SOLN
0.0000 mg | Freq: Four times a day (QID) | INTRAMUSCULAR | Status: DC
Start: 2016-08-17 — End: 2016-08-18
  Administered 2016-08-17 (×2): 1 mg via INTRAVENOUS
  Administered 2016-08-18: 2 mg via INTRAVENOUS
  Administered 2016-08-18: 1 mg via INTRAVENOUS
  Filled 2016-08-17 (×4): qty 1

## 2016-08-17 MED ORDER — FUROSEMIDE 10 MG/ML IJ SOLN
80.0000 mg | Freq: Once | INTRAMUSCULAR | Status: AC
  Administered 2016-08-17: 80 mg via INTRAVENOUS
  Filled 2016-08-17: qty 8

## 2016-08-17 MED ORDER — PANTOPRAZOLE SODIUM 40 MG PO TBEC
40.0000 mg | DELAYED_RELEASE_TABLET | Freq: Every day | ORAL | Status: DC
  Administered 2016-08-17 – 2016-08-20 (×4): 40 mg via ORAL
  Filled 2016-08-17 (×4): qty 1

## 2016-08-17 MED ORDER — FUROSEMIDE 10 MG/ML IJ SOLN
40.0000 mg | Freq: Once | INTRAMUSCULAR | Status: AC
  Administered 2016-08-17: 40 mg via INTRAVENOUS
  Filled 2016-08-17: qty 4

## 2016-08-17 MED ORDER — VITAMIN B-1 100 MG PO TABS
100.0000 mg | ORAL_TABLET | Freq: Every day | ORAL | Status: DC
  Administered 2016-08-18 – 2016-08-20 (×4): 100 mg via ORAL
  Filled 2016-08-17 (×4): qty 1

## 2016-08-17 MED ORDER — VITAMIN B-1 100 MG PO TABS: 100.0000 mg | ORAL_TABLET | Freq: Every day | ORAL | Status: DC

## 2016-08-17 MED ORDER — VENLAFAXINE HCL ER 75 MG PO CP24
75.0000 mg | ORAL_CAPSULE | Freq: Every day | ORAL | Status: DC
  Administered 2016-08-18: 75 mg via ORAL
  Filled 2016-08-17: qty 1

## 2016-08-17 MED ORDER — ONDANSETRON HCL 4 MG/2ML IJ SOLN
4.0000 mg | Freq: Four times a day (QID) | INTRAMUSCULAR | Status: DC | PRN
  Administered 2016-08-17 – 2016-08-19 (×2): 4 mg via INTRAVENOUS
  Filled 2016-08-17 (×2): qty 2

## 2016-08-17 MED ORDER — ALBUTEROL SULFATE (2.5 MG/3ML) 0.083% IN NEBU
3.0000 mL | INHALATION_SOLUTION | Freq: Four times a day (QID) | RESPIRATORY_TRACT | Status: DC | PRN
  Administered 2016-08-18 – 2016-08-20 (×2): 3 mL via RESPIRATORY_TRACT
  Filled 2016-08-17 (×2): qty 3

## 2016-08-17 MED ORDER — SODIUM CHLORIDE 0.9 % IV SOLN: 250.0000 mL | INTRAVENOUS | Status: DC | PRN

## 2016-08-17 MED ORDER — RIVAROXABAN 20 MG PO TABS
20.0000 mg | ORAL_TABLET | Freq: Every day | ORAL | Status: DC
  Administered 2016-08-17 – 2016-08-19 (×3): 20 mg via ORAL
  Filled 2016-08-17 (×3): qty 1

## 2016-08-17 MED ORDER — FOLIC ACID 1 MG PO TABS: 1.0000 mg | ORAL_TABLET | Freq: Every day | ORAL | Status: DC

## 2016-08-17 MED ORDER — DIPHENHYDRAMINE HCL 25 MG PO CAPS
25.0000 mg | ORAL_CAPSULE | Freq: Four times a day (QID) | ORAL | Status: DC | PRN
  Administered 2016-08-17: 25 mg via ORAL
  Filled 2016-08-17: qty 1

## 2016-08-17 NOTE — Telephone Encounter (Signed)
I agree he should be seen in the ED. It sounds like he may be in another heart failure exacerbation. Thank you Glenda.

## 2016-08-17 NOTE — Telephone Encounter (Signed)
[facility] office had received a call from pt's mother and asked me to call pt  I called pt to see how he's doing; he stated at first that he's doing pretty good. Then stated he had bought a pair of shoes before Christmas and they were "big" now his feet are swollen and cannot get them on. And the swelling is up to his knees and skin is burning. He can only sleep on his knees; leaning over the couch. And cannot walk any distance w/o "giving out of breath". This has been occurring since Christmas Eve. Said he had scheduled an appt for tomorrow - informed him , he needs to be seen today and go to the ED. Stated he will call his mother to see if she could bring him. Told him I will call back to see if his mother could bring . He called back and talked to [place]; stated he has called the ambulance. And will call us back when he arrives upstairs.

## 2016-08-17 NOTE — ED Notes (Addendum)
PT taken a drink. PT has no requests at this time. PT continues to report no chest pain. PT aware that we are waiting for an inpatient bed.

## 2016-08-17 NOTE — ED Notes (Signed)
PT arrives via EMS from home for central chest pain that started while he was showering. PT reports pain did not radiate. PT has CHF and reports he has put on "a lot of weight" in the last 1.5 weeks. PT reports exertional SOB. PT reports he is sleeping in a "praying position" for 1.5 weeks. PT had 1 SL nitro tablet in route and 324 ASA. PT reports no chest pain upon arrival to ED. PT has been taking home meds as prescribed.

## 2016-08-17 NOTE — Progress Notes (Signed)
ANTICOAGULATION CONSULT NOTE - Initial Consult  Pharmacy Consult for Xarelto Indication: h/o PE  Allergies  Allergen Reactions  . Bee Venom Anaphylaxis  . Honey Anaphylaxis    RAW HONEY  . Shellfish Allergy Shortness Of Breath and Swelling  . Ibuprofen Hives and Swelling  . Betadine [Povidone Iodine]     BECAUSE OF SHELLFISH ALLERGY    Patient Measurements: Height: 6\' 3"  (190.5 cm) Weight: (!) 386 lb 3.2 oz (175.2 kg) IBW/kg (Calculated) : 84.5  Vital Signs: Temp: 98.4 F (36.9 C) (01/02 2015) Temp Source: Oral (01/02 2015) BP: 135/100 (01/02 2015) Pulse Rate: 108 (01/02 1915)  Labs:  Recent Labs  08/17/16 1427  HGB 15.8  HCT 48.2  PLT 247  CREATININE 1.77*    Estimated Creatinine Clearance: 95.7 mL/min (by C-G formula based on SCr of 1.77 mg/dL (H)).   Medical History: Past Medical History:  Diagnosis Date  . Anxiety   . Arthritis    left knee  . Asthma    no inhaler use in 1 year  . Factor V Leiden ([place])   . GERD (gastroesophageal reflux disease)   . History of pulmonary embolus (PE)    takes Xarelto  . History of seizures    no known cause, per pt. - has been "years" since last seizure; no longer on anticonvulsant  . HLD (hyperlipidemia)   . Hypertension    states BP "runs high"; has been on med. "a while"  . Lateral meniscus tear 01/2015   left knee  . Shortness of breath dyspnea   . Sleep apnea    had sleep study 10/2013:  "severe" sleep apnea, states could not afford CPAP machine    Medications:  Prescriptions Prior to Admission  Medication Sig Dispense Refill Last Dose  . albuterol (PROVENTIL HFA;VENTOLIN HFA) 108 (90 Base) MCG/ACT inhaler Inhale 2 puffs into the lungs every 6 (six) hours as needed for wheezing or shortness of breath. 6.7 g 3 08/17/2016 at Unknown time  . furosemide (LASIX) 20 MG tablet Take 1 tablet (20 mg total) by mouth daily. 90 tablet 3 08/17/2016 at Unknown time  . hydrALAZINE (APRESOLINE) 25 MG tablet Take 1 tablet (25 mg  total) by mouth 3 (three) times daily. 270 tablet 3 08/17/2016 at Unknown time  . isosorbide mononitrate (IMDUR) 30 MG 24 hr tablet TAKE 1 Tablet BY MOUTH ONCE DAILY 90 tablet 3 08/17/2016 at Unknown time  . lisinopril (PRINIVIL,ZESTRIL) 40 MG tablet Take 1 tablet (40 mg total) by mouth daily. 90 tablet 3 08/17/2016 at Unknown time  . mometasone-formoterol (DULERA) 200-5 MCG/ACT AERO Inhale 2 puffs into the lungs 2 (two) times daily. 8.8 g 3 08/17/2016 at Unknown time  . rivaroxaban (XARELTO) 20 MG TABS tablet Take 1 tablet (20 mg total) by mouth daily with supper. 90 tablet 3 08/16/2016 at 1800  . venlafaxine XR (EFFEXOR-XR) 75 MG 24 hr capsule Take 75 mg by mouth daily with breakfast.   08/17/2016 at Unknown time  . amoxicillin-clavulanate (AUGMENTIN) 875-125 MG tablet Take 1 tablet by mouth 2 (two) times daily. (Patient not taking: Reported on 08/17/2016) 14 tablet 0 Completed Course at Unknown time  . furosemide (LASIX) 20 MG tablet TAKE ONE TABLET BY MOUTH ONCE DAILY (Patient not taking: Reported on 08/17/2016) 90 tablet 3 Not Taking at Unknown time   Scheduled:  . furosemide  80 mg Intravenous Once  . [START ON 08/18/2016] isosorbide mononitrate  30 mg Oral Daily  . LORazepam  0-4 mg Intravenous Q6H  Followed by  . [START ON 08/19/2016] LORazepam  0-4 mg Intravenous Q12H  . mometasone-formoterol  2 puff Inhalation BID  . pantoprazole  40 mg Oral Daily  . rivaroxaban  20 mg Oral Q supper  . sodium chloride flush  3 mL Intravenous Q12H  . [START ON 08/18/2016] venlafaxine XR  75 mg Oral Q breakfast    Assessment: 41yo male c/o SOB and CP, concern for acute CHF, to continue Xarelto for factor V leiden and h/o PE.   Plan:  Will continue Xarelto 20mg  daily.  Wynona Neat, PharmD, BCPS  08/17/2016,11:15 PM

## 2016-08-17 NOTE — ED Notes (Signed)
PT reports no chest pain at this time. PT reports headache 8/10

## 2016-08-17 NOTE — Telephone Encounter (Signed)
Called pt to see if at home - no answer. No note EPIC on his arrival to ED.

## 2016-08-17 NOTE — Telephone Encounter (Signed)
Pt is currently in the ED.

## 2016-08-17 NOTE — H&P (Signed)
Date: 08/17/2016               Patient Name:  Donald Brady MRN: [id number]  DOB: 10-31-1975 Age / Sex: 41 y.o., male   PCP: Florinda Marker, MD         Medical Service: Internal Medicine Teaching Service         Attending Physician: Dr. Aldine Contes, MD    First Contact: Dr. Hetty Ely Pager: [phone]  Second Contact: Dr. Juleen China Pager: [phone]       After Hours (After 5p/  First Contact Pager: [phone]  weekends / holidays): Second Contact Pager: [phone]   Chief Complaint: shortness of breath  History of Present Illness:  Donald Brady is a 41yo male with PMH of NICM with chronic systolic congestive heart failure (EF 35-40%), h/o bil PE on chronic anticoagulation, Factor V Leiden Heterogenous, HTN, CKD stage 3, GERD< MDD, Asthma, h/o polysubstance abuse presenting to the ED for weight gain and shortness of breath.   Patient states that he first noticed bilateral leg swelling on Christmas Eve and has been progressive since then, associated with increasing dyspnea, intolerance to activity, orthopnea, and productive cough (increased cough and sputum production from baseline). Productive cough began about 3 months ago and was associated with dark blood at first, then bright red streaks, and in the last 2-3 days, patient has not had further hemoptysis. He endorses compliance with Imdur, lisinopril, furosemide 80mg  daily; states he was taken off of hydralazine months ago. He endorses compliance with Xarelto. Patient endorses one episode of chest pain which occurred today while in the shower before arrival to the ED. He states that it was substernal, pressure-like, and non radiating. Patient was given 324mg  ASA and sl nitro which relieved the pain; he has had no recurrences.   Patient states he has constant nausea; he states that he has early satiety, and vomits his food about 5mins after eating. He endorses sour taste in his mouth when lying down; he denies acid reflux symptoms, abdominal pain or  dysphagia. He has 1-2 soft bowel movements per day without melena or hematochezia. He has night sweats x 76yr; denies fevers, chills, or weight loss. He endorses about a one month history of decreased vision at night.  Patient endorses drinking 24 beers per day in addition to some liquor and wine. He states he usually eats baked or smoked meat, salad with oil/vinegar, oatmeal.   Meds:  Current Meds  Medication Sig  . albuterol (PROVENTIL HFA;VENTOLIN HFA) 108 (90 Base) MCG/ACT inhaler Inhale 2 puffs into the lungs every 6 (six) hours as needed for wheezing or shortness of breath.  . furosemide (LASIX) 20 MG tablet Take 1 tablet (20 mg total) by mouth daily.  . hydrALAZINE (APRESOLINE) 25 MG tablet Take 1 tablet (25 mg total) by mouth 3 (three) times daily.  . isosorbide mononitrate (IMDUR) 30 MG 24 hr tablet TAKE 1 Tablet BY MOUTH ONCE DAILY  . lisinopril (PRINIVIL,ZESTRIL) 40 MG tablet Take 1 tablet (40 mg total) by mouth daily.  . mometasone-formoterol (DULERA) 200-5 MCG/ACT AERO Inhale 2 puffs into the lungs 2 (two) times daily.  . rivaroxaban (XARELTO) 20 MG TABS tablet Take 1 tablet (20 mg total) by mouth daily with supper.  . venlafaxine XR (EFFEXOR-XR) 75 MG 24 hr capsule Take 75 mg by mouth daily with breakfast.   Allergies: Allergies as of 08/17/2016 - Review Complete 08/17/2016  Allergen Reaction Noted  . Bee venom Anaphylaxis 11/27/2011  .  Honey Anaphylaxis 11/27/2011  . Shellfish allergy Shortness Of Breath and Swelling 11/27/2011  . Ibuprofen Hives and Swelling 11/27/2011  . Betadine [povidone iodine]  01/20/2015   Past Medical History:  Diagnosis Date  . Anxiety   . Arthritis    left knee  . Asthma    no inhaler use in 1 year  . Factor V Leiden ([place])   . GERD (gastroesophageal reflux disease)   . History of pulmonary embolus (PE)    takes Xarelto  . History of seizures    no known cause, per pt. - has been "years" since last seizure; no longer on anticonvulsant  .  HLD (hyperlipidemia)   . Hypertension    states BP "runs high"; has been on med. "a while"  . Lateral meniscus tear 01/2015   left knee  . Shortness of breath dyspnea   . Sleep apnea    had sleep study 10/2013:  "severe" sleep apnea, states could not afford CPAP machine    Family History: Mother with DM, HTN. Father with Clotting disorder (Factor V?), heart disease  Social History: Drinks at least 24 beers per day + liquor/wine x 79yrs. Smokes 2ppd (last use last week due to exacerbating cough). Smokes marijuana (last use last week due to exacerbating cough). Has h/o of cocaine use - states last use was about 6 months ago.   Review of Systems: A complete ROS was negative except as per HPI.   Physical Exam: Blood pressure 128/82, pulse 108, temperature 98.4 F (36.9 C), temperature source Oral, resp. rate 26, height 6\' 3"  (1.905 m), weight (!) 380 lb (172.4 kg), SpO2 98 %. General: alert, obese, and cooperative to examination.  Head: normocephalic and atraumatic.  Eyes: vision grossly intact, pupils equal, pupils round, pupils reactive to light, no injection and anicteric.  Mouth: pharynx pink and moist, no erythema, and no exudates.  Neck: supple, full ROM, no thyromegaly, no JVD.  Lungs: normal respiratory effort, no accessory muscle use, diminished breath sounds 2/2 body habitus, mild bibasilar crackles Heart: normal rate, regular rhythm, no murmur, no gallop, and no rub.  Abdomen: soft, non-tender, normal bowel sounds, no distention, no guarding, no rebound tenderness, no hepatomegaly, and no splenomegaly. Small reducible, non tender umbilical hernia Msk: no joint swelling, no joint warmth, and no redness over joints.  Pulses: 2+ DP/PT pulses bilaterally Extremities: No cyanosis, clubbing; 2+ pitting edema bil LE to knees Neurologic: alert & oriented X3, cranial nerves II-XII intact, strength normal in all extremities, sensation intact to light touch  Skin: turgor normal and no  rashes.  Psych: Oriented X3, memory intact for recent and remote, normally interactive, good eye contact, not anxious appearing, and not depressed appearin  LABS: Na 141, K 3.7, CL 108, CO2 25, BUN 10, Cr 1.77, Glu 94 WBC 6.8, Hgb 15.8, Hct 48.2, Plts 247 BNP 800 I stat trop negative  EKG: Personally reviewed - Sinus tachycardia, left atrial enlargement, no acute ST or T wave changes  CXR: Personally reviewed - Interstitial edema  Echo 02/2016: LVEF 35-40% with diffuse hypokinesis  LHC 02/2015: NICM  Assessment & Plan by Problem: Active Problems:   Acute exacerbation of CHF (congestive heart failure) (HCC)  Acute on chronic systolic CHF, NICM: Patient with h/o systolic CHF with last LVED 35-40% in July 2017. CXR, symptoms and exam consistent with fluid overload 2/2 CHF exacerbation likely precipitated by continued alcohol abuse.  --IV lasix 80mg  BID --strict ins and outs; daily weights --Imdur 30mg  daily --Holding lisinopril  in setting of slight increase in Cr --consider restarting hydralazine; no indication in chart review that this was discontinued --ECHO  Alcohol dependence: Patient with [id number] alcoholic drinks per day. He is uncertain whether he wants to complete detox at this time.  --CIWA protocol  --Librium --supplement folate and thiamine  GERD: Patient with history of GERD not currently treated. He also endorses symptoms suggestive of acid reflux and gastritis. --start Protonix 40mg  daily --Zofran PRN for nausea  H/o bil PE on chronic anticoagulation, Factor V Leiden heterogenous: Patient maintains compliance with Xarelto therapy.  --continue Xarelto 20mg  daily  HTN: Patient on home regimen of lisinopril 40mg  daily and Imdur 30mg  daily.  --continue imdur; hold lisinopril at this time due to slight increase in Cr but can restart at discharge. --consider restarting hydralazine as above  MDD: Patient continued on his home regimen of venlafaxine 75mg   daily  Asthma: Patient continued on home albuterol and dulera  DVT: Xarelto IVF: none Diet: HH with fluid restriction Code: FULL  Dispo: Admit patient to Inpatient with expected length of stay greater than 2 midnights.  Signed: Alphonzo Grieve, MD 08/17/2016, 7:35 PM  Pager [phone]

## 2016-08-17 NOTE — Telephone Encounter (Signed)
APT. REMINDER CALL, NO ANSWER, NO VOICEMAIL °

## 2016-08-17 NOTE — ED Notes (Signed)
Care handoff to Bad[place], RN and Herbert Spires, Therapist, sports

## 2016-08-17 NOTE — ED Provider Notes (Signed)
[facility] DEPT Provider Note   CSN: [id number] Arrival date & time: 08/17/16  1415     History   Chief Complaint Chief Complaint  Patient presents with  . Chest Pain    HPI Donald Brady is a 41 y.o. male.  HPI  41 year old male who presents with shortness of breath and chest pain. History of OSA, factor V leiden deficiency w/ history of PE on Xarelto, CKD, and chronic systolic HF w/ EF 123456. States 1-1.5 weeks of gradual LE edema and weight gait. He states his dry weight is about 375lbs, today is 380lb. Has had progressively worsening DOE to dyspnea with minimal daily activity. A/w PND and worsening orthopnea. Has had intermittent sharp twinges of chest pain with cough, but not associated with deep inspiration. No fever or chills. This afternoon at 12PM while showering, developed severe chest pressure that did not go away. Called EMS and given 1 SL nitro and full dose ASA. Pain resolved while here in the ED. No nausea, vomiting, syncope, abdominal pain, severe back pain.  Past Medical History:  Diagnosis Date  . Anxiety   . Arthritis    left knee  . Asthma    no inhaler use in 1 year  . Factor V Leiden ([place])   . GERD (gastroesophageal reflux disease)   . History of pulmonary embolus (PE)    takes Xarelto  . History of seizures    no known cause, per pt. - has been "years" since last seizure; no longer on anticonvulsant  . HLD (hyperlipidemia)   . Hypertension    states BP "runs high"; has been on med. "a while"  . Lateral meniscus tear 01/2015   left knee  . Shortness of breath dyspnea   . Sleep apnea    had sleep study 10/2013:  "severe" sleep apnea, states could not afford CPAP machine    Patient Active Problem List   Diagnosis Date Noted  . Dental abscess 06/01/2016  . Morbid obesity due to excess calories ([place])   . Presbyopia 01/26/2016  . Alcohol abuse 01/26/2016  . Hypertensive heart disease 10/01/2015  . Chronic systolic CHF (congestive heart failure)  ([place]) 10/01/2015  . Umbilical hernia without obstruction and without gangrene 08/29/2015  . Pre-diabetes 08/29/2015  . CKD (chronic kidney disease), stage III 08/19/2015  . Nonobstructive cardiomyopathy ([place]) 05/05/2015  . Cocaine use 12/23/2014  . Depression 10/10/2014  . GERD (gastroesophageal reflux disease) 07/24/2014  . Polycythemia, secondary 05/23/2014  . Factor 5 Leiden mutation, heterozygous ([place]) 05/22/2014  . Hyperlipidemia 04/23/2014  . Sinusitis, chronic 04/23/2014  . Severe obstructive sleep apnea 11/20/2013  . Essential hypertension, benign 08/04/2013  . Asthma 02/20/2013  . Osteoarthritis of left knee 06/28/2012  . Long term current use of anticoagulant therapy 12/02/2011  . Bilateral pulmonary embolism ([place]) 11/27/2011  . Tobacco abuse 11/27/2011    Past Surgical History:  Procedure Laterality Date  . CARDIAC CATHETERIZATION N/A 02/19/2015   Procedure: Left Heart Cath and Coronary Angiography;  Surgeon: Peter M Martinique, MD;  Location: [facility];  Service: Cardiovascular;  Laterality: N/A;  . CHONDROPLASTY Left 03/26/2015   Procedure: CHONDROPLASTY;  Surgeon: Leandrew Koyanagi, MD;  Location: [facility];  Service: Orthopedics;  Laterality: Left;  . ESOPHAGOGASTRODUODENOSCOPY (EGD) WITH PROPOFOL  07/10/2014  . HAND SURGERY Right    states knuckle removed  . KNEE ARTHROSCOPY WITH LATERAL MENISECTOMY Left 03/26/2015   Procedure: LEFT KNEE ARTHROSCOPY WITH LATERAL MENISECTOMY  CHONDROPLASTY;  Surgeon:  Leandrew Koyanagi, MD;  Location: [facility];  Service: Orthopedics;  Laterality: Left;  . ORIF FEMUR FRACTURE Left   . VIDEO BRONCHOSCOPY Bilateral 12/06/2012   Procedure: VIDEO BRONCHOSCOPY WITHOUT FLUORO;  Surgeon: Juanito Doom, MD;  Location: [facility];  Service: Cardiopulmonary;  Laterality: Bilateral;       Home Medications    Prior to Admission medications   Medication Sig Start Date End Date Taking? Authorizing Provider    albuterol (PROVENTIL HFA;VENTOLIN HFA) 108 (90 Base) MCG/ACT inhaler Inhale 2 puffs into the lungs every 6 (six) hours as needed for wheezing or shortness of breath. 07/05/16  Yes Alexa Angela Burke, MD  furosemide (LASIX) 20 MG tablet Take 1 tablet (20 mg total) by mouth daily. 06/28/16  Yes Alexa Angela Burke, MD  hydrALAZINE (APRESOLINE) 25 MG tablet Take 1 tablet (25 mg total) by mouth 3 (three) times daily. 04/21/16  Yes Alexa Angela Burke, MD  isosorbide mononitrate (IMDUR) 30 MG 24 hr tablet TAKE 1 Tablet BY MOUTH ONCE DAILY 08/03/16  Yes Alexa Angela Burke, MD  lisinopril (PRINIVIL,ZESTRIL) 40 MG tablet Take 1 tablet (40 mg total) by mouth daily. 03/18/16  Yes Alexa Angela Burke, MD  mometasone-formoterol (DULERA) 200-5 MCG/ACT AERO Inhale 2 puffs into the lungs 2 (two) times daily. 07/05/16  Yes Alexa Angela Burke, MD  rivaroxaban (XARELTO) 20 MG TABS tablet Take 1 tablet (20 mg total) by mouth daily with supper. 03/18/16  Yes Alexa Angela Burke, MD  venlafaxine XR (EFFEXOR-XR) 75 MG 24 hr capsule Take 75 mg by mouth daily with breakfast.   Yes Historical Provider, MD  amoxicillin-clavulanate (AUGMENTIN) 875-125 MG tablet Take 1 tablet by mouth 2 (two) times daily. Patient not taking: Reported on 08/17/2016 06/01/16   Zada Finders, MD  furosemide (LASIX) 20 MG tablet TAKE ONE TABLET BY MOUTH ONCE DAILY Patient not taking: Reported on 08/17/2016 07/06/16   Florinda Marker, MD    Family History Family History  Problem Relation Age of Onset  . Diabetes Mother   . Hypertension Mother   . Hearing loss Father     died in his 102's  . Cancer Father     unknown type  . Clotting disorder Father   . Heart disease Father   . Diabetes Maternal Aunt   . Hypertension Maternal Aunt   . Diabetes Maternal Uncle   . Hypertension Maternal Uncle   . Stomach cancer Maternal Uncle   . Lung cancer Paternal Barbaraann Rondo     was a smoker  . Breast cancer Maternal Aunt   . Stomach cancer Maternal Uncle   . Liver disease Maternal Uncle     drinker  .  Kidney disease Maternal Aunt     Social History Social History  Substance Use Topics  . Smoking status: Current Every Day Smoker    Packs/day: 1.00    Years: 21.00    Types: Cigarettes  . Smokeless tobacco: Never Used     Comment: Stress.None in last couple days  . Alcohol use 0.0 oz/week     Comment: weekends     Allergies   Bee venom; Honey; Shellfish allergy; Ibuprofen; and Betadine [povidone iodine]   Review of Systems Review of Systems Physical Exam  Nursing note and vitals reviewed. Constitutional: non-toxic, and in no acute distress Head: Normocephalic and atraumatic.  Mouth/Throat: Oropharynx is clear and moist.  Neck: Normal range of motion. Neck supple.  Cardiovascular: Tachycardic rate and regular rhythm.   Pulmonary/Chest: Mild tachypnea, no  accessory muscle usage, diminished breath sounds due to body habitus  Abdominal: Soft. There is no tenderness. There is no rebound and no guarding.  Musculoskeletal: +1 pitting edema bilaterally Neurological: Alert, no facial droop, fluent speech, moves all extremities symmetrically Skin: Skin is warm and dry.  Psychiatric: Cooperative   Physical Exam Updated Vital Signs BP 138/97   Pulse 107   Temp 98.4 F (36.9 C) (Oral)   Resp 21   Ht 6\' 3"  (1.905 m)   Wt (!) 380 lb (172.4 kg)   SpO2 99%   BMI 47.50 kg/m   Physical Exam   ED Treatments / Results  Labs (all labs ordered are listed, but only abnormal results are displayed) Labs Reviewed  BASIC METABOLIC PANEL - Abnormal; Notable for the following:       Result Value   Creatinine, Ser 1.77 (*)    Calcium 8.7 (*)    GFR calc non Af Amer 47 (*)    GFR calc Af Amer 54 (*)    All other components within normal limits  CBC  DIFFERENTIAL  BRAIN NATRIURETIC PEPTIDE  I-STAT TROPOININ, ED    EKG  EKG Interpretation  Date/Time:  Tuesday August 17 2016 14:17:32 EST Ventricular Rate:  105 PR Interval:    QRS Duration: 86 QT Interval:  370 QTC  Calculation: 489 R Axis:   -30 Text Interpretation:  Sinus tachycardia Left atrial enlargement Left axis deviation Abnormal R-wave progression, late transition Borderline prolonged QT interval Baseline wander in lead(s) V2 Confirmed by [facility] MD, JULIE ([id number]) on 08/17/2016 2:25:03 PM Also confirmed by [facility] MD, JULIE ([id number]), editor [place] [facility], Leda Gauze [phone])  on 08/17/2016 2:34:42 PM       Radiology Dg Chest 2 View  Result Date: 08/17/2016 CLINICAL DATA:  Chest pain and shortness of breath for the past week. One month of cough. History of CHF and asthma. Current smoker. EXAM: CHEST  2 VIEW COMPARISON:  PA and lateral chest study of June 02, 2016 FINDINGS: The lungs are well-expanded. The interstitial markings are diffusely increased and are more conspicuous overall today. There is no alveolar infiltrate. There is no pleural effusion. The cardiac silhouette is enlarged. The central pulmonary vascularity is engorged and less distinct. The trachea is midline. The bony thorax exhibits no acute abnormality. IMPRESSION: Mild CHF with interstitial edema more conspicuous than in the past. No alveolar pneumonia. Electronically Signed   By: David  Martinique M.D.   On: 08/17/2016 15:23    Procedures Procedures (including critical care time)  Medications Ordered in ED Medications  LORazepam (ATIVAN) injection 0-4 mg (1 mg Intravenous Given 08/17/16 1700)    Followed by  LORazepam (ATIVAN) injection 0-4 mg (not administered)  furosemide (LASIX) injection 40 mg (40 mg Intravenous Given 08/17/16 1526)     Initial Impression / Assessment and Plan / ED Course  I have reviewed the triage vital signs and the nursing notes.  Pertinent labs & imaging results that were available during my care of the patient were reviewed by me and considered in my medical decision making (see chart for details).  Clinical Course     Presents with one week of progressively worsening dyspnea with activity, lower extremity  edema, orthopnea and PND. Presentation concerning for an acute CHF exacerbation. At rest is nontoxic and in no acute distress, no conversational dyspnea and saturating normally on room air. Does look fluid overloaded on exam. 5 lbs up from dry weight today. Chest x-ray visualized and does show some increased  interstitial edema. Chest pain-free here in the emergency department, and EKG is not acutely ischemic. Troponin at 0.06.  received aspirin by EMS. Initially given 40 mg iv lasix with good diuresis. Will admit to internal medicine service for additional diuresis.   Final Clinical Impressions(s) / ED Diagnoses   Final diagnoses:  Acute on chronic systolic congestive heart failure [facility])    New Prescriptions New Prescriptions   No medications on file     Forde Dandy, MD 08/17/16 1758

## 2016-08-18 ENCOUNTER — Ambulatory Visit: Payer: Self-pay

## 2016-08-18 ENCOUNTER — Inpatient Hospital Stay (HOSPITAL_COMMUNITY): Payer: Self-pay

## 2016-08-18 DIAGNOSIS — R000 Tachycardia, unspecified: Secondary | ICD-10-CM

## 2016-08-18 DIAGNOSIS — I509 Heart failure, unspecified: Secondary | ICD-10-CM

## 2016-08-18 DIAGNOSIS — IMO0002 Reserved for concepts with insufficient information to code with codable children: Secondary | ICD-10-CM

## 2016-08-18 DIAGNOSIS — E785 Hyperlipidemia, unspecified: Secondary | ICD-10-CM

## 2016-08-18 DIAGNOSIS — F10239 Alcohol dependence with withdrawal, unspecified: Secondary | ICD-10-CM

## 2016-08-18 LAB — RAPID URINE DRUG SCREEN, HOSP PERFORMED
Amphetamines: NOT DETECTED
BENZODIAZEPINES: NOT DETECTED
Barbiturates: NOT DETECTED
Cocaine: NOT DETECTED
Opiates: NOT DETECTED
Tetrahydrocannabinol: POSITIVE — AB

## 2016-08-18 LAB — ECHOCARDIOGRAM COMPLETE
HEIGHTINCHES: 75 in
WEIGHTICAEL: 6214.4 [oz_av]

## 2016-08-18 LAB — LIPID PANEL
CHOL/HDL RATIO: 4.1 ratio
CHOLESTEROL: 177 mg/dL (ref 0–200)
HDL: 43 mg/dL (ref >40)
LDL Cholesterol: 102 mg/dL — ABNORMAL HIGH (ref 0–99)
TRIGLYCERIDES: 160 mg/dL — AB (ref <150)
VLDL: 32 mg/dL (ref 0–40)

## 2016-08-18 LAB — BASIC METABOLIC PANEL
ANION GAP: 5 (ref 5–15)
BUN: 12 mg/dL (ref 6–20)
CALCIUM: 8.2 mg/dL — AB (ref 8.9–10.3)
CO2: 29 mmol/L (ref 22–32)
Chloride: 104 mmol/L (ref 101–111)
Creatinine, Ser: 1.92 mg/dL — ABNORMAL HIGH (ref 0.61–1.24)
GFR calc non Af Amer: 42 mL/min — ABNORMAL LOW (ref >60)
GFR, EST AFRICAN AMERICAN: 49 mL/min — AB (ref >60)
Glucose, Bld: 108 mg/dL — ABNORMAL HIGH (ref 65–99)
Potassium: 3.7 mmol/L (ref 3.5–5.1)
SODIUM: 138 mmol/L (ref 135–145)

## 2016-08-18 MED ORDER — CHLORDIAZEPOXIDE HCL 25 MG PO CAPS: 25.0000 mg | ORAL_CAPSULE | ORAL | Status: DC

## 2016-08-18 MED ORDER — HYDROXYZINE HCL 25 MG PO TABS
25.0000 mg | ORAL_TABLET | Freq: Four times a day (QID) | ORAL | Status: DC | PRN
  Administered 2016-08-19 (×2): 25 mg via ORAL
  Filled 2016-08-18 (×2): qty 1

## 2016-08-18 MED ORDER — CHLORDIAZEPOXIDE HCL 25 MG PO CAPS
25.0000 mg | ORAL_CAPSULE | Freq: Three times a day (TID) | ORAL | Status: DC
  Administered 2016-08-19 – 2016-08-20 (×2): 25 mg via ORAL
  Filled 2016-08-18 (×2): qty 1

## 2016-08-18 MED ORDER — PERFLUTREN LIPID MICROSPHERE
INTRAVENOUS | Status: AC
  Administered 2016-08-18: 2 mL
  Filled 2016-08-18: qty 10

## 2016-08-18 MED ORDER — FUROSEMIDE 10 MG/ML IJ SOLN
80.0000 mg | Freq: Two times a day (BID) | INTRAMUSCULAR | Status: DC
  Administered 2016-08-18 – 2016-08-20 (×5): 80 mg via INTRAVENOUS
  Filled 2016-08-18 (×5): qty 8

## 2016-08-18 MED ORDER — CHLORDIAZEPOXIDE HCL 25 MG PO CAPS
25.0000 mg | ORAL_CAPSULE | Freq: Four times a day (QID) | ORAL | Status: AC
  Administered 2016-08-18 – 2016-08-19 (×4): 25 mg via ORAL
  Filled 2016-08-18 (×4): qty 1

## 2016-08-18 MED ORDER — CHLORDIAZEPOXIDE HCL 25 MG PO CAPS: 25.0000 mg | ORAL_CAPSULE | Freq: Every day | ORAL | Status: DC

## 2016-08-18 MED ORDER — ATORVASTATIN CALCIUM 40 MG PO TABS
40.0000 mg | ORAL_TABLET | Freq: Every day | ORAL | Status: DC
  Administered 2016-08-18 – 2016-08-19 (×2): 40 mg via ORAL
  Filled 2016-08-18 (×2): qty 1

## 2016-08-18 MED ORDER — PERFLUTREN LIPID MICROSPHERE
1.0000 mL | INTRAVENOUS | Status: AC | PRN
  Filled 2016-08-18: qty 10

## 2016-08-18 MED ORDER — CHLORDIAZEPOXIDE HCL 25 MG PO CAPS: 25.0000 mg | ORAL_CAPSULE | Freq: Four times a day (QID) | ORAL | Status: DC | PRN

## 2016-08-18 NOTE — Progress Notes (Addendum)
Subjective: [name] reports good urine output overnight. He denies anxiety or palpitations. He reports medication compliance and denies symptoms of URI or other infection.   Objective:  Vital signs in last 24 hours: Vitals:   08/18/16 0108 08/18/16 0447 08/18/16 0852 08/18/16 1100  BP: 140/90 131/89  124/80  Pulse: (!) 106 (!) 106  (!) 109  Resp: 20 20  20   Temp: 98.5 F (36.9 C) 98.7 F (37.1 C)  98.4 F (36.9 C)  TempSrc: Oral Oral  Oral  SpO2: 96% 95% 93% 92%  Weight:  (!) 388 lb 6.4 oz (176.2 kg)    Height:       Physical Exam  Constitutional: He is oriented to person, place, and time. He appears well-developed and well-nourished. No distress.  HENT:  Head: Normocephalic and atraumatic.  Eyes: Conjunctivae are normal. No scleral icterus.  Cardiovascular: Normal rate and regular rhythm.   No murmur heard. B/l 2+ pitting edema to the knees   Pulmonary/Chest: Effort normal and breath sounds normal. No respiratory distress. He has no wheezes. He has no rales.  Abdominal: Soft. Bowel sounds are normal. He exhibits distension. There is no tenderness. There is no guarding.  Neurological: He is alert and oriented to person, place, and time.  Skin: Skin is warm and dry. He is not diaphoretic.   Assessment/Plan: Pt is a 41 y.o. 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     Acute exacerbation of CHF (congestive heart failure) (HCC)   Acute on chronic systolic congestive heart failure ([place]) On presentation he weighed 388 lbs up from dry weight 375 lbs and BNP 800. Received a total of IV lasix 120 mg and has had 3 liters of urine output overnight. Echo today showed EF 20%, diffuse hypokinesia,  grade 2 diastolic dysfunction and pulmonary artery pressure 33 mm Hg. Prior echo 02/2016 EF 35-40% with diffuse hypokinesia and cath at that time showed non obstructive coronary artery disease in the LAD and RCA. Weight on admission 172 kg.  - will consult heart failure tomorrow - continue IV lasix 80 mg BID  - continue daily weight, strict I&Os  - continue home medications imdur, holding home med lisinopril in the setting of AKI.   Chest pain  On admission reported a short episode of substernal chest pressure which was relieved with ASA and nitro. On admission trop 0.06 and EKG showed flattened T waves which were consistent with prior EKG. He states that he has had no further chest pain since admission. HEART score 4 correlates with moderate risk for ACS.  -follow up repeat EKG    Alcohol use disorder (HCC) Hx of heavy drinking (24 beers per day) states his last drink was yesterday morning and he is having cravings for a drink. Denies anxiety or palpitations and has no tremor or tongue fasciculations on exam. He is tachycardic.  -continue CIWA monitoring  - ordered librium taper  -continue thiamin   Acute on CKD (chronic kidney disease), stage III Crt 1.9 on admission with baseline 1.5. Most likely cardiorenal related to decreased perfusion in the setting of his CHF exacerbation. -follow up morning BMP     Hypertension Current currently normotensive -Continue home medication Imdur  30 mg daily  Hyperlipidemia  LDL 120. ASCVD score 8.3, he would benefit from moderate-high intensity statin therapy.  - ordered atorvastatin 40 mg daily   GERD  - Continue Protonix 40 mg daily and Zofran PRN  Hx of PE  - continue home medication Xarelto 20 mg qd   MDD  -continue home medication venlafaxine 75 mg qd   Dispo: Anticipated discharge in approximately 2-4 day(s).   Ledell Noss, MD 08/18/2016, 4:21 PM Pager: [phone]

## 2016-08-18 NOTE — Progress Notes (Signed)
  Echocardiogram 2D Echocardiogram with Definity has been performed.  Donald Brady 08/18/2016, 9:04 AM

## 2016-08-18 NOTE — Progress Notes (Signed)
Patient states he slept the best since arriving on [facility] that he has in quite a while and was able to sleep in the bed which he has not been able to do due to shortness of breath.  Patient becomes noticeably short of breath with any exertion.  Ativan 1 mg IV administered x 2 during 7 p to 7 a shift, patient states he does want a drink.  Says he knows he should quit drinking.

## 2016-08-18 NOTE — Progress Notes (Signed)
Patient arrived on unit from ED in no acute distress.  Patient does relate that he has a headache and needs something for itch due to his legs itching.  Notified internal medicine providers who stated they plan to come up on unit to see patient soon.

## 2016-08-18 NOTE — Care Management Note (Signed)
Case Management Note  Patient Details  Name: Donald Brady MRN: [id number] Date of Birth: 10-19-1975  Subjective/Objective:      Admitted with CHF              Action/Plan: CM talked to patient at length about taking better care of himself and making wise choices. No PCP, no medical insurance; at discharge patient can follow up at the [facility] and can get his prescriptions filled there also. Sarah Soc Worker to give patient resources on substance abuse.  Expected Discharge Date:     Possibly 08/21/2016             Expected Discharge Plan:  Home/Self Care  In-House Referral:   Development worker, community, SW  Discharge planning Services  CM Consult   Status of Service:  In process, will continue to follow  Sherrilyn Rist [id number] 08/18/2016, 12:40 PM

## 2016-08-18 NOTE — Progress Notes (Signed)
Pt has had a fall last Wednesday. Bed alarm placed on patient. Will continue to monitor.

## 2016-08-18 NOTE — Progress Notes (Signed)
Patient called out to nurses station in distress with complaint of shortness of breath. Upon arrival patient had labored breathing and little to no air movement upon ascultation. Patient's oxygen saturation was 82% on room air. Patient now up to 94% on 2 liters nasal cannula after coaching and deep breathing. Patient states he was sound asleep and has untreated sleep apnea. Patient states he sleeps on his knees at home so he can breath. Respiratory called for breathing treatment.

## 2016-08-19 DIAGNOSIS — F1411 Cocaine abuse, in remission: Secondary | ICD-10-CM

## 2016-08-19 DIAGNOSIS — G4733 Obstructive sleep apnea (adult) (pediatric): Secondary | ICD-10-CM

## 2016-08-19 LAB — BASIC METABOLIC PANEL
Anion gap: 7 (ref 5–15)
BUN: 15 mg/dL (ref 6–20)
CALCIUM: 8.5 mg/dL — AB (ref 8.9–10.3)
CO2: 28 mmol/L (ref 22–32)
CREATININE: 1.94 mg/dL — AB (ref 0.61–1.24)
Chloride: 104 mmol/L (ref 101–111)
GFR calc Af Amer: 48 mL/min — ABNORMAL LOW (ref >60)
GFR, EST NON AFRICAN AMERICAN: 42 mL/min — AB (ref >60)
Glucose, Bld: 118 mg/dL — ABNORMAL HIGH (ref 65–99)
POTASSIUM: 3.8 mmol/L (ref 3.5–5.1)
SODIUM: 139 mmol/L (ref 135–145)

## 2016-08-19 MED ORDER — VENLAFAXINE HCL ER 75 MG PO CP24
75.0000 mg | ORAL_CAPSULE | Freq: Every day | ORAL | Status: DC
  Administered 2016-08-19 – 2016-08-20 (×2): 75 mg via ORAL
  Filled 2016-08-19 (×2): qty 1

## 2016-08-19 NOTE — Clinical Social Work Note (Signed)
Clinical Social Work Assessment  Patient Details  Name: Donald Brady MRN: [id number] Date of Birth: 02-23-1976  Date of referral:  08/19/16               Reason for consult:  Substance Use/ETOH Abuse                Permission sought to share information with:    Permission granted to share information::  No  Name::        Agency::     Relationship::     Contact Information:     Housing/Transportation Living arrangements for the past 2 months:  Single Family Home Source of Information:  Patient, Medical Team Patient Interpreter Needed:  None Criminal Activity/Legal Involvement Pertinent to Current Situation/Hospitalization:  No - Comment as needed Significant Relationships:  Parents Lives with:    Do you feel safe going back to the place where you live?  Yes Need for family participation in patient care:  Yes (Comment)  Care giving concerns:  Patient in need of substance abuse resources due to regular alcohol use.   Social Worker assessment / plan:  CSW met with patient. No supports at bedside. CSW introduced role and inquired about interest in substance abuse resources. Patient agreeable to receiving resources. CSW provided outpatient treatment centers within 15 miles of his address and residential and outpatient treatment centers in [place]. Patient reports drinking 24 beers per day plus liquor. When asked what kind of liquor he consumes patient reported he will drink whatever is available. Patient reports that 24 beers per day is the largest amount that he has ever consumed but he drinks it throughout the day instead of in one sitting. Patient reports no drug use. Patient is motivated to seek treatment because of the effect his drinking is having on his health. No further concerns. CSW signing off as social work intervention is no longer needed.  Employment status:  Unemployed Forensic scientist:  Self Pay (Medicaid Pending) PT Recommendations:  Not assessed at this  time Information / Referral to community resources:  Residential Substance Abuse Treatment Options, Outpatient Substance Abuse Treatment Options  Patient/Family's Response to care:  Patient agreeable to receiving substance abuse resources. Patient's mother supportive and involved in patient's care. Patient appreciated social work intervention.  Patient/Family's Understanding of and Emotional Response to Diagnosis, Current Treatment, and Prognosis:  Patient understands reason for current hospitalization. Patient appears happy with hospital care.  Emotional Assessment Appearance:  Appears stated age Attitude/Demeanor/Rapport:  Other (Pleasant) Affect (typically observed):  Accepting, Appropriate, Calm, Pleasant Orientation:  Oriented to Self, Oriented to Place, Oriented to  Time, Oriented to Situation Alcohol / Substance use:  Alcohol Use Psych involvement (Current and /or in the community):  No (Comment)  Discharge Needs  Concerns to be addressed:  Substance Abuse Concerns Readmission within the last 30 days:  No Current discharge risk:  Substance Abuse Barriers to Discharge:  Continued Medical Work up   Candie Chroman, LCSW 08/19/2016, 11:32 AM

## 2016-08-19 NOTE — Progress Notes (Signed)
Subjective: [name] reports good urine output overnight. He reports anxiety and cravings to drink this morning when seen on rounds. He feels that his head is racing with the thought to drink and his skin feels itchy but he does not report visual or auditory hallucinations. He says that the librium has been helping these symptoms significantly and kept him from wanting to leave the hospital to have a drink. He is due for a dose of librium when seen during rounds this morning.   Objective:  Vital signs in last 24 hours: Vitals:   08/18/16 2305 08/18/16 2325 08/19/16 0410 08/19/16 1111  BP:  (!) 151/94 (!) 137/95 (!) 145/107  Pulse: (!) 110 (!) 110 (!) 103 (!) 102  Resp: 20 (!) 22 20 20   Temp:  98.2 F (36.8 C) 97.9 F (36.6 C) 97.6 F (36.4 C)  TempSrc:  Oral Oral Oral  SpO2: 98% 90% 92% 95%  Weight:   (!) 387 lb 1.6 oz (175.6 kg)   Height:       Physical Exam  Constitutional: He is oriented to person, place, and time. He appears well-developed and well-nourished. No distress.  HENT:  Head: Normocephalic and atraumatic.  Eyes: Conjunctivae are normal. No scleral icterus.  Cardiovascular: Normal rate and regular rhythm.   No murmur heard. B/l 2+ pitting edema to the knees, improving  Pulmonary/Chest: Effort normal and breath sounds normal. No respiratory distress. He has no wheezes. He has no rales.  Abdominal: Soft. Bowel sounds are normal. He exhibits distension. There is no tenderness. There is no guarding.  Neurological: He is alert and oriented to person, place, and time.  Skin: Skin is warm and dry. He is not diaphoretic.   Assessment/Plan: Pt is a 41 y.o. 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     Acute exacerbation of CHF (congestive heart failure) (HCC)   Acute on chronic systolic congestive heart failure (HCC) Weight 175 kg today stable from 175 on admission up from dry weight 170 kg. He diuresed 5 liters overnight on IV lasix 80 mg BID.  He had worsening ECHO EF 20% yesterday from 02/2016 EF 35-40% but this was in the setting of heart failure exacerbation. States that his breathing feels improved since admission and he has been achieving good urine output. Will continue to monitor.  - continue IV lasix 80 mg BID  - continue daily weight, strict I&Os  - continue home medications imdur, holding home med lisinopril in the setting of AKI.    Alcohol use disorder (HCC) Hx of heavy drinking (24 beers per day) states his last drink was the morning of 08/17/2016 and he is having cravings for a drink. Reported anxiety when seen on admission but he was due for next dose of librium and says that the librium has been helping with his anxiety. No tremor or tongue fasciculations on exam. He remains tachycardic.  -continue CIWA monitoring  - ordered librium taper  -continue thiamin   Chest pain  On admission reported a 20 min episode of substernal chest pressure which was relieved with ASA and nitro. On admission trop 0.06 and EKG showed flattened T waves which were consistent with prior EKG. He states  that he has had no further chest pain since admission, reasurring that he does not have ACS. Repeat EKG today was stable from yesterday.   OSA  Describes sleeping on his knees at home in order to breath. Gets poor sleep at home and wakes up unrefreshed. Sleep study 10/2013 showed multiple apneic episodes but he was not set up with a mask afterward. Trial of CPAP mask overnight and he says he slept the best that he's slept in years.   - continue CPAP qHS   Acute on CKD (chronic kidney disease), stage III Crt 1.9 stable from yesterday, 1.7 on admission with baseline 1.5. Most likely  cardiorenal related to decreased perfusion in the setting of his CHF exacerbation. -continue diuresis  -follow up morning BMP     Hypertension Current hypertensive SBP 140-150 -Continue home medication Imdur 30 mg daily  Hyperlipidemia  - continue atorvastatin 40 mg daily   GERD  - Continue Protonix 40 mg daily and Zofran PRN  Hx of PE  - continue home medication Xarelto 20 mg qd   MDD  -continue home medication venlafaxine 75 mg qd   Dispo: Anticipated discharge in approximately 2-4 day(s).   Ledell Noss, MD 08/19/2016, 11:45 AM Pager: [phone]

## 2016-08-19 NOTE — Progress Notes (Signed)
PT Cancellation Note  Patient Details Name: Donald Brady MRN: [id number] DOB: Jan 09, 1976   Cancelled Treatment:    Reason Eval/Treat Not Completed: PT screened, no needs identified, will sign off. Pt and friend both report pt mobilizing without difficulty and that his fall a few weeks ago was due to stepping in a hole in the yard.   [place] 08/19/2016, 3:45 PM [place]

## 2016-08-19 NOTE — Progress Notes (Signed)
Medicine attending: I personally examined this patient today together with resident physician Dr. Ledell Noss and I attest to the accuracy of her evaluation and management plan which we discussed together and which will be detailed in her subsequent progress note. 41 year old man with known chronic alcohol abuse. Remote cocaine abuse. Likely obesity obstructive sleep apnea. 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 Follow-up echocardiogram done this admission shows ejection fraction down to 20%. Diffuse hypokinesis. Grade 2 diastolic dysfunction. Compared with a July 2017 study, further decrease in systolic function from [id number] percent. He is currently being diuresed with some improvement. He has brawny 3+ lower extremity edema to the knees right leg greater than left. Current weight 387 pounds. Increase from admission weight of 380. Currently receiving furosemide 80 mg IV twice daily. Renal function abnormal but stable with BUN 28, creatinine 1.9. We will continue current management plan. He is starting to show signs of alcohol withdrawal with restlessness, mild tachycardia, pruritus, and emotional lability. He is receiving around-the-clock Librium. When necessary Atarax. We will add when necessary lorazepam as needed. Baseline neurologic exam with no focal deficits.

## 2016-08-19 NOTE — Progress Notes (Signed)
Pt's mother very concerned about the pts need for a cpap machine at home.  Reassured pt's mother that RN would speak with CM about this home need.  Will continue to monitor.

## 2016-08-19 NOTE — Progress Notes (Signed)
Pt had an episode of apnea, o2 sats 82% upon arrival.  Pt stated that he has been dx with sleep apnea before and needs cpap at home, but can not afford the machine.  Respiratory called to give pt breathing tx.  Pt back to baseline after treatment.  Will continue to monitor.

## 2016-08-20 DIAGNOSIS — I5043 Acute on chronic combined systolic (congestive) and diastolic (congestive) heart failure: Secondary | ICD-10-CM

## 2016-08-20 DIAGNOSIS — Z91018 Allergy to other foods: Secondary | ICD-10-CM

## 2016-08-20 DIAGNOSIS — Z91030 Bee allergy status: Secondary | ICD-10-CM

## 2016-08-20 DIAGNOSIS — Z91013 Allergy to seafood: Secondary | ICD-10-CM

## 2016-08-20 DIAGNOSIS — Z888 Allergy status to other drugs, medicaments and biological substances status: Secondary | ICD-10-CM

## 2016-08-20 DIAGNOSIS — Z886 Allergy status to analgesic agent status: Secondary | ICD-10-CM

## 2016-08-20 LAB — BASIC METABOLIC PANEL
Anion gap: 9 (ref 5–15)
BUN: 17 mg/dL (ref 6–20)
CALCIUM: 7.9 mg/dL — AB (ref 8.9–10.3)
CO2: 25 mmol/L (ref 22–32)
CREATININE: 1.86 mg/dL — AB (ref 0.61–1.24)
Chloride: 101 mmol/L (ref 101–111)
GFR, EST AFRICAN AMERICAN: 51 mL/min — AB (ref >60)
GFR, EST NON AFRICAN AMERICAN: 44 mL/min — AB (ref >60)
Glucose, Bld: 117 mg/dL — ABNORMAL HIGH (ref 65–99)
Potassium: 5.8 mmol/L — ABNORMAL HIGH (ref 3.5–5.1)
SODIUM: 135 mmol/L (ref 135–145)

## 2016-08-20 MED ORDER — CHLORDIAZEPOXIDE HCL 25 MG PO CAPS: ORAL_CAPSULE | ORAL | 0 refills | Status: DC

## 2016-08-20 MED ORDER — THIAMINE HCL 100 MG PO TABS: 100.0000 mg | ORAL_TABLET | Freq: Every day | ORAL | 0 refills | Status: DC

## 2016-08-20 MED ORDER — FUROSEMIDE 20 MG PO TABS: ORAL_TABLET | ORAL | 3 refills | Status: DC

## 2016-08-20 MED ORDER — ATORVASTATIN CALCIUM 40 MG PO TABS: 40.0000 mg | ORAL_TABLET | Freq: Every day | ORAL | 0 refills | Status: DC

## 2016-08-20 MED ORDER — FOLIC ACID 1 MG PO TABS: 1.0000 mg | ORAL_TABLET | Freq: Every day | ORAL | 0 refills | Status: DC

## 2016-08-20 NOTE — Progress Notes (Signed)
Received call from [facility] with [facility], after their discussion he does not want the CPAP machine at this time; [name] [phone]

## 2016-08-20 NOTE — Progress Notes (Signed)
At 1606 pt d/c off floor to await transport by NT.  Karie Kirks, RN

## 2016-08-20 NOTE — Progress Notes (Addendum)
Physical Exam   Subjective: [name] feels like his breathing has improved significantly overnight and he feels ready to go home. He denies anxiety when seen on rounds this morning and does not mention cravings to drink.   Objective:  Vital signs in last 24 hours: Vitals:   08/20/16 0613 08/20/16 0751 08/20/16 0755 08/20/16 1116  BP: 118/84   (!) 113/54  Pulse: 95 (!) 105 (!) 105 (!) 105  Resp: 20 20 20 18   Temp: 97.1 F (36.2 C)   98.3 F (36.8 C)  TempSrc: Oral   Oral  SpO2: 98% 91% 91% 91%  Weight: (!) 383 lb 4.8 oz (173.9 kg)     Height:       Physical Exam  Constitutional: He is oriented to person, place, and time. He appears well-developed and well-nourished. No distress.  HENT:  Head: Normocephalic and atraumatic.  Eyes: Conjunctivae are normal. No scleral icterus.  Cardiovascular: Normal rate and regular rhythm.   No murmur heard. B/l 1+ pitting edema to the knees Pulmonary/Chest: Effort normal and breath sounds normal. No respiratory distress. He has no wheezes. He has no rales.  Abdominal: Soft. Bowel sounds are normal. He exhibits distension. There is no tenderness. There is no guarding.  Neurological: He is alert and oriented to person, place, and time.  Skin: Skin is warm and dry. He is not diaphoretic.   Assessment/Plan: Pt is a 41 y.o. 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     Acute exacerbation of CHF (congestive heart failure) (HCC)   Acute on chronic systolic congestive heart failure (HCC) Weight 173 kg today improved from 175 on admission but still up from dry weight 170 kg. He has diuresed 8 liters since admission on IV lasix 80 mg BID.  States that  his breathing feels significantly improved since admission and he has been achieving good urine output. He feels ready to go home today and is agreeable to complete one week of lasix 40 mg daily at home ( home dose is lasix 20 mg qd ).  - continue home medications imdur, holding home med lisinopril in the setting of AKI.    Alcohol use disorder (HCC) Hx of heavy drinking (24 beers per day) no tremor on exam. Discussed with him the option to continue librium taper at home and he agrees. Social work has discussed with him options for inpatient rehab.  -ordered librium taper  -continue thiamin   OSA  - continue CPAP qHS   Acute on CKD (chronic kidney disease), stage III Crt 1.8 with baseline 1.5. Most likely cardiorenal related to decreased perfusion in the setting of his CHF exacerbation. -continue diuresis  -follow up  BMP at hospital follow up next week  Hx of unprovoked PE  - continue home medication Xarelto 20 mg qd    Hypertension Normotensive  -Continue home medication Imdur 30 mg daily  Hyperlipidemia  - continue atorvastatin 40 mg daily   GERD  - Continue Protonix 40 mg daily and Zofran PRN  MDD  -continue home medication venlafaxine 75 mg qd   Dispo: Anticipated discharge today.   Ledell Noss, MD 08/20/2016, 2:03 PM Pager: [phone]

## 2016-08-20 NOTE — Discharge Instructions (Signed)
Thank you for trusting [place] with your medical care!  You were hospitalized for volume overload and treated with intravenous lasix.    Please take note of the following changes to your medications: START taking lasix 40 mg daily for the next week then decrease back to your regular dose 20 mg daily  START taking atorvastatin 40 mg daily  If you choose to stop drinking: START taking Librium 25 mg at 4 pm and midnight today (1/5), twice tomorrow Saturday (1/6) and once sunday(1/7)   To make sure you are getting better, please make it to the follow-up appointments listed on the first page.  If you have any questions, please call [phone].

## 2016-08-20 NOTE — Progress Notes (Signed)
Donald Brady with [facility] called for CPAP machine; she is to talk with him about arrangements; also [name] to talk to patientAneta Mins [phone]

## 2016-08-20 NOTE — Progress Notes (Signed)
All d/c instructions explained and given to pt.  Verbalized understanding. .  Nolton Denis, RN. 

## 2016-08-20 NOTE — Discharge Summary (Signed)
Name: Donald Brady MRN: [id number] DOB: [birth year] 41 y.o. PCP: Florinda Marker, MD  Date of Admission: 08/17/2016  2:15 PM Date of Discharge: 08/20/2016 Attending Physician: Dr. Murriel Hopper  Discharge Diagnosis: 1.    Acute exacerbation of CHF (congestive heart failure) ([place])  Discharge Medications: Allergies as of 08/20/2016      Reactions   Bee Venom Anaphylaxis   Honey Anaphylaxis   RAW HONEY   Shellfish Allergy Shortness Of Breath, Swelling   Ibuprofen Hives, Swelling   Betadine [povidone Iodine]    BECAUSE OF SHELLFISH ALLERGY      Medication List    TAKE these medications   albuterol 108 (90 Base) MCG/ACT inhaler Commonly known as:  PROVENTIL HFA;VENTOLIN HFA Inhale 2 puffs into the lungs every 6 (six) hours as needed for wheezing or shortness of breath.   amoxicillin-clavulanate 875-125 MG tablet Commonly known as:  AUGMENTIN Take 1 tablet by mouth 2 (two) times daily.   atorvastatin 40 MG tablet Commonly known as:  LIPITOR Take 1 tablet (40 mg total) by mouth daily at 6 PM.   chlordiazePOXIDE 25 MG capsule Commonly known as:  LIBRIUM Take one tablet at midnight tonight (1/5), two tablets tomorrow (1/6), and one tablet Sunday (1/7) Do not take with alcohol   folic acid 1 MG tablet Commonly known as:  FOLVITE Take 1 tablet (1 mg total) by mouth daily. Start taking on:  08/21/2016   furosemide 20 MG tablet Commonly known as:  LASIX Take two tablets (40 mg) daily for one week 1/5-1/07/2017. Then resume normal dose 20 mg daily. What changed:  how much to take  how to take this  when to take this  additional instructions  Another medication with the same name was removed. Continue taking this medication, and follow the directions you see here.   hydrALAZINE 25 MG tablet Commonly known as:  APRESOLINE Take 1 tablet (25 mg total) by mouth 3 (three) times daily.   isosorbide mononitrate 30 MG 24 hr tablet Commonly known as:  IMDUR TAKE 1 Tablet BY  MOUTH ONCE DAILY   lisinopril 40 MG tablet Commonly known as:  PRINIVIL,ZESTRIL Take 1 tablet (40 mg total) by mouth daily.   mometasone-formoterol 200-5 MCG/ACT Aero Commonly known as:  DULERA Inhale 2 puffs into the lungs 2 (two) times daily.   rivaroxaban 20 MG Tabs tablet Commonly known as:  XARELTO Take 1 tablet (20 mg total) by mouth daily with supper.   thiamine 100 MG tablet Take 1 tablet (100 mg total) by mouth daily. Start taking on:  08/21/2016   venlafaxine XR 75 MG 24 hr capsule Commonly known as:  EFFEXOR-XR Take 75 mg by mouth daily with breakfast.            Durable Medical Equipment        Start     Ordered   08/20/16 1430  For home use only DME continuous positive airway pressure (CPAP)  Once    Question Answer Comment  Patient has OSA or probable OSA Yes   Settings Autotitration   CPAP supplies needed Mask, headgear, cushions, filters, heated tubing and water chamber      01 /05/18 1431      Disposition and follow-up:   Donald Brady was discharged from [facility] in Good condition.  At the hospital follow up visit please address:  1.  CHF exacerbation - Did he complete one week of Lasix 40 mg qd and is he closer  to dry weight 170 kg? Should he be sent for outpatient evaluation by heart failure team?   OSA- Arrange for sleep study.   2.  Labs / imaging needed at time of follow-up:BMP (crt 1.8 at discharge up from b/l 1.5)   3.  Pending labs/ test needing follow-up: none   Follow-up Appointments: Follow-up Information    [place]. Go on 08/27/2016.   Why:  You have an appointment scheduled for 10:15 am  Contact information: 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 At presentation he described one week of worsening lower extremity edema, orthopnea, PND, and DOE.Marland Kitchen He was 175 kg, up from suspected dry weight 170 kg, bilateral lower extremity pitting edema, BNP 800, and chest xray with mild CHF. ECHO was performed prior to diureses and showed EF 20% with diffuse hypokinesia consistent with prior echo. Cardiac cath 02/2016 showed non occlusive LAD and RCA disease. He was started on IV Lasix 80 mg BID and diuresed 8 liters. On the day of discharge he reported improvement in his shortness of breath and his leg swelling had improved significantly. He was discharged with plan to continue diuresis at home with po lasix 40 mg daily and close follow up in the internal medicine outpatient clinic.     CKD (chronic kidney disease), stage III Crt 1.9 on admission improved to 1.8 on the day of discharge. B/l crt 1.5. Elevation in crt was attributed to decreased renal perfusion in the setting of CHF exacerbation. Needs f/u BMP.   OSA  Prior sleep study 10/2013 showed multiple apneic episodes, CPAP was ordered but he could not afford it at the time. During this hospital stay he used CPAP and reported the best sleep that he had gotten in years. [facility] to [facility] but they said he would need a more recent sleep study.     Alcohol abuse   Alcohol use disorder ([place]) Expressed the desire to quit drinking during this hospitalization. Librium taper was initiated. CIWA score 0 on the day of discharge. Was provided with 4 tablets of librium to continue taper outpatient. Expressed understanding that he should not take librium if he continues to drink alcohol. Social work provided information on inpatient alcohol  rehabilitation options.        Discharge Vitals:   BP (!) 113/54 (BP Location: Right Arm)   Pulse (!) 105   Temp 98.3 F (36.8 C) (Oral)   Resp 18   Ht 6\' 3"  (1.905 m)   Wt (!) 383 lb 4.8 oz (173.9 kg) Comment: scale c  SpO2 91%   BMI 47.91 kg/m   Pertinent Labs, Studies, and Procedures:  Procedures Performed:  Dg Chest 2 View  Result Date: 08/17/2016 CLINICAL DATA:  Chest pain and shortness of breath for the past week. One month of cough. History of CHF and asthma. Current smoker. EXAM: CHEST  2 VIEW COMPARISON:  PA and lateral chest study of June 02, 2016 FINDINGS: The  lungs are well-expanded. The interstitial markings are diffusely increased and are more conspicuous overall today. There is no alveolar infiltrate. There is no pleural effusion. The cardiac silhouette is enlarged. The central pulmonary vascularity is engorged and less distinct. The trachea is midline. The bony thorax exhibits no acute abnormality. IMPRESSION: Mild CHF with interstitial edema more conspicuous than in the past. No alveolar pneumonia. Electronically Signed   By: David  Martinique M.D.   On: 08/17/2016 15:23    2D Echo: EF 20%, diffuse hypokinesia, grade 2 diastolic dysfunction, systolic right heart dysfunction   Discharge Instructions: Discharge Instructions    Call MD for:  difficulty breathing, headache or visual disturbances    Complete by:  As directed    Call MD for:  extreme fatigue    Complete by:  As directed    Call MD for:  persistant nausea and vomiting    Complete by:  As directed    Diet - low sodium heart healthy    Complete by:  As directed    Increase activity slowly    Complete by:  As directed       Signed: Ledell Noss, MD 08/20/2016, 7:28 PM   Pager: [phone]

## 2016-08-21 ENCOUNTER — Encounter (HOSPITAL_COMMUNITY): Payer: Self-pay

## 2016-08-21 ENCOUNTER — Emergency Department (HOSPITAL_COMMUNITY)
Admission: EM | Admit: 2016-08-21 | Discharge: 2016-08-21 | Disposition: A | Discharge status: Home / Self Care | Payer: Self-pay | Attending: Dermatology | Admitting: Dermatology

## 2016-08-21 DIAGNOSIS — Z5321 Procedure and treatment not carried out due to patient leaving prior to being seen by health care provider: Secondary | ICD-10-CM | POA: Insufficient documentation

## 2016-08-21 DIAGNOSIS — R42 Dizziness and giddiness: Secondary | ICD-10-CM | POA: Insufficient documentation

## 2016-08-21 NOTE — ED Notes (Signed)
Patient decided he was not going to wait to be seen.  Stated he was only a "little dizzy" from taking 2 Benadryl

## 2016-08-21 NOTE — ED Triage Notes (Signed)
Pt was discharged today around 5pm for CHF, pt went home at dinner and then took two benadryl because his legs were itching. Woke up feeling drowsy and called EMS.

## 2016-08-23 ENCOUNTER — Encounter (HOSPITAL_COMMUNITY): Payer: Self-pay

## 2016-08-23 ENCOUNTER — Inpatient Hospital Stay (HOSPITAL_COMMUNITY)
Admission: EM | Admit: 2016-08-23 | Discharge: 2016-08-27 | DRG: 291 | Disposition: A | Discharge status: Home / Self Care | Payer: Self-pay | Attending: Oncology | Admitting: Oncology

## 2016-08-23 ENCOUNTER — Telehealth: Payer: Self-pay | Admitting: Pharmacist

## 2016-08-23 ENCOUNTER — Emergency Department (HOSPITAL_COMMUNITY): Payer: Self-pay

## 2016-08-23 DIAGNOSIS — Z7901 Long term (current) use of anticoagulants: Secondary | ICD-10-CM

## 2016-08-23 DIAGNOSIS — M549 Dorsalgia, unspecified: Secondary | ICD-10-CM

## 2016-08-23 DIAGNOSIS — K219 Gastro-esophageal reflux disease without esophagitis: Secondary | ICD-10-CM | POA: Diagnosis present

## 2016-08-23 DIAGNOSIS — Z886 Allergy status to analgesic agent status: Secondary | ICD-10-CM

## 2016-08-23 DIAGNOSIS — I426 Alcoholic cardiomyopathy: Secondary | ICD-10-CM | POA: Diagnosis present

## 2016-08-23 DIAGNOSIS — Z91018 Allergy to other foods: Secondary | ICD-10-CM

## 2016-08-23 DIAGNOSIS — Z9119 Patient's noncompliance with other medical treatment and regimen: Secondary | ICD-10-CM

## 2016-08-23 DIAGNOSIS — I130 Hypertensive heart and chronic kidney disease with heart failure and stage 1 through stage 4 chronic kidney disease, or unspecified chronic kidney disease: Principal | ICD-10-CM | POA: Diagnosis present

## 2016-08-23 DIAGNOSIS — I5041 Acute combined systolic (congestive) and diastolic (congestive) heart failure: Secondary | ICD-10-CM

## 2016-08-23 DIAGNOSIS — R112 Nausea with vomiting, unspecified: Secondary | ICD-10-CM | POA: Diagnosis present

## 2016-08-23 DIAGNOSIS — N179 Acute kidney failure, unspecified: Secondary | ICD-10-CM | POA: Diagnosis present

## 2016-08-23 DIAGNOSIS — I509 Heart failure, unspecified: Secondary | ICD-10-CM

## 2016-08-23 DIAGNOSIS — Z79899 Other long term (current) drug therapy: Secondary | ICD-10-CM

## 2016-08-23 DIAGNOSIS — Z86711 Personal history of pulmonary embolism: Secondary | ICD-10-CM

## 2016-08-23 DIAGNOSIS — G4733 Obstructive sleep apnea (adult) (pediatric): Secondary | ICD-10-CM | POA: Diagnosis present

## 2016-08-23 DIAGNOSIS — R000 Tachycardia, unspecified: Secondary | ICD-10-CM | POA: Diagnosis present

## 2016-08-23 DIAGNOSIS — F1090 Alcohol use, unspecified, uncomplicated: Secondary | ICD-10-CM | POA: Diagnosis present

## 2016-08-23 DIAGNOSIS — Z6841 Body Mass Index (BMI) 40.0 and over, adult: Secondary | ICD-10-CM

## 2016-08-23 DIAGNOSIS — N183 Chronic kidney disease, stage 3 (moderate): Secondary | ICD-10-CM | POA: Diagnosis present

## 2016-08-23 DIAGNOSIS — I248 Other forms of acute ischemic heart disease: Secondary | ICD-10-CM | POA: Diagnosis present

## 2016-08-23 DIAGNOSIS — Z888 Allergy status to other drugs, medicaments and biological substances status: Secondary | ICD-10-CM

## 2016-08-23 DIAGNOSIS — F1010 Alcohol abuse, uncomplicated: Secondary | ICD-10-CM | POA: Diagnosis present

## 2016-08-23 DIAGNOSIS — IMO0002 Reserved for concepts with insufficient information to code with codable children: Secondary | ICD-10-CM | POA: Diagnosis present

## 2016-08-23 DIAGNOSIS — Z87891 Personal history of nicotine dependence: Secondary | ICD-10-CM

## 2016-08-23 DIAGNOSIS — Z91013 Allergy to seafood: Secondary | ICD-10-CM

## 2016-08-23 DIAGNOSIS — E876 Hypokalemia: Secondary | ICD-10-CM | POA: Diagnosis present

## 2016-08-23 DIAGNOSIS — J45909 Unspecified asthma, uncomplicated: Secondary | ICD-10-CM | POA: Diagnosis present

## 2016-08-23 DIAGNOSIS — E785 Hyperlipidemia, unspecified: Secondary | ICD-10-CM | POA: Diagnosis present

## 2016-08-23 DIAGNOSIS — D6851 Activated protein C resistance: Secondary | ICD-10-CM | POA: Diagnosis present

## 2016-08-23 DIAGNOSIS — I2510 Atherosclerotic heart disease of native coronary artery without angina pectoris: Secondary | ICD-10-CM | POA: Diagnosis present

## 2016-08-23 DIAGNOSIS — F329 Major depressive disorder, single episode, unspecified: Secondary | ICD-10-CM | POA: Diagnosis present

## 2016-08-23 DIAGNOSIS — I5043 Acute on chronic combined systolic (congestive) and diastolic (congestive) heart failure: Secondary | ICD-10-CM | POA: Diagnosis present

## 2016-08-23 DIAGNOSIS — Z91030 Bee allergy status: Secondary | ICD-10-CM

## 2016-08-23 DIAGNOSIS — I2699 Other pulmonary embolism without acute cor pulmonale: Secondary | ICD-10-CM | POA: Diagnosis present

## 2016-08-23 HISTORY — DX: Heart failure, unspecified: I50.9

## 2016-08-23 LAB — BASIC METABOLIC PANEL
ANION GAP: 12 (ref 5–15)
BUN: 9 mg/dL (ref 6–20)
CHLORIDE: 99 mmol/L — AB (ref 101–111)
CO2: 26 mmol/L (ref 22–32)
Calcium: 8.2 mg/dL — ABNORMAL LOW (ref 8.9–10.3)
Creatinine, Ser: 1.96 mg/dL — ABNORMAL HIGH (ref 0.61–1.24)
GFR calc Af Amer: 48 mL/min — ABNORMAL LOW (ref >60)
GFR, EST NON AFRICAN AMERICAN: 41 mL/min — AB (ref >60)
Glucose, Bld: 126 mg/dL — ABNORMAL HIGH (ref 65–99)
POTASSIUM: 3.2 mmol/L — AB (ref 3.5–5.1)
SODIUM: 137 mmol/L (ref 135–145)

## 2016-08-23 LAB — I-STAT TROPONIN, ED: Troponin i, poc: 0.1 ng/mL (ref 0.00–0.08)

## 2016-08-23 LAB — CBC
HEMATOCRIT: 47.2 % (ref 39.0–52.0)
HEMOGLOBIN: 15.7 g/dL (ref 13.0–17.0)
MCH: 30.3 pg (ref 26.0–34.0)
MCHC: 33.3 g/dL (ref 30.0–36.0)
MCV: 90.9 fL (ref 78.0–100.0)
Platelets: 217 10*3/uL (ref 150–400)
RBC: 5.19 MIL/uL (ref 4.22–5.81)
RDW: 13.7 % (ref 11.5–15.5)
WBC: 5.4 10*3/uL (ref 4.0–10.5)

## 2016-08-23 MED ORDER — ALBUTEROL SULFATE (2.5 MG/3ML) 0.083% IN NEBU: 5.0000 mg | INHALATION_SOLUTION | Freq: Once | RESPIRATORY_TRACT | Status: DC

## 2016-08-23 MED ORDER — FUROSEMIDE 10 MG/ML IJ SOLN
80.0000 mg | Freq: Once | INTRAMUSCULAR | Status: AC
  Administered 2016-08-24: 80 mg via INTRAVENOUS
  Filled 2016-08-23: qty 8

## 2016-08-23 MED FILL — FOLIC ACID 1 MG TABLET: 1 | 30 days supply | Qty: 30 | Fill #0

## 2016-08-23 MED FILL — ATORVASTATIN 40 MG TABLET: 40 | 30 days supply | Qty: 30 | Fill #0

## 2016-08-23 NOTE — ED Triage Notes (Signed)
Pt arrived via [facility] from home c/o SOB for 3.5 hours arrived on CPAP.  EMS gave duoneb, 3 SL Nitro for central chest pain arrival pain 0/10.  324mg  ASA.  Hx CHF, Asthma.

## 2016-08-23 NOTE — Progress Notes (Signed)
Transition Care Management Follow-up Telephone Call   Date discharged? 08/20/16   How have you been since you were released from the hospital? Patient states he is feeling worse   Do you understand why you were in the hospital? yes   Do you understand the discharge instructions? yes   Where were you discharged to? home   Items Reviewed:  Medications reviewed: no---patient prefers to review medications during office visit  Allergies reviewed: no  Dietary changes reviewed: n/a due to increased symptoms and prioritizing clinic appointment  Referrals reviewed: n/a   Functional Questionnaire:   Activities of Daily Living (ADLs):   He states they are independent in the following: ambulation, bathing and hygiene, feeding, continence, grooming, toileting and dressing States they require assistance with the following: none   Any transportation issues/concerns?: no  Any patient concerns? Yes as stated above   Confirmed importance and date/time of follow-up visits scheduled yes  Provider Appointment booked with [facility] 08/27/16, advised patient to seek medical attention/schedule an earlier appointment to address worsening symptoms, patient verbalized understanding.  Confirmed with patient if condition begins to worsen call PCP or go to the ER.  Patient was given the office number and encouraged to call back with question or concerns.  : yes

## 2016-08-23 NOTE — ED Provider Notes (Signed)
[facility] DEPT Provider Note   CSN: [id number] Arrival date & time: 08/23/16  2257  By signing my name below, I, Arianna Nassar, attest that this documentation has been prepared under the direction and in the presence of Ripley Fraise, MD.  Electronically Signed: Julien Nordmann, ED Scribe. 08/23/16. 11:24 PM.    History   Chief Complaint Chief Complaint  Patient presents with  . Shortness of Breath    The history is provided by the patient and the EMS personnel. No language interpreter was used.  Shortness of Breath  This is a chronic problem. The average episode lasts 4 hours. The problem occurs frequently.The current episode started 3 to 5 hours ago. The problem has been gradually improving. Associated symptoms include neck pain and vomiting. Pertinent negatives include no fever and no leg swelling. It is unknown what precipitated the problem. He has tried nothing for the symptoms. The treatment provided moderate relief. He has had prior hospitalizations. He has had prior ED visits. Associated medical issues include asthma and heart failure.   HPI Comments: Donald Brady is a 41 y.o. male brought in by ambulance, who has a PMhx of asthma, CHF, GERD, Factor V Leiden, HLD, and HTN presents to the Emergency Department complaining of moderate, gradual improving shortness of breath that began about 3.5 hours ago. Pt reports associated abdominal "tightness", chest tightness that radiated into his neck that has since resolved and an episode of vomiting. Pt further notes that he has urinated ~ 3 times in the past 2 days. He expresses not having an appetite and not being able to sleep well for the past few days as well. He notes laying down and exertion exacerbates his shortness of breath. Pt was seen on 08/17/16 for acute on chronic CHF exacerbation and was discharged on 08/20/16. Pt notes not being placed on home O2. He received a duoneb, 3 SL of nitro, and 324 mg of ASA by EMS which he notes  helped with his symptoms. Pt denies fever, diarrhea, new onset leg swelling. He is a former smoker of about 2-3 weeks ago.  He had a recent increase in lasix He reports med compliance  Past Medical History:  Diagnosis Date  . Anxiety   . Arthritis    left knee  . Asthma    no inhaler use in 1 year  . CHF (congestive heart failure) ([place])   . Factor V Leiden ([place])   . GERD (gastroesophageal reflux disease)   . History of pulmonary embolus (PE)    takes Xarelto  . History of seizures    no known cause, per pt. - has been "years" since last seizure; no longer on anticonvulsant  . HLD (hyperlipidemia)   . Hypertension    states BP "runs high"; has been on med. "a while"  . Lateral meniscus tear 01/2015   left knee  . Shortness of breath dyspnea   . Sleep apnea    had sleep study 10/2013:  "severe" sleep apnea, states could not afford CPAP machine    Patient Active Problem List   Diagnosis Date Noted  . Alcohol use disorder ([place])   . Acute exacerbation of CHF (congestive heart failure) ([place]) 08/17/2016  . Congestive heart failure (CHF) ([place]) 08/17/2016  . Acute on chronic systolic congestive heart failure ([place])   . Dental abscess 06/01/2016  . Morbid obesity due to excess calories ([place])   . Presbyopia 01/26/2016  . Alcohol abuse 01/26/2016  . Hypertensive heart disease 10/01/2015  .  Chronic systolic CHF (congestive heart failure) ([place]) 10/01/2015  . Umbilical hernia without obstruction and without gangrene 08/29/2015  . Pre-diabetes 08/29/2015  . CKD (chronic kidney disease), stage III 08/19/2015  . 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 Cocaine use 12/23/2014  . Depression 10/10/2014  . GERD (gastroesophageal reflux disease) 07/24/2014  . Polycythemia, secondary 05/23/2014  . Factor 5 Leiden mutation, heterozygous ([place]) 05/22/2014  . Hyperlipidemia 04/23/2014  . Sinusitis, chronic 04/23/2014  . Severe obstructive sleep apnea 11/20/2013  . Essential  hypertension, benign 08/04/2013  . Asthma 02/20/2013  . Osteoarthritis of left knee 06/28/2012  . Long term current use of anticoagulant therapy 12/02/2011  . Bilateral pulmonary embolism ([place]) 11/27/2011  . Tobacco abuse 11/27/2011    Past Surgical History:  Procedure Laterality Date  . CARDIAC CATHETERIZATION N/A 02/19/2015   Procedure: Left Heart Cath and Coronary Angiography;  Surgeon: Peter M Martinique, MD;  Location: [facility] [facility];  Service: Cardiovascular;  Laterality: N/A;  . CHONDROPLASTY Left 03/26/2015   Procedure: CHONDROPLASTY;  Surgeon: Leandrew Koyanagi, MD;  Location: [place];  Service: Orthopedics;  Laterality: Left;  . ESOPHAGOGASTRODUODENOSCOPY (EGD) WITH PROPOFOL  07/10/2014  . HAND SURGERY Right    states knuckle removed  . KNEE ARTHROSCOPY WITH LATERAL MENISECTOMY Left 03/26/2015   Procedure: LEFT KNEE ARTHROSCOPY WITH LATERAL MENISECTOMY  CHONDROPLASTY;  Surgeon: Leandrew Koyanagi, MD;  Location: [facility];  Service: Orthopedics;  Laterality: Left;  . ORIF FEMUR FRACTURE Left   . VIDEO BRONCHOSCOPY Bilateral 12/06/2012   Procedure: VIDEO BRONCHOSCOPY WITHOUT FLUORO;  Surgeon: Juanito Doom, MD;  Location: [facility];  Service: Cardiopulmonary;  Laterality: Bilateral;       Home Medications    Prior to Admission medications   Medication Sig Start Date End Date Taking? Authorizing Provider  albuterol (PROVENTIL HFA;VENTOLIN HFA) 108 (90 Base) MCG/ACT inhaler Inhale 2 puffs into the lungs every 6 (six) hours as needed for wheezing or shortness of breath. 07/05/16   Florinda Marker, MD  amoxicillin-clavulanate (AUGMENTIN) 875-125 MG tablet Take 1 tablet by mouth 2 (two) times daily. Patient not taking: Reported on 08/17/2016 06/01/16   Zada Finders, MD  atorvastatin (LIPITOR) 40 MG tablet Take 1 tablet (40 mg total) by mouth daily at 6 PM. 08/20/16   Ledell Noss, MD  chlordiazePOXIDE (LIBRIUM) 25 MG capsule Take one tablet at midnight tonight  (1/5), two tablets tomorrow (1/6), and one tablet Sunday (1/7) Do not take with alcohol 08/20/16   Ledell Noss, MD  folic acid (FOLVITE) 1 MG tablet Take 1 tablet (1 mg total) by mouth daily. 08/21/16   Ledell Noss, MD  furosemide (LASIX) 20 MG tablet Take two tablets (40 mg) daily for one week 1/5-1/07/2017. Then resume normal dose 20 mg daily. 08/20/16   Ledell Noss, MD  hydrALAZINE (APRESOLINE) 25 MG tablet Take 1 tablet (25 mg total) by mouth 3 (three) times daily. 04/21/16   Alexa Angela Burke, MD  isosorbide mononitrate (IMDUR) 30 MG 24 hr tablet TAKE 1 Tablet BY MOUTH ONCE DAILY 08/03/16   Alexa Angela Burke, MD  lisinopril (PRINIVIL,ZESTRIL) 40 MG tablet Take 1 tablet (40 mg total) by mouth daily. 03/18/16   Alexa Angela Burke, MD  mometasone-formoterol (DULERA) 200-5 MCG/ACT AERO Inhale 2 puffs into the lungs 2 (two) times daily. 07/05/16   Florinda Marker, MD  rivaroxaban (XARELTO) 20 MG TABS tablet Take 1 tablet (20 mg total) by mouth daily with supper. 03/18/16   Alexa Angela Burke,  MD  thiamine 100 MG tablet Take 1 tablet (100 mg total) by mouth daily. 08/21/16   Ledell Noss, MD  venlafaxine XR (EFFEXOR-XR) 75 MG 24 hr capsule Take 75 mg by mouth daily with breakfast.    Historical Provider, MD    Family History Family History  Problem Relation Age of Onset  . Diabetes Mother   . Hypertension Mother   . Hearing loss Father     died in his 73's  . Cancer Father     unknown type  . Clotting disorder Father   . Heart disease Father   . Diabetes Maternal Aunt   . Hypertension Maternal Aunt   . Diabetes Maternal Uncle   . Hypertension Maternal Uncle   . Stomach cancer Maternal Uncle   . Lung cancer Paternal Barbaraann Rondo     was a smoker  . Breast cancer Maternal Aunt   . Stomach cancer Maternal Uncle   . Liver disease Maternal Uncle     drinker  . Kidney disease Maternal Aunt     Social History Social History  Substance Use Topics  . Smoking status: Former Smoker    Packs/day: 1.00    Years: 21.00    Types:  Cigarettes    Quit date: 07/31/2016  . Smokeless tobacco: Never Used     Comment: Stress.None in last couple days  . Alcohol use 0.0 oz/week     Comment: weekends     Allergies   Bee venom; Honey; Shellfish allergy; Ibuprofen; and Betadine [povidone iodine]   Review of Systems Review of Systems  Constitutional: Negative for fever.  Respiratory: Positive for chest tightness and shortness of breath.   Cardiovascular: Negative for leg swelling.  Gastrointestinal: Positive for vomiting.  Musculoskeletal: Positive for neck pain.  All other systems reviewed and are negative.    Physical Exam Updated Vital Signs BP 144/87 (BP Location: Right Arm)   Pulse 112   Temp 97.9 F (36.6 C) (Oral)   Resp 26   Ht 6\' 3"  (1.905 m)   Wt (!) 400 lb (181.4 kg)   SpO2 96%   BMI 50.00 kg/m   Physical Exam CONSTITUTIONAL: Well developed/well nourished HEAD: Normocephalic/atraumatic EYES: EOMI/PERRL ENMT: Mucous membranes moist NECK: supple no meningeal signs SPINE/BACK:entire spine nontender CV: S1/S2 noted, no murmurs/rubs/gallops noted LUNGS: mild tachypnea noted and decreased breath sounds bilaterally ABDOMEN: soft, nontender, no rebound or guarding, bowel sounds noted throughout abdomen, he is obese GU:no cva tenderness;  NEURO: Pt is awake/alert/appropriate, moves all extremitiesx4.  No facial droop.   EXTREMITIES: pulses normal/equal, full ROM; pitting edema noted in bilateral lower extremities SKIN: warm, color normal PSYCH: no abnormalities of mood noted, alert and oriented to situation   ED Treatments / Results  DIAGNOSTIC STUDIES: Oxygen Saturation is 96% on RA, adequate by my interpretation.  COORDINATION OF CARE:  11:21 PM Discussed treatment plan with pt at bedside and pt agreed to plan.  Labs (all labs ordered are listed, but only abnormal results are displayed) Labs Reviewed  BASIC METABOLIC PANEL - Abnormal; Notable for the following:       Result Value    Potassium 3.2 (*)    Chloride 99 (*)    Glucose, Bld 126 (*)    Creatinine, Ser 1.96 (*)    Calcium 8.2 (*)    GFR calc non Af Amer 41 (*)    GFR calc Af Amer 48 (*)    All other components within normal limits  TROPONIN I - Abnormal; Notable  for the following:    Troponin I 0.10 (*)    All other components within normal limits  BRAIN NATRIURETIC PEPTIDE - Abnormal; Notable for the following:    B Natriuretic Peptide 915.1 (*)    All other components within normal limits  I-STAT TROPOININ, ED - Abnormal; Notable for the following:    Troponin i, poc 0.10 (*)    All other components within normal limits  CBC    EKG  EKG Interpretation  Date/Time:  Monday August 23 2016 23:00:34 EST Ventricular Rate:  110 PR Interval:    QRS Duration: 86 QT Interval:  296 QTC Calculation: 401 R Axis:   -43 Text Interpretation:  Sinus tachycardia Left atrial enlargement Left axis deviation Borderline T wave abnormalities qt is shortened compared to prior Otherwise no significant change Confirmed by [place] ([id number]) on 08/23/2016 11:12:58 PM       Radiology Dg Chest 2 View  Result Date: 08/23/2016 CLINICAL DATA:  Shortness of breath for 3.5 hours. Central chest pain and tightness. EXAM: CHEST  2 VIEW COMPARISON:  08/17/2016 FINDINGS: Cardiac enlargement with mild pulmonary vascular congestion. Interstitial changes suggest mild interstitial edema. Similar appearance to previous study. No focal consolidation. No blunting of costophrenic angles. No pneumothorax. Mediastinal contours appear intact. Old fracture deformity of the left clavicle. IMPRESSION: Mild congestive changes with cardiac enlargement, pulmonary vascular congestion, and mild interstitial edema similar to prior studies. Electronically Signed   By: Lucienne Capers M.D.   On: 08/23/2016 23:37    Procedures Procedures (including critical care time)  Medications Ordered in ED Medications  albuterol (PROVENTIL) (2.5  MG/3ML) 0.083% nebulizer solution 5 mg (0 mg Nebulization Hold 08/23/16 2329)  furosemide (LASIX) injection 80 mg (80 mg Intravenous Given 08/24/16 0014)     Initial Impression / Assessment and Plan / ED Course  I have reviewed the triage vital signs and the nursing notes.  Pertinent labs & imaging results that were available during my care of the patient were reviewed by me and considered in my medical decision making (see chart for details).  Clinical Course     12:01 AM Pt here for worsening dyspnea, with some associated CP tonight He is now pain free He feels like his CHF is worse He is about 5 kg heavier than when he was discharged on 1/5 Will give IV lasix Will monitor He may require admission 2:47 AM Pt with about 1.7L urine output He feels improved but not at baseline No other new complaints Will admit D/w internal medicine team for admission  Final Clinical Impressions(s) / ED Diagnoses   Final diagnoses:  Acute combined systolic and diastolic congestive heart failure ([place])   I personally performed the services described in this documentation, which was scribed in my presence. The recorded information has been reviewed and is accurate.     New Prescriptions New Prescriptions   No medications on file     Ripley Fraise, MD 08/24/16 [phone]

## 2016-08-24 DIAGNOSIS — Z79899 Other long term (current) drug therapy: Secondary | ICD-10-CM

## 2016-08-24 DIAGNOSIS — Z91013 Allergy to seafood: Secondary | ICD-10-CM

## 2016-08-24 DIAGNOSIS — Z888 Allergy status to other drugs, medicaments and biological substances status: Secondary | ICD-10-CM

## 2016-08-24 DIAGNOSIS — F329 Major depressive disorder, single episode, unspecified: Secondary | ICD-10-CM

## 2016-08-24 DIAGNOSIS — I428 Other cardiomyopathies: Secondary | ICD-10-CM

## 2016-08-24 DIAGNOSIS — I509 Heart failure, unspecified: Secondary | ICD-10-CM

## 2016-08-24 DIAGNOSIS — Z86711 Personal history of pulmonary embolism: Secondary | ICD-10-CM

## 2016-08-24 DIAGNOSIS — F17210 Nicotine dependence, cigarettes, uncomplicated: Secondary | ICD-10-CM

## 2016-08-24 DIAGNOSIS — F1020 Alcohol dependence, uncomplicated: Secondary | ICD-10-CM

## 2016-08-24 DIAGNOSIS — Z886 Allergy status to analgesic agent status: Secondary | ICD-10-CM

## 2016-08-24 DIAGNOSIS — I120 Hypertensive chronic kidney disease with stage 5 chronic kidney disease or end stage renal disease: Secondary | ICD-10-CM

## 2016-08-24 DIAGNOSIS — Z8249 Family history of ischemic heart disease and other diseases of the circulatory system: Secondary | ICD-10-CM

## 2016-08-24 DIAGNOSIS — Z7901 Long term (current) use of anticoagulants: Secondary | ICD-10-CM

## 2016-08-24 DIAGNOSIS — K219 Gastro-esophageal reflux disease without esophagitis: Secondary | ICD-10-CM

## 2016-08-24 DIAGNOSIS — Z91030 Bee allergy status: Secondary | ICD-10-CM

## 2016-08-24 DIAGNOSIS — G478 Other sleep disorders: Secondary | ICD-10-CM

## 2016-08-24 DIAGNOSIS — I5023 Acute on chronic systolic (congestive) heart failure: Secondary | ICD-10-CM

## 2016-08-24 DIAGNOSIS — J45909 Unspecified asthma, uncomplicated: Secondary | ICD-10-CM

## 2016-08-24 DIAGNOSIS — E785 Hyperlipidemia, unspecified: Secondary | ICD-10-CM

## 2016-08-24 DIAGNOSIS — Z832 Family history of diseases of the blood and blood-forming organs and certain disorders involving the immune mechanism: Secondary | ICD-10-CM

## 2016-08-24 DIAGNOSIS — Z91018 Allergy to other foods: Secondary | ICD-10-CM

## 2016-08-24 DIAGNOSIS — I5043 Acute on chronic combined systolic (congestive) and diastolic (congestive) heart failure: Secondary | ICD-10-CM

## 2016-08-24 DIAGNOSIS — I5041 Acute combined systolic (congestive) and diastolic (congestive) heart failure: Secondary | ICD-10-CM | POA: Insufficient documentation

## 2016-08-24 DIAGNOSIS — N179 Acute kidney failure, unspecified: Secondary | ICD-10-CM

## 2016-08-24 DIAGNOSIS — N183 Chronic kidney disease, stage 3 (moderate): Secondary | ICD-10-CM

## 2016-08-24 DIAGNOSIS — Z833 Family history of diabetes mellitus: Secondary | ICD-10-CM

## 2016-08-24 DIAGNOSIS — E876 Hypokalemia: Secondary | ICD-10-CM

## 2016-08-24 LAB — BASIC METABOLIC PANEL
ANION GAP: 9 (ref 5–15)
ANION GAP: 10 (ref 5–15)
BUN: 8 mg/dL (ref 6–20)
BUN: 11 mg/dL (ref 6–20)
CALCIUM: 8.5 mg/dL — AB (ref 8.9–10.3)
CHLORIDE: 101 mmol/L (ref 101–111)
CO2: 30 mmol/L (ref 22–32)
CO2: 26 mmol/L (ref 22–32)
CREATININE: 1.97 mg/dL — AB (ref 0.61–1.24)
Calcium: 8.3 mg/dL — ABNORMAL LOW (ref 8.9–10.3)
Chloride: 99 mmol/L — ABNORMAL LOW (ref 101–111)
Creatinine, Ser: 2.03 mg/dL — ABNORMAL HIGH (ref 0.61–1.24)
GFR calc Af Amer: 48 mL/min — ABNORMAL LOW (ref >60)
GFR calc non Af Amer: 41 mL/min — ABNORMAL LOW (ref >60)
GFR, EST AFRICAN AMERICAN: 46 mL/min — AB (ref >60)
GFR, EST NON AFRICAN AMERICAN: 40 mL/min — AB (ref >60)
GLUCOSE: 134 mg/dL — AB (ref 65–99)
Glucose, Bld: 150 mg/dL — ABNORMAL HIGH (ref 65–99)
POTASSIUM: 3.4 mmol/L — AB (ref 3.5–5.1)
Potassium: 3.5 mmol/L (ref 3.5–5.1)
SODIUM: 138 mmol/L (ref 135–145)
SODIUM: 137 mmol/L (ref 135–145)

## 2016-08-24 LAB — TROPONIN I
TROPONIN I: 0.09 ng/mL — AB (ref <0.03)
TROPONIN I: 0.1 ng/mL — AB (ref <0.03)
TROPONIN I: 0.1 ng/mL — AB (ref <0.03)

## 2016-08-24 LAB — BRAIN NATRIURETIC PEPTIDE: B NATRIURETIC PEPTIDE 5: 915.1 pg/mL — AB (ref 0.0–100.0)

## 2016-08-24 MED ORDER — SODIUM CHLORIDE 0.9% FLUSH
3.0000 mL | Freq: Two times a day (BID) | INTRAVENOUS | Status: DC
  Administered 2016-08-24 – 2016-08-26 (×5): 3 mL via INTRAVENOUS

## 2016-08-24 MED ORDER — ISOSORBIDE MONONITRATE ER 30 MG PO TB24
30.0000 mg | ORAL_TABLET | Freq: Every day | ORAL | Status: DC
  Administered 2016-08-24 – 2016-08-25 (×2): 30 mg via ORAL
  Filled 2016-08-24 (×2): qty 1

## 2016-08-24 MED ORDER — HYDRALAZINE HCL 25 MG PO TABS
12.5000 mg | ORAL_TABLET | Freq: Three times a day (TID) | ORAL | Status: DC
  Administered 2016-08-24 – 2016-08-25 (×3): 12.5 mg via ORAL
  Filled 2016-08-24 (×3): qty 1

## 2016-08-24 MED ORDER — MOMETASONE FURO-FORMOTEROL FUM 200-5 MCG/ACT IN AERO
2.0000 | INHALATION_SPRAY | Freq: Two times a day (BID) | RESPIRATORY_TRACT | Status: DC
  Administered 2016-08-24 – 2016-08-27 (×6): 2 via RESPIRATORY_TRACT
  Filled 2016-08-24 (×2): qty 8.8

## 2016-08-24 MED ORDER — SODIUM CHLORIDE 0.9 % IV SOLN: 250.0000 mL | INTRAVENOUS | Status: DC | PRN

## 2016-08-24 MED ORDER — NICOTINE 21 MG/24HR TD PT24
21.0000 mg | MEDICATED_PATCH | Freq: Every day | TRANSDERMAL | Status: DC
  Administered 2016-08-24 – 2016-08-27 (×4): 21 mg via TRANSDERMAL
  Filled 2016-08-24 (×4): qty 1

## 2016-08-24 MED ORDER — FUROSEMIDE 10 MG/ML IJ SOLN
80.0000 mg | Freq: Once | INTRAMUSCULAR | Status: AC
  Administered 2016-08-24: 80 mg via INTRAVENOUS
  Filled 2016-08-24: qty 8

## 2016-08-24 MED ORDER — SPIRONOLACTONE 25 MG PO TABS
12.5000 mg | ORAL_TABLET | Freq: Every day | ORAL | Status: DC
  Administered 2016-08-24 – 2016-08-25 (×2): 12.5 mg via ORAL
  Filled 2016-08-24 (×3): qty 1

## 2016-08-24 MED ORDER — ALBUTEROL SULFATE (2.5 MG/3ML) 0.083% IN NEBU: 2.5000 mg | INHALATION_SOLUTION | RESPIRATORY_TRACT | Status: DC | PRN

## 2016-08-24 MED ORDER — SODIUM CHLORIDE 0.9% FLUSH: 3.0000 mL | INTRAVENOUS | Status: DC | PRN

## 2016-08-24 MED ORDER — ACETAMINOPHEN 325 MG PO TABS
650.0000 mg | ORAL_TABLET | ORAL | Status: DC | PRN
  Administered 2016-08-25: 650 mg via ORAL
  Filled 2016-08-24: qty 2

## 2016-08-24 MED ORDER — PANTOPRAZOLE SODIUM 40 MG PO TBEC
40.0000 mg | DELAYED_RELEASE_TABLET | Freq: Every day | ORAL | Status: DC
  Administered 2016-08-24 – 2016-08-27 (×4): 40 mg via ORAL
  Filled 2016-08-24 (×4): qty 1

## 2016-08-24 MED ORDER — FUROSEMIDE 10 MG/ML IJ SOLN
80.0000 mg | Freq: Two times a day (BID) | INTRAMUSCULAR | Status: DC
  Administered 2016-08-25 (×2): 80 mg via INTRAVENOUS
  Filled 2016-08-24 (×2): qty 8

## 2016-08-24 MED ORDER — RIVAROXABAN 20 MG PO TABS
20.0000 mg | ORAL_TABLET | Freq: Every day | ORAL | Status: DC
  Administered 2016-08-24 – 2016-08-26 (×3): 20 mg via ORAL
  Filled 2016-08-24 (×3): qty 1

## 2016-08-24 MED ORDER — ONDANSETRON HCL 4 MG/2ML IJ SOLN: 4.0000 mg | Freq: Four times a day (QID) | INTRAMUSCULAR | Status: DC | PRN

## 2016-08-24 MED ORDER — BENZONATATE 100 MG PO CAPS: 200.0000 mg | ORAL_CAPSULE | Freq: Three times a day (TID) | ORAL | Status: DC | PRN

## 2016-08-24 MED ORDER — POTASSIUM CHLORIDE CRYS ER 20 MEQ PO TBCR
40.0000 meq | EXTENDED_RELEASE_TABLET | Freq: Every day | ORAL | Status: DC
  Administered 2016-08-24: 40 meq via ORAL
  Filled 2016-08-24: qty 2

## 2016-08-24 MED ORDER — ATORVASTATIN CALCIUM 40 MG PO TABS: 40.0000 mg | ORAL_TABLET | Freq: Every day | ORAL | Status: DC

## 2016-08-24 MED ORDER — NITROGLYCERIN 0.4 MG SL SUBL
0.4000 mg | SUBLINGUAL_TABLET | SUBLINGUAL | Status: DC | PRN
  Administered 2016-08-24: 0.4 mg via SUBLINGUAL
  Filled 2016-08-24: qty 1

## 2016-08-24 MED ORDER — OXYCODONE-ACETAMINOPHEN 5-325 MG PO TABS
2.0000 | ORAL_TABLET | Freq: Once | ORAL | Status: AC
  Administered 2016-08-24: 2 via ORAL
  Filled 2016-08-24: qty 2

## 2016-08-24 MED ORDER — VENLAFAXINE HCL ER 75 MG PO CP24
75.0000 mg | ORAL_CAPSULE | Freq: Every day | ORAL | Status: DC
  Administered 2016-08-24 – 2016-08-27 (×4): 75 mg via ORAL
  Filled 2016-08-24 (×5): qty 1

## 2016-08-24 NOTE — ED Notes (Signed)
Departure condition charted on wrong patient

## 2016-08-24 NOTE — ED Notes (Signed)
Pt's mother asked this nurse to come out to the lobby to speak with her.  Pt's mother advised that pt sleeps on his knees at home, refuses to have a scale in the house and doesn't manage his input.  Pt's mother states that he isn't to go home and that she would turn around and bring him right back.  Advised that I would let the Christy Gentles, MD know.

## 2016-08-24 NOTE — ED Notes (Signed)
Breakfast tray ordered 

## 2016-08-24 NOTE — Care Management Note (Signed)
Case Management Note  Patient Details  Name: Donald Brady MRN: [id number] Date of Birth: [birth year]  Subjective/Objective:                  From home. /40 yo male with PMH of NICM with chronic systolic congestive heart failure, bilateral PE on chronic anticoagulation, Factor V Leiden Heterogenous, HTN, CKD stage 3, GERD, MDD, Asthma, polysubstance abuse presenting to the ED for chest pain and shortness of breath. Patient was recently discharged on 01/05 for CHF exacerbation and alcohol detox  Action/Plan: Follow for disposition needs. /Admit to OBSERVATION (Acute exacerbation of chronic combined CHF); anticipate discharge [place].    Expected Discharge Date:  08/26/16               Expected Discharge Plan:  [place]  In-House Referral:  NA  Discharge planning Services  CM Consult, Medication Assistance  Status of Service:  In process, will continue to follow    Additional Comments:  Fuller Mandril, RN 08/24/2016, 9:48 AM

## 2016-08-24 NOTE — Progress Notes (Signed)
RT note: Placed patient on cpap auto titration 20/5 with 3L O2 bleed in per RCP. Patienty tolerating well.

## 2016-08-24 NOTE — H&P (Signed)
Date: 08/24/2016               Patient Name:  Donald Brady MRN: [id number]  DOB: 1975-08-25 Age / Sex: 41 y.o., male   PCP: Florinda Marker, MD         Medical Service: Internal Medicine Teaching Service         Attending Physician: Dr. Annia Belt, MD    First Contact: Dr. Hetty Ely Pager: [phone]  Second Contact: Dr. 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 Patient was recently discharged on 01/05 for CHF exacerbation and alcohol detox; he was treated with IV lasix 80mg  BID and was discharged on PO lasix 40mg  daily. Patient states that since then, he felt alright the first two days then began having orthopnea, PND, shortness of breath on exertion, and LE swelling. He has also had an increase in cough and sputum production (clear, without hemoptysis). Earlier this evening, he was sitting down and developed left sided sharp and squeezing pain that radiated to his left neck and shoulder, along with a tightness in his stomach and an exacerbation of his shortness of breath. He called EMS who administered 324mg  ASA, 3 sl nitro which relieved his chest pain; they also placed him on CPAP and administered duoneb.   He states that he has been compliant with his medicines but has noticed a decline in urine output especially in the last two days. He endorses never picking up his Librium taper; he states that he had 2 sips of beer over the weekend which did not taste good and has been abstinent from alcohol otherwise. He  also has not resumed smoking. His PO intake consists of 12oz of water with each meal; denies excess salt use. Over the last couple of days, he has had increased nausea and vomiting with meals similar to prior presentation - he has picked up pantoprazole script yet - he states that while he was in the hospital, his symptoms were well controlled with pantoprazole and PRN zofran. He states that he usually vomits about 15 minutes after a meal and that this has happened about twice this week with meals when he took Xarelto and is uncertain if he kept the pill down.   He denies fevers and chills, though still has intermittent episodes of diaphoresis that were present before last admission. He denies abdominal pain, diarrhea, constipation, melena, hematochezia, hematuria, congestion, rhinorrhea, headache, vision or hearing changes.   Discharge weight 173kg (dry weight 170kg), currently 175kg.  Meds:  Current Meds  Medication Sig  . albuterol (PROVENTIL HFA;VENTOLIN HFA) 108 (90 Base) MCG/ACT inhaler Inhale 2 puffs into the lungs every 6 (six) hours as needed for wheezing or shortness of breath.  . furosemide (LASIX) 20 MG tablet Take two tablets (40 mg) daily for one week 1/5-1/07/2017. Then resume normal dose 20 mg daily.  . isosorbide mononitrate (IMDUR) 30 MG 24 hr tablet TAKE 1 Tablet BY MOUTH ONCE DAILY  . lisinopril (PRINIVIL,ZESTRIL) 40 MG tablet  Take 1 tablet (40 mg total) by mouth daily.  . mometasone-formoterol (DULERA) 200-5 MCG/ACT AERO Inhale 2 puffs into the lungs 2 (two) times daily.  . rivaroxaban (XARELTO) 20 MG TABS tablet Take 1 tablet (20 mg total) by mouth daily with supper.  . venlafaxine XR (EFFEXOR-XR) 75 MG 24 hr capsule Take 75 mg by mouth daily with breakfast.    Allergies: Allergies as of 08/23/2016 - Review Complete 08/23/2016  Allergen Reaction Noted  . Bee venom Anaphylaxis 11/27/2011  . Honey Anaphylaxis 11/27/2011  . Shellfish allergy Shortness Of Breath and Swelling  11/27/2011  . Ibuprofen Hives and Swelling 11/27/2011  . Betadine [povidone iodine]  01/20/2015   Past Medical History:  Diagnosis Date  . Anxiety   . Arthritis    left knee  . Asthma    no inhaler use in 1 year  . CHF (congestive heart failure) ([place])   . Factor V Leiden ([place])   . GERD (gastroesophageal reflux disease)   . History of pulmonary embolus (PE)    takes Xarelto  . History of seizures    no known cause, per pt. - has been "years" since last seizure; no longer on anticonvulsant  . HLD (hyperlipidemia)   . Hypertension    states BP "runs high"; has been on med. "a while"  . Lateral meniscus tear 01/2015   left knee  . Shortness of breath dyspnea   . Sleep apnea    had sleep study 10/2013:  "severe" sleep apnea, states could not afford CPAP machine    Family History: Mother with DM, HTN. Father with Clotting disorder (Factor V?), heart disease  Social History: H/o 24 beers per day over last 2 years - denies alcohol use since discharge; former 2ppd smoker, quit 2 weeks ago. Denies recent drug use.   Review of Systems: A complete ROS was negative except as per HPI.  Physical Exam: Blood pressure 139/91, pulse 102, temperature 97.9 F (36.6 C), temperature source Oral, resp. rate 20, height 6\' 3"  (1.905 m), weight (!) 385 lb 9.6 oz (174.9 kg), SpO2 96 %. General: alert, well-developed, and cooperative to examination.  Head: normocephalic and atraumatic.  Eyes: vision grossly intact, pupils equal, pupils round, pupils reactive to light, no injection and anicteric.  Mouth: pharynx pink and moist, no erythema, and no exudates.  Neck: supple, full ROM, no thyromegaly, no JVD.  Lungs: normal respiratory effort, no accessory muscle use, normal breath sounds, no \\and  no wheezes. Bibasilar crackles. Heart: Distant heart sounds, tachycardic, no murmur, rubs, or gallops appreciated.  Abdomen: soft, non-tender, distended, +BS, no appreciable organomegaly.   Msk: no joint  swelling, no joint warmth, and no redness over joints. Reproducible chest pain (associated with cough only). Pulses: 2+ DP/PT pulses bilaterally Extremities: No cyanosis, clubbing, 1+ pitting edema to knees Neurologic: alert & oriented X3, cranial nerves II-XII intact, strength normal in all extremities, sensation intact to light touch.  Skin: turgor normal and no rashes.  Psych: Oriented X3, memory intact for recent and remote, normally interactive, good eye contact, not anxious appearing, and not depressed appearing  LABS: BNP 900 Na 137, K 3.2, Cl 99, CO2 26, BUN 9, Cr 1.96 (b/l 1.5), Glu 126 WBC 5.4, Hgb 15.7, Hct 47.2, Plts 217 Trop 0.10  EKG: Personally reviewed - sinus tachycardia, LA enlargement, no significant change from previous  CXR: Personally reviewed - cardiomegaly, vascular congestion  Assessment & Plan by Problem: Active Problems:   CHF (congestive heart failure) (HCC)  Acute exacerbation  of chronic combined CHF: Patient with progressive symptoms of shortness of breath on exertion, LE swelling, productive cough, orthopnea and chest pain. Patient on imdur 30mg  daily and lisinopril 40mg  daily; has not restarted hydralazine. Echo on 1/03 shows EF of 20%, mild LVH, diffuse hypokinesis and grade 2 diastolic dysfunction, elevated PA peak pressure and elevation of RA pressure (before diuresis). Today, his BNP is elevated, CXR similar to last admission, he is ~5kg above est dry weight of 170kg, and exam is positive for LE edema and bibasilar crackles. It is possible that the home dose of lasix was not achieving adequate diuresis as patient had decreased UOP. --strict in/outs; daily weights --IV lasix 80mg  BID --continue Imdur 30mg  daily; consider restarting hydralazine --hold lisinopril in setting of AKI --consider HF consult in AM  --CM consult to provide scale; they are still working on medicaid application - appreciate their assistance with that --PT/OT to assess needs for  home --supplemental O2 to maintain O2 sats 90-95%.   Chest pain: Patient with left sided sharp and squeezing chest pain associated with his shortness of breath and radiation to left neck and shoulder that is different from his sharp, reproducible chest pain with cough. Patient with no acute ischemic changes on EKG and elevated initial troponin at 0.10. He has had chronic elevation of troponin likely 2/2 CHF.  --trend troponins --f/u AM EKG  AKI on CKD: Patient with Cr of 1.96 from b/l of 1.5 likely from fluid overload and patient endorses restarting lisinopril at discharge.  --hold lisinopril at this time; resume once renal function returns to baseline --f/u daily BMP  Hypokalemia: Patient with K of 3.2 on admission and receiving IV diuresis.  --KDUR 72mEq daily --f/u daily BMP  H/o unprovoked PE: Patient compliant with home regimen of Xarelto 20mg  daily though he has had a couple of episodes of emesis after taking xarelto and is uncertain whether pill remained.  --continue Xarelto 20mg  daily  OSA: Patient with OSA not on CPAP at home due to cost and need more recent sleep study. --CPAP qhs  Asthma: Patient continued on his home albuterol neb PRN and dulera BID.   Hypertension: Patient normotensive on exam.  --continued on home Imdur 30mg  daily. --consider restarting hydralazine at discharge --holding lisinopril in setting of AKI, resume on resolution.  Hyperlipidemia: Patient continued on home dose of atorvastatin 40mg  daily.   GERD: Patient continued on his home dose of protonix 40mg  daily with zofran PRN  MDD: Patient continued on his home dose of venlafaxine 75mg  daily  Dispo: Admit patient to Observation with expected length of stay less than 2 midnights.  Signed: Alphonzo Grieve, MD 08/24/2016, 4:29 AM  Pager [phone]

## 2016-08-24 NOTE — ED Notes (Signed)
Admitting MD paged and notified to come speak with family.

## 2016-08-24 NOTE — ED Notes (Signed)
Patient moved to hospital bed for comfort 

## 2016-08-24 NOTE — Progress Notes (Signed)
Subjective: [name] feels that his breathing has improved since receiving two doses of lasix in the ED. He denies anxiety at this time.   Objective:  Vital signs in last 24 hours: Vitals:   08/24/16 1430 08/24/16 1445 08/24/16 2030 08/24/16 2116  BP: 113/79 132/88 105/67   Pulse: 113 114 103 (!) 111  Resp: 25 26 18 20   Temp:    98.6 F (37 C)  TempSrc:    Oral  SpO2: 91% 95% 91% 99%  Weight:    (!) 385 lb 6.4 oz (174.8 kg)  Height:    6\' 3"  (1.905 m)   Physical Exam  Constitutional: He appears well-developed and well-nourished. No distress.  Sleeping comfortably with CPAP   Cardiovascular: Normal rate and regular rhythm.   No murmur heard. 2+ bilateral lower extremity pitting edema   Pulmonary/Chest: Effort normal. No respiratory distress. He has no wheezes. He has no rales.  Abdominal: Soft. He exhibits distension. There is no tenderness. There is no guarding.  Skin: Skin is warm and dry. He is not diaphoretic.     Assessment/Plan: Pt is a 41 y.o. 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   Active Problems:   Bilateral pulmonary embolism (HCC)   Long term current use of anticoagulant therapy   Severe obstructive sleep apnea   Factor 5 Leiden mutation, heterozygous (HCC)   Acute exacerbation of CHF (congestive heart failure) (HCC)   Alcohol use disorder (HCC)   CHF (congestive heart failure) (HCC)   CHF exacerbation (HCC)  Acute exacerbation of CHF  Presented acute CHF exacerbation after failed outpatient oral diuresis. Presented today WIth BNP 900 and weight 175 kg up from b/l 170 kg. At last admission ECHO showed EF 20% with diffuse hypokinesia and grade 2 diastolic dysfunction, this was at the time of fluid overload.  Heart failure was consulted and saw him in the ED today, they have optimized his medication management and will have him follow up in heart failure clinic, we appreciate their recommendations. Chest pain on presentation, EKG showed no acute ischemic changes, troponin peak 0.1. May be demand ischemia in the setting of heart failure exacerbation. Will continue to monitor.  -ordered IV lasix 80 mg BID  -started spironolactone 12.5 mg qd and hydralizine 12.5 mg TID -continue imdur 30 mg qd -holding lisinopril in the setting of AKI  -follow up TSH  -continue daily weights and strict I&Os -follow up case management consult to provide scale (they are working on [employer] application)   AKI on CKD  Crt 1.96 on admission up from b/l 1.5. Likely poor perfusion from fluid overload.  -holding lisinopril, can resume once renal function returns to baseline  - follow up BMP   OSA  He is in need of a CPAP machine at home. Prior sleep study 10/2013 showed multiple apneic episodes. Case management was consulted on prior admission and [facility]. [facility] stated that he would need a more recent sleep study for evaluation of OSA. Spoke with internal medicine triage today who will attempt to reach out to [facility] while we attempt to order inpatient sleep study. He has a higher risk of cardiac dysfunction if his OSA continues to go untreated.  - ordered  apneic monitoring   Hx of unprovoked PE  Continue xarelto 20 mg daily   Asthma  Continue home meds albuterol nebs PRN and dulera BID.  HTN  Currently normotensive  - continue home meds imdur 30 mg qd  -started Hydralazine 12.5 mg TID, and spironolactone 12.5 mg qd will continue at discharge    Dispo: Anticipated discharge in approximately 2-4 day(s).   Ledell Noss, MD 08/24/2016, 9:55 PM Pager: [phone]

## 2016-08-24 NOTE — ED Notes (Signed)
Dr. Bensimon at bedside.  

## 2016-08-24 NOTE — Consult Note (Signed)
Advanced Heart Failure Team Consult Note  Referring Physician: Dr Donald Donald Brady Primary Physician: Dr Donald Donald Brady  Primary Cardiologist:  Dr Donald Donald Brady   Reason for Consultation:Heart Failure    HPI:   Mr Donald Donald Brady is a 41 year old with a history of chronic systolic heart failure (suspected ETOH), NICM. PE 2016, factor V leiden, asthma, htn, osa, ckd stage III, major depression disorder, and ETOH abuse admitted with CP and increased. No family history of heart disease.   Hospitalized in July 0000000 with A/C systolic heart failure. Left AMA.   Admitted last week with A/C systolic heart failure. Diuresed with IV lasix and transitioned to lasix 40 mg daily. Discharge weight was  383 pounds. He say he has not felt good since discharge. Says he stopped drinking alcohol abut 1 week ago. He had been drinking > than a case of beer a day. Quit smoking 2 months ago. Says he takes his medications. Does not weigh at home. He has had a poor appetite over the last few weeks. Often nauseated after he eats. Unemployed. Lives alone.   Presented to ED with CP and dyspnea at rest. In the ED he has received a total of 160 mg IV lasix. Diuresed over 2 liters so far. CXR with pulmonary vascular congestion. Pertinent admission labs include: K 3.2, Creatinine 1.96, Hgb 15.7, BNP 915, Troponin 0.1>0.09.  ECHO 08/18/2016: EF 20%  RV mildly dilated and moderately HK . Grade II DD  LHC 02/2015:  1. 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 Moderate LV dysfunction   Review of Systems: [y] = yes, [ ]  = no   General: Weight gain [Y ]; Weight loss [ ] ; Anorexia [ ] ; Fatigue [Y ]; Fever [ ] ; Chills [ ] ; Weakness [ Y]  Cardiac: Chest pain/pressure [ ] ; Resting SOB [Y ]; Exertional SOB [ Y]; Orthopnea [ Y]; Pedal Edema [ Y]; Palpitations [ ] ; Syncope [ ] ; Presyncope [ ] ; Paroxysmal nocturnal dyspnea[ ]   Pulmonary: Cough [ ] ; Wheezing[ ] ; Hemoptysis[ ] ; Sputum [ ] ; Snoring [ ]   GI: Vomiting[ ] ; Dysphagia[ ] ;  Melena[ ] ; Hematochezia [ ] ; Heartburn[ ] ; Abdominal pain [ ] ; Constipation [ ] ; Diarrhea [ ] ; BRBPR [ ]   GU: Hematuria[ ] ; Dysuria [ ] ; Nocturia[ ]   Vascular: Pain in legs with walking [ ] ; Pain in feet with lying flat [ ] ; Non-healing sores [ ] ; Stroke [ ] ; TIA [ ] ; Slurred speech [ ] ;  Neuro: Headaches[ ] ; Vertigo[ ] ; Seizures[ ] ; Paresthesias[ ] ;Blurred vision [ ] ; Diplopia [ ] ; Vision changes [ ]   Ortho/Skin: Arthritis [ Y]; Joint pain [Y ]; Muscle pain [ ] ; Joint swelling [ ] ; Back Pain [ Y]; Rash [ ]   Psych: Depression[Y ]; Anxiety[ ]   Heme: Bleeding problems [ ] ; Clotting disorders [ ] ; Anemia [ ]   Endocrine: Diabetes [ ] ; Thyroid dysfunction[ ]   Home Medications Prior to Admission medications   Medication Sig Start Date End Date Taking? Authorizing Provider  albuterol (PROVENTIL HFA;VENTOLIN HFA) 108 (90 Base) MCG/ACT inhaler Inhale 2 puffs into the lungs every 6 (six) hours as needed for wheezing or shortness of breath. 07/05/16  Yes Donald Angela Burke, MD  furosemide (LASIX) 20 MG tablet Take two tablets (40 mg) daily for one week 1/5-1/07/2017. Then resume normal dose 20 mg daily. 08/20/16  Yes Donald Noss, MD  isosorbide mononitrate (IMDUR) 30 MG 24 hr tablet TAKE 1 Tablet BY MOUTH ONCE DAILY 08/03/16  Yes Donald Angela Burke, MD  lisinopril (PRINIVIL,ZESTRIL) 40 MG tablet Take 1 tablet (40 mg total) by mouth daily. 03/18/16  Yes Donald Angela Burke, MD  mometasone-formoterol (DULERA) 200-5 MCG/ACT AERO Inhale 2 puffs into the lungs 2 (two) times daily. 07/05/16  Yes Donald Angela Burke, MD  rivaroxaban (XARELTO) 20 MG TABS tablet Take 1 tablet (20 mg total) by mouth daily with supper. 03/18/16  Yes Donald Angela Burke, MD  venlafaxine XR (EFFEXOR-XR) 75 MG 24 hr capsule Take 75 mg by mouth daily with breakfast.   Yes Historical Provider, MD    Past Medical History: Past Medical History:  Diagnosis Date  . Anxiety   . Arthritis    left knee  . Asthma    no inhaler use in 1 year  . CHF (congestive heart failure)  (Donald Donald [facility])   . Factor V Leiden (Donald Donald Brady)   . GERD (gastroesophageal reflux disease)   . History of pulmonary embolus (PE)    takes Xarelto  . History of seizures    no known cause, per pt. - has been "years" since last seizure; no longer on anticonvulsant  . HLD (hyperlipidemia)   . Hypertension    states BP "runs high"; has been on med. "a while"  . Lateral meniscus tear 01/2015   left knee  . Shortness of breath dyspnea   . Sleep apnea    had sleep study 10/2013:  "severe" sleep apnea, states could not afford CPAP machine    Past Surgical History: Past Surgical History:  Procedure Laterality Date  . CARDIAC CATHETERIZATION N/A 02/19/2015   Procedure: Left Heart Cath and Coronary Angiography;  Surgeon: Donald M Martinique, MD;  Location: [facility];  Service: Cardiovascular;  Laterality: N/A;  . CHONDROPLASTY Left 03/26/2015   Procedure: CHONDROPLASTY;  Surgeon: Donald Koyanagi, MD;  Location: [facility];  Service: Orthopedics;  Laterality: Left;  . ESOPHAGOGASTRODUODENOSCOPY (EGD) WITH PROPOFOL  07/10/2014  . HAND SURGERY Right    states knuckle removed  . KNEE ARTHROSCOPY WITH LATERAL MENISECTOMY Left 03/26/2015   Procedure: LEFT KNEE ARTHROSCOPY WITH LATERAL MENISECTOMY  CHONDROPLASTY;  Surgeon: Donald Koyanagi, MD;  Location: [facility];  Service: Orthopedics;  Laterality: Left;  . ORIF FEMUR FRACTURE Left   . VIDEO BRONCHOSCOPY Bilateral 12/06/2012   Procedure: VIDEO BRONCHOSCOPY WITHOUT FLUORO;  Surgeon: Donald Doom, MD;  Location: [facility];  Service: Cardiopulmonary;  Laterality: Bilateral;    Family History: Family History  Problem Relation Age of Onset  . Diabetes Mother   . Hypertension Mother   . Hearing loss Father     died in his 42's  . Cancer Father     unknown type  . Clotting disorder Father   . Heart disease Father   . Diabetes Maternal Aunt   . Hypertension Maternal Aunt   . Diabetes Maternal Uncle   . Hypertension Maternal  Uncle   . Stomach cancer Maternal Uncle   . Lung cancer Paternal Barbaraann Donald Brady     was a smoker  . Breast cancer Maternal Aunt   . Stomach cancer Maternal Uncle   . Liver disease Maternal Uncle     drinker  . Kidney disease Maternal Aunt     Social History: Social History   Social History  . Marital status: Single    Spouse name: N/A  . Number of children: 1  . Years of education: N/A   Occupational History  . Disabled- Architect   .  Other   Social History Main Topics  .  Smoking status: Former Smoker    Packs/day: 1.00    Years: 21.00    Types: Cigarettes    Quit date: 07/31/2016  . Smokeless tobacco: Never Used     Comment: Stress.None in last couple days  . Alcohol use 0.0 oz/week     Comment: weekends  . Drug use:      Comment: Marijuana.  . Sexual activity: Not Asked   Other Topics Concern  . None   Social History Narrative   Lives in [place] with his dog.  Has a 71 year old daughter that he sees daily.  Has good relationship with daughter's mom.  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     Allergies:  Allergies  Allergen Reactions  . Bee Venom Anaphylaxis  . Honey Anaphylaxis    RAW HONEY  . Shellfish Allergy Shortness Of Breath and Swelling  . Ibuprofen Hives and Swelling  . Betadine [Povidone Iodine]     BECAUSE OF SHELLFISH ALLERGY    Objective:    Vital Signs:   Temp:  [97.9 F (36.6 C)] 97.9 F (36.6 C) (01/08 2304) Pulse Rate:  [102-121] 121 (01/09 1109) Resp:  [16-35] 24 (01/09 0842) BP: (107-144)/(46-103) 139/103 (01/09 1109) SpO2:  [91 %-100 %] 94 % (01/09 1109) Weight:  [385 lb 9.6 oz (174.9 kg)-400 lb (181.4 kg)] 385 lb 9.6 oz (174.9 kg) (01/08 2348)    Weight change: Filed Weights   08/23/16 2306 08/23/16 2348  Weight: (!) 400 lb (181.4 kg) (!) 385 lb 9.6 oz (174.9 kg)    Intake/Output:   Intake/Output Summary (Last 24 hours) at 08/24/16 1152 Last data filed at 08/24/16 0926  Gross per 24 hour   Intake                0 ml  Output             2450 ml  Net            -2450 ml     Physical Exam: General:  Dyspneic at rest HEENT: normal Neck: supple. JVP to ear  . Carotids 2+ bilat; no bruits. No lymphadenopathy or thryomegaly appreciated. Cor: PMI laterally displaced. Tachy Regular rate & rhythm. No rubs, or murmurs. + S3 Lungs: clear Abdomen: obese, soft, nontender, nondistended. No hepatosplenomegaly. No bruits or masses. Good bowel sounds. Extremities: no cyanosis, clubbing, rash, R and LLE 2-3+ edema Neuro: alert & orientedx3, cranial nerves grossly intact. moves all 4 extremities w/o difficulty. Affect pleasant  Telemetry: Sinus Tach   Labs: Basic Metabolic Panel:  Recent Labs Brady 08/18/16 0342 08/19/16 0439 08/20/16 0427 08/23/16 2310 08/24/16 0734  NA 138 139 135 137 138  K 3.7 3.8 5.8* 3.2* 3.5  CL 104 104 101 99* 99*  CO2 29 28 25 26 30   GLUCOSE 108* 118* 117* 126* 134*  BUN 12 15 17 9 8   CREATININE 1.92* 1.94* 1.86* 1.96* 2.03*  CALCIUM 8.2* 8.5* 7.9* 8.2* 8.5*    Liver Function Tests: No results for input(s): AST, ALT, ALKPHOS, BILITOT, PROT, ALBUMIN in the last 168 hours. No results for input(s): LIPASE, AMYLASE in the last 168 hours. No results for input(s): AMMONIA in the last 168 hours.  CBC:  Recent Labs Brady 08/17/16 1427 08/23/16 2310  WBC 6.8 5.4  NEUTROABS 3.9  --   HGB 15.8 15.7  HCT 48.2 47.2  MCV 92.3 90.9  PLT 247 217    Cardiac Enzymes:  Recent Labs  Brady 08/24/16 0006 08/24/16 0522  TROPONINI 0.10* 0.09*    BNP: BNP (last 3 results)  Recent Labs  06/02/16 0415 08/17/16 1427 08/24/16 0006  BNP 498.9* 803.6* 915.1*    ProBNP (last 3 results) No results for input(s): PROBNP in the last 8760 hours.   CBG: No results for input(s): GLUCAP in the last 168 hours.  Coagulation Studies: No results for input(s): LABPROT, INR in the last 72 hours.  Other results: EKG: Sinus Tach 120 bpm .  Imaging: Dg Chest 2  View  Result Date: 08/23/2016 CLINICAL DATA:  Shortness of breath for 3.5 hours. Central chest pain and tightness. EXAM: CHEST  2 VIEW COMPARISON:  08/17/2016 FINDINGS: Cardiac enlargement with mild pulmonary vascular congestion. Interstitial changes suggest mild interstitial edema. Similar appearance to previous study. No focal consolidation. No blunting of costophrenic angles. No pneumothorax. Mediastinal contours appear intact. Old fracture deformity of the left clavicle. IMPRESSION: Mild congestive changes with cardiac enlargement, pulmonary vascular congestion, and mild interstitial edema similar to prior studies. Electronically Signed   By: Lucienne Capers M.D.   On: 08/23/2016 23:37      Medications:     Current Medications: . isosorbide mononitrate  30 mg Oral Daily  . mometasone-formoterol  2 puff Inhalation BID  . pantoprazole  40 mg Oral Daily  . potassium chloride  40 mEq Oral Daily  . rivaroxaban  20 mg Oral Q supper  . sodium chloride flush  3 mL Intravenous Q12H  . venlafaxine XR  75 mg Oral Q breakfast     Infusions: . sodium chloride        Assessment:   1. A/C Combined Systolic/Diastolic HF 2. AKI/CKD II- creatinine baseline 1.5 3. H/O PE 2016- chronic xarelto 4. OSA 5. Asthma 6. Factor V Leiden 7. Major Depressive Disorder 8. ETOH abuse  9. Morbid obesity 10. Noncompliance   Plan/Discussion:   Mr Laurence is a 41 year old presenting with increased dyspnea and chest pain. Admitted with A/C combined systolic/diastolic heart failure and marked volume overload. Recent ECHO earlier this month showed severely reduced LVEF ~20%. He has NICM from suspected ETOH cardiomyopathy. He has a long history of ETOH abuse and has been drinking > 1 case of beer a day for well over 1 year. LHC in 2016 showed nonobstructive CAD.  On exam he appears "warm and wet". Plan to diurese with 80 mg IV twice a day. There is some concern for Donald output with presence of S3 and worsening  renal function however will hold off on PICC line and intropes for now. Can start inotropes if he is difficult to diurese. Add Donald dose spiro. No bb for now. Continue imdur 30 mg daily. Add 12.5 mg hydralazine three times a day. Hold off on dig with elevated creatinine. Would hold off on ace/arni due to elevated creatinine. Later could consider entresto if renal function improved. Check TSH in am. HIV negative in 2017.   CEs negative. Suspect CP due to demand ischemia.   He will need close follow up in the HF clinic. I will also refer to Paramedicine through HF clinic for outpatient follow up in the community.    Length of Stay: 0  Amy Clegg NP-C  08/24/2016, 11:52 AM  Advanced Heart Failure Team Pager [phone] (M-F; 7a - 4p)  Please contact [facility] for night-coverage after hours (4p -7a ) and weekends on [email or web address]  Patient seen and examined with Darrick Grinder, NP. We discussed all aspects of the encounter.  I agree with the assessment and plan as stated above.   Morbidly obese 41 y/o male with recurrent HF and severe volume overload due to NICM. Echo reviewed personally. LVEF 20%. RV moderately HK.   Recently admitted for HF and improved with diuresis. Now with recurrent marked volume overload and possibly Donald output in setting of noncompliance.  Agree with IV diuresis. Will need aggressive titration of HF meds and likely paramedicine f/u on discharge.   Francille Wittmann,MD 2:45 PM

## 2016-08-25 ENCOUNTER — Inpatient Hospital Stay (HOSPITAL_COMMUNITY): Payer: Self-pay

## 2016-08-25 DIAGNOSIS — I130 Hypertensive heart and chronic kidney disease with heart failure and stage 1 through stage 4 chronic kidney disease, or unspecified chronic kidney disease: Principal | ICD-10-CM

## 2016-08-25 DIAGNOSIS — N189 Chronic kidney disease, unspecified: Secondary | ICD-10-CM

## 2016-08-25 DIAGNOSIS — M545 Low back pain: Secondary | ICD-10-CM

## 2016-08-25 DIAGNOSIS — D6851 Activated protein C resistance: Secondary | ICD-10-CM

## 2016-08-25 DIAGNOSIS — I5041 Acute combined systolic (congestive) and diastolic (congestive) heart failure: Secondary | ICD-10-CM

## 2016-08-25 DIAGNOSIS — I509 Heart failure, unspecified: Secondary | ICD-10-CM

## 2016-08-25 DIAGNOSIS — G4733 Obstructive sleep apnea (adult) (pediatric): Secondary | ICD-10-CM

## 2016-08-25 DIAGNOSIS — Z9989 Dependence on other enabling machines and devices: Secondary | ICD-10-CM

## 2016-08-25 LAB — CBC
HEMATOCRIT: 50 % (ref 39.0–52.0)
Hemoglobin: 15.8 g/dL (ref 13.0–17.0)
MCH: 29.3 pg (ref 26.0–34.0)
MCHC: 31.6 g/dL (ref 30.0–36.0)
MCV: 92.6 fL (ref 78.0–100.0)
Platelets: 224 10*3/uL (ref 150–400)
RBC: 5.4 MIL/uL (ref 4.22–5.81)
RDW: 14.2 % (ref 11.5–15.5)
WBC: 5.9 10*3/uL (ref 4.0–10.5)

## 2016-08-25 LAB — BASIC METABOLIC PANEL
ANION GAP: 10 (ref 5–15)
Anion gap: 8 (ref 5–15)
BUN: 15 mg/dL (ref 6–20)
BUN: 16 mg/dL (ref 6–20)
CALCIUM: 8.6 mg/dL — AB (ref 8.9–10.3)
CALCIUM: 8.6 mg/dL — AB (ref 8.9–10.3)
CO2: 29 mmol/L (ref 22–32)
CO2: 28 mmol/L (ref 22–32)
CREATININE: 2.08 mg/dL — AB (ref 0.61–1.24)
Chloride: 101 mmol/L (ref 101–111)
Chloride: 101 mmol/L (ref 101–111)
Creatinine, Ser: 2.01 mg/dL — ABNORMAL HIGH (ref 0.61–1.24)
GFR calc Af Amer: 46 mL/min — ABNORMAL LOW (ref >60)
GFR calc Af Amer: 45 mL/min — ABNORMAL LOW (ref >60)
GFR, EST NON AFRICAN AMERICAN: 40 mL/min — AB (ref >60)
GFR, EST NON AFRICAN AMERICAN: 38 mL/min — AB (ref >60)
GLUCOSE: 111 mg/dL — AB (ref 65–99)
GLUCOSE: 108 mg/dL — AB (ref 65–99)
POTASSIUM: 3.5 mmol/L (ref 3.5–5.1)
Potassium: 3.4 mmol/L — ABNORMAL LOW (ref 3.5–5.1)
SODIUM: 138 mmol/L (ref 135–145)
Sodium: 139 mmol/L (ref 135–145)

## 2016-08-25 LAB — MRSA PCR SCREENING: MRSA BY PCR: NEGATIVE

## 2016-08-25 LAB — TSH: TSH: 1.206 u[IU]/mL (ref 0.350–4.500)

## 2016-08-25 LAB — MAGNESIUM: Magnesium: 1.9 mg/dL (ref 1.7–2.4)

## 2016-08-25 MED ORDER — DIPHENHYDRAMINE HCL 25 MG PO CAPS
25.0000 mg | ORAL_CAPSULE | Freq: Four times a day (QID) | ORAL | Status: DC | PRN
  Administered 2016-08-25 – 2016-08-27 (×5): 25 mg via ORAL
  Filled 2016-08-25 (×5): qty 1

## 2016-08-25 MED ORDER — METOLAZONE 5 MG PO TABS
2.5000 mg | ORAL_TABLET | Freq: Once | ORAL | Status: AC
  Administered 2016-08-25: 2.5 mg via ORAL
  Filled 2016-08-25: qty 1

## 2016-08-25 MED ORDER — HYDRALAZINE HCL 25 MG PO TABS
25.0000 mg | ORAL_TABLET | Freq: Three times a day (TID) | ORAL | Status: DC
  Administered 2016-08-25 – 2016-08-26 (×3): 25 mg via ORAL
  Filled 2016-08-25 (×2): qty 1

## 2016-08-25 MED ORDER — POTASSIUM CHLORIDE CRYS ER 20 MEQ PO TBCR
40.0000 meq | EXTENDED_RELEASE_TABLET | Freq: Two times a day (BID) | ORAL | Status: DC
  Administered 2016-08-25 – 2016-08-27 (×5): 40 meq via ORAL
  Filled 2016-08-25 (×5): qty 2

## 2016-08-25 MED ORDER — OXYCODONE-ACETAMINOPHEN 5-325 MG PO TABS
1.0000 | ORAL_TABLET | Freq: Three times a day (TID) | ORAL | Status: DC | PRN
  Administered 2016-08-25 – 2016-08-26 (×4): 2 via ORAL
  Filled 2016-08-25 (×4): qty 2

## 2016-08-25 NOTE — Progress Notes (Signed)
Medicine attending: I examined this patient today together with resident physician Dr. Ledell Noss and I concur with her evaluation and management plan which we discussed together and will be detailed in her subsequent progress note. We appreciate prompt cardiology consultation. We will continue parenteral diuretics. Add spironolactone and low-dose hydralazine. Hold ACE for now although his creatinine has been at this level for about a year and he may still derive benefit from this medication. Continue long-acting nitrate. I would still be interested in cardiology opinion re-getting a cardiac MRI.

## 2016-08-25 NOTE — Progress Notes (Signed)
Subjective: [name] feels that his breathing has improved since beginning lasix. He had pain in the center of his back which was made better by percocet. Denies incontinence or leg or groin numbness or tingling.   Objective:  Vital signs in last 24 hours: Vitals:   08/25/16 0532 08/25/16 0927 08/25/16 1100 08/25/16 1342  BP:   (!) 104/93 (!) 124/93  Pulse: 98  (!) 107   Resp: 19     Temp:   98 F (36.7 C)   TempSrc:   Oral   SpO2: 93% 93% 98%   Weight:      Height:       Physical Exam  Constitutional: He appears well-developed and well-nourished. No distress.  Sleeping comfortably with CPAP   Cardiovascular: Normal rate and regular rhythm.   No murmur heard. 2+ bilateral lower extremity pitting edema   Pulmonary/Chest: Effort normal. No respiratory distress. He has no wheezes. He has no rales.  Abdominal: Soft. He exhibits distension. There is no tenderness. There is no guarding.  Musculoskeletal:  Lumbar spinal tenderness with paraspinal muscle spasm   Skin: Skin is warm and dry. He is not diaphoretic.     Assessment/Plan: Pt is a 41 y.o. 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   Active Problems:   Bilateral pulmonary embolism (HCC)   Long term current use of anticoagulant therapy   Severe obstructive sleep apnea   Factor 5 Leiden mutation, heterozygous (HCC)   Acute exacerbation of CHF (congestive heart failure) (HCC)   Alcohol use disorder (HCC)   CHF (congestive heart failure) (HCC)   CHF exacerbation (HCC)  Acute exacerbation of CHF  Weight improving, 173 kg today down from 180 kg on admission. Baseline weight is 170 kg. Denies chest pain or shortness of breath. Was able to walk around his room today without  difficulty breathing.  -continue IV lasix 80 mg BID  -Continue spironolactone 12.5 mg qd and hydralizine 12.5 mg TID -continue imdur 30 mg qd -holding lisinopril in the setting of AKI  -continue daily weights and strict I&Os -follow up case management consult to provide scale (they are working on [employer] application)   AKI on CKD  Crt stable at 1.9 -2up from b/l 1.5. Likely poor perfusion from fluid overload but there is also a component of CKD which may be related to his hx of longstanding uncontrolled htn.  -holding lisinopril he may not need to resume this medication if BP remains controlled with new med hydralizine - follow up BMP  -follow up 24 hour urine protein  OSA  He is in need of a CPAP machine at home. Prior sleep study 10/2013 showed multiple apneic episodes. Case management was consulted on prior admission and reached out to [facility]. [facility] stated that he would need a more recent sleep study for evaluation of OSA. Spoke with internal medicine triage today who will attempt to reach out to advanced while we attempt to order inpatient sleep study. He has a higher risk of cardiac dysfunction if his OSA continues to go untreated.  - ordered apneic monitoring   Hx of unprovoked PE  Continue xarelto 20 mg daily   Asthma  Continue home meds albuterol nebs PRN and dulera BID.  HTN  Currently normotensive  - continue home meds imdur 30 mg qd  -started Hydralazine 12.5 mg TID, and spironolactone 12.5 mg qd will continue at discharge   Back pain  New onset back pain is concerning. Lumbar xray showed no acute fracture. Has been controlled with percocet.   Dispo: Anticipated discharge in approximately 2-4 day(s).   Donald Noss, MD 08/25/2016, 5:18 PM Pager: [phone]

## 2016-08-25 NOTE — Progress Notes (Signed)
PT Cancellation Note  Patient Details Name: Donald Brady MRN: [id number] DOB: 08-17-1975   Cancelled Treatment:    Reason Eval/Treat Not Completed: PT screened, no needs identified, will sign off.   Janit Cutter LUBECK 08/25/2016, 10:27 AM

## 2016-08-25 NOTE — Progress Notes (Signed)
Pt placed on Cpap auto titrate with 3 lpm bled in oxygen. Tolerating well RT will continue to monitor.

## 2016-08-25 NOTE — Progress Notes (Signed)
Advanced Heart Failure Rounding Note  PCP: Dr. Quay Burow Primary Cardiologist: Dr. Martinique HF: Dr. Haroldine Laws   Subjective:    Good urine output so far. Drinking a lot, but trying to watch it.  Breathing a little better.   Out 2.4 L and down 3 lbs on IV lasix 80 mg BID. Labs pending today.   ECHO 08/18/2016: EF 20%  RV mildly dilated and moderately HK . Grade II DD  Objective:   Weight Range: (!) 382 lb 4.8 oz (173.4 kg) Body mass index is 47.78 kg/m.   Vital Signs:   Temp:  [97.7 F (36.5 C)-98.6 F (37 C)] 97.8 F (36.6 C) (01/10 0512) Pulse Rate:  [34-121] 98 (01/10 0532) Resp:  [18-33] 19 (01/10 0532) BP: (105-139)/(67-103) 127/98 (01/10 0512) SpO2:  [91 %-99 %] 93 % (01/10 0532) Weight:  [382 lb 4.8 oz (173.4 kg)-385 lb 6.4 oz (174.8 kg)] 382 lb 4.8 oz (173.4 kg) (01/10 0512) Last BM Date: 08/24/16  Weight change: Filed Weights   08/23/16 2348 08/24/16 2116 08/25/16 0512  Weight: (!) 385 lb 9.6 oz (174.9 kg) (!) 385 lb 6.4 oz (174.8 kg) (!) 382 lb 4.8 oz (173.4 kg)    Intake/Output:   Intake/Output Summary (Last 24 hours) at 08/25/16 0806 Last data filed at 08/25/16 0415  Gross per 24 hour  Intake              240 ml  Output             2675 ml  Net            -2435 ml     Physical Exam: General:  Morbidly obese. Dyspneic with minimal exertion.  HEENT: Normal Neck: supple. JVP elevated > jaw. Carotids 2+ bilat; no bruits. No thyromegaly or nodule noted.  Cor: PMI nondisplaced. RRR. No M/R. + S3 Lungs: Diminished basilar sounds.  Abdomen: Morbidly obese, NT, mild/mod distention, no HSM. No bruits or masses. +BS  Extremities: no cyanosis, clubbing, rash. 2-3+ BLE edema.  Neuro: alert & orientedx3, cranial nerves grossly intact. moves all 4 extremities w/o difficulty. Affect pleasant   Telemetry: Reviewed, Sinus Tach  Labs: CBC  Recent Labs  08/23/16 2310  WBC 5.4  HGB 15.7  HCT 47.2  MCV 90.9  PLT [id number]   Basic Metabolic Panel  Recent Labs  08/24/16 0734 08/24/16 1640  NA 138 137  K 3.5 3.4*  CL 99* 101  CO2 30 26  GLUCOSE 134* 150*  BUN 8 11  CREATININE 2.03* 1.97*  CALCIUM 8.5* 8.3*   Liver Function Tests No results for input(s): AST, ALT, ALKPHOS, BILITOT, PROT, ALBUMIN in the last 72 hours. No results for input(s): LIPASE, AMYLASE in the last 72 hours. Cardiac Enzymes  Recent Labs  08/24/16 0006 08/24/16 0522 08/24/16 1330  TROPONINI 0.10* 0.09* 0.10*    BNP: BNP (last 3 results)  Recent Labs  06/02/16 0415 08/17/16 1427 08/24/16 0006  BNP 498.9* 803.6* 915.1*    ProBNP (last 3 results) No results for input(s): PROBNP in the last 8760 hours.   D-Dimer No results for input(s): DDIMER in the last 72 hours. Hemoglobin A1C No results for input(s): HGBA1C in the last 72 hours. Fasting Lipid Panel No results for input(s): CHOL, HDL, LDLCALC, TRIG, CHOLHDL, LDLDIRECT in the last 72 hours. Thyroid Function Tests No results for input(s): TSH, T4TOTAL, T3FREE, THYROIDAB in the last 72 hours.  Invalid input(s): FREET3  Other results:     Imaging/Studies:  No results found.    Medications:     Scheduled Medications: . furosemide  80 mg Intravenous BID  . hydrALAZINE  12.5 mg Oral Q8H  . isosorbide mononitrate  30 mg Oral Daily  . mometasone-formoterol  2 puff Inhalation BID  . nicotine  21 mg Transdermal Daily  . pantoprazole  40 mg Oral Daily  . potassium chloride  40 mEq Oral BID  . rivaroxaban  20 mg Oral Q supper  . sodium chloride flush  3 mL Intravenous Q12H  . spironolactone  12.5 mg Oral Daily  . venlafaxine XR  75 mg Oral Q breakfast     Infusions:   PRN Medications:  sodium chloride, acetaminophen, albuterol, benzonatate, nitroGLYCERIN, ondansetron (ZOFRAN) IV, oxyCODONE-acetaminophen, sodium chloride flush   Assessment/Plan   1. A/C Combined Systolic/Diastolic HF 2. AKI/CKD II- creatinine baseline 1.5 3. H/O PE 2016- chronic xarelto 4. OSA 5. Asthma 6.  Factor V Leiden 7. Major Depressive Disorder 8. ETOH abuse  9. Morbid obesity 10. Noncompliance  Remains volume overloaded but diuresing OK and weight down. Will add metolazone today. Labs pending. Will supp K based on labs.   Continue Imdur 30 mg daily.  Increase hydralazine to 25 mg TID  Knows he needs top stop drinking.   Follow need for PICC.   Length of Stay: 1   Annamaria Helling  08/25/2016, 8:06 AM  Advanced Heart Failure Team Pager [phone] (M-F; 7a - 4p)  Please contact [facility] for night-coverage after hours (4p -7a ) and weekends on [email or web address]  Patient seen and examined with Oda Kilts, PA-C. We discussed all aspects of the encounter. I agree with the assessment and plan as stated above.   Remains volume overloaded. Will continue IV diuresis. Add metolazone. Creatinine stable. Supp K+. Discussed need to limit fluid intake. Will need to optimize HF meds. Will need paramedicine on d/c.   Bensimhon, Daniel,MD 3:07 PM

## 2016-08-25 NOTE — Progress Notes (Signed)
OT Cancellation Note  Patient Details Name: Donald Brady MRN: [id number] DOB: 04/06/1976   Cancelled Treatment:    Reason Eval/Treat Not Completed: OT screened, no needs identified, will sign off. Per PT; pt able to shower independently this AM. No acute OT needs identified; signing off at this time. Please re-consult if needs change. Thank you for this referral.  Binnie Kand M.S., OTR/L Pager: [phone]  08/25/2016, 10:40 AM

## 2016-08-25 NOTE — Progress Notes (Signed)
Patient has CPAP and states he can place self on and off. RT made pt aware that if he needed any help to call.

## 2016-08-26 ENCOUNTER — Telehealth: Payer: Self-pay | Admitting: Internal Medicine

## 2016-08-26 DIAGNOSIS — M549 Dorsalgia, unspecified: Secondary | ICD-10-CM

## 2016-08-26 LAB — BASIC METABOLIC PANEL
Anion gap: 10 (ref 5–15)
BUN: 19 mg/dL (ref 6–20)
CHLORIDE: 98 mmol/L — AB (ref 101–111)
CO2: 31 mmol/L (ref 22–32)
CREATININE: 2.17 mg/dL — AB (ref 0.61–1.24)
Calcium: 8.8 mg/dL — ABNORMAL LOW (ref 8.9–10.3)
GFR calc Af Amer: 42 mL/min — ABNORMAL LOW (ref >60)
GFR calc non Af Amer: 37 mL/min — ABNORMAL LOW (ref >60)
Glucose, Bld: 112 mg/dL — ABNORMAL HIGH (ref 65–99)
POTASSIUM: 3.7 mmol/L (ref 3.5–5.1)
SODIUM: 139 mmol/L (ref 135–145)

## 2016-08-26 LAB — CREATININE CLEARANCE, URINE, 24 HOUR
CREAT CLEAR: 24 mL/min — AB (ref 75–125)
CREATININE 24H UR: 744 mg/d — AB (ref 800–2000)
Collection Interval-CRCL: 24 hours
Creatinine, Urine: 49.59 mg/dL
URINE TOTAL VOLUME-CRCL: 1500 mL

## 2016-08-26 MED ORDER — SPIRONOLACTONE 25 MG PO TABS
25.0000 mg | ORAL_TABLET | Freq: Every day | ORAL | Status: DC
  Administered 2016-08-26 – 2016-08-27 (×2): 25 mg via ORAL
  Filled 2016-08-26 (×2): qty 1

## 2016-08-26 MED ORDER — FUROSEMIDE 10 MG/ML IJ SOLN
80.0000 mg | Freq: Two times a day (BID) | INTRAMUSCULAR | Status: DC
  Administered 2016-08-26 (×2): 80 mg via INTRAVENOUS
  Filled 2016-08-26 (×2): qty 8

## 2016-08-26 MED ORDER — FUROSEMIDE 10 MG/ML IJ SOLN: 80.0000 mg | Freq: Every day | INTRAMUSCULAR | Status: DC

## 2016-08-26 MED ORDER — ISOSORB DINITRATE-HYDRALAZINE 20-37.5 MG PO TABS
0.5000 | ORAL_TABLET | Freq: Three times a day (TID) | ORAL | Status: DC
  Administered 2016-08-26 (×2): 0.5 via ORAL
  Filled 2016-08-26 (×2): qty 1

## 2016-08-26 MED ORDER — OXYCODONE-ACETAMINOPHEN 5-325 MG PO TABS
1.0000 | ORAL_TABLET | Freq: Four times a day (QID) | ORAL | Status: DC | PRN
  Administered 2016-08-26 – 2016-08-27 (×3): 2 via ORAL
  Filled 2016-08-26 (×3): qty 2

## 2016-08-26 NOTE — Telephone Encounter (Signed)
APT. REMINDER CALL, NO ANSWER, NO VOICEMAIL °

## 2016-08-26 NOTE — Care Management Note (Addendum)
Case Management Note  Patient Details  Name: Donald Brady MRN: [id number] Date of Birth: 1976-06-15  Subjective/Objective: Pt presented for CHF. Pt is from home with family support of mother. Per pt he is not working at this time. [facility]. Per pt he gets medications via mail order via [facility]. If he needs local pharmacy he uses the [facility].                   Action/Plan: CM did receive a call from the MD in regards to a scale at home. [facility]. Scale provided to patient. Pt did state he needs a CPAP- last sleep study listed was for October 23, 2013. Pt will need a new sleep study.  CM will continue to monitor for additional needs.   Expected Discharge Date:  08/26/16               Expected Discharge Plan:  [place]  In-House Referral:  NA  Discharge planning Services  CM Consult, Medication Assistance, [facility]  Post Acute Care Choice:    Choice offered to:     DME Arranged:    DME Agency:     HH Arranged:   NA HH Agency:   NA  Status of Service:  Completed If discussed at [place] of Stay Meetings, dates discussed:    Additional Comments: 1344 08-27-16 Jacqlyn Krauss, RN,BSN [phone] CM did speak with pt in regards to CPAP and cost. Pt will not be able to pay out of pocket for CPAP. He will have to wait until Sleep study then [facility] will be able to assist with charity care. No further needs from CM at this time.    1327 08-27-16 Jacqlyn Krauss, RN,BSN [phone] [facility] did work with pt in regards to disposition needs for medications. Pharmacy at clinic assisting patient with medications. CM will send information to the [place] will not be able to have charity care for cpap until Sleep Study is done. [facility] would charge the patient 250.00 per month until sleep study is done  to get cpap. CM will relay information to patient. CM did schedule the sleep study for Oct 08, 2016 at 8:00 pm. Will place information on AVS.  Bethena Roys, RN 08/26/2016, 3:58 PM

## 2016-08-26 NOTE — Progress Notes (Signed)
Subjective: Donald Brady feels that his difficulty breathing has resolved completely. He was able to walk down the hallway without difficulty breathing yesterday. His long standing back pain has been well controlled with the percocet. He denies any new concerns or complaints.   Objective:  Vital signs in last 24 hours: Vitals:   08/26/16 0431 08/26/16 0747 08/26/16 1130 08/26/16 1135  BP: 113/86 (!) 128/100  (!) 98/51  Pulse:  (!) 105  (!) 110  Resp:  20  20  Temp: 98 F (36.7 C) 97.9 F (36.6 C)  98.5 F (36.9 C)  TempSrc: Oral Oral  Oral  SpO2: 96% 96% 95% 97%  Weight: (!) 370 lb 9.6 oz (168.1 kg)     Height: 6\' 3"  (1.905 m)      Physical Exam  Constitutional: He appears well-developed and well-nourished. No distress.  Sleeping comfortably with CPAP   Cardiovascular: Normal rate and regular rhythm.   No murmur heard. bilateral lower extremity pitting edema, improving   Pulmonary/Chest: Effort normal. No respiratory distress. He has no wheezes. He has no rales.  Abdominal: Soft. He exhibits distension. There is no tenderness. There is no guarding.  Skin: Skin is warm and dry. He is not diaphoretic.     Assessment/Plan: Pt is a 41 y.o. 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   Active Problems:   Bilateral pulmonary embolism (HCC)   Long term current use of anticoagulant therapy   Severe obstructive sleep apnea   Factor 5 Leiden mutation, heterozygous (HCC)   Acute exacerbation of CHF (congestive heart failure) (HCC)   Alcohol use disorder (HCC)   CHF (congestive heart failure) (HCC)   CHF exacerbation (HCC)   Back pain  Acute exacerbation of CHF  Weight improving, 168 kg today down from 180 kg on admission. Baseline  weight is 170 kg. Denies chest pain or shortness of breath. Lung sounds are clear but still has signs of volume overload on lower extremity exam and he has had good urine output so will continue IV diuresis. Was able to walk around the halls today without difficulty breathing.  -continue IV lasix 80 mg BID -increase spironolactone 25 mg qd -started bidil 0.5 mg TID  -holding lisinopril in the setting of AKI  -continue daily weights and strict I&Os -spoke with case management regarding providing him with a scale (they are working on [employer] application)   AKI on CKD  Crt stable at 1.9 -2 up from b/l 1.5. Likely poor perfusion from fluid overload but there is also a component of CKD which may be related to his hx of longstanding uncontrolled htn.  -holding lisinopril as BP remains controlled with spironolactone 25mg  qd  -follow up BMP  -follow up 24 hour urine protein and creatinine clearance   OSA  Not currently on CPAP at home, he has a higher risk of cardiac dysfunction if his OSA continues to go untreated and would benefit from CPAP at home.   Hx of unprovoked PE  Continue xarelto 20 mg daily   Asthma  Continue home meds albuterol nebs PRN and dulera BID.  HTN  Currently normotensive  - continue home meds imdur 30 mg qd  -started Bidil 0.5mg  TID  -continue spironolactone 25 mg   Back  pain  New onset back pain is concerning. Lumbar xray showed no acute fracture. Has been controlled with percocet.   Dispo: Anticipated discharge in approximately 2-4 day(s).   Donald Noss, MD 08/26/2016, 1:18 PM Pager: [phone]

## 2016-08-26 NOTE — Progress Notes (Signed)
Advanced Heart Failure Rounding Note  PCP: Dr. Quay Burow Primary Cardiologist: Dr. Martinique HF: Dr. Haroldine Laws   Subjective:    ECHO 08/18/2016: EF 20%  RV mildly dilated and moderately HK . Grade II DD  Feeling much better. Was able to walk halls and shower without SOB. Could not do prior to admission. No CP or palpitations. No lightheadedness or dizziness.   Collecting 24 hr urine, so output inaccurate. Weight shows down 12 lbs. Given dose of metolazone yesterday.   Creatinine 2.08 -> 2.17. K 3.7  Objective:   Weight Range: (!) 370 lb 9.6 oz (168.1 kg) Body mass index is 46.32 kg/m.   Vital Signs:   Temp:  [98 F (36.7 C)-98.8 F (37.1 C)] 98 F (36.7 C) (01/11 0431) Pulse Rate:  [94-107] 94 (01/11 0005) BP: (104-124)/(86-93) 113/86 (01/11 0431) SpO2:  [93 %-98 %] 96 % (01/11 0431) Weight:  [370 lb 9.6 oz (168.1 kg)] 370 lb 9.6 oz (168.1 kg) (01/11 0431) Last BM Date: 08/24/16  Weight change: Filed Weights   08/24/16 2116 08/25/16 0512 08/26/16 0431  Weight: (!) 385 lb 6.4 oz (174.8 kg) (!) 382 lb 4.8 oz (173.4 kg) (!) 370 lb 9.6 oz (168.1 kg)    Intake/Output:   Intake/Output Summary (Last 24 hours) at 08/26/16 0732 Last data filed at 08/26/16 0057  Gross per 24 hour  Intake             1143 ml  Output             1850 ml  Net             -707 ml     Physical Exam: General:  Morbidly obese. NAD.   HEENT: Normal Neck: supple. JVP difficult due to body habitus. Appears elevated. Carotids 2+ bilat; no bruits. No thyromegaly or nodule noted.  Cor: PMI nondisplaced. RRR. No M/R. + S3 Lungs: Slightly decreased basilar sounds.  Abdomen: Morbidly obese, NT, mildly distended at least. No HSM. No bruits or masses. +BS  Extremities: no cyanosis, clubbing, rash. Trace-1+ peripheral edema.   Neuro: alert & orientedx3, cranial nerves grossly intact. moves all 4 extremities w/o difficulty. Affect pleasant   Telemetry: Reviewed, remains in sinus tach.    Labs: CBC  Recent Labs  08/23/16 2310 08/25/16 0720  WBC 5.4 5.9  HGB 15.7 15.8  HCT 47.2 50.0  MCV 90.9 92.6  PLT 217 [id number]   Basic Metabolic Panel  Recent Labs  08/25/16 1119 08/26/16 0542  NA 139 139  K 3.4* 3.7  CL 101 98*  CO2 28 31  GLUCOSE 108* 112*  BUN 16 19  CREATININE 2.08* 2.17*  CALCIUM 8.6* 8.8*  MG 1.9  --    Liver Function Tests No results for input(s): AST, ALT, ALKPHOS, BILITOT, PROT, ALBUMIN in the last 72 hours. No results for input(s): LIPASE, AMYLASE in the last 72 hours. Cardiac Enzymes  Recent Labs  08/24/16 0006 08/24/16 0522 08/24/16 1330  TROPONINI 0.10* 0.09* 0.10*    BNP: BNP (last 3 results)  Recent Labs  06/02/16 0415 08/17/16 1427 08/24/16 0006  BNP 498.9* 803.6* 915.1*    ProBNP (last 3 results) No results for input(s): PROBNP in the last 8760 hours.   D-Dimer No results for input(s): DDIMER in the last 72 hours. Hemoglobin A1C No results for input(s): HGBA1C in the last 72 hours. Fasting Lipid Panel No results for input(s): CHOL, HDL, LDLCALC, TRIG, CHOLHDL, LDLDIRECT in the last 72 hours. Thyroid Function  Tests  Recent Labs  08/25/16 0712  TSH 1.206    Other results:     Imaging/Studies:  Dg Lumbar Spine 2-3 Views  Result Date: 08/25/2016 CLINICAL DATA:  Pt c/o mid/lower back pain for 2 days. Pt does not know of any previous injury, but states he is unsure. Pain worsening upon rolling to side. EXAM: LUMBAR SPINE - 2-3 VIEW COMPARISON:  None. FINDINGS: There is no evidence of lumbar spine fracture. Alignment is normal. Intervertebral disc spaces are maintained. IMPRESSION: No acute osseous injury of the lumbar spine. Electronically Signed   By: Kathreen Devoid   On: 08/25/2016 11:06      Medications:     Scheduled Medications: . furosemide  80 mg Intravenous Daily  . hydrALAZINE  25 mg Oral Q8H  . isosorbide mononitrate  30 mg Oral Daily  . mometasone-formoterol  2 puff Inhalation BID  .  nicotine  21 mg Transdermal Daily  . pantoprazole  40 mg Oral Daily  . potassium chloride  40 mEq Oral BID  . rivaroxaban  20 mg Oral Q supper  . sodium chloride flush  3 mL Intravenous Q12H  . spironolactone  12.5 mg Oral Daily  . venlafaxine XR  75 mg Oral Q breakfast    Infusions:   PRN Medications: sodium chloride, acetaminophen, albuterol, benzonatate, diphenhydrAMINE, nitroGLYCERIN, ondansetron (ZOFRAN) IV, oxyCODONE-acetaminophen, sodium chloride flush   Assessment/Plan   1. A/C Combined Systolic/Diastolic HF 2. AKI/CKD II- creatinine baseline 1.5 3. H/O PE 2016- chronic xarelto 4. OSA 5. Asthma 6. Factor V Leiden 7. Major Depressive Disorder 8. ETOH abuse  9. Morbid obesity 10. Noncompliance  Volume status improving. Continue IV lasix 80 mg BID. Below previously perceived baseline weight.   Creatinine relatively stable. Continue to supp K.    Transition imdur/hydral to Bidil 0.5 tab TID.   Increase spiro to 25 mg daily.   Encouraged to stop ETOH completely.   No indication for inotrope or pressor support at this time.   Length of Stay: 2  Annamaria Helling  08/26/2016, 7:32 AM  Advanced Heart Failure Team Pager [phone] (M-F; 7a - 4p)  Please contact [facility] for night-coverage after hours (4p -7a ) and weekends on [email or web address]  Patient seen and examined with Oda Kilts, PA-C. We discussed all aspects of the encounter. I agree with the assessment and plan as stated above.   Continues to improve. Nearing euvolemia. Will continue IV diuresis one more day. Start bidil. No b-blocer yet. Continue xarelto. Likely home on Saturday.   Harrell Niehoff,MD 4:15 PM

## 2016-08-26 NOTE — Progress Notes (Signed)
Pt states he will put his CPAP on when he is ready.  Pt encouraged to call if he needs assistance.

## 2016-08-27 ENCOUNTER — Other Ambulatory Visit (HOSPITAL_BASED_OUTPATIENT_CLINIC_OR_DEPARTMENT_OTHER): Payer: Self-pay

## 2016-08-27 ENCOUNTER — Ambulatory Visit: Payer: Self-pay

## 2016-08-27 ENCOUNTER — Ambulatory Visit: Payer: Self-pay | Admitting: Pharmacist

## 2016-08-27 DIAGNOSIS — G4730 Sleep apnea, unspecified: Secondary | ICD-10-CM

## 2016-08-27 DIAGNOSIS — I5021 Acute systolic (congestive) heart failure: Secondary | ICD-10-CM

## 2016-08-27 LAB — BASIC METABOLIC PANEL
Anion gap: 12 (ref 5–15)
BUN: 22 mg/dL — ABNORMAL HIGH (ref 6–20)
CHLORIDE: 99 mmol/L — AB (ref 101–111)
CO2: 27 mmol/L (ref 22–32)
CREATININE: 2.11 mg/dL — AB (ref 0.61–1.24)
Calcium: 9.1 mg/dL (ref 8.9–10.3)
GFR calc Af Amer: 44 mL/min — ABNORMAL LOW (ref >60)
GFR, EST NON AFRICAN AMERICAN: 38 mL/min — AB (ref >60)
Glucose, Bld: 98 mg/dL (ref 65–99)
POTASSIUM: 3.8 mmol/L (ref 3.5–5.1)
SODIUM: 138 mmol/L (ref 135–145)

## 2016-08-27 MED ORDER — FUROSEMIDE 40 MG PO TABS: 40.0000 mg | ORAL_TABLET | Freq: Two times a day (BID) | ORAL | 6 refills | Status: DC

## 2016-08-27 MED ORDER — ISOSORB DINITRATE-HYDRALAZINE 20-37.5 MG PO TABS: 1.0000 | ORAL_TABLET | Freq: Three times a day (TID) | ORAL | 6 refills | Status: DC

## 2016-08-27 MED ORDER — ISOSORB DINITRATE-HYDRALAZINE 20-37.5 MG PO TABS
1.0000 | ORAL_TABLET | Freq: Three times a day (TID) | ORAL | Status: DC
  Administered 2016-08-27: 1 via ORAL
  Filled 2016-08-27: qty 1

## 2016-08-27 MED ORDER — FUROSEMIDE 40 MG PO TABS
40.0000 mg | ORAL_TABLET | Freq: Two times a day (BID) | ORAL | Status: DC
  Administered 2016-08-27: 40 mg via ORAL
  Filled 2016-08-27: qty 1

## 2016-08-27 MED ORDER — DIGOXIN 125 MCG PO TABS: 0.1250 mg | ORAL_TABLET | Freq: Every day | ORAL | 6 refills | Status: AC

## 2016-08-27 MED ORDER — SPIRONOLACTONE 25 MG PO TABS: 25.0000 mg | ORAL_TABLET | Freq: Every day | ORAL | 6 refills | Status: AC

## 2016-08-27 MED ORDER — POTASSIUM CHLORIDE CRYS ER 20 MEQ PO TBCR: 20.0000 meq | EXTENDED_RELEASE_TABLET | Freq: Every day | ORAL | 6 refills | Status: DC

## 2016-08-27 MED FILL — FUROSEMIDE 40 MG TABLET: 40 | 30 days supply | Qty: 60 | Fill #0

## 2016-08-27 MED FILL — SPIRONOLACTONE 25 MG TABLET: 25 | 30 days supply | Qty: 30 | Fill #0

## 2016-08-27 MED FILL — DIGITEK 125 MCG TABLET: 125 | 30 days supply | Qty: 30 | Fill #0

## 2016-08-27 MED FILL — KLOR-CON M20 TABLET: 20 | 30 days supply | Qty: 30 | Fill #0

## 2016-08-27 MED FILL — hydrALAZINE HCL 25 MG TABS: 25 | 30 days supply | Qty: 135 | Fill #0

## 2016-08-27 NOTE — Progress Notes (Addendum)
Advanced Heart Failure Rounding Note  PCP: Dr. Quay Burow Primary Cardiologist: Dr. Martinique HF: Dr. Haroldine Laws   Subjective:    ECHO 08/18/2016: EF 20%  RV mildly dilated and moderately HK . Grade II DD  Feeling better. Walking halls,. Wants to go home. Denies dyspnea. Weight down another 6 pounds.  21 pounds total.   Creatinine 2.08 -> 2.17- > 2.11  Objective:   Weight Range: (!) 165.2 kg (364 lb 4.8 oz) Body mass index is 45.53 kg/m.   Vital Signs:   Temp:  [97.6 F (36.4 C)-98.5 F (36.9 C)] 98.4 F (36.9 C) (01/12 0739) Pulse Rate:  [98-110] 102 (01/12 0739) Resp:  [18-20] 18 (01/12 0739) BP: (98-155)/(51-108) 129/96 (01/12 0739) SpO2:  [93 %-97 %] 96 % (01/12 0739) Weight:  [165.2 kg (364 lb 4.8 oz)] 165.2 kg (364 lb 4.8 oz) (01/12 0558) Last BM Date: 08/26/16  Weight change: Filed Weights   08/25/16 0512 08/26/16 0431 08/27/16 0558  Weight: (!) 173.4 kg (382 lb 4.8 oz) (!) 168.1 kg (370 lb 9.6 oz) (!) 165.2 kg (364 lb 4.8 oz)    Intake/Output:   Intake/Output Summary (Last 24 hours) at 08/27/16 0749 Last data filed at 08/26/16 2200  Gross per 24 hour  Intake              883 ml  Output             1250 ml  Net             -367 ml     Physical Exam: General:  Morbidly obese. NAD.   HEENT: Normal Neck: supple. JVP does not appear elevated. Carotids 2+ bilat; no bruits. No thyromegaly or nodule noted.  Cor: PMI nondisplaced. RRR. No M/R. + S3 Lungs: CTAB, normal effort Abdomen: Morbidly obese, NT, ND, no HSM. No bruits or masses. +BS  Extremities: no cyanosis, clubbing, rash. Trace ankle edema.  Neuro: alert & orientedx3, cranial nerves grossly intact. moves all 4 extremities w/o difficulty. Affect pleasant  Telemetry: Reviewed, remains in S-tach 100-110s  Labs: CBC  Recent Labs  08/25/16 0720  WBC 5.9  HGB 15.8  HCT 50.0  MCV 92.6  PLT [id number]   Basic Metabolic Panel  Recent Labs  08/25/16 1119 08/26/16 0542 08/27/16 0618  NA 139 139 138  K  3.4* 3.7 3.8  CL 101 98* 99*  CO2 28 31 27   GLUCOSE 108* 112* 98  BUN 16 19 22*  CREATININE 2.08* 2.17* 2.11*  CALCIUM 8.6* 8.8* 9.1  MG 1.9  --   --    Liver Function Tests No results for input(s): AST, ALT, ALKPHOS, BILITOT, PROT, ALBUMIN in the last 72 hours. No results for input(s): LIPASE, AMYLASE in the last 72 hours. Cardiac Enzymes  Recent Labs  08/24/16 1330  TROPONINI 0.10*    BNP: BNP (last 3 results)  Recent Labs  06/02/16 0415 08/17/16 1427 08/24/16 0006  BNP 498.9* 803.6* 915.1*    ProBNP (last 3 results) No results for input(s): PROBNP in the last 8760 hours.   D-Dimer No results for input(s): DDIMER in the last 72 hours. Hemoglobin A1C No results for input(s): HGBA1C in the last 72 hours. Fasting Lipid Panel No results for input(s): CHOL, HDL, LDLCALC, TRIG, CHOLHDL, LDLDIRECT in the last 72 hours. Thyroid Function Tests  Recent Labs  08/25/16 0712  TSH 1.206    Other results:     Imaging/Studies:  No results found.    Medications:  Scheduled Medications: . isosorbide-hydrALAZINE  0.5 tablet Oral TID  . mometasone-formoterol  2 puff Inhalation BID  . nicotine  21 mg Transdermal Daily  . pantoprazole  40 mg Oral Daily  . potassium chloride  40 mEq Oral BID  . rivaroxaban  20 mg Oral Q supper  . sodium chloride flush  3 mL Intravenous Q12H  . spironolactone  25 mg Oral Daily  . venlafaxine XR  75 mg Oral Q breakfast    Infusions:   PRN Medications: sodium chloride, acetaminophen, albuterol, benzonatate, diphenhydrAMINE, nitroGLYCERIN, ondansetron (ZOFRAN) IV, oxyCODONE-acetaminophen, sodium chloride flush   Assessment/Plan   1. A/C Combined Systolic/Diastolic HF 2. AKI/CKD II- creatinine baseline 1.5 3. H/O PE 2016- chronic xarelto 4. OSA 5. Asthma 6. Factor V Leiden 7. Major Depressive Disorder 8. ETOH abuse  9. Morbid obesity 10. Noncompliance  Volume status much improved. Stop IV lasix. Transition to 40  mg BID for home.  Can take extra 40 mg in am as needed for weight gain 3 lbs overnight or 5 lbs within one weak. .   Creatinine relatively stable. Continue to supp K.    Increase Bidil to 1 tab TID. Case management to see for meds.    Continue spiro 25 mg daily.   Encouraged to stop ETOH completely.   No indication for inotrope or pressor support at this time.   OK for home today with close follow up in HF clinic next week.  HF meds for home Lasix 40 mg BID - take 80/40 if weight going up to 370 or greater. Spironolactone 25 mg daily Bidil 1 tab TID Digoxin 0.125mg  daily Xarelto 20 mg daily  Kcl 20 daily  Length of Stay: 3  Shirley Friar, PA-C  08/27/2016, 7:49 AM  Advanced Heart Failure Team Pager [phone] (M-F; 7a - 4p)  Please contact [facility] for night-coverage after hours (4p -7a ) and weekends on [email or web address]  Patient seen and examined with Oda Kilts, PA-C. We discussed all aspects of the encounter. I agree with the assessment and plan as stated above.   Much improved with diuresis. Can go home today on above meds. Reinforced need for daily weights and reviewed use of sliding scale diuretics. Scale has been provided. Will arrange paramedicine. F/u closely in HF clinic. I am hopeful he will do well.   Danijela Vessey,MD 8:35 AM

## 2016-08-27 NOTE — Discharge Summary (Signed)
Name: Donald Brady MRN: [id number] DOB: Nov 16, 1975 41 y.o. PCP: Florinda Marker, MD  Date of Admission: 08/23/2016 10:57 PM Date of Discharge: 08/27/2016 Attending Physician: Annia Belt, MD  Discharge Diagnosis: 1.   Acute exacerbation of CHF (congestive heart failure) ([place])  Discharge Medications: Allergies as of 08/27/2016      Reactions   Bee Venom Anaphylaxis   Honey Anaphylaxis   RAW HONEY   Shellfish Allergy Shortness Of Breath, Swelling   Ibuprofen Hives, Swelling   Betadine [povidone Iodine]    BECAUSE OF SHELLFISH ALLERGY      Medication List    STOP taking these medications   isosorbide mononitrate 30 MG 24 hr tablet Commonly known as:  IMDUR   lisinopril 40 MG tablet Commonly known as:  PRINIVIL,ZESTRIL     TAKE these medications   albuterol 108 (90 Base) MCG/ACT inhaler Commonly known as:  PROVENTIL HFA;VENTOLIN HFA Inhale 2 puffs into the lungs every 6 (six) hours as needed for wheezing or shortness of breath.   digoxin 0.125 MG tablet Commonly known as:  LANOXIN Take 1 tablet (0.125 mg total) by mouth daily.   furosemide 40 MG tablet Commonly known as:  LASIX Take 1 tablet (40 mg total) by mouth 2 (two) times daily. Take extra 40 mg as needed for weight gain 3 lbs overnight or 5 lbs w/in 1 week What changed:  medication strength  how much to take  how to take this  when to take this  additional instructions   isosorbide-hydrALAZINE 20-37.5 MG tablet Commonly known as:  BIDIL Take 1 tablet by mouth 3 (three) times daily.   mometasone-formoterol 200-5 MCG/ACT Aero Commonly known as:  DULERA Inhale 2 puffs into the lungs 2 (two) times daily.   potassium chloride SA 20 MEQ tablet Commonly known as:  K-DUR,KLOR-CON Take 1 tablet (20 mEq total) by mouth daily.   rivaroxaban 20 MG Tabs tablet Commonly known as:  XARELTO Take 1 tablet (20 mg total) by mouth daily with supper.   spironolactone 25 MG tablet Commonly known as:   ALDACTONE Take 1 tablet (25 mg total) by mouth daily.   venlafaxine XR 75 MG 24 hr capsule Commonly known as:  EFFEXOR-XR Take 75 mg by mouth daily with breakfast.            Durable Medical Equipment        Start     Ordered   08/27/16 1143  For home use only DME continuous positive airway pressure (CPAP)  Once    Question Answer Comment  Patient has OSA or probable OSA Yes   Settings Autotitration   CPAP supplies needed Mask, headgear, cushions, filters, heated tubing and water chamber      08/27/16 1143      Disposition and follow-up:   Donald Brady was discharged from [facility] in Good condition.  At the hospital follow up visit please address:  1.  CHF- has he been using the new scale to track his weight? Did he receive all of the new medications from mail away pharmacy?   2.  Labs / imaging needed at time of follow-up: [facility] (crt 1.9 on the day of d/c)   3.  Pending labs/ test needing follow-up: urine protein and creatinine   Follow-up Appointments: Follow-up Information    Bensimhon, Daniel, MD. Schedule an appointment as soon as possible for a visit on 09/03/2016.   Specialty:  Cardiology Why:  at 1100 for post  hospital follow up. Please bring all of your medication to your visit. The code for parking is 5000. Leisure centre manager through [employer] off of [address]. Underground garage on your right. 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 He had recently been admitted 08/18/2016 for the same symptoms and had good urine output in response to IV lasix 80 mg BID and had been diuresed to 173 kg but developed increased difficulty breathing from the  time after admission. At presentation he was 180 kg up from baseline weight 170 kg. BNP was 915, troponin 0.1>0.09>0.1. He was started on IV lasix 80 mg BID. Heart failure saw him and started spironolactone, bidil, and metolazone and increased hydralazine to 25 mg TID. He was discharged on IV lasix 40 mg BID with sliding scale 80qAM and 40qpm for weight gain>370 lbs. He was provided with a scale for home. Home med lisinopril was held in the setting of AKI. He was diuresed to 165 kg.     Bilateral pulmonary embolism ([place])   Long term current use of anticoagulant therapy Home medication xarelto 20 mg qd continued.     Severe obstructive sleep apnea Prior sleep study performed in 2015 showed multiple apneic episodes. Used CPAP during this admission. Case management made arrangements for outpatient sleep study     Alcohol use disorder ([place]) Had experienced alcohol withdrawal during hospitalization 1/2-1/5. Reported decreased alcohol consumption prior to this admission and did not go into withdrawal.    Discharge Vitals:   BP (!) 129/96 (BP Location: Left Arm)   Pulse (!) 102   Temp 98.4 F (36.9 C) (Oral)   Resp 18   Ht 6\' 3"  (1.905 m)   Wt (!) 364 lb 4.8 oz (165.2 kg)   SpO2 96%   BMI 45.53 kg/m   Pertinent Labs, Studies, and Procedures:  Procedures Performed:  Dg Chest 2 View  Result Date: 08/23/2016 CLINICAL DATA:  Shortness of breath for 3.5 hours. Central chest pain and tightness. EXAM: CHEST  2 VIEW COMPARISON:  08/17/2016 FINDINGS: Cardiac enlargement with mild pulmonary vascular congestion. Interstitial changes suggest mild interstitial edema. Similar appearance to previous study. No focal consolidation. No blunting of costophrenic angles. No pneumothorax. Mediastinal contours appear intact. Old fracture deformity of the left clavicle. IMPRESSION: Mild congestive changes with cardiac enlargement, pulmonary vascular congestion, and mild interstitial edema similar to prior studies.  Electronically Signed   By: Lucienne Capers M.D.   On: 08/23/2016 23:37   Dg Chest 2 View  Result Date: 08/17/2016 CLINICAL DATA:  Chest pain and shortness of breath for the past week. One month of cough. History of CHF and asthma. Current smoker. EXAM: CHEST  2 VIEW COMPARISON:  PA and lateral chest study of June 02, 2016 FINDINGS: The lungs are well-expanded. The interstitial markings are diffusely increased and are more conspicuous overall today. There is no alveolar infiltrate. There is no pleural effusion. The cardiac silhouette is enlarged. The central pulmonary vascularity is engorged and less distinct. The trachea is midline. The bony thorax exhibits no acute abnormality. IMPRESSION: Mild CHF with interstitial edema more conspicuous than in the past. No alveolar pneumonia. Electronically Signed   By: David  Martinique M.D.  On: 08/17/2016 15:23   Dg Lumbar Spine 2-3 Views  Result Date: 08/25/2016 CLINICAL DATA:  Pt c/o mid/lower back pain for 2 days. Pt does not know of any previous injury, but states he is unsure. Pain worsening upon rolling to side. EXAM: LUMBAR SPINE - 2-3 VIEW COMPARISON:  None. FINDINGS: There is no evidence of lumbar spine fracture. Alignment is normal. Intervertebral disc spaces are maintained. IMPRESSION: No acute osseous injury of the lumbar spine. Electronically Signed   By: Kathreen Devoid   On: 08/25/2016 11:06    Consultations: Treatment Team:  [facility], MD  Discharge Instructions: Discharge Instructions    Call MD for:  difficulty breathing, headache or visual disturbances    Complete by:  As directed    Call MD for:  extreme fatigue    Complete by:  As directed    Call MD for:  persistant nausea and vomiting    Complete by:  As directed    Diet - low sodium heart healthy    Complete by:  As directed    Increase activity slowly    Complete by:  As directed       Signed: Ledell Noss, MD 08/27/2016, 12:37 PM   Pager: [phone]

## 2016-08-27 NOTE — Progress Notes (Signed)
  Subjective: Donald Brady difficulty breathing is still resolved. He is ready to go home today. He denies any new concerns or complaints.   Objective:  Vital signs in last 24 hours: Vitals:   08/26/16 1734 08/26/16 2153 08/27/16 0558 08/27/16 0739  BP: (!) 155/78 (!) 143/108 (!) 124/94 (!) 129/96  Pulse: (!) 105 (!) 107 98 (!) 102  Resp:  20 18 18   Temp: 97.6 F (36.4 C) 97.7 F (36.5 C) 98 F (36.7 C) 98.4 F (36.9 C)  TempSrc: Axillary Oral Oral Oral  SpO2: 96% 93% 95% 96%  Weight:   (!) 364 lb 4.8 oz (165.2 kg)   Height:       Physical Exam  Constitutional: He appears well-developed and well-nourished. No distress.  Cardiovascular: Normal rate and regular rhythm.   No murmur heard. Bilateral trace lower extremity pitting edema  Pulmonary/Chest: Effort normal. No respiratory distress. He has no wheezes. He has no rales.  Abdominal: Soft. He exhibits no distension. There is no tenderness. There is no guarding.  Skin: Skin is warm and dry. He is not diaphoretic.     Assessment/Plan: Pt is a 41 y.o. 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   Active Problems:   Bilateral pulmonary embolism (HCC)   Long term current use of anticoagulant therapy   Severe obstructive sleep apnea   Factor 5 Leiden mutation, heterozygous (HCC)   Acute exacerbation of CHF (congestive heart failure) (HCC)   Alcohol use disorder (HCC)   CHF (congestive heart failure) (HCC)   CHF exacerbation (HCC)   Back pain   Acute exacerbation of CHF  Weight improving, 165 kg today down from 180 kg on admission. Baseline weight is 170 kg. Signs of volume overload have improved, trace pitting edema with clear lung sounds and improvement in his abdominal distention.   -Change IV lasix 80 mg BID to oral lasix 40 mg BID with sliding scale to 80/40 for weight >370 lbs.  -continue spironolactone 25 mg qd, bidil 0.5 mg TID, and Kcl 58meq daily -holding lisinopril in the setting of AKI  -he has been provided with a scale for home  AKI on CKD  Crt stable at 1.9 -2 up from b/l 1.5. Likely poor perfusion from fluid overload but there is also a component of CKD which may be related to his hx of longstanding uncontrolled htn. He has decreased urine creatinine clearance.  -holding lisinopril as BP remains controlled with spironolactone 25mg  qd -BMP at hospital follow up  -follow up 24 hour urine protein  OSA  Not currently on CPAP at home, he has a higher risk of cardiac dysfunction if his OSA continues to go untreated and would benefit from CPAP at home. Placed order for home CPAP, case management will arrange for outpatient sleep study.   Hx of unprovoked PE  Continue xarelto 20 mg daily   Asthma  Continue home meds albuterol nebs PRN and dulera BID.  HTN  Currently normotensive  - continue Bidil 0.5mg  TID and spironolactone 25 mg  -holding lisinopril for AKI   Dispo: Anticipated discharge in approximately 2-4 day(s).   Ledell Noss, MD 08/27/2016, 12:28 PM Pager: [phone]

## 2016-08-30 ENCOUNTER — Telehealth (HOSPITAL_COMMUNITY): Payer: Self-pay | Admitting: *Deleted

## 2016-08-30 ENCOUNTER — Emergency Department (HOSPITAL_COMMUNITY)
Admission: EM | Admit: 2016-08-30 | Discharge: 2016-08-30 | Disposition: A | Discharge status: Home / Self Care | Payer: Self-pay | Attending: Emergency Medicine | Admitting: Emergency Medicine

## 2016-08-30 ENCOUNTER — Encounter (HOSPITAL_COMMUNITY): Payer: Self-pay

## 2016-08-30 ENCOUNTER — Emergency Department (HOSPITAL_COMMUNITY): Payer: Self-pay

## 2016-08-30 DIAGNOSIS — Z7901 Long term (current) use of anticoagulants: Secondary | ICD-10-CM | POA: Insufficient documentation

## 2016-08-30 DIAGNOSIS — R0602 Shortness of breath: Secondary | ICD-10-CM

## 2016-08-30 DIAGNOSIS — M7989 Other specified soft tissue disorders: Secondary | ICD-10-CM | POA: Insufficient documentation

## 2016-08-30 DIAGNOSIS — I130 Hypertensive heart and chronic kidney disease with heart failure and stage 1 through stage 4 chronic kidney disease, or unspecified chronic kidney disease: Secondary | ICD-10-CM | POA: Insufficient documentation

## 2016-08-30 DIAGNOSIS — I509 Heart failure, unspecified: Secondary | ICD-10-CM

## 2016-08-30 DIAGNOSIS — N183 Chronic kidney disease, stage 3 (moderate): Secondary | ICD-10-CM | POA: Insufficient documentation

## 2016-08-30 DIAGNOSIS — J45909 Unspecified asthma, uncomplicated: Secondary | ICD-10-CM | POA: Insufficient documentation

## 2016-08-30 DIAGNOSIS — R140 Abdominal distension (gaseous): Secondary | ICD-10-CM | POA: Insufficient documentation

## 2016-08-30 DIAGNOSIS — Z87891 Personal history of nicotine dependence: Secondary | ICD-10-CM | POA: Insufficient documentation

## 2016-08-30 DIAGNOSIS — I5043 Acute on chronic combined systolic (congestive) and diastolic (congestive) heart failure: Secondary | ICD-10-CM | POA: Insufficient documentation

## 2016-08-30 LAB — COMPREHENSIVE METABOLIC PANEL
ALT: 42 U/L (ref 17–63)
ANION GAP: 11 (ref 5–15)
AST: 50 U/L — ABNORMAL HIGH (ref 15–41)
Albumin: 3.3 g/dL — ABNORMAL LOW (ref 3.5–5.0)
Alkaline Phosphatase: 69 U/L (ref 38–126)
BUN: 15 mg/dL (ref 6–20)
CHLORIDE: 100 mmol/L — AB (ref 101–111)
CO2: 25 mmol/L (ref 22–32)
CREATININE: 1.96 mg/dL — AB (ref 0.61–1.24)
Calcium: 9.1 mg/dL (ref 8.9–10.3)
GFR, EST AFRICAN AMERICAN: 48 mL/min — AB (ref >60)
GFR, EST NON AFRICAN AMERICAN: 41 mL/min — AB (ref >60)
Glucose, Bld: 95 mg/dL (ref 65–99)
POTASSIUM: 4.3 mmol/L (ref 3.5–5.1)
SODIUM: 136 mmol/L (ref 135–145)
Total Bilirubin: 0.5 mg/dL (ref 0.3–1.2)
Total Protein: 7.4 g/dL (ref 6.5–8.1)

## 2016-08-30 LAB — URINALYSIS, ROUTINE W REFLEX MICROSCOPIC
BILIRUBIN URINE: NEGATIVE
Bacteria, UA: NONE SEEN
GLUCOSE, UA: NEGATIVE mg/dL
HGB URINE DIPSTICK: NEGATIVE
Ketones, ur: NEGATIVE mg/dL
Leukocytes, UA: NEGATIVE
NITRITE: NEGATIVE
Protein, ur: 100 mg/dL — AB
Specific Gravity, Urine: 1.01 (ref 1.005–1.030)
Squamous Epithelial / LPF: NONE SEEN
WBC UA: NONE SEEN WBC/hpf (ref 0–5)
pH: 6 (ref 5.0–8.0)

## 2016-08-30 LAB — CBC WITH DIFFERENTIAL/PLATELET
Basophils Absolute: 0 10*3/uL (ref 0.0–0.1)
Basophils Relative: 0 %
EOS ABS: 0.2 10*3/uL (ref 0.0–0.7)
EOS PCT: 2 %
HCT: 51.3 % (ref 39.0–52.0)
Hemoglobin: 17.3 g/dL — ABNORMAL HIGH (ref 13.0–17.0)
LYMPHS ABS: 2.4 10*3/uL (ref 0.7–4.0)
LYMPHS PCT: 22 %
MCH: 30.1 pg (ref 26.0–34.0)
MCHC: 33.7 g/dL (ref 30.0–36.0)
MCV: 89.4 fL (ref 78.0–100.0)
MONO ABS: 0.9 10*3/uL (ref 0.1–1.0)
Monocytes Relative: 8 %
Neutro Abs: 7.6 10*3/uL (ref 1.7–7.7)
Neutrophils Relative %: 68 %
PLATELETS: 326 10*3/uL (ref 150–400)
RBC: 5.74 MIL/uL (ref 4.22–5.81)
RDW: 13.7 % (ref 11.5–15.5)
WBC: 11.2 10*3/uL — AB (ref 4.0–10.5)

## 2016-08-30 LAB — UIFE/LIGHT CHAINS/TP QN, 24-HR UR
% BETA, Urine: 6.8 %
ALPHA 1 URINE: 5.6 %
Albumin, U: 81.2 %
Alpha 2, Urine: 3.4 %
Free Kappa/Lambda Ratio: 7.76 (ref 2.04–10.37)
Free Lambda Lt Chains,Ur: 5.53 mg/L (ref 0.24–6.66)
Free Lt Chn Excr Rate: 42.9 mg/L — ABNORMAL HIGH (ref 1.35–24.19)
GAMMA GLOBULIN URINE: 3 %
PDF: 0
TOTAL PROTEIN, URINE-UPE24: 72.7 mg/dL
Time: 24 hours
Total Protein, Urine-Ur/day: 1091 mg/24 hr — ABNORMAL HIGH (ref 30–150)
Volume, Urine: 24 mL

## 2016-08-30 LAB — BRAIN NATRIURETIC PEPTIDE: B NATRIURETIC PEPTIDE 5: 237.9 pg/mL — AB (ref 0.0–100.0)

## 2016-08-30 MED ORDER — HYDROCODONE-ACETAMINOPHEN 5-325 MG PO TABS
2.0000 | ORAL_TABLET | Freq: Once | ORAL | Status: AC
  Administered 2016-08-30: 2 via ORAL
  Filled 2016-08-30: qty 2

## 2016-08-30 MED ORDER — FUROSEMIDE 10 MG/ML IJ SOLN
40.0000 mg | Freq: Once | INTRAMUSCULAR | Status: AC
  Administered 2016-08-30: 40 mg via INTRAVENOUS
  Filled 2016-08-30: qty 4

## 2016-08-30 NOTE — ED Notes (Signed)
Patient Alert and oriented X4. Stable and ambulatory. Patient verbalized understanding of the discharge instructions.  Patient belongings were taken by the patient.  

## 2016-08-30 NOTE — ED Notes (Signed)
ED Provider at bedside. 

## 2016-08-30 NOTE — Telephone Encounter (Signed)
Donald Brady with Paramedicine called and stated patient was having kidney pain and not urinating at all since 3am and having shortness of breath.  Patient advised per Jonni Sanger too go to the ED

## 2016-08-30 NOTE — ED Triage Notes (Signed)
Pt complaining of urinary retention. Pt states last empties bladder 4am today. Pt states burning/itching with urination. Pt states difficult to empty bladder. Pt states bilateral flank pain. Pt denies any abdominal pain or testicular pain.

## 2016-08-30 NOTE — ED Notes (Addendum)
Pt requesting pain medicine, EDP aware 

## 2016-08-30 NOTE — ED Provider Notes (Signed)
[place] [facility] DEPT Provider Note  CSN: [id number] Arrival date & time: 08/30/16  1612  History   Chief Complaint Chief Complaint  Patient presents with  . Dysuria   HPI Donald Brady is a 41 y.o. male.  The history is provided by the patient. No language interpreter was used.  Illness  This is a recurrent problem. The current episode started 2 days ago. The problem occurs constantly. The problem has been gradually worsening. Associated symptoms include shortness of breath. Pertinent negatives include no chest pain, no abdominal pain and no headaches. Exacerbated by: Lying down. The symptoms are relieved by position.    Past Medical History:  Diagnosis Date  . Anxiety   . Arthritis    left knee  . Asthma    no inhaler use in 1 year  . CHF (congestive heart failure) ([place])   . Factor V Leiden ([place])   . GERD (gastroesophageal reflux disease)   . History of pulmonary embolus (PE)    takes Xarelto  . History of seizures    no known cause, per pt. - has been "years" since last seizure; no longer on anticonvulsant  . HLD (hyperlipidemia)   . Hypertension    states BP "runs high"; has been on med. "a while"  . Lateral meniscus tear 01/2015   left knee  . Shortness of breath dyspnea   . Sleep apnea    had sleep study 10/2013:  "severe" sleep apnea, states could not afford CPAP machine   Patient Active Problem List   Diagnosis Date Noted  . Back pain   . CHF (congestive heart failure) ([place]) 08/24/2016  . CHF exacerbation ([place]) 08/24/2016  . Acute combined systolic and diastolic congestive heart failure ([place])   . Alcohol use disorder ([place])   . Acute exacerbation of CHF (congestive heart failure) ([place]) 08/17/2016  . Congestive heart failure (CHF) ([place]) 08/17/2016  . Acute on chronic systolic congestive heart failure ([place])   . Dental abscess 06/01/2016  . Morbid obesity due to excess calories ([place])   . Presbyopia 01/26/2016  . Alcohol abuse 01/26/2016  . Hypertensive heart  disease 10/01/2015  . Chronic systolic CHF (congestive heart failure) ([place]) 10/01/2015  . Umbilical hernia without obstruction and without gangrene 08/29/2015  . Pre-diabetes 08/29/2015  . CKD (chronic kidney disease), stage III 08/19/2015  . Nonobstructive cardiomyopathy ([place]) 05/05/2015  . Cocaine use 12/23/2014  . Depression 10/10/2014  . GERD (gastroesophageal reflux disease) 07/24/2014  . Polycythemia, secondary 05/23/2014  . Factor 5 Leiden mutation, heterozygous ([place]) 05/22/2014  . Hyperlipidemia 04/23/2014  . Sinusitis, chronic 04/23/2014  . Severe obstructive sleep apnea 11/20/2013  . Essential hypertension, benign 08/04/2013  . Asthma 02/20/2013  . Osteoarthritis of left knee 06/28/2012  . Long term current use of anticoagulant therapy 12/02/2011  . Bilateral pulmonary embolism ([place]) 11/27/2011  . Tobacco abuse 11/27/2011   Past Surgical History:  Procedure Laterality Date  . CARDIAC CATHETERIZATION N/A 02/19/2015   Procedure: Left Heart Cath and Coronary Angiography;  Surgeon: Peter M Martinique, MD;  Location: [facility];  Service: Cardiovascular;  Laterality: N/A;  . CHONDROPLASTY Left 03/26/2015   Procedure: CHONDROPLASTY;  Surgeon: Leandrew Koyanagi, MD;  Location: [facility];  Service: Orthopedics;  Laterality: Left;  . ESOPHAGOGASTRODUODENOSCOPY (EGD) WITH PROPOFOL  07/10/2014  . HAND SURGERY Right    states knuckle removed  . KNEE ARTHROSCOPY WITH LATERAL MENISECTOMY Left 03/26/2015   Procedure: LEFT KNEE ARTHROSCOPY WITH LATERAL MENISECTOMY  CHONDROPLASTY;  Surgeon:  Leandrew Koyanagi, MD;  Location: [facility];  Service: Orthopedics;  Laterality: Left;  . ORIF FEMUR FRACTURE Left   . VIDEO BRONCHOSCOPY Bilateral 12/06/2012   Procedure: VIDEO BRONCHOSCOPY WITHOUT FLUORO;  Surgeon: Juanito Doom, MD;  Location: [facility];  Service: Cardiopulmonary;  Laterality: Bilateral;    Home Medications    Prior to Admission medications     Medication Sig Start Date End Date Taking? Authorizing Provider  albuterol (PROVENTIL HFA;VENTOLIN HFA) 108 (90 Base) MCG/ACT inhaler Inhale 2 puffs into the lungs every 6 (six) hours as needed for wheezing or shortness of breath. 07/05/16   Florinda Marker, MD  digoxin (LANOXIN) 0.125 MG tablet Take 1 tablet (0.125 mg total) by mouth daily. 08/27/16   Shirley Friar, PA-C  furosemide (LASIX) 40 MG tablet Take 1 tablet (40 mg total) by mouth 2 (two) times daily. Take extra 40 mg as needed for weight gain 3 lbs overnight or 5 lbs w/in 1 week 08/27/16   Satira Mccallum Tillery, PA-C  isosorbide-hydrALAZINE (BIDIL) 20-37.5 MG tablet Take 1 tablet by mouth 3 (three) times daily. 08/27/16   Shirley Friar, PA-C  mometasone-formoterol (DULERA) 200-5 MCG/ACT AERO Inhale 2 puffs into the lungs 2 (two) times daily. 07/05/16   Alexa Angela Burke, MD  potassium chloride SA (K-DUR,KLOR-CON) 20 MEQ tablet Take 1 tablet (20 mEq total) by mouth daily. 08/27/16   Shirley Friar, PA-C  rivaroxaban (XARELTO) 20 MG TABS tablet Take 1 tablet (20 mg total) by mouth daily with supper. 03/18/16   Florinda Marker, MD  spironolactone (ALDACTONE) 25 MG tablet Take 1 tablet (25 mg total) by mouth daily. 08/27/16   Shirley Friar, PA-C  venlafaxine XR (EFFEXOR-XR) 75 MG 24 hr capsule Take 75 mg by mouth daily with breakfast.    Historical Provider, MD   Family History Family History  Problem Relation Age of Onset  . Diabetes Mother   . Hypertension Mother   . Hearing loss Father     died in his 65's  . Cancer Father     unknown type  . Clotting disorder Father   . Heart disease Father   . Diabetes Maternal Aunt   . Hypertension Maternal Aunt   . Diabetes Maternal Uncle   . Hypertension Maternal Uncle   . Stomach cancer Maternal Uncle   . Lung cancer Paternal Barbaraann Rondo     was a smoker  . Breast cancer Maternal Aunt   . Stomach cancer Maternal Uncle   . Liver disease Maternal Uncle     drinker  .  Kidney disease Maternal Aunt    Social History Social History  Substance Use Topics  . Smoking status: Former Smoker    Packs/day: 1.00    Years: 21.00    Types: Cigarettes    Quit date: 07/31/2016  . Smokeless tobacco: Never Used     Comment: Stress.None in last couple days  . Alcohol use 0.0 oz/week     Comment: weekends    Allergies   Bee venom; Honey; Shellfish allergy; Ibuprofen; and Betadine [povidone iodine]  Review of Systems Review of Systems  Constitutional: Negative for chills and fever.  Respiratory: Positive for shortness of breath. Negative for cough.   Cardiovascular: Positive for leg swelling. Negative for chest pain.  Gastrointestinal: Positive for abdominal distention. Negative for abdominal pain.  Neurological: Negative for headaches.  All other systems reviewed and are negative.   Physical Exam Updated Vital Signs BP 129/99  Pulse 103   Temp 98.6 F (37 C) (Oral)   Resp (!) 28   Ht 6\' 3"  (1.905 m)   Wt (!) 163.3 kg   SpO2 92%   BMI 45.00 kg/m   Physical Exam  Constitutional: He is oriented to person, place, and time. No distress.  Overweight middle-age African-American male  HENT:  Head: Normocephalic and atraumatic.  Eyes: EOM are normal. Pupils are equal, round, and reactive to light.  Neck: Normal range of motion. Neck supple.  Cardiovascular: Regular rhythm and normal heart sounds.   Pulmonary/Chest: Effort normal and breath sounds normal.  Breathing comfortably on room air without evidence of hypoxia or accessory muscle use  Abdominal: Soft. Bowel sounds are normal. He exhibits no distension. There is no tenderness.  Musculoskeletal: Normal range of motion. He exhibits edema (1+ to bilateral LE's).  Neurological: He is alert and oriented to person, place, and time.  Skin: Skin is warm and dry. Capillary refill takes less than 2 seconds. He is not diaphoretic.  Nursing note and vitals reviewed.   ED Treatments / Results  Labs (all  labs ordered are listed, but only abnormal results are displayed) Labs Reviewed  URINALYSIS, ROUTINE W REFLEX MICROSCOPIC - Abnormal; Notable for the following:       Result Value   Protein, ur 100 (*)    All other components within normal limits  CBC WITH DIFFERENTIAL/PLATELET - Abnormal; Notable for the following:    WBC 11.2 (*)    Hemoglobin 17.3 (*)    All other components within normal limits  COMPREHENSIVE METABOLIC PANEL - Abnormal; Notable for the following:    Chloride 100 (*)    Creatinine, Ser 1.96 (*)    Albumin 3.3 (*)    AST 50 (*)    GFR calc non Af Amer 41 (*)    GFR calc Af Amer 48 (*)    All other components within normal limits  BRAIN NATRIURETIC PEPTIDE - Abnormal; Notable for the following:    B Natriuretic Peptide 237.9 (*)    All other components within normal limits   EKG  EKG Interpretation None      Radiology Dg Chest 2 View  Result Date: 08/30/2016 CLINICAL DATA:  Urinary retention, bilateral flank pain EXAM: CHEST  2 VIEW COMPARISON:  08/23/2016 FINDINGS: Linear atelectasis in the left mid lung. No acute consolidation or effusion. Stable mild cardiomegaly with mild central congestion. No overt edema. No pneumothorax. IMPRESSION: 1. Mild linear atelectasis left mid lung 2. Stable mild cardiomegaly with slight central vascular congestion Electronically Signed   By: Donavan Foil M.D.   On: 08/30/2016 20:19   Procedures Procedures (including critical care time)  Medications Ordered in ED Medications  furosemide (LASIX) injection 40 mg (40 mg Intravenous Given 08/30/16 1912)  furosemide (LASIX) injection 40 mg (40 mg Intravenous Given 08/30/16 2036)  HYDROcodone-acetaminophen (NORCO/VICODIN) 5-325 MG per tablet 2 tablet (2 tablets Oral Given 08/30/16 2036)    Initial Impression / Assessment and Plan / ED Course  I have reviewed the triage vital signs and the nursing notes.  Pertinent labs & imaging results that were available during my care of the  patient were reviewed by me and considered in my medical decision making (see chart for details).  Clinical Course   41 y.o. male with above stated PMHx, HPI, and physical. History as above.  EKG showed no evidence of ST elevation/depression or T-wave inversions. UA showing no evidence of severe dehydration or blood or infection.  CBC without leukocytosis or acute anemia. CMP notable for creatinine of 1.96 (consistent with known history of CKD, at baseline unchanged). BNP mildly elevated but less than previous levels. Chest x-ray without pleural effusions or extensive pulmonary edema. Patient given 1 dose of Lasix 80 mg IV in the emergency department with large urine output. Patient feels much better and able to ambulate without evidence of increased work of breathing or hypoxia.  Laboratory and imaging results were personally reviewed by myself and used in the medical decision making of this patient's treatment and disposition.  Patient will continue on his outpatient regimen of 80 mg by mouth once daily and follow-up with his primary care physician as scheduled. Pt discharged home in stable condition. Strict ED return precautions dicussed. Pt understands and agrees with the plan and has no further questions or concerns.   Pt care discussed with and followed by my attending, Dr. Merrily Pew  Mayer Camel, MD Pager [phone]  Final Clinical Impressions(s) / ED Diagnoses   Final diagnoses:  SOB (shortness of breath)  Abdominal distension  Leg swelling  Acute on chronic congestive heart failure [facility])   New Prescriptions Discharge Medication List as of 08/30/2016  9:02 PM       Mayer Camel, MD 08/30/16 2340    Merrily Pew, MD 08/31/16 [id number]

## 2016-08-30 NOTE — ED Provider Notes (Addendum)
I saw and evaluated the patient, reviewed the resident's note and I agree with the findings and plan with the following exceptions.   Here with increasing sob, decreased UOP over last few days along with some cough, dyspnea on exertion, mild exertional CP. Also complaining of bilateral cva pain on my exam as well. Mild le edema, diffuse wheezing with fine rales as well on auscultation. Has urinaged some but apparently is not emptying bladder fully. No midline back pain to suggest cauda equina.  Plan for diureses, labs, reeval for disposition.    EKG Interpretation  Date/Time:  Monday August 30 2016 20:40:04 EST Ventricular Rate:  103 PR Interval:    QRS Duration: 81 QT Interval:  348 QTC Calculation: 456 R Axis:   -58 Text Interpretation:  Sinus tachycardia Probable left atrial enlargement Left anterior fascicular block Consider anterior infarct Confirmed by [facility] MD, Corene Cornea [phone]) on 08/31/2016 12:05:42 AM           Merrily Pew, MD 08/31/16 0005

## 2016-09-03 ENCOUNTER — Other Ambulatory Visit: Payer: Self-pay

## 2016-09-03 ENCOUNTER — Encounter (HOSPITAL_COMMUNITY): Payer: Self-pay | Admitting: Emergency Medicine

## 2016-09-03 ENCOUNTER — Ambulatory Visit (HOSPITAL_COMMUNITY)
Admit: 2016-09-03 | Discharge: 2016-09-03 | Disposition: A | Discharge status: Home / Self Care | Payer: Self-pay | Attending: Cardiology | Admitting: Cardiology

## 2016-09-03 VITALS — BP 144/102 | HR 112 | Wt 377.6 lb

## 2016-09-03 DIAGNOSIS — Z833 Family history of diabetes mellitus: Secondary | ICD-10-CM | POA: Insufficient documentation

## 2016-09-03 DIAGNOSIS — R0602 Shortness of breath: Secondary | ICD-10-CM

## 2016-09-03 DIAGNOSIS — I2699 Other pulmonary embolism without acute cor pulmonale: Secondary | ICD-10-CM

## 2016-09-03 DIAGNOSIS — F329 Major depressive disorder, single episode, unspecified: Secondary | ICD-10-CM | POA: Insufficient documentation

## 2016-09-03 DIAGNOSIS — I5022 Chronic systolic (congestive) heart failure: Secondary | ICD-10-CM

## 2016-09-03 DIAGNOSIS — Z5321 Procedure and treatment not carried out due to patient leaving prior to being seen by health care provider: Secondary | ICD-10-CM | POA: Insufficient documentation

## 2016-09-03 DIAGNOSIS — Z79899 Other long term (current) drug therapy: Secondary | ICD-10-CM | POA: Insufficient documentation

## 2016-09-03 DIAGNOSIS — N183 Chronic kidney disease, stage 3 unspecified: Secondary | ICD-10-CM

## 2016-09-03 DIAGNOSIS — Z801 Family history of malignant neoplasm of trachea, bronchus and lung: Secondary | ICD-10-CM | POA: Insufficient documentation

## 2016-09-03 DIAGNOSIS — Z803 Family history of malignant neoplasm of breast: Secondary | ICD-10-CM | POA: Insufficient documentation

## 2016-09-03 DIAGNOSIS — R079 Chest pain, unspecified: Secondary | ICD-10-CM | POA: Insufficient documentation

## 2016-09-03 DIAGNOSIS — I429 Cardiomyopathy, unspecified: Secondary | ICD-10-CM | POA: Insufficient documentation

## 2016-09-03 DIAGNOSIS — F17210 Nicotine dependence, cigarettes, uncomplicated: Secondary | ICD-10-CM | POA: Insufficient documentation

## 2016-09-03 DIAGNOSIS — D6851 Activated protein C resistance: Secondary | ICD-10-CM | POA: Insufficient documentation

## 2016-09-03 DIAGNOSIS — J45909 Unspecified asthma, uncomplicated: Secondary | ICD-10-CM | POA: Insufficient documentation

## 2016-09-03 DIAGNOSIS — Z800 Family history of malignant neoplasm of digestive organs: Secondary | ICD-10-CM | POA: Insufficient documentation

## 2016-09-03 DIAGNOSIS — I110 Hypertensive heart disease with heart failure: Secondary | ICD-10-CM

## 2016-09-03 DIAGNOSIS — Z7901 Long term (current) use of anticoagulants: Secondary | ICD-10-CM | POA: Insufficient documentation

## 2016-09-03 DIAGNOSIS — I10 Essential (primary) hypertension: Secondary | ICD-10-CM

## 2016-09-03 DIAGNOSIS — K219 Gastro-esophageal reflux disease without esophagitis: Secondary | ICD-10-CM | POA: Insufficient documentation

## 2016-09-03 DIAGNOSIS — F1010 Alcohol abuse, uncomplicated: Secondary | ICD-10-CM | POA: Insufficient documentation

## 2016-09-03 DIAGNOSIS — I2510 Atherosclerotic heart disease of native coronary artery without angina pectoris: Secondary | ICD-10-CM | POA: Insufficient documentation

## 2016-09-03 DIAGNOSIS — Z720 Tobacco use: Secondary | ICD-10-CM

## 2016-09-03 DIAGNOSIS — G4733 Obstructive sleep apnea (adult) (pediatric): Secondary | ICD-10-CM | POA: Insufficient documentation

## 2016-09-03 DIAGNOSIS — Z8051 Family history of malignant neoplasm of kidney: Secondary | ICD-10-CM | POA: Insufficient documentation

## 2016-09-03 DIAGNOSIS — Z86711 Personal history of pulmonary embolism: Secondary | ICD-10-CM | POA: Insufficient documentation

## 2016-09-03 DIAGNOSIS — Z8249 Family history of ischemic heart disease and other diseases of the circulatory system: Secondary | ICD-10-CM | POA: Insufficient documentation

## 2016-09-03 DIAGNOSIS — F419 Anxiety disorder, unspecified: Secondary | ICD-10-CM | POA: Insufficient documentation

## 2016-09-03 DIAGNOSIS — E785 Hyperlipidemia, unspecified: Secondary | ICD-10-CM | POA: Insufficient documentation

## 2016-09-03 DIAGNOSIS — I5040 Unspecified combined systolic (congestive) and diastolic (congestive) heart failure: Secondary | ICD-10-CM | POA: Insufficient documentation

## 2016-09-03 DIAGNOSIS — I130 Hypertensive heart and chronic kidney disease with heart failure and stage 1 through stage 4 chronic kidney disease, or unspecified chronic kidney disease: Secondary | ICD-10-CM | POA: Insufficient documentation

## 2016-09-03 LAB — BASIC METABOLIC PANEL
Anion gap: 9 (ref 5–15)
Anion gap: 10 (ref 5–15)
BUN: 16 mg/dL (ref 6–20)
BUN: 17 mg/dL (ref 6–20)
CALCIUM: 8.8 mg/dL — AB (ref 8.9–10.3)
CHLORIDE: 108 mmol/L (ref 101–111)
CO2: 23 mmol/L (ref 22–32)
CO2: 22 mmol/L (ref 22–32)
CREATININE: 1.65 mg/dL — AB (ref 0.61–1.24)
CREATININE: 1.85 mg/dL — AB (ref 0.61–1.24)
Calcium: 9.2 mg/dL (ref 8.9–10.3)
Chloride: 105 mmol/L (ref 101–111)
GFR calc Af Amer: 51 mL/min — ABNORMAL LOW (ref >60)
GFR, EST AFRICAN AMERICAN: 59 mL/min — AB (ref >60)
GFR, EST NON AFRICAN AMERICAN: 51 mL/min — AB (ref >60)
GFR, EST NON AFRICAN AMERICAN: 44 mL/min — AB (ref >60)
Glucose, Bld: 105 mg/dL — ABNORMAL HIGH (ref 65–99)
Glucose, Bld: 144 mg/dL — ABNORMAL HIGH (ref 65–99)
Potassium: 4.4 mmol/L (ref 3.5–5.1)
Potassium: 4.1 mmol/L (ref 3.5–5.1)
SODIUM: 140 mmol/L (ref 135–145)
SODIUM: 137 mmol/L (ref 135–145)

## 2016-09-03 LAB — I-STAT TROPONIN, ED: TROPONIN I, POC: 0.05 ng/mL (ref 0.00–0.08)

## 2016-09-03 LAB — CBC
HCT: 50.4 % (ref 39.0–52.0)
Hemoglobin: 16.7 g/dL (ref 13.0–17.0)
MCH: 29.7 pg (ref 26.0–34.0)
MCHC: 33.1 g/dL (ref 30.0–36.0)
MCV: 89.7 fL (ref 78.0–100.0)
PLATELETS: 333 10*3/uL (ref 150–400)
RBC: 5.62 MIL/uL (ref 4.22–5.81)
RDW: 13.9 % (ref 11.5–15.5)
WBC: 10.7 10*3/uL — AB (ref 4.0–10.5)

## 2016-09-03 LAB — BRAIN NATRIURETIC PEPTIDE: B NATRIURETIC PEPTIDE 5: 698.2 pg/mL — AB (ref 0.0–100.0)

## 2016-09-03 MED ORDER — POTASSIUM CHLORIDE CRYS ER 20 MEQ PO TBCR: 20.0000 meq | EXTENDED_RELEASE_TABLET | Freq: Two times a day (BID) | ORAL | 6 refills | Status: DC

## 2016-09-03 MED ORDER — METOLAZONE 2.5 MG PO TABS: 2.5000 mg | ORAL_TABLET | Freq: Every day | ORAL | 2 refills | Status: DC | PRN

## 2016-09-03 MED ORDER — FUROSEMIDE 80 MG PO TABS: 80.0000 mg | ORAL_TABLET | Freq: Two times a day (BID) | ORAL | 3 refills | Status: DC

## 2016-09-03 MED FILL — metOLazone 2.5 MG TABS: 2.5 | 10 days supply | Qty: 10 | Fill #0

## 2016-09-03 NOTE — ED Triage Notes (Signed)
Pt presents to ED for assessment of centralized chest pain with no radiation.  Pt sts nausea and 1 episode of emesis since chest pain started approx 3 hours ago.  C/o SOB, denies diaphoresis.  Pt has an extensive cardiac hx including CHF and MI.

## 2016-09-03 NOTE — Patient Instructions (Addendum)
Routine lab work today. Will notify you of abnormal results, otherwise no news is good news!  Take Metolazone 2.5 mg tablet once TODAY AND TOMORROW. Then, take once daily as needed for swelling/weight gain.  INCREASE Lasix to 80 mg twice daily. Can "double up" on 40 mg tablets you currently have (take 2 tabs twice daily). New Rx has been sent to [facility] med Assist for 80 mg tablets (take 1 tab twice daily).  INCREASE Potassium to 20 meq (1 tab) TWICE DAILY.  Follow up next Tuesday with Oda Kilts PA-C.  Do the following things EVERYDAY: 1) Weigh yourself in the morning before breakfast. Write it down and keep it in a log. 2) Take your medicines as prescribed 3) Eat low salt foods-Limit salt (sodium) to 2000 mg per day.  4) Stay as active as you can everyday 5) Limit all fluids for the day to less than 2 liters

## 2016-09-03 NOTE — Progress Notes (Signed)
PCP: Dr Quay Burow  Primary Cardiologist: Dr Martinique  HF MD: Dr Haroldine Laws   HPI: Donald Brady is a 41 year old with a history of chronic systolic heart failure (suspected ETOH), NICM. PE 2016, factor V leiden, asthma, htn, osa, ckd stage III, major depression disorder, and ETOH abuse. LHC 2016 non obstructive cad. No family history of heart disease.   Hospitalized in July 0000000 with A/C systolic heart failure. Left AMA. Admitted January 2nd with A/C systolic heart failure. Diuresed with IV lasix and transitioned to lasix 40 mg daily. Discharge weight was  383 pounds.  Readmitted January 8th, 2018 with increased dyspnea and marked volume overload. Diuresed with IV lasix and started on dig, spiro, and bidil. Discharge 364 pounds.   Evaluated in [facility] ED January 15th due to difficulty urinating. Given IV lasix. UA was negative for UTI.   Today he returns for HF post hospital follow up. Overall feeling fair. SOB with exertion. Sleeps sitting up right. Weight at home trending up 360 to 370 pounds. Says he took one extra lasix this week.Says he is drinking at least 3 beers 3-4 times a week.  Says he has a hard time not drinking alcohol. Tries to limit fluid intake but did not realize beer was included in fluid intake.  Smoking 1 pack of cigarettes per week.   Stopped using cocaine 1 year ago. Taking all medications. Lives alone. Trying to obtain Medicaid.    Review of Systems  HENT: Negative.   Eyes: Negative.   Respiratory: Positive for cough and shortness of breath.   Cardiovascular: Positive for orthopnea, leg swelling and PND.  Gastrointestinal: Negative.   Genitourinary: Negative.   Skin: Negative.   Neurological: Positive for weakness.  Endo/Heme/Allergies: Negative.   Psychiatric/Behavioral: Positive for depression and substance abuse.    ECHO 08/18/2016: EF 20%  RV mildly dilated and moderately HK . Grade II DD  LHC 02/2015:  1. Nonobstructive CAD with marked ectasia in the dominant LCx and in the  RCA 2. Moderate LV dysfunction   SH:  Social History   Social History  . Marital status: Single    Spouse name: N/A  . Number of children: 1  . Years of education: N/A   Occupational History  . Disabled- Architect   .  Other   Social History Main Topics  . Smoking status: Former Smoker    Packs/day: 1.00    Years: 21.00    Types: Cigarettes    Quit date: 07/31/2016  . Smokeless tobacco: Never Used     Comment: Stress.None in last couple days  . Alcohol use 0.0 oz/week     Comment: weekends  . Drug use: Yes     Comment: Marijuana.  . Sexual activity: Not on file   Other Topics Concern  . Not on file   Social History Narrative   Lives in [place] with his dog.  Has a 38 year old daughter that he sees daily.  Has good relationship with daughter's mom.  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     FH:  Family History  Problem Relation Age of Onset  . Diabetes Mother   . Hypertension Mother   . Hearing loss Father     died in his 77's  . Cancer Father     unknown type  . Clotting disorder Father   . Heart disease Father   . Diabetes Maternal Aunt   . Hypertension Maternal Aunt   .  Diabetes Maternal Uncle   . Hypertension Maternal Uncle   . Stomach cancer Maternal Uncle   . Lung cancer Paternal Barbaraann Rondo     was a smoker  . Breast cancer Maternal Aunt   . Stomach cancer Maternal Uncle   . Liver disease Maternal Uncle     drinker  . Kidney disease Maternal Aunt     Past Medical History:  Diagnosis Date  . Anxiety   . Arthritis    left knee  . Asthma    no inhaler use in 1 year  . CHF (congestive heart failure) ([place])   . Factor V Leiden ([place])   . GERD (gastroesophageal reflux disease)   . History of pulmonary embolus (PE)    takes Xarelto  . History of seizures    no known cause, per pt. - has been "years" since last seizure; no longer on anticonvulsant  . HLD (hyperlipidemia)   . Hypertension    states BP "runs high";  has been on med. "a while"  . Lateral meniscus tear 01/2015   left knee  . Shortness of breath dyspnea   . Sleep apnea    had sleep study 10/2013:  "severe" sleep apnea, states could not afford CPAP machine    Current Outpatient Prescriptions  Medication Sig Dispense Refill  . albuterol (PROVENTIL HFA;VENTOLIN HFA) 108 (90 Base) MCG/ACT inhaler Inhale 2 puffs into the lungs every 6 (six) hours as needed for wheezing or shortness of breath. 6.7 g 3  . digoxin (LANOXIN) 0.125 MG tablet Take 1 tablet (0.125 mg total) by mouth daily. 30 tablet 6  . furosemide (LASIX) 40 MG tablet Take 1 tablet (40 mg total) by mouth 2 (two) times daily. Take extra 40 mg as needed for weight gain 3 lbs overnight or 5 lbs w/in 1 week 60 tablet 6  . isosorbide-hydrALAZINE (BIDIL) 20-37.5 MG tablet Take 1 tablet by mouth 3 (three) times daily. 90 tablet 6  . potassium chloride SA (K-DUR,KLOR-CON) 20 MEQ tablet Take 1 tablet (20 mEq total) by mouth daily. 30 tablet 6  . rivaroxaban (XARELTO) 20 MG TABS tablet Take 1 tablet (20 mg total) by mouth daily with supper. 90 tablet 3  . spironolactone (ALDACTONE) 25 MG tablet Take 1 tablet (25 mg total) by mouth daily. 30 tablet 6  . venlafaxine XR (EFFEXOR-XR) 75 MG 24 hr capsule Take 75 mg by mouth daily with breakfast.    . atorvastatin (LIPITOR) 40 MG tablet   0  . mometasone-formoterol (DULERA) 200-5 MCG/ACT AERO Inhale 2 puffs into the lungs 2 (two) times daily. 8.8 g 3   No current facility-administered medications for this encounter.     Vitals:   09/03/16 1056  BP: (!) 144/102  Pulse: (!) 112  SpO2: 93%  Weight: (!) 377 lb 9.6 oz (171.3 kg)   Filed Weights   09/03/16 1056  Weight: (!) 377 lb 9.6 oz (171.3 kg)   PHYSICAL EXAM:  General:  Dyspneic walking in the clinic.  HEENT: normal Neck: supple. JVP to jaw. Carotids 2+ bilaterally; no bruits. No lymphadenopathy or thryomegaly appreciated. Cor: PMI normal. Regular rate & rhythm. No rubs, or murmurs. +  S3  Lungs: clear Abdomen: obese, soft, nontender, nondistended. No hepatosplenomegaly. No bruits or masses. Good bowel sounds. Extremities: no cyanosis, clubbing, rash,  R and LLE 1-2+ edema.  Neuro: alert & orientedx3, cranial nerves grossly intact. Moves all 4 extremities w/o difficulty. Affect pleasant.      ASSESSMENT & PLAN: 1. Combined  Systolic/Diastolic HF- NICM , LHC [id number] Non obs CAD. ECHO EF 20% RV moderate HK. Reduced EF likely from ETOH abuse and HTN.  NYHA IIIB. Volume status elevated in the setting of increased fluid intake. Weight up over 10 pounds since discharge.  Increase lasix to 80 mg twice a day and he was instructed to take 2.5 mg metolazone for the next 2 days. Increase potassium to 20 meq twice a day.  Continue spiro 25 mg daily.  No BB for now due to volume over load. Continue digoxin. Marland Kitchen  He is unsure of the bidil dose at home. Can consider bb next week if volume status improved. May need to switch to torsemide.  Reinforced daily weights, low salt food choices, and limiting fluid intake to < 2 liters per day.  Plan to repeat ECHO in 3 months after HF meds optimized.   2. CKD II- creatinine baseline 1.5- check bmet today.  3. H/O PE 2016- chronic xarelto. No bleeding problems  4. OSA- Reinforced nightly use of CPAP.  5. Asthma 6. Factor V Leiden 7. Major Depressive Disorder 8. ETOH abuse- Needs to stop all together. May need rehab. Next week he will meet with HF SW.   9. Morbid obesity- Discussed portion control. Needs to lose considerable amount of weight.  10. Smoker - Encouraged to stop smoking. He declines smoking cessation.  11. HTN- Elevated today. Diurese and adjust medications next week.   Continue paramedicine. Follow up weekly for 3 weeks. Next week he see HFSW. He is at high risk for readmit due to poor insight. I have asked him to bring all medications next visit.   Amy Clegg NP-C  12:51 PM

## 2016-09-04 ENCOUNTER — Emergency Department (HOSPITAL_COMMUNITY)
Admission: EM | Admit: 2016-09-04 | Discharge: 2016-09-04 | Disposition: A | Discharge status: Home / Self Care | Payer: Self-pay | Attending: Emergency Medicine | Admitting: Emergency Medicine

## 2016-09-04 LAB — BRAIN NATRIURETIC PEPTIDE: B Natriuretic Peptide: 579.1 pg/mL — ABNORMAL HIGH (ref 0.0–100.0)

## 2016-09-04 NOTE — ED Notes (Signed)
Pt stated he did not want to wait any longer and that his Chest Pain has gone away. Pt is going to follow up with his cardiologist. Noted to the pt to come back if the chest pain comes back.

## 2016-09-07 ENCOUNTER — Encounter (HOSPITAL_COMMUNITY): Payer: Self-pay

## 2016-09-07 ENCOUNTER — Ambulatory Visit (HOSPITAL_COMMUNITY)
Admission: RE | Admit: 2016-09-07 | Discharge: 2016-09-07 | Disposition: A | Discharge status: Home / Self Care | Payer: Self-pay | Source: Ambulatory Visit | Attending: Cardiology | Admitting: Cardiology

## 2016-09-07 VITALS — BP 128/76 | HR 98 | Wt 359.5 lb

## 2016-09-07 DIAGNOSIS — K219 Gastro-esophageal reflux disease without esophagitis: Secondary | ICD-10-CM | POA: Insufficient documentation

## 2016-09-07 DIAGNOSIS — F329 Major depressive disorder, single episode, unspecified: Secondary | ICD-10-CM | POA: Insufficient documentation

## 2016-09-07 DIAGNOSIS — I10 Essential (primary) hypertension: Secondary | ICD-10-CM

## 2016-09-07 DIAGNOSIS — Z800 Family history of malignant neoplasm of digestive organs: Secondary | ICD-10-CM | POA: Insufficient documentation

## 2016-09-07 DIAGNOSIS — I429 Cardiomyopathy, unspecified: Secondary | ICD-10-CM | POA: Insufficient documentation

## 2016-09-07 DIAGNOSIS — D6851 Activated protein C resistance: Secondary | ICD-10-CM | POA: Insufficient documentation

## 2016-09-07 DIAGNOSIS — Z833 Family history of diabetes mellitus: Secondary | ICD-10-CM | POA: Insufficient documentation

## 2016-09-07 DIAGNOSIS — N183 Chronic kidney disease, stage 3 unspecified: Secondary | ICD-10-CM

## 2016-09-07 DIAGNOSIS — F1010 Alcohol abuse, uncomplicated: Secondary | ICD-10-CM | POA: Insufficient documentation

## 2016-09-07 DIAGNOSIS — F419 Anxiety disorder, unspecified: Secondary | ICD-10-CM | POA: Insufficient documentation

## 2016-09-07 DIAGNOSIS — Z7901 Long term (current) use of anticoagulants: Secondary | ICD-10-CM | POA: Insufficient documentation

## 2016-09-07 DIAGNOSIS — I5040 Unspecified combined systolic (congestive) and diastolic (congestive) heart failure: Secondary | ICD-10-CM | POA: Insufficient documentation

## 2016-09-07 DIAGNOSIS — Z720 Tobacco use: Secondary | ICD-10-CM

## 2016-09-07 DIAGNOSIS — Z86711 Personal history of pulmonary embolism: Secondary | ICD-10-CM | POA: Insufficient documentation

## 2016-09-07 DIAGNOSIS — J45909 Unspecified asthma, uncomplicated: Secondary | ICD-10-CM | POA: Insufficient documentation

## 2016-09-07 DIAGNOSIS — I5022 Chronic systolic (congestive) heart failure: Secondary | ICD-10-CM

## 2016-09-07 DIAGNOSIS — I428 Other cardiomyopathies: Secondary | ICD-10-CM

## 2016-09-07 DIAGNOSIS — F1290 Cannabis use, unspecified, uncomplicated: Secondary | ICD-10-CM | POA: Insufficient documentation

## 2016-09-07 DIAGNOSIS — Z79899 Other long term (current) drug therapy: Secondary | ICD-10-CM | POA: Insufficient documentation

## 2016-09-07 DIAGNOSIS — G4733 Obstructive sleep apnea (adult) (pediatric): Secondary | ICD-10-CM | POA: Insufficient documentation

## 2016-09-07 DIAGNOSIS — I130 Hypertensive heart and chronic kidney disease with heart failure and stage 1 through stage 4 chronic kidney disease, or unspecified chronic kidney disease: Secondary | ICD-10-CM | POA: Insufficient documentation

## 2016-09-07 DIAGNOSIS — Z803 Family history of malignant neoplasm of breast: Secondary | ICD-10-CM | POA: Insufficient documentation

## 2016-09-07 DIAGNOSIS — I2510 Atherosclerotic heart disease of native coronary artery without angina pectoris: Secondary | ICD-10-CM | POA: Insufficient documentation

## 2016-09-07 DIAGNOSIS — E785 Hyperlipidemia, unspecified: Secondary | ICD-10-CM | POA: Insufficient documentation

## 2016-09-07 DIAGNOSIS — F17210 Nicotine dependence, cigarettes, uncomplicated: Secondary | ICD-10-CM | POA: Insufficient documentation

## 2016-09-07 LAB — BASIC METABOLIC PANEL
ANION GAP: 9 (ref 5–15)
BUN: 24 mg/dL — ABNORMAL HIGH (ref 6–20)
CHLORIDE: 100 mmol/L — AB (ref 101–111)
CO2: 26 mmol/L (ref 22–32)
Calcium: 9.6 mg/dL (ref 8.9–10.3)
Creatinine, Ser: 2.17 mg/dL — ABNORMAL HIGH (ref 0.61–1.24)
GFR calc Af Amer: 42 mL/min — ABNORMAL LOW (ref >60)
GFR, EST NON AFRICAN AMERICAN: 37 mL/min — AB (ref >60)
GLUCOSE: 108 mg/dL — AB (ref 65–99)
POTASSIUM: 4.1 mmol/L (ref 3.5–5.1)
Sodium: 135 mmol/L (ref 135–145)

## 2016-09-07 LAB — BRAIN NATRIURETIC PEPTIDE: B Natriuretic Peptide: 144.9 pg/mL — ABNORMAL HIGH (ref 0.0–100.0)

## 2016-09-07 MED ORDER — CARVEDILOL 3.125 MG PO TABS: 3.1250 mg | ORAL_TABLET | Freq: Two times a day (BID) | ORAL | 2 refills | Status: DC

## 2016-09-07 MED FILL — CARVEDILOL 3.125 MG TABLET: 3.125 | 30 days supply | Qty: 60 | Fill #0

## 2016-09-07 NOTE — Patient Instructions (Addendum)
START Carvedilol (Coreg) 3.125 mg tablet twice daily. This has been sent to [facility]. This medication will be covered 100% of cost under the [facility] for a 30 day supply with 2 refills.  Routine lab work today. Will notify you of abnormal results, otherwise no news is good news!  Follow up next week with Amy Clegg NP-C.  Follow up 2 weeks with Oda Kilts PA-C.  Do the following things EVERYDAY: 1) Weigh yourself in the morning before breakfast. Write it down and keep it in a log. 2) Take your medicines as prescribed 3) Eat low salt foods-Limit salt (sodium) to 2000 mg per day.  4) Stay as active as you can everyday 5) Limit all fluids for the day to less than 2 liters

## 2016-09-07 NOTE — Progress Notes (Signed)
SW Intern met with pt and discussed his interaction with paramedicine. Pt indicated he lives alone but recently his mother has "basically moved in" and is helping provide care and support. Pt indicates he has a pending medicaid app and is currently using the [facility] card. Pt has no difficulty obtaining medications and takes them as prescribed, pt indicates overall he feels good. Pt is following a low salt diet and says he is eating "boring ol food" but is following nutrition and fluid guidelines. Pt stepdad is his main source of transportation and takes him to and from appts. Pt has appropriate knowledge of when to contact HF Clinic with weight changes. Pt asked SW Intern for a Pill box, cutter and bag for medications. SW Intern provided these items and told pt he could contact SW if any new needs arose.Pt appears to be in good spirits and is functioning well with added support from mother and paramedicine.  SW Intern will continue to coordinate with Paramedicine and provide support. SW Intern [place], [place], [place]

## 2016-09-07 NOTE — Progress Notes (Signed)
PCP: Dr Quay Burow  Primary Cardiologist: Dr Martinique  HF MD: Dr Haroldine Laws   HPI: [name] is a 41 year old with a history of chronic systolic heart failure (suspected ETOH), NICM. PE 2016, factor V leiden, asthma, htn, osa, ckd stage III, major depression disorder, and ETOH abuse. LHC 2016 non obstructive cad. No family history of heart disease.   Hospitalized in July 0000000 with A/C systolic heart failure. Left AMA. Admitted January 2nd with A/C systolic heart failure. Diuresed with IV lasix and transitioned to lasix 40 mg daily. Discharge weight was  383 pounds.  Readmitted January 8th, 2018 with increased dyspnea and marked volume overload. Diuresed with IV lasix and started on dig, spiro, and bidil. Discharge 364 pounds.   Evaluated in [facility] ED January 15th due to difficulty urinating. Given IV lasix. UA was negative for UTI.   He presents today for follow up. Took extra metolazone after visit last week. Last took one yesterday morning ( took 4 instead of 2 days) Weight down from 370 to 359. No longer orthopneic and can no longer getting with walking.  No DOE of note since he was seen last week. Last beer was Thursday.  Has a hard time not drinking. Smoking ~1 pack per week. Has been over a year since he did cocaine.  Smoke marijuana occassionally. Gets all meds paid for.     Review of Systems  HENT: Negative.   Eyes: Negative.   Gastrointestinal: Negative.   Genitourinary: Negative.   Skin: Negative.   Endo/Heme/Allergies: Negative.     ECHO 08/18/2016: EF 20%  RV mildly dilated and moderately HK . Grade II DD  LHC 02/2015:  1. 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 Moderate LV dysfunction   SH:  Social History   Social History  . Marital status: Single    Spouse name: N/A  . Number of children: 1  . Years of education: N/A   Occupational History  . Disabled- Architect   .  Other   Social History Main Topics  . Smoking status: Former  Smoker    Packs/day: 1.00    Years: 21.00    Types: Cigarettes    Quit date: 07/31/2016  . Smokeless tobacco: Never Used     Comment: Stress.None in last couple days  . Alcohol use 0.0 oz/week     Comment: everyday  . Drug use: Yes     Comment: Marijuana.  . Sexual activity: Not on file   Other Topics Concern  . Not on file   Social History Narrative   Lives in [place] with his dog.  Has a 68 year old daughter that he sees daily.  Has good relationship with daughter's mom.  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     FH:  Family History  Problem Relation Age of Onset  . Diabetes Mother   . Hypertension Mother   . Hearing loss Father     died in his 25's  . Cancer Father     unknown type  . Clotting disorder Father   . Heart disease Father   . Diabetes Maternal Aunt   . Hypertension Maternal Aunt   . Diabetes Maternal Uncle   . Hypertension Maternal Uncle   . Stomach cancer Maternal Uncle   . Lung cancer Paternal Barbaraann Rondo     was a smoker  . Breast cancer Maternal Aunt   . Stomach cancer  Maternal Uncle   . Liver disease Maternal Uncle     drinker  . Kidney disease Maternal Aunt     Past Medical History:  Diagnosis Date  . Anxiety   . Arthritis    left knee  . Asthma    no inhaler use in 1 year  . CHF (congestive heart failure) ([place])   . Factor V Leiden ([place])   . GERD (gastroesophageal reflux disease)   . History of pulmonary embolus (PE)    takes Xarelto  . History of seizures    no known cause, per pt. - has been "years" since last seizure; no longer on anticonvulsant  . HLD (hyperlipidemia)   . Hypertension    states BP "runs high"; has been on med. "a while"  . Lateral meniscus tear 01/2015   left knee  . Shortness of breath dyspnea   . Sleep apnea    had sleep study 10/2013:  "severe" sleep apnea, states could not afford CPAP machine    Current Outpatient Prescriptions  Medication Sig Dispense Refill  . acetaminophen  (TYLENOL) 325 MG tablet Take 650 mg by mouth every 6 (six) hours as needed for mild pain.    Marland Kitchen albuterol (PROVENTIL HFA;VENTOLIN HFA) 108 (90 Base) MCG/ACT inhaler Inhale 2 puffs into the lungs every 6 (six) hours as needed for wheezing or shortness of breath. 6.7 g 3  . atorvastatin (LIPITOR) 40 MG tablet   0  . digoxin (LANOXIN) 0.125 MG tablet Take 1 tablet (0.125 mg total) by mouth daily. 30 tablet 6  . furosemide (LASIX) 80 MG tablet Take 1 tablet (80 mg total) by mouth 2 (two) times daily. 60 tablet 3  . hydrALAZINE (APRESOLINE) 25 MG tablet Take 37.5 mg by mouth 3 (three) times daily.    . isosorbide mononitrate (IMDUR) 30 MG 24 hr tablet Take 30 mg by mouth 3 (three) times daily.    . metolazone (ZAROXOLYN) 2.5 MG tablet Take 1 tablet (2.5 mg total) by mouth daily as needed. 10 tablet 2  . mometasone-formoterol (DULERA) 200-5 MCG/ACT AERO Inhale 2 puffs into the lungs 2 (two) times daily. 8.8 g 3  . Multiple Vitamin (MULTIVITAMIN) tablet Take 1 tablet by mouth daily.    . potassium chloride SA (K-DUR,KLOR-CON) 20 MEQ tablet Take 1 tablet (20 mEq total) by mouth 2 (two) times daily. 60 tablet 6  . rivaroxaban (XARELTO) 20 MG TABS tablet Take 1 tablet (20 mg total) by mouth daily with supper. 90 tablet 3  . spironolactone (ALDACTONE) 25 MG tablet Take 1 tablet (25 mg total) by mouth daily. 30 tablet 6  . venlafaxine XR (EFFEXOR-XR) 75 MG 24 hr capsule Take 75 mg by mouth daily with breakfast.     No current facility-administered medications for this encounter.     Vitals:   09/07/16 0905  BP: 128/76  Pulse: 98  SpO2: 97%  Weight: (!) 359 lb 8 oz (163.1 kg)   Wt Readings from Last 3 Encounters:  09/07/16 (!) 359 lb 8 oz (163.1 kg)  09/03/16 (!) 377 lb 9.6 oz (171.3 kg)  08/30/16 (!) 360 lb (163.3 kg)     PHYSICAL EXAM:  General:  Obese. NAD HEENT: Normal Neck: supple. JVP 8-9 cm. Carotids 2+ bilaterally; no bruits. No thyromegaly or nodule noted.  Cor: PMI normal. RRR. No  rubs or murmurs. + S3  Lungs: CTAB, normal effort Abdomen: obese, soft, NT, ND, no HSM. No bruits or masses. +BS  Extremities: no cyanosis, clubbing, rash,  No peripheral edema.  Neuro: alert & orientedx3, cranial nerves grossly intact. Moves all 4 extremities w/o difficulty. Affect pleasant.  ASSESSMENT & PLAN: 1. Combined Systolic/Diastolic HF- NICM , LHC Q000111Q Non obs CAD. ECHO EF 20% RV moderate HK. Reduced EF likely from ETOH abuse and HTN.  - NYHA II-III with impressive diuresis overnight.  - Volume status much improved with use of metolazone.  - Continue lasix 80 mg BID. Check BMET/BNP today.  - Continue potassium 20 meq twice a day.  - Continue spiro 25 mg daily.  - Continue hydralazine 37.5 mg TID.  - Reduce imdur to ONCE daily. Had been taking 30 mg TID.  - Will add coreg 3.125 mg BID with improvement of volume status.  Reinforced daily weights, low salt food choices, and limiting fluid intake to < 2 liters per day.  Plan to repeat ECHO in 3 months after HF meds optimized.   2. CKD II - Baseline 1.5. BMET today.   3. H/O PE 2016- chronic xarelto. No bleeding problems. No change.  4. OSA- Continue nightly CPAP.  5. Asthma 6. Factor V Leiden 7. Major Depressive Disorder 8. ETOH abuse - Encouraged to stop all together.  - Saw HFSW today.    9. Morbid obesity - Needs to reduce portions and increase activity as able.   10. Smoker - Encouraged to stop smoking. He declines smoking cessation.  11. HTN - Improved with diuresis. Meds as above.   Continue paramedicine.  Labs today. Meds as above.  Keep weekly follow up x 2 weeks at least.   Shirley Friar, PA-C  9:37 AM  Total time spent > 25 minutes. Over half that spent discussing the above.

## 2016-09-13 ENCOUNTER — Encounter (HOSPITAL_COMMUNITY): Payer: Self-pay | Admitting: Adult Health

## 2016-09-13 NOTE — Progress Notes (Signed)
   Called by Joellen Jersey with Paramedicine.   Mr Radice has gained 7 pounds.   Instructed to take metolazone today and follow up on Friday of this week to assess.   Darrick Grinder NP-C 11:39 AM'

## 2016-09-16 ENCOUNTER — Ambulatory Visit (HOSPITAL_COMMUNITY)
Admission: RE | Admit: 2016-09-16 | Discharge: 2016-09-16 | Disposition: A | Discharge status: Home / Self Care | Payer: Self-pay | Source: Ambulatory Visit | Attending: Internal Medicine | Admitting: Internal Medicine

## 2016-09-16 ENCOUNTER — Encounter (HOSPITAL_COMMUNITY): Payer: Self-pay

## 2016-09-16 ENCOUNTER — Encounter: Payer: Self-pay | Admitting: Internal Medicine

## 2016-09-16 VITALS — BP 124/90 | HR 107 | Wt 367.6 lb

## 2016-09-16 DIAGNOSIS — I5043 Acute on chronic combined systolic (congestive) and diastolic (congestive) heart failure: Secondary | ICD-10-CM

## 2016-09-16 DIAGNOSIS — K219 Gastro-esophageal reflux disease without esophagitis: Secondary | ICD-10-CM | POA: Insufficient documentation

## 2016-09-16 DIAGNOSIS — I5040 Unspecified combined systolic (congestive) and diastolic (congestive) heart failure: Secondary | ICD-10-CM | POA: Insufficient documentation

## 2016-09-16 DIAGNOSIS — I2510 Atherosclerotic heart disease of native coronary artery without angina pectoris: Secondary | ICD-10-CM | POA: Insufficient documentation

## 2016-09-16 DIAGNOSIS — I130 Hypertensive heart and chronic kidney disease with heart failure and stage 1 through stage 4 chronic kidney disease, or unspecified chronic kidney disease: Secondary | ICD-10-CM | POA: Insufficient documentation

## 2016-09-16 DIAGNOSIS — I5022 Chronic systolic (congestive) heart failure: Secondary | ICD-10-CM

## 2016-09-16 DIAGNOSIS — F419 Anxiety disorder, unspecified: Secondary | ICD-10-CM | POA: Insufficient documentation

## 2016-09-16 DIAGNOSIS — Z801 Family history of malignant neoplasm of trachea, bronchus and lung: Secondary | ICD-10-CM | POA: Insufficient documentation

## 2016-09-16 DIAGNOSIS — D6851 Activated protein C resistance: Secondary | ICD-10-CM | POA: Insufficient documentation

## 2016-09-16 DIAGNOSIS — Z8249 Family history of ischemic heart disease and other diseases of the circulatory system: Secondary | ICD-10-CM | POA: Insufficient documentation

## 2016-09-16 DIAGNOSIS — J45909 Unspecified asthma, uncomplicated: Secondary | ICD-10-CM | POA: Insufficient documentation

## 2016-09-16 DIAGNOSIS — G4733 Obstructive sleep apnea (adult) (pediatric): Secondary | ICD-10-CM | POA: Insufficient documentation

## 2016-09-16 DIAGNOSIS — F1010 Alcohol abuse, uncomplicated: Secondary | ICD-10-CM | POA: Insufficient documentation

## 2016-09-16 DIAGNOSIS — Z720 Tobacco use: Secondary | ICD-10-CM

## 2016-09-16 DIAGNOSIS — Z833 Family history of diabetes mellitus: Secondary | ICD-10-CM | POA: Insufficient documentation

## 2016-09-16 DIAGNOSIS — N183 Chronic kidney disease, stage 3 unspecified: Secondary | ICD-10-CM

## 2016-09-16 DIAGNOSIS — I110 Hypertensive heart disease with heart failure: Secondary | ICD-10-CM

## 2016-09-16 DIAGNOSIS — Z79899 Other long term (current) drug therapy: Secondary | ICD-10-CM | POA: Insufficient documentation

## 2016-09-16 DIAGNOSIS — Z7901 Long term (current) use of anticoagulants: Secondary | ICD-10-CM | POA: Insufficient documentation

## 2016-09-16 DIAGNOSIS — R0602 Shortness of breath: Secondary | ICD-10-CM

## 2016-09-16 DIAGNOSIS — F329 Major depressive disorder, single episode, unspecified: Secondary | ICD-10-CM | POA: Insufficient documentation

## 2016-09-16 DIAGNOSIS — F17210 Nicotine dependence, cigarettes, uncomplicated: Secondary | ICD-10-CM | POA: Insufficient documentation

## 2016-09-16 DIAGNOSIS — E785 Hyperlipidemia, unspecified: Secondary | ICD-10-CM | POA: Insufficient documentation

## 2016-09-16 DIAGNOSIS — Z86711 Personal history of pulmonary embolism: Secondary | ICD-10-CM | POA: Insufficient documentation

## 2016-09-16 DIAGNOSIS — I429 Cardiomyopathy, unspecified: Secondary | ICD-10-CM | POA: Insufficient documentation

## 2016-09-16 DIAGNOSIS — Z800 Family history of malignant neoplasm of digestive organs: Secondary | ICD-10-CM | POA: Insufficient documentation

## 2016-09-16 LAB — BASIC METABOLIC PANEL
Anion gap: 12 (ref 5–15)
BUN: 24 mg/dL — AB (ref 6–20)
CO2: 24 mmol/L (ref 22–32)
CREATININE: 1.89 mg/dL — AB (ref 0.61–1.24)
Calcium: 9.8 mg/dL (ref 8.9–10.3)
Chloride: 99 mmol/L — ABNORMAL LOW (ref 101–111)
GFR, EST AFRICAN AMERICAN: 50 mL/min — AB (ref >60)
GFR, EST NON AFRICAN AMERICAN: 43 mL/min — AB (ref >60)
Glucose, Bld: 136 mg/dL — ABNORMAL HIGH (ref 65–99)
POTASSIUM: 4 mmol/L (ref 3.5–5.1)
SODIUM: 135 mmol/L (ref 135–145)

## 2016-09-16 LAB — BRAIN NATRIURETIC PEPTIDE: B NATRIURETIC PEPTIDE 5: 145.1 pg/mL — AB (ref 0.0–100.0)

## 2016-09-16 MED ORDER — METOLAZONE 2.5 MG PO TABS
2.5000 mg | ORAL_TABLET | ORAL | 2 refills | Status: DC
Start: 2016-09-17 — End: 2016-09-24

## 2016-09-16 MED ORDER — POTASSIUM CHLORIDE CRYS ER 20 MEQ PO TBCR
40.0000 meq | EXTENDED_RELEASE_TABLET | Freq: Once | ORAL | Status: AC
  Administered 2016-09-16: 40 meq via ORAL
  Filled 2016-09-16: qty 2

## 2016-09-16 MED ORDER — FUROSEMIDE 10 MG/ML IJ SOLN
80.0000 mg | Freq: Once | INTRAMUSCULAR | Status: AC
Start: 2016-09-16 — End: 2016-09-16
  Administered 2016-09-16: 80 mg via INTRAVENOUS
  Filled 2016-09-16: qty 8

## 2016-09-16 NOTE — Progress Notes (Signed)
Advanced Heart Failure Medication Review by a Pharmacist  Does the patient  feel that his/her medications are working for him/her?  no  Has the patient been experiencing any side effects to the medications prescribed?  no  Does the patient measure his/her own blood pressure or blood glucose at home?  yes   Does the patient have any problems obtaining medications due to transportation or finances?   no  Understanding of regimen: good Understanding of indications: good Potential of compliance: good Patient understands to avoid NSAIDs. Patient understands to avoid decongestants.  Issues to address at subsequent visits: None   Pharmacist comments:  Donald Brady is a pleasant 41 yo M presenting with his medication bottles. He reports good compliance with his regimen but states that his weight continues to trend up despite taking metolazone on Monday and Tuesday. He did not have any other medication-related questions or concerns for me at this time.   [name]. Velva Harman, PharmD, BCPS, CPP Clinical Pharmacist Pager: [phone] Phone: [phone] 09/16/2016 9:39 AM      Time with patient: 10 minutes Preparation and documentation time: 2 minutes Total time: 12 minutes

## 2016-09-16 NOTE — Progress Notes (Signed)
CSW met with patient in the clinic. Patient is known to paramedicine program and reports long history of substance abuse. Patient reports he has no current income and pending disability application. Patient is currently on the [facility] card. Patient states he became "sick" 5 years ago and had to stop working in [employer]. He reports he drank 24 cans of beer a day plus hard alcohol for the past two years. Although after recent hospitalization reduced from 24 beers to 2 beers. He states "I got over cocaine use a year ago and I can do this". Patient states he "likes to do it myself" and denies any assistance with referrals for program support. CSW discussed triggers and coping skills through recovery. Patient states he misses his 32 yo daughter whose whereabouts are unknown. He reports the mother of his child took her away and he has no contact. Patient identified multiple support systems with his family, friends and Church members. He noted [place]  Patient states he is motivated to stop drinking for his health and "tired of being sick and having to come to the clinic and other MD's so much". CSW provided supportive intervention and discussed coping techniques to assist with recovery process. Patient denies any referrals at this time but will call CSW if needed. CSW discussed continued paramedicine program support and coordination with HF clinic staff. Patient agreeable and stated he likes the support form the paramedic. CSW will continue to follow for support and interventions as needed. Raquel Sarna, [place], [place]

## 2016-09-16 NOTE — Progress Notes (Addendum)
PCP: Dr Quay Burow  Primary Cardiologist: Dr Martinique  HF MD: Dr Haroldine Laws   HPI: Donald Brady is a 41 year old with a history of chronic systolic heart failure (suspected ETOH), NICM. PE 2016, factor V leiden, asthma, htn, osa, ckd stage III, major depression disorder, and ETOH abuse. LHC 2016 non obstructive cad. No family history of heart disease.   Hospitalized in July 0000000 with A/C systolic heart failure. Left AMA. Admitted January 2nd with A/C systolic heart failure. Diuresed with IV lasix and transitioned to lasix 40 mg daily. Discharge weight was  383 pounds.  Readmitted January 8th, 2018 with increased dyspnea and marked volume overload. Diuresed with IV lasix and started on dig, spiro, and bidil. Discharge 364 pounds.   Evaluated in [facility] ED January 15th due to difficulty urinating. Given IV lasix. UA was negative for UTI.   He returns for follow up. Overall feeling fair. Drinking > 2 liters per day. Drinking 24 ounces of beer 3 days. Says he feels terrible if he cant drink alcohol.  SOB with exertion. + Orthopnea. + PND. Weight at home continues to rise. Weight at home trending up despite metolazone earlier this week. Weight at home up to 367 pounds. Tries to eat low salt diet. Taking all medications.    Review of Systems  Constitutional: Positive for malaise/fatigue.  HENT: Negative.   Eyes: Negative.   Respiratory: Positive for cough.   Cardiovascular: Positive for orthopnea and leg swelling.  Gastrointestinal: Negative.   Genitourinary: Negative.   Musculoskeletal: Negative.   Skin: Negative.   Neurological: Negative.   Endo/Heme/Allergies: Negative.   Psychiatric/Behavioral: Positive for depression and substance abuse.    ECHO 08/18/2016: EF 20%  RV mildly dilated and moderately HK . Grade II DD  LHC 02/2015:  1. 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 Moderate LV dysfunction   SH:  Social History   Social History  . Marital status:  Single    Spouse name: N/A  . Number of children: 1  . Years of education: N/A   Occupational History  . Disabled- Architect   .  Other   Social History Main Topics  . Smoking status: Former Smoker    Packs/day: 1.00    Years: 21.00    Types: Cigarettes    Quit date: 07/31/2016  . Smokeless tobacco: Never Used     Comment: Stress.None in last couple days  . Alcohol use 0.0 oz/week     Comment: everyday  . Drug use: Yes     Comment: Marijuana.  . Sexual activity: Not on file   Other Topics Concern  . Not on file   Social History Narrative   Lives in [place] with his dog.  Has a 30 year old daughter that he sees daily.  Has good relationship with daughter's mom.  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     FH:  Family History  Problem Relation Age of Onset  . Diabetes Mother   . Hypertension Mother   . Hearing loss Father     died in his 25's  . Cancer Father     unknown type  . Clotting disorder Father   . Heart disease Father   . Diabetes Maternal Aunt   . Hypertension Maternal Aunt   . Diabetes Maternal Uncle   . Hypertension Maternal Uncle   . Stomach cancer Maternal Uncle   . Lung cancer Paternal Uncle  was a smoker  . Breast cancer Maternal Aunt   . Stomach cancer Maternal Uncle   . Liver disease Maternal Uncle     drinker  . Kidney disease Maternal Aunt     Past Medical History:  Diagnosis Date  . Anxiety   . Arthritis    left knee  . Asthma    no inhaler use in 1 year  . CHF (congestive heart failure) ([place])   . Factor V Leiden ([place])   . GERD (gastroesophageal reflux disease)   . History of pulmonary embolus (PE)    takes Xarelto  . History of seizures    no known cause, per pt. - has been "years" since last seizure; no longer on anticonvulsant  . HLD (hyperlipidemia)   . Hypertension    states BP "runs high"; has been on med. "a while"  . Lateral meniscus tear 01/2015   left knee  . Shortness of breath  dyspnea   . Sleep apnea    had sleep study 10/2013:  "severe" sleep apnea, states could not afford CPAP machine    Current Outpatient Prescriptions  Medication Sig Dispense Refill  . acetaminophen (TYLENOL) 325 MG tablet Take 650 mg by mouth every 6 (six) hours as needed for mild pain.    Marland Kitchen albuterol (PROVENTIL HFA;VENTOLIN HFA) 108 (90 Base) MCG/ACT inhaler Inhale 2 puffs into the lungs every 6 (six) hours as needed for wheezing or shortness of breath. 6.7 g 3  . atorvastatin (LIPITOR) 40 MG tablet Take 40 mg by mouth daily.   0  . carvedilol (COREG) 3.125 MG tablet Take 1 tablet (3.125 mg total) by mouth 2 (two) times daily. 60 tablet 2  . digoxin (LANOXIN) 0.125 MG tablet Take 1 tablet (0.125 mg total) by mouth daily. 30 tablet 6  . folic acid (FOLVITE) 1 MG tablet Take 1 mg by mouth daily.    . furosemide (LASIX) 80 MG tablet Take 1 tablet (80 mg total) by mouth 2 (two) times daily. 60 tablet 3  . hydrALAZINE (APRESOLINE) 25 MG tablet Take 37.5 mg by mouth 3 (three) times daily.    . isosorbide mononitrate (IMDUR) 30 MG 24 hr tablet Take 30 mg by mouth 3 (three) times daily.    . metolazone (ZAROXOLYN) 2.5 MG tablet Take 1 tablet (2.5 mg total) by mouth daily as needed. 10 tablet 2  . mometasone-formoterol (DULERA) 200-5 MCG/ACT AERO Inhale 2 puffs into the lungs 2 (two) times daily. 8.8 g 3  . Multiple Vitamin (MULTIVITAMIN) tablet Take 1 tablet by mouth daily.    . potassium chloride SA (K-DUR,KLOR-CON) 20 MEQ tablet Take 1 tablet (20 mEq total) by mouth 2 (two) times daily. 60 tablet 6  . rivaroxaban (XARELTO) 20 MG TABS tablet Take 1 tablet (20 mg total) by mouth daily with supper. 90 tablet 3  . spironolactone (ALDACTONE) 25 MG tablet Take 1 tablet (25 mg total) by mouth daily. 30 tablet 6  . venlafaxine XR (EFFEXOR-XR) 75 MG 24 hr capsule Take 75 mg by mouth daily with breakfast.     No current facility-administered medications for this encounter.     Vitals:   09/16/16 0925   BP: 124/90  Pulse: (!) 107  SpO2: 94%  Weight: (!) 367 lb 9.6 oz (166.7 kg)   Wt Readings from Last 3 Encounters:  09/16/16 (!) 367 lb 9.6 oz (166.7 kg)  09/07/16 (!) 359 lb 8 oz (163.1 kg)  09/03/16 (!) 377 lb 9.6 oz (171.3 kg)  PHYSICAL EXAM:  General:  Obese. Mild dyspnea with exertion.  HEENT: Normal Neck: supple. JVP to jaw cm. Carotids 2+ bilaterally; no bruits. No thyromegaly or nodule noted.  Cor: PMI normal. RRR. No rubs or murmurs. + S3  Lungs: CTAB Abdomen: obese, NT, ND. no HSM. No bruits or masses. +BS  Extremities: no cyanosis, clubbing, rash,  R and LLE 2+ edema. Warm  Neuro: alert & orientedx3, cranial nerves grossly intact. Moves all 4 extremities w/o difficulty. Affect pleasant.  ASSESSMENT & PLAN: 1. Acute/ChronicCombined Systolic/Diastolic HF- NICM , LHC [id number] Non obs CAD. ECHO EF 20% RV moderate HK. Reduced EF likely from ETOH abuse and HTN.  - NYHA IIIB. Volume status elevated.  Given 80 mg IV lasix + 40 meq potassium in the clinic. 800 cc urine output. Saline lock removed from Left AC. Check BMET and BNP now.  - Continue lasix 80 mg BID. Add  Metolazone 2.5 mg Tues/Thur/Sat - Continue potassium 20 meq twice a day.  - Continue spiro 25 mg daily.  - Continue hydralazine 37.5 mg TID.  - Continue Imdur  30 mg daily.   - Continue coreg 3.125 mg BID . Will not increase with volume overload.  Reinforced daily weights, low salt food choices, and limiting fluid intake to < 2 liters per day.  Plan to repeat ECHO in 3 months after HF meds optimized.   2. CKD II - Baseline 1.5. BMET today.   3. H/O PE 2016- chronic xarelto. No bleeding problems. No change.  4. OSA- Continue nightly CPAP.  5. Asthma 6. Factor V Leiden 7. Major Depressive Disorder 8. ETOH abuse - Ongoing alcohol abuse. Needs rehab but says he want to hold off.  HFSW to follow up with him today.     9. Morbid obesity - Needs to reduce portions and increase activity as able.   10. Smoker - He  declines smoking cessation.   11. HTN Elevated but improving.    Continue paramedicine.  Follow up next week. To reassess volume status. BMET today ok.     Darrick Grinder, NP  9:33 AM

## 2016-09-16 NOTE — Progress Notes (Signed)
Error see note. I was unable to erase noted. See my progress note at 72.      Review of Systems  HENT: Negative.   Eyes: Negative.   Gastrointestinal: Negative.   Genitourinary: Negative.   Skin: Negative.   Endo/Heme/Allergies: Negative.    Amy Clegg 1:41 PM

## 2016-09-16 NOTE — Patient Instructions (Addendum)
CHANGE Metolazone to three times per week on Tuesdays, Thursdays, and Saturdays  Labs today We will only contact you if something comes back abnormal or we need to make some changes. Otherwise no news is good news!   Your physician recommends that you schedule a follow-up appointment in: 1 week with Oda Kilts, PA-C  Do the following things EVERYDAY: 1) Weigh yourself in the morning before breakfast. Write it down and keep it in a log. 2) Take your medicines as prescribed 3) Eat low salt foods-Limit salt (sodium) to 2000 mg per day.  4) Stay as active as you can everyday 5) Limit all fluids for the day to less than 2 liters

## 2016-09-21 ENCOUNTER — Ambulatory Visit: Payer: Self-pay

## 2016-09-21 ENCOUNTER — Inpatient Hospital Stay (HOSPITAL_COMMUNITY): Admission: RE | Admit: 2016-09-21 | Payer: Self-pay | Source: Ambulatory Visit

## 2016-09-21 ENCOUNTER — Other Ambulatory Visit: Payer: Self-pay | Admitting: Internal Medicine

## 2016-09-21 MED FILL — SPIRONOLACTONE 25 MG TABLET: 25 | 30 days supply | Qty: 30 | Fill #1

## 2016-09-21 MED FILL — DIGITEK 125 MCG TABLET: 125 | 30 days supply | Qty: 30 | Fill #1

## 2016-09-22 MED FILL — FOLIC ACID 1 MG TABLET: 1 | 30 days supply | Qty: 30 | Fill #0

## 2016-09-22 MED FILL — hydrALAZINE HCL 25 MG TABS: 25 | 30 days supply | Qty: 135 | Fill #0

## 2016-09-23 ENCOUNTER — Encounter: Payer: Self-pay | Admitting: Sports Medicine

## 2016-09-23 ENCOUNTER — Ambulatory Visit (INDEPENDENT_AMBULATORY_CARE_PROVIDER_SITE_OTHER): Payer: Self-pay | Admitting: Sports Medicine

## 2016-09-23 VITALS — BP 132/90 | HR 106 | Ht 75.0 in | Wt 360.0 lb

## 2016-09-23 DIAGNOSIS — M25562 Pain in left knee: Secondary | ICD-10-CM

## 2016-09-23 DIAGNOSIS — M170 Bilateral primary osteoarthritis of knee: Secondary | ICD-10-CM

## 2016-09-23 DIAGNOSIS — G8929 Other chronic pain: Secondary | ICD-10-CM

## 2016-09-23 MED ORDER — METHYLPREDNISOLONE ACETATE 40 MG/ML IJ SUSP
40.0000 mg | Freq: Once | INTRAMUSCULAR | Status: AC
  Administered 2016-09-23: 40 mg via INTRA_ARTICULAR

## 2016-09-24 ENCOUNTER — Encounter (HOSPITAL_COMMUNITY): Payer: Self-pay

## 2016-09-24 ENCOUNTER — Ambulatory Visit (HOSPITAL_COMMUNITY)
Admission: RE | Admit: 2016-09-24 | Discharge: 2016-09-24 | Disposition: A | Discharge status: Home / Self Care | Payer: Self-pay | Source: Ambulatory Visit | Attending: Cardiology | Admitting: Cardiology

## 2016-09-24 VITALS — BP 110/86 | HR 103 | Wt 359.0 lb

## 2016-09-24 DIAGNOSIS — Z8249 Family history of ischemic heart disease and other diseases of the circulatory system: Secondary | ICD-10-CM | POA: Insufficient documentation

## 2016-09-24 DIAGNOSIS — Z803 Family history of malignant neoplasm of breast: Secondary | ICD-10-CM | POA: Insufficient documentation

## 2016-09-24 DIAGNOSIS — F329 Major depressive disorder, single episode, unspecified: Secondary | ICD-10-CM | POA: Insufficient documentation

## 2016-09-24 DIAGNOSIS — D6851 Activated protein C resistance: Secondary | ICD-10-CM | POA: Insufficient documentation

## 2016-09-24 DIAGNOSIS — F419 Anxiety disorder, unspecified: Secondary | ICD-10-CM | POA: Insufficient documentation

## 2016-09-24 DIAGNOSIS — E785 Hyperlipidemia, unspecified: Secondary | ICD-10-CM | POA: Insufficient documentation

## 2016-09-24 DIAGNOSIS — Z79899 Other long term (current) drug therapy: Secondary | ICD-10-CM | POA: Insufficient documentation

## 2016-09-24 DIAGNOSIS — I110 Hypertensive heart disease with heart failure: Secondary | ICD-10-CM

## 2016-09-24 DIAGNOSIS — F17210 Nicotine dependence, cigarettes, uncomplicated: Secondary | ICD-10-CM | POA: Insufficient documentation

## 2016-09-24 DIAGNOSIS — I10 Essential (primary) hypertension: Secondary | ICD-10-CM

## 2016-09-24 DIAGNOSIS — N183 Chronic kidney disease, stage 3 unspecified: Secondary | ICD-10-CM

## 2016-09-24 DIAGNOSIS — I5022 Chronic systolic (congestive) heart failure: Secondary | ICD-10-CM

## 2016-09-24 DIAGNOSIS — I429 Cardiomyopathy, unspecified: Secondary | ICD-10-CM | POA: Insufficient documentation

## 2016-09-24 DIAGNOSIS — Z833 Family history of diabetes mellitus: Secondary | ICD-10-CM | POA: Insufficient documentation

## 2016-09-24 DIAGNOSIS — G4733 Obstructive sleep apnea (adult) (pediatric): Secondary | ICD-10-CM | POA: Insufficient documentation

## 2016-09-24 DIAGNOSIS — K219 Gastro-esophageal reflux disease without esophagitis: Secondary | ICD-10-CM | POA: Insufficient documentation

## 2016-09-24 DIAGNOSIS — Z86711 Personal history of pulmonary embolism: Secondary | ICD-10-CM | POA: Insufficient documentation

## 2016-09-24 DIAGNOSIS — Z801 Family history of malignant neoplasm of trachea, bronchus and lung: Secondary | ICD-10-CM | POA: Insufficient documentation

## 2016-09-24 DIAGNOSIS — I5040 Unspecified combined systolic (congestive) and diastolic (congestive) heart failure: Secondary | ICD-10-CM | POA: Insufficient documentation

## 2016-09-24 DIAGNOSIS — Z720 Tobacco use: Secondary | ICD-10-CM

## 2016-09-24 DIAGNOSIS — F1010 Alcohol abuse, uncomplicated: Secondary | ICD-10-CM

## 2016-09-24 DIAGNOSIS — I2510 Atherosclerotic heart disease of native coronary artery without angina pectoris: Secondary | ICD-10-CM | POA: Insufficient documentation

## 2016-09-24 DIAGNOSIS — Z7901 Long term (current) use of anticoagulants: Secondary | ICD-10-CM | POA: Insufficient documentation

## 2016-09-24 DIAGNOSIS — Z800 Family history of malignant neoplasm of digestive organs: Secondary | ICD-10-CM | POA: Insufficient documentation

## 2016-09-24 DIAGNOSIS — I130 Hypertensive heart and chronic kidney disease with heart failure and stage 1 through stage 4 chronic kidney disease, or unspecified chronic kidney disease: Secondary | ICD-10-CM | POA: Insufficient documentation

## 2016-09-24 DIAGNOSIS — J45909 Unspecified asthma, uncomplicated: Secondary | ICD-10-CM | POA: Insufficient documentation

## 2016-09-24 LAB — BASIC METABOLIC PANEL
ANION GAP: 12 (ref 5–15)
BUN: 31 mg/dL — AB (ref 6–20)
CO2: 30 mmol/L (ref 22–32)
Calcium: 9.9 mg/dL (ref 8.9–10.3)
Chloride: 94 mmol/L — ABNORMAL LOW (ref 101–111)
Creatinine, Ser: 2.27 mg/dL — ABNORMAL HIGH (ref 0.61–1.24)
GFR calc Af Amer: 40 mL/min — ABNORMAL LOW (ref >60)
GFR calc non Af Amer: 35 mL/min — ABNORMAL LOW (ref >60)
GLUCOSE: 123 mg/dL — AB (ref 65–99)
POTASSIUM: 4 mmol/L (ref 3.5–5.1)
Sodium: 136 mmol/L (ref 135–145)

## 2016-09-24 MED ORDER — METOLAZONE 2.5 MG PO TABS: 2.5000 mg | ORAL_TABLET | ORAL | 2 refills | Status: AC

## 2016-09-24 MED FILL — metOLazone 2.5 MG TABS: 2.5 | 30 days supply | Qty: 15 | Fill #0

## 2016-09-24 NOTE — Patient Instructions (Signed)
Lab work today will call with ABNORMAL lab results.  Repeat labs on 2/19 or 2/20  Follow up in 3 weeks

## 2016-09-24 NOTE — Progress Notes (Signed)
   Subjective:    Patient ID: Donald Brady, male    DOB: Jan 04, 1976, 41 y.o.   MRN: [id number]  HPI chief complaint: Bilateral knee pain  Patient comes in today requesting bilateral knee cortisone injections. Last injections were about 4 months ago. Pain has now returned. It is identical in nature to what he has experienced previously. He denies any recent trauma. He was recently hospitalized for congestive heart failure.  Medications reviewed Allergies reviewed    Review of Systems    as above Objective:   Physical Exam  Obese. No acute distress. Awake alert and oriented 3.  Examination of each knee shows range of motion from 0-120. 1+ boggy synovitis. No effusion. 2+ patellofemoral crepitus. He is tender to palpation along the medial joint lines. Pain but no popping with McMurray's. Good joint stability. Neurovascularly intact distally.       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 He is not healthy enough at this point in time to undergo total knee arthroplasty. Therefore, I will reinject each knee today. An anterior lateral approach was utilized after risks and benefits were explained. He tolerates these without difficulty. Follow-up as needed.  Consent obtained and verified. Time-out conducted. Noted no overlying erythema, induration, or other signs of local infection. Skin prepped in a sterile fashion. Topical analgesic spray: Ethyl chloride. Joint: right knee Needle: 22g 1.5 inch Completed without difficulty. Meds: 3cc 1% xylocaine, 1cc (40mg ) depomedrol  Advised to call if fevers/chills, erythema, induration, drainage, or persistent bleeding.  Consent obtained and verified. Time-out conducted. Noted no overlying erythema, induration, or other signs of local infection. Skin prepped in a sterile fashion. Topical analgesic spray: Ethyl chloride. Joint: left knee Needle:  22g 1.5 inch Completed without difficulty. Meds: 3cc 1% xylocaine, 1cc (40mg ) depomedrol  Advised to call if fevers/chills, erythema, induration, drainage, or persistent bleeding.

## 2016-09-24 NOTE — Progress Notes (Signed)
PCP: Dr Quay Burow  Primary Cardiologist: Dr Martinique  HF MD: Dr Haroldine Laws   HPI: [name] is a 41 year old with a history of chronic systolic heart failure (suspected ETOH), NICM. PE 2016, factor V leiden, asthma, htn, osa, ckd stage III, major depression disorder, and ETOH abuse. LHC 2016 non obstructive cad. No family history of heart disease.   Hospitalized in July 0000000 with A/C systolic heart failure. Left AMA. Admitted January 2nd with A/C systolic heart failure. Diuresed with IV lasix and transitioned to lasix 40 mg daily. Discharge weight was  383 pounds.  Readmitted January 8th, 2018 with increased dyspnea and marked volume overload. Diuresed with IV lasix and started on dig, spiro, and bidil. Discharge 364 pounds.   Evaluated in [facility] ED January 15th due to difficulty urinating. Given IV lasix. UA was negative for UTI.   He returns today for follow up. Given IV lasix in clinic. Has only had one 12 oz beer since last visit. Taking metolazone 3 times weekly as directed. Went to grocery store the other day without SOB now that his diuresis has improved. No SOB with ADLs. Has chronic orthopnea, sleeps leaned over the couch.  Weight at home 359-363 lbs. Watching diet and fluid limitations.  Taking all medications as directed.    Review of Systems  HENT: Negative.   Eyes: Negative.   Respiratory: Positive for cough.   Cardiovascular: Positive for orthopnea and leg swelling.  Gastrointestinal: Negative.   Genitourinary: Negative.   Musculoskeletal: Negative.   Skin: Negative.   Neurological: Negative.   Endo/Heme/Allergies: Negative.   Psychiatric/Behavioral: Positive for depression and substance abuse.    ECHO 08/18/2016: EF 20%  RV mildly dilated and moderately HK . Grade II DD  LHC 02/2015:  1. 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 Moderate LV dysfunction   SH:  Social History   Social History  . Marital status: Single    Spouse name: N/A   . Number of children: 1  . Years of education: N/A   Occupational History  . Disabled- Architect   .  Other   Social History Main Topics  . Smoking status: Former Smoker    Packs/day: 1.00    Years: 21.00    Types: Cigarettes    Quit date: 07/31/2016  . Smokeless tobacco: Never Used     Comment: Stress.None in last couple days  . Alcohol use 0.0 oz/week     Comment: everyday  . Drug use: Yes     Comment: Marijuana.  . Sexual activity: Not on file   Other Topics Concern  . Not on file   Social History Narrative   Lives in [place] with his dog.  Has a 18 year old daughter that he sees daily.  Has good relationship with daughter's mom.  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     FH:  Family History  Problem Relation Age of Onset  . Diabetes Mother   . Hypertension Mother   . Hearing loss Father     died in his 40's  . Cancer Father     unknown type  . Clotting disorder Father   . Heart disease Father   . Diabetes Maternal Aunt   . Hypertension Maternal Aunt   . Diabetes Maternal Uncle   . Hypertension Maternal Uncle   . Stomach cancer Maternal Uncle   . Lung cancer Paternal Uncle  was a smoker  . Breast cancer Maternal Aunt   . Stomach cancer Maternal Uncle   . Liver disease Maternal Uncle     drinker  . Kidney disease Maternal Aunt     Past Medical History:  Diagnosis Date  . Anxiety   . Arthritis    left knee  . Asthma    no inhaler use in 1 year  . CHF (congestive heart failure) ([place])   . Factor V Leiden ([place])   . GERD (gastroesophageal reflux disease)   . History of pulmonary embolus (PE)    takes Xarelto  . History of seizures    no known cause, per pt. - has been "years" since last seizure; no longer on anticonvulsant  . HLD (hyperlipidemia)   . Hypertension    states BP "runs high"; has been on med. "a while"  . Lateral meniscus tear 01/2015   left knee  . Shortness of breath dyspnea   . Sleep apnea     had sleep study 10/2013:  "severe" sleep apnea, states could not afford CPAP machine    Current Outpatient Prescriptions  Medication Sig Dispense Refill  . albuterol (PROVENTIL HFA;VENTOLIN HFA) 108 (90 Base) MCG/ACT inhaler Inhale 2 puffs into the lungs every 6 (six) hours as needed for wheezing or shortness of breath. 6.7 g 3  . atorvastatin (LIPITOR) 40 MG tablet Take 40 mg by mouth daily.   0  . carvedilol (COREG) 3.125 MG tablet Take 1 tablet (3.125 mg total) by mouth 2 (two) times daily. 60 tablet 2  . digoxin (LANOXIN) 0.125 MG tablet Take 1 tablet (0.125 mg total) by mouth daily. 30 tablet 6  . folic acid (FOLVITE) 1 MG tablet TAKE 1 TABLET BY MOUTH ONCE DAILY 30 tablet 11  . furosemide (LASIX) 80 MG tablet Take 1 tablet (80 mg total) by mouth 2 (two) times daily. 60 tablet 3  . hydrALAZINE (APRESOLINE) 25 MG tablet TAKE 1 & 1/2 TABLETS BY MOUTH 3 TIMES A DAY 135 tablet 11  . isosorbide mononitrate (IMDUR) 30 MG 24 hr tablet Take 30 mg by mouth 3 (three) times daily.    . metolazone (ZAROXOLYN) 2.5 MG tablet Take 1 tablet (2.5 mg total) by mouth 3 (three) times a week. Tuesday, Thursday, Saturday 15 tablet 2  . mometasone-formoterol (DULERA) 200-5 MCG/ACT AERO Inhale 2 puffs into the lungs 2 (two) times daily. 8.8 g 3  . Multiple Vitamin (MULTIVITAMIN) tablet Take 1 tablet by mouth daily.    . potassium chloride SA (K-DUR,KLOR-CON) 20 MEQ tablet Take 20 mEq by mouth 2 (two) times daily. Take an additional 20 mEq (1 tablet) when take metolazone    . rivaroxaban (XARELTO) 20 MG TABS tablet Take 1 tablet (20 mg total) by mouth daily with supper. 90 tablet 3  . spironolactone (ALDACTONE) 25 MG tablet Take 1 tablet (25 mg total) by mouth daily. 30 tablet 6  . venlafaxine XR (EFFEXOR-XR) 75 MG 24 hr capsule Take 75 mg by mouth daily with breakfast.     No current facility-administered medications for this encounter.     Vitals:   09/24/16 1133  BP: 110/86  Pulse: (!) 103  SpO2: 93%   Weight: (!) 359 lb (162.8 kg)   Wt Readings from Last 3 Encounters:  09/24/16 (!) 359 lb (162.8 kg)  09/23/16 (!) 360 lb (163.3 kg)  09/16/16 (!) 367 lb 9.6 oz (166.7 kg)     PHYSICAL EXAM: General:  Obese. NAD. Ambulated into clinic without  difficulty.   HEENT: Normal Neck: supple. JVP 7-8 cm. Carotids 2+ bilaterally; no bruits. No thyromegaly or nodule noted.  Cor: PMI normal. RRR. No rubs or murmurs. + S3  Lungs: Clear, normal effort.  Abdomen: obese, NT, ND. no HSM. No bruits or masses. +BS  Extremities: no cyanosis, clubbing, rash,  Trace ankle edema. Warm  Neuro: alert & orientedx3, cranial nerves grossly intact. Moves all 4 extremities w/o difficulty. Affect very pleasant.  ASSESSMENT & PLAN: 1. ChronicCombined Systolic/Diastolic HF- NICM , LHC Q000111Q Non obs CAD. ECHO EF 20% RV moderate HK. Reduced EF likely from ETOH abuse and HTN.  - NYHA III-IIIb. Volume status improved after IV lasix last week and increase of metolazone.  - Continue lasix 80 mg BID and continue metolazone 2.5 mg Tues/Thur/Sat. BMET today and will follow in 7-10 days with adjustments.  - Continue potassium 20 meq twice a day. For now.   - Continue spiro 25 mg daily.  - Continue hydralazine 37.5 mg TID.  - Continue Imdur  30 mg daily.   - Continue coreg 3.125 mg BID . Anticipate increasing at next visit.   - Reinforced fluid restriction to < 2 L daily, sodium restriction to less than 2000 mg daily, and the importance of daily weights.   - Plan to repeat ECHO in 3 months after HF meds optimized.   2. CKD II-III - Baseline 1.5. BMET today with increase of metolazone.   3. H/O PE 2016- chronic xarelto. No bleeding problems.  - No change in current plan.   4. OSA- Continue nightly CPAP.  5. Asthma 6. Factor V Leiden 7. Major Depressive Disorder 8. ETOH abuse - Ongoing alcohol abuse. Needs rehab but says he want to hold off. Trying to get back on his own.     9. Morbid obesity - Encouraged to reduce  portions and increase activity.  10. Smoker - He declines smoking cessation.  Encouraged to stop.  11. HTN - Stable. Will plan on increasing meds at next visit with ongoing diuretic adjustment.   Continue paramedicine.  Follow up next week. To reassess volume status. BMET today ok.   Shirley Friar, PA-C  11:40 AM

## 2016-09-27 ENCOUNTER — Other Ambulatory Visit: Payer: Self-pay | Admitting: Pharmacist

## 2016-09-27 DIAGNOSIS — F329 Major depressive disorder, single episode, unspecified: Secondary | ICD-10-CM

## 2016-09-27 DIAGNOSIS — F32A Depression, unspecified: Secondary | ICD-10-CM

## 2016-09-27 MED ORDER — BUPROPION HCL ER (XL) 150 MG PO TB24: 150.0000 mg | ORAL_TABLET | Freq: Every day | ORAL | 0 refills | Status: DC

## 2016-09-27 MED FILL — BUPROPION HCL XL 150 MG TAB: 150 | 30 days supply | Qty: 30 | Fill #0

## 2016-09-27 NOTE — Progress Notes (Signed)
In October 2017, PCP approved switching patient from venlafaxine to bupropion due to lack of efficacy with venlafaxine and patient preference. Since that time, patient changed his mind and elected to try dose titration with venlafaxine to due having extra supply of medication shipped from manufacturer. Patient reports still having lack of efficacy and would like to switch to bupropion. Coordinating care for patient appointment on 09/28/16. Will work with patient to transition therapy.

## 2016-09-28 ENCOUNTER — Ambulatory Visit (INDEPENDENT_AMBULATORY_CARE_PROVIDER_SITE_OTHER): Payer: Self-pay | Admitting: Internal Medicine

## 2016-09-28 ENCOUNTER — Ambulatory Visit: Payer: Self-pay | Admitting: Pharmacist

## 2016-09-28 DIAGNOSIS — I5022 Chronic systolic (congestive) heart failure: Secondary | ICD-10-CM

## 2016-09-28 DIAGNOSIS — Z79899 Other long term (current) drug therapy: Secondary | ICD-10-CM

## 2016-09-28 DIAGNOSIS — I10 Essential (primary) hypertension: Secondary | ICD-10-CM

## 2016-09-28 DIAGNOSIS — F329 Major depressive disorder, single episode, unspecified: Secondary | ICD-10-CM

## 2016-09-28 DIAGNOSIS — I110 Hypertensive heart disease with heart failure: Secondary | ICD-10-CM

## 2016-09-28 DIAGNOSIS — Z86711 Personal history of pulmonary embolism: Secondary | ICD-10-CM

## 2016-09-28 DIAGNOSIS — Z8669 Personal history of other diseases of the nervous system and sense organs: Secondary | ICD-10-CM

## 2016-09-28 DIAGNOSIS — F32A Depression, unspecified: Secondary | ICD-10-CM

## 2016-09-28 DIAGNOSIS — Z7901 Long term (current) use of anticoagulants: Secondary | ICD-10-CM

## 2016-09-28 DIAGNOSIS — F17210 Nicotine dependence, cigarettes, uncomplicated: Secondary | ICD-10-CM

## 2016-09-28 LAB — BASIC METABOLIC PANEL
ANION GAP: 13 (ref 5–15)
BUN: 34 mg/dL — ABNORMAL HIGH (ref 6–20)
CALCIUM: 9.1 mg/dL (ref 8.9–10.3)
CHLORIDE: 96 mmol/L — AB (ref 101–111)
CO2: 26 mmol/L (ref 22–32)
CREATININE: 1.95 mg/dL — AB (ref 0.61–1.24)
GFR calc non Af Amer: 42 mL/min — ABNORMAL LOW (ref >60)
GFR, EST AFRICAN AMERICAN: 48 mL/min — AB (ref >60)
Glucose, Bld: 127 mg/dL — ABNORMAL HIGH (ref 65–99)
Potassium: 3.5 mmol/L (ref 3.5–5.1)
SODIUM: 135 mmol/L (ref 135–145)

## 2016-09-28 NOTE — Assessment & Plan Note (Signed)
Patient reports that his mood has been good. He is no longer taking Effexor. Pharmacy has been assisting [place] with ordering his medications including PEEP or prion for both depression and tobacco cessation. However, patient does have remote history of seizures that I see in his chart. He is not on antiepileptics. It is unclear to me at this point if bupropion is appropriate for him. His seizure may have been due to alcohol withdrawal versus drug use at that time.

## 2016-09-28 NOTE — Progress Notes (Signed)
CC: Follow-up for hypertension and heart failure  HPI: [name] is a 41 y.o. male with PMHx of history of pulmonary embolism on anticoagulation, chronic systolic heart failure, hypertension, chronic kidney disease who presents to the clinic for follow-up for hypertension and heart failure.   Since discharge from the hospital in January 2018, patient reports that he has been doing well. Patient reports compliance with his spironolactone 25 mg once a day, metolazone 2.5 mg 3 times a week, Imdur 30 mg 3 times a day, hydralazine 37.5 mg 3 times a day, Lasix 80 mg twice a day, digoxin 0.125 mg once a day, carvedilol 3.125 mg twice a day. His weight is up 6 pounds from last check, but he reports he has not taken his metolazone today. He denies increased dyspnea on exertion, shortness of breath, lower extremity edema. He knows if he needs to take an extra Lasix he is to call heart failure to let them know. He reports he has been weighing himself daily. He states he has been drinking 1 alcoholic beverage per week, which is a great improvement for him. He continues to smoke tobacco and would like to quit. He denies illicit drug use with cocaine.  Patient reports that his mood has been good. He is no longer taking Effexor. Pharmacy has been assisting [place] with ordering his medications including PEEP or prion for both depression and tobacco cessation. However, patient does have remote history of seizures that I see in his chart. He is not on antiepileptics. It is unclear to me at this point if bupropion is appropriate for him. His seizure may have been due to alcohol withdrawal versus drug use at that time.   Past Medical History:  Diagnosis Date  . Anxiety   . Arthritis    left knee  . Asthma    no inhaler use in 1 year  . CHF (congestive heart failure) ([place])   . Factor V Leiden ([place])   . GERD (gastroesophageal reflux disease)   . History of pulmonary embolus (PE)    takes Xarelto  . History  of seizures    no known cause, per pt. - has been "years" since last seizure; no longer on anticonvulsant  . HLD (hyperlipidemia)   . Hypertension    states BP "runs high"; has been on med. "a while"  . Lateral meniscus tear 01/2015   left knee  . Shortness of breath dyspnea   . Sleep apnea    had sleep study 10/2013:  "severe" sleep apnea, states could not afford CPAP machine    Review of Systems: Please see pertinent ROS reviewed in HPI and problem based charting.   Physical Exam: Vitals:   09/28/16 0931  BP: (!) 136/92  Pulse: (!) 108  Temp: 97.6 F (36.4 C)  TempSrc: Oral  SpO2: 97%  Weight: (!) 364 lb 8.6 oz (165.4 kg)  Height: 6\' 3"  (1.905 m)   General: Vital signs reviewed.  Patient is well-developed and well-nourished, in no acute distress and cooperative with exam.  Neck: Supple, trachea midline, no carotid bruit present.  Cardiovascular: RRR, S1 normal, S2 normal, no murmurs, gallops, or rubs. Pulmonary/Chest: Clear to auscultation bilaterally, no wheezes, rales, or rhonchi. Abdominal: Soft, non-tender, obese, non-distended, BS + Extremities: Trace lower extremity edema bilaterally Skin: Warm, dry and intact. No rashes or erythema. Psychiatric: Normal mood and affect. speech and behavior is normal. Cognition and memory are normal.   Assessment & Plan:  See encounters tab  for problem based medical decision making. Patient discussed with Dr. Angelia Mould

## 2016-09-28 NOTE — Patient Instructions (Signed)
Donald Brady,  Donald Brady are doing and excellent job taking your medications. Please keep a close track on your weight. Great job cutting back on alcohol use. I will look into the bupropion for tobacco cessation and depression, to determine if this is a safe medication for you to take. Please follow up with [place] in 3 months.

## 2016-09-28 NOTE — Progress Notes (Signed)
Internal Medicine Clinic Attending  Case discussed with Dr. Burns at the time of the visit.  We reviewed the resident's history and exam and pertinent patient test results.  I agree with the assessment, diagnosis, and plan of care documented in the resident's note.  

## 2016-09-28 NOTE — Assessment & Plan Note (Signed)
BP Readings from Last 3 Encounters:  09/28/16 (!) 136/92  09/24/16 110/86  09/23/16 132/90    Lab Results  Component Value Date   NA 135 09/28/2016   K 3.5 09/28/2016   CREATININE 1.95 (H) 09/28/2016    Assessment: Blood pressure control:  controlled Progress toward BP goal:   at goal Comments: Compliant with medications  Plan: Medications:  continue current medications Other plans: Follow up in 3 months

## 2016-09-28 NOTE — Assessment & Plan Note (Signed)
Since discharge from the hospital in January 2018, patient reports that he has been doing well. Patient reports compliance with his spironolactone 25 mg once a day, metolazone 2.5 mg 3 times a week, Imdur 30 mg 3 times a day, hydralazine 37.5 mg 3 times a day, Lasix 80 mg twice a day, digoxin 0.125 mg once a day, carvedilol 3.125 mg twice a day. His weight is up 6 pounds from last check, but he reports he has not taken his metolazone today. He denies increased dyspnea on exertion, shortness of breath, lower extremity edema. He knows if he needs to take an extra Lasix he is to call heart failure to let them know. He reports he has been weighing himself daily. He states he has been drinking 1 alcoholic beverage per week, which is a great improvement for him. He continues to smoke tobacco and would like to quit. He denies illicit drug use with cocaine.  Plan: -Continue current medications -Continue daily weights -Call heart failure if weight stays elevated despite metolazone today or if he begins to develop symptoms -Basic metabolic panel today to assess for kidney function and potassium

## 2016-09-29 MED ORDER — ATORVASTATIN CALCIUM 40 MG PO TABS: 40.0000 mg | ORAL_TABLET | Freq: Every day | ORAL | 0 refills | Status: DC

## 2016-09-29 MED ORDER — MOMETASONE FURO-FORMOTEROL FUM 200-5 MCG/ACT IN AERO: 2.0000 | INHALATION_SPRAY | Freq: Two times a day (BID) | RESPIRATORY_TRACT | 3 refills | Status: AC

## 2016-09-29 MED ORDER — CARVEDILOL 3.125 MG PO TABS: 3.1250 mg | ORAL_TABLET | Freq: Two times a day (BID) | ORAL | 2 refills | Status: DC

## 2016-09-29 MED ORDER — FUROSEMIDE 80 MG PO TABS: 80.0000 mg | ORAL_TABLET | Freq: Two times a day (BID) | ORAL | 3 refills | Status: DC

## 2016-09-29 MED ORDER — HYDRALAZINE HCL 25 MG PO TABS: 25.0000 mg | ORAL_TABLET | Freq: Four times a day (QID) | ORAL | 11 refills | Status: DC

## 2016-09-29 MED ORDER — NICOTINE 10 MG IN INHA: 1.0000 | RESPIRATORY_TRACT | 2 refills | Status: AC | PRN

## 2016-09-29 MED ORDER — POTASSIUM CHLORIDE CRYS ER 20 MEQ PO TBCR: 20.0000 meq | EXTENDED_RELEASE_TABLET | Freq: Two times a day (BID) | ORAL | 0 refills | Status: DC
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
## 2016-09-30 ENCOUNTER — Other Ambulatory Visit (HOSPITAL_COMMUNITY): Payer: Self-pay | Admitting: Pharmacist

## 2016-09-30 ENCOUNTER — Other Ambulatory Visit: Payer: Self-pay | Admitting: Internal Medicine

## 2016-09-30 ENCOUNTER — Other Ambulatory Visit (HOSPITAL_COMMUNITY): Payer: Self-pay

## 2016-09-30 DIAGNOSIS — E7849 Other hyperlipidemia: Secondary | ICD-10-CM

## 2016-09-30 MED ORDER — HYDRALAZINE HCL 25 MG PO TABS: 37.5000 mg | ORAL_TABLET | Freq: Three times a day (TID) | ORAL | 11 refills | Status: DC

## 2016-09-30 NOTE — Progress Notes (Signed)
Paramedicine Encounter    Patient ID: Donald Brady, male    DOB: [birth year], 41 y.o.   MRN: [id number]   Patient Care Team: Florinda Marker, MD as PCP - General  Patient Active Problem List   Diagnosis Date Noted  . Back pain   . Morbid obesity due to excess calories ([place])   . Presbyopia 01/26/2016  . Alcohol abuse 01/26/2016  . Hypertensive heart disease 10/01/2015  . Chronic systolic CHF (congestive heart failure) ([place]) 10/01/2015  . Umbilical hernia without obstruction and without gangrene 08/29/2015  . Pre-diabetes 08/29/2015  . CKD (chronic kidney disease), stage III 08/19/2015  . Nonobstructive cardiomyopathy ([place]) 05/05/2015  . Cocaine use 12/23/2014  . Depression 10/10/2014  . GERD (gastroesophageal reflux disease) 07/24/2014  . Polycythemia, secondary 05/23/2014  . Factor 5 Leiden mutation, heterozygous ([place]) 05/22/2014  . Hyperlipidemia 04/23/2014  . Sinusitis, chronic 04/23/2014  . Severe obstructive sleep apnea 11/20/2013  . Essential hypertension, benign 08/04/2013  . Asthma 02/20/2013  . Osteoarthritis of left knee 06/28/2012  . Long term current use of anticoagulant therapy 12/02/2011  . Bilateral pulmonary embolism ([place]) 11/27/2011  . Tobacco abuse 11/27/2011    Current Outpatient Prescriptions:  .  albuterol (PROVENTIL HFA;VENTOLIN HFA) 108 (90 Base) MCG/ACT inhaler, Inhale 2 puffs into the lungs every 6 (six) hours as needed for wheezing or shortness of breath., Disp: 6.7 g, Rfl: 3 .  atorvastatin (LIPITOR) 40 MG tablet, Take 1 tablet (40 mg total) by mouth daily., Disp: 30 tablet, Rfl: 0 .  buPROPion (WELLBUTRIN XL) 150 MG 24 hr tablet, Take 1 tablet (150 mg total) by mouth daily. IM program, [facility], Disp: 30 tablet, Rfl: 0 .  carvedilol (COREG) 3.125 MG tablet, Take 1 tablet (3.125 mg total) by mouth 2 (two) times daily., Disp: 60 tablet, Rfl: 2 .  digoxin (LANOXIN) 0.125 MG tablet, Take 1 tablet (0.125 mg total) by mouth daily., Disp: 30 tablet, Rfl: 6 .   folic acid (FOLVITE) 1 MG tablet, TAKE 1 TABLET BY MOUTH ONCE DAILY, Disp: 30 tablet, Rfl: 11 .  furosemide (LASIX) 80 MG tablet, Take 1 tablet (80 mg total) by mouth 2 (two) times daily., Disp: 60 tablet, Rfl: 3 .  hydrALAZINE (APRESOLINE) 25 MG tablet, Take 1 tablet (25 mg total) by mouth 4 (four) times daily. (Patient taking differently: Take 25 mg by mouth 3 (three) times daily. ), Disp: 135 tablet, Rfl: 11 .  isosorbide mononitrate (IMDUR) 30 MG 24 hr tablet, Take 30 mg by mouth 3 (three) times daily., Disp: , Rfl:  .  metolazone (ZAROXOLYN) 2.5 MG tablet, Take 1 tablet (2.5 mg total) by mouth 3 (three) times a week. Tuesday, Thursday, Saturday, Disp: 15 tablet, Rfl: 2 .  mometasone-formoterol (DULERA) 200-5 MCG/ACT AERO, Inhale 2 puffs into the lungs 2 (two) times daily., Disp: 8.8 g, Rfl: 3 .  Multiple Vitamin (MULTIVITAMIN) tablet, Take 1 tablet by mouth daily., Disp: , Rfl:  .  potassium chloride SA (K-DUR,KLOR-CON) 20 MEQ tablet, Take 1 tablet (20 mEq total) by mouth 2 (two) times daily. Take an additional 20 mEq (1 tablet) when take metolazone, Disp: 72 tablet, Rfl: 0 .  rivaroxaban (XARELTO) 20 MG TABS tablet, Take 1 tablet (20 mg total) by mouth daily with supper., Disp: 90 tablet, Rfl: 3 .  spironolactone (ALDACTONE) 25 MG tablet, Take 1 tablet (25 mg total) by mouth daily., Disp: 30 tablet, Rfl: 6 .  nicotine (NICOTROL) 10 MG inhaler, Inhale 1 Cartridge (1 continuous puffing total)  into the lungs as needed for smoking cessation. Use at least 6 cartridges daily x 3-4 weeks (Patient not taking: Reported on 09/30/2016), Disp: 504 each, Rfl: 2 Allergies  Allergen Reactions  . Bee Venom Anaphylaxis  . Honey Anaphylaxis    RAW HONEY  . Shellfish Allergy Shortness Of Breath and Swelling  . Ibuprofen Hives and Swelling  . Betadine [Povidone Iodine]     BECAUSE OF SHELLFISH ALLERGY     Social History   Social History  . Marital status: Single    Spouse name: N/A  . Number of  children: 1  . Years of education: N/A   Occupational History  . Disabled- Architect   .  Other   Social History Main Topics  . Smoking status: Former Smoker    Packs/day: 0.10    Years: 21.00    Types: Cigarettes    Quit date: 07/31/2016  . Smokeless tobacco: Never Used     Comment: a pack a week  . Alcohol use 0.0 oz/week     Comment: everyday  . Drug use: Yes     Comment: Marijuana.  . Sexual activity: Not on file   Other Topics Concern  . Not on file   Social History Narrative   Lives in [place] with his dog.  Has a 58 year old daughter that he sees daily.  Has good relationship with daughter's mom.  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     Physical Exam  Constitutional: He is oriented to person, place, and time.  Eyes: Pupils are equal, round, and reactive to light.  Neck: Normal range of motion.  Cardiovascular: Normal rate and regular rhythm.   Pulmonary/Chest: Effort normal and breath sounds normal.  Musculoskeletal: He exhibits no edema.  Neurological: He is oriented to person, place, and time.        [place] - 09/30/16 1100      Situation   Admitting diagnosis chf   Heart failure history Exisiting   Readmitted within 30 days Yes   Hospital admission within past 12 months Yes   number of hospital admissions 5   number of ED visits 10     Assessment   Lives alone No   Primary support person Mom/step-dad/girlfriend   Mode of transportation personal car   Other services involved None   Home equipement Scale     Weight   Weighs self daily Yes   Scale provided No  has own scale   Records on weight chart No     Resources   Has "Living better w/heart failure" book Yes   Has HF Zone tool Yes   Able to identify yellow zone signs/when to call MD Yes   Records zone daily No     Medications   Uses a pill box Yes   Who stocks the pill box Self   Pill box checked this visit Yes   Pill box refilled this visit No    Difficulty obtaining medications No   Mail order medications Yes   New medications at home today No   Next scheduled delivery of medications 10/07/16   Missed one or more doses of medications per week Yes   How many missed doses this week 2  Xarelto     Nutrition   Patient receives meals on wheels No   Patient follows low sodium diet No   Has foods at home that meet the current recommended diet Yes  Patient follows low sugar/card diet Yes   Nutritional concerns/issues n     Activity Level   ADL's/Mobility Independent   How many feet can patient ambulate >7ft    Typical activity level Active   Barriers None   Actvity tolerance: NYHA Class 2     Urine   Difficulty urinating No   Changes in urine None     Time spent with patient   Time spent with patient  [place] - 09/30/16 1100      Outside of House   Sidewalk and pathway to house is level and free from any hazards Yes   Driveway is free from debris/snow/ice Yes   Outside stairs are stable and have sturdy handrail Yes   Porch lights are working and provide adequate lighting Yes     Living Room   Furniture is of adequate height and offers arm rests that assist in getting up and down Yes   Floor is free from any clutter that would create tripping hazards Yes   All cords are either behind furniture or secured in a manner that does not cause trip hazards Yes   All rugs are secured to floor with double-sided tape No   Lighting is adequate to light room Yes   All lighting has an easily accessible on/off switch Yes   Phone is readily accessible near favorite seating areas Yes   Emergency numbers are printed near all phones in house Yes     Kitchen   Items used most often are within easy reach on low shelves Yes   Step stool is present, is sturdy and has a handrail N/A   Floor mats are non-slip tread and secured to floor Yes   Oven controls are within easy reach Yes    Kitchen lighting is adequate and easy to reach switches Yes   ABC fire extinguisher is located in kitchen No     [place] is properly secured to stairs and/or all wood is properly secured N/A   Handrail is present and sturdy N/A   Stairs are free from any clutter N/A   Stairway is adequately lit N/A     Bathroom   Tub and shower have a non-slip surface No   Tub and/or shower have a grab bar for stability No   Toilet has a raised seat No   Grab bar is attached near toilet for assistance No   Pathway from bedroom to bathroom is free from clutter and well lit for ease of movement in the middle of the night Yes     Bedroom   Floor is free from clutter Yes   Light is near bed and is easy to turn on Yes   Phone is next to bed and within easy reach Yes   Flashlight is near bed in case of emergency Yes     General   Smoke detectors in all areas of the house (each floor) and tested Yes   CO detectors on each floor of house and tested No   Flashlights are handy throughout the home Yes   Resident has all medical information readily available and in an area emergency providers will easily find Yes   All heaters are away from any type of flammable material Yes     Overall Tips   Homeowner ha good non-skid shoes to move around house Yes   All assisted walking devices  are readily accessible and in good condition N/A   There is a phone near the floor for ease of reach in case of a fall YES   All O2 tubing is less than 50 ft. and is not a trip hazard N/A   Resident has had an annual hearing and vision check by a physician No   Resident has the proper hearing and visual aids prescribed and are in good working order No   All medications are properly stored and labeled to avoid confusion on dosage, time to take, and avoidance of missed doses Yes       Future Appointments Date Time Provider [place]  10/04/2016 9:30 AM [facility] LAB [facility] None  10/08/2016 8:00 PM [facility] ROOM 3  [facility]  10/15/2016 9:30 AM [facility] PA/NP [facility] None   BP 114/80   Pulse (!) 106   Resp 18   Wt (!) 360 lb 4.8 oz (163.4 kg)   SpO2 95%   BMI 45.03 kg/m  Weight yesterday-362 Last visit weight-361  Mr. Rairigh was seen at home today and reports feeling well and that he is getting around without difficulty. Medications were ordered from their respective pharmacies. He denies H/A, SOB, dizziness or orthopnea. Hydralazine dose was listed as 4 QID in chart but Mr. Tsung advises he is only taking it TID. Doroteo Bradford was contacted for verification of dose.  ACTION: Home visit completed Next visit planned for 1 week   Marylouise Stacks, EMT-Paramedic Marco Collie, EMT-Paramedic

## 2016-10-01 MED ORDER — HYDRALAZINE HCL 25 MG PO TABS: 37.5000 mg | ORAL_TABLET | Freq: Three times a day (TID) | ORAL | 3 refills | Status: DC

## 2016-10-01 MED ORDER — FOLIC ACID 1 MG PO TABS: 1.0000 mg | ORAL_TABLET | Freq: Every day | ORAL | 3 refills | Status: AC

## 2016-10-01 MED ORDER — ATORVASTATIN CALCIUM 40 MG PO TABS: 40.0000 mg | ORAL_TABLET | Freq: Every day | ORAL | 3 refills | Status: DC

## 2016-10-01 NOTE — Telephone Encounter (Signed)
Dr. Maudie Mercury,   I received this refill for Atorvastatin for Donald Brady to be sent to outpatient pharmacy. I wanted to check with you first as I know he receives many of his medications from Donald Brady. Where should I send this to?  I appreciate your help! [place]

## 2016-10-01 NOTE — Telephone Encounter (Signed)
Hi Helen, I have approved the atorvastatin 40 mg to go to [facility]. Can you please help me call these in?  Thank you! [place]

## 2016-10-01 NOTE — Addendum Note (Signed)
Addended by: Forde Dandy on: 10/01/2016 03:30 PM   Modules accepted: Orders

## 2016-10-01 NOTE — Telephone Encounter (Signed)
Hi Dr. Quay Burow, please send to [facility] pharmacy. It looks like he can get all but 3 medications from them, I will let him know. Thank you

## 2016-10-01 NOTE — Addendum Note (Signed)
Addended by: Forde Dandy on: 10/01/2016 03:35 PM   Modules accepted: Orders

## 2016-10-04 ENCOUNTER — Ambulatory Visit (HOSPITAL_COMMUNITY)
Admission: RE | Admit: 2016-10-04 | Discharge: 2016-10-04 | Disposition: A | Discharge status: Home / Self Care | Payer: Self-pay | Source: Ambulatory Visit | Attending: Cardiology | Admitting: Cardiology

## 2016-10-04 ENCOUNTER — Other Ambulatory Visit (HOSPITAL_COMMUNITY): Payer: Self-pay | Admitting: *Deleted

## 2016-10-04 DIAGNOSIS — I5022 Chronic systolic (congestive) heart failure: Secondary | ICD-10-CM | POA: Insufficient documentation

## 2016-10-04 DIAGNOSIS — I38 Endocarditis, valve unspecified: Secondary | ICD-10-CM

## 2016-10-04 LAB — BASIC METABOLIC PANEL
Anion gap: 14 (ref 5–15)
BUN: 24 mg/dL — ABNORMAL HIGH (ref 6–20)
CHLORIDE: 96 mmol/L — AB (ref 101–111)
CO2: 24 mmol/L (ref 22–32)
Calcium: 9.1 mg/dL (ref 8.9–10.3)
Creatinine, Ser: 1.94 mg/dL — ABNORMAL HIGH (ref 0.61–1.24)
GFR calc non Af Amer: 42 mL/min — ABNORMAL LOW (ref >60)
GFR, EST AFRICAN AMERICAN: 48 mL/min — AB (ref >60)
Glucose, Bld: 139 mg/dL — ABNORMAL HIGH (ref 65–99)
POTASSIUM: 3.4 mmol/L — AB (ref 3.5–5.1)
SODIUM: 134 mmol/L — AB (ref 135–145)

## 2016-10-05 ENCOUNTER — Telehealth (HOSPITAL_COMMUNITY): Payer: Self-pay | Admitting: Cardiology

## 2016-10-05 DIAGNOSIS — E876 Hypokalemia: Secondary | ICD-10-CM

## 2016-10-05 MED ORDER — POTASSIUM CHLORIDE CRYS ER 20 MEQ PO TBCR: EXTENDED_RELEASE_TABLET | ORAL | 0 refills | Status: DC

## 2016-10-05 NOTE — Telephone Encounter (Signed)
-----   Message from Shirley Friar, PA-C sent at 10/04/2016  1:39 PM EST ----- Please increase potassium to 40 meq q am and 20 meq q pm.   Thank you .   Legrand Como [address]" [place], PA-C 10/04/2016 1:39 PM

## 2016-10-05 NOTE — Telephone Encounter (Signed)
Patient aware. And voiced understanding

## 2016-10-07 ENCOUNTER — Other Ambulatory Visit (HOSPITAL_COMMUNITY): Payer: Self-pay

## 2016-10-07 NOTE — Progress Notes (Signed)
Paramedicine Encounter    Patient ID: Donald Brady, male    DOB: Dec 23, 1975, 41 y.o.   MRN: [id number]   Patient Care Team: Florinda Marker, MD as PCP - General  Patient Active Problem List   Diagnosis Date Noted  . Back pain   . Morbid obesity due to excess calories ([place])   . Presbyopia 01/26/2016  . Alcohol abuse 01/26/2016  . Hypertensive heart disease 10/01/2015  . Chronic systolic CHF (congestive heart failure) ([place]) 10/01/2015  . Umbilical hernia without obstruction and without gangrene 08/29/2015  . Pre-diabetes 08/29/2015  . CKD (chronic kidney disease), stage III 08/19/2015  . Nonobstructive cardiomyopathy ([place]) 05/05/2015  . Cocaine use 12/23/2014  . Depression 10/10/2014  . GERD (gastroesophageal reflux disease) 07/24/2014  . Polycythemia, secondary 05/23/2014  . Factor 5 Leiden mutation, heterozygous ([place]) 05/22/2014  . Hyperlipidemia 04/23/2014  . Sinusitis, chronic 04/23/2014  . Severe obstructive sleep apnea 11/20/2013  . Essential hypertension, benign 08/04/2013  . Asthma 02/20/2013  . Osteoarthritis of left knee 06/28/2012  . Long term current use of anticoagulant therapy 12/02/2011  . Bilateral pulmonary embolism ([place]) 11/27/2011  . Tobacco abuse 11/27/2011    Current Outpatient Prescriptions:  .  albuterol (PROVENTIL HFA;VENTOLIN HFA) 108 (90 Base) MCG/ACT inhaler, Inhale 2 puffs into the lungs every 6 (six) hours as needed for wheezing or shortness of breath., Disp: 6.7 g, Rfl: 3 .  atorvastatin (LIPITOR) 40 MG tablet, Take 1 tablet (40 mg total) by mouth daily., Disp: 90 tablet, Rfl: 3 .  buPROPion (WELLBUTRIN XL) 150 MG 24 hr tablet, Take 1 tablet (150 mg total) by mouth daily. IM program, [facility], Disp: 30 tablet, Rfl: 0 .  carvedilol (COREG) 3.125 MG tablet, Take 1 tablet (3.125 mg total) by mouth 2 (two) times daily., Disp: 60 tablet, Rfl: 2 .  digoxin (LANOXIN) 0.125 MG tablet, Take 1 tablet (0.125 mg total) by mouth daily., Disp: 30 tablet, Rfl: 6 .   folic acid (FOLVITE) 1 MG tablet, Take 1 tablet (1 mg total) by mouth daily., Disp: 90 tablet, Rfl: 3 .  furosemide (LASIX) 80 MG tablet, Take 1 tablet (80 mg total) by mouth 2 (two) times daily., Disp: 60 tablet, Rfl: 3 .  hydrALAZINE (APRESOLINE) 25 MG tablet, Take 1.5 tablets (37.5 mg total) by mouth 3 (three) times daily., Disp: 405 tablet, Rfl: 3 .  isosorbide mononitrate (IMDUR) 30 MG 24 hr tablet, Take 30 mg by mouth 3 (three) times daily., Disp: , Rfl:  .  metolazone (ZAROXOLYN) 2.5 MG tablet, Take 1 tablet (2.5 mg total) by mouth 3 (three) times a week. Tuesday, Thursday, Saturday, Disp: 15 tablet, Rfl: 2 .  mometasone-formoterol (DULERA) 200-5 MCG/ACT AERO, Inhale 2 puffs into the lungs 2 (two) times daily., Disp: 8.8 g, Rfl: 3 .  Multiple Vitamin (MULTIVITAMIN) tablet, Take 1 tablet by mouth daily., Disp: , Rfl:  .  potassium chloride SA (K-DUR,KLOR-CON) 20 MEQ tablet, Take 40 meq (2 tabs) in the AM and 20 meq one tab in the PM.Take an add'l 20 mEq (1 tablet) when take metolazone, Disp: 72 tablet, Rfl: 0 .  rivaroxaban (XARELTO) 20 MG TABS tablet, Take 1 tablet (20 mg total) by mouth daily with supper., Disp: 90 tablet, Rfl: 3 .  spironolactone (ALDACTONE) 25 MG tablet, Take 1 tablet (25 mg total) by mouth daily., Disp: 30 tablet, Rfl: 6 .  venlafaxine XR (EFFEXOR-XR) 75 MG 24 hr capsule, Take 75 mg by mouth every other day., Disp: , Rfl:  .  nicotine (NICOTROL) 10 MG inhaler, Inhale 1 Cartridge (1 continuous puffing total) into the lungs as needed for smoking cessation. Use at least 6 cartridges daily x 3-4 weeks (Patient not taking: Reported on 09/30/2016), Disp: 504 each, Rfl: 2 Allergies  Allergen Reactions  . Bee Venom Anaphylaxis  . Honey Anaphylaxis    RAW HONEY  . Shellfish Allergy Shortness Of Breath and Swelling  . Ibuprofen Hives and Swelling  . Betadine [Povidone Iodine]     BECAUSE OF SHELLFISH ALLERGY     Social History   Social History  . Marital status: Single     Spouse name: N/A  . Number of children: 1  . Years of education: N/A   Occupational History  . Disabled- Architect   .  Other   Social History Main Topics  . Smoking status: Former Smoker    Packs/day: 0.10    Years: 21.00    Types: Cigarettes    Quit date: 07/31/2016  . Smokeless tobacco: Never Used     Comment: a pack a week  . Alcohol use 0.0 oz/week     Comment: everyday  . Drug use: Yes     Comment: Marijuana.  . Sexual activity: Not on file   Other Topics Concern  . Not on file   Social History Narrative   Lives in [place] with his dog.  Has a 97 year old daughter that he sees daily.  Has good relationship with daughter's mom.  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     Physical Exam  Constitutional: He is oriented to person, place, and time. He appears well-developed.  Pulmonary/Chest: Effort normal and breath sounds normal.  Abdominal: Soft.  Musculoskeletal: Normal range of motion.  Neurological: He is oriented to person, place, and time.  Skin: Skin is warm and dry.  Psychiatric: He has a normal mood and affect.        SAFE - 09/30/16 1100      Situation   Admitting diagnosis chf   Heart failure history Exisiting   Readmitted within 30 days Yes   Hospital admission within past 12 months Yes   number of hospital admissions 5   number of ED visits 10     Assessment   Lives alone No   Primary support person Mom/step-dad/girlfriend   Mode of transportation personal car   Other services involved None   Home equipement Scale     Weight   Weighs self daily Yes   Scale provided No  has own scale   Records on weight chart No     Resources   Has "Living better w/heart failure" book Yes   Has HF Zone tool Yes   Able to identify yellow zone signs/when to call MD Yes   Records zone daily No     Medications   Uses a pill box Yes   Who stocks the pill box Self   Pill box checked this visit Yes   Pill box refilled  this visit No   Difficulty obtaining medications No   Mail order medications Yes   New medications at home today No   Next scheduled delivery of medications 10/07/16   Missed one or more doses of medications per week Yes   How many missed doses this week 2  Xarelto     Nutrition   Patient receives meals on wheels No   Patient follows low sodium diet No   Has foods at  home that meet the current recommended diet Yes   Patient follows low sugar/card diet Yes   Nutritional concerns/issues n     Activity Level   ADL's/Mobility Independent   How many feet can patient ambulate >59ft    Typical activity level Active   Barriers None   Actvity tolerance: NYHA Class 2     Urine   Difficulty urinating No   Changes in urine None     Time spent with patient   Time spent with patient  45 Minutes        Future Appointments Date Time Provider [place]  10/08/2016 8:00 PM [facility] 3 [facility]  10/15/2016 9:30 AM [facility] PA/NP [facility] None   BP 110/90   Pulse (!) 104   Resp 16   Wt (!) 362 lb (164.2 kg)   SpO2 96%   BMI 45.25 kg/m  Weight yesterday-362 Last visit weight-360  Pt reports he is feeling good-he got near some shrimp last night at dinner and he began to have an itchy throat-took some benadryl last night and again this morning so he is sleepy from that effect. No swelling to tongue, no sob, no hives. Advised him seek medical care if it gets worse.  No swelling noted. No sob. No chest pains. meds verified and pill box checked. No missed doses. Confirmed at [facility] he has meds to be picked up and also [facility] he uses and his meds were sent out yesterday.   no complaints today- he wants to try for detox/rehab after flu season is over.  ACTION: Home visit completed Next visit planned for 1 week   Marylouise Stacks, EMT-Paramedic  10/07/16

## 2016-10-08 ENCOUNTER — Ambulatory Visit (HOSPITAL_BASED_OUTPATIENT_CLINIC_OR_DEPARTMENT_OTHER): Payer: Self-pay | Attending: Oncology | Admitting: Internal Medicine

## 2016-10-08 VITALS — Ht 75.0 in | Wt 360.0 lb

## 2016-10-08 DIAGNOSIS — G4733 Obstructive sleep apnea (adult) (pediatric): Secondary | ICD-10-CM | POA: Insufficient documentation

## 2016-10-08 DIAGNOSIS — G4730 Sleep apnea, unspecified: Secondary | ICD-10-CM

## 2016-10-12 ENCOUNTER — Telehealth: Payer: Self-pay | Admitting: Pharmacist

## 2016-10-12 NOTE — Telephone Encounter (Signed)
Thank you for keeping me informed Dr. Maudie Mercury!

## 2016-10-12 NOTE — Progress Notes (Signed)
Patient called to notify me he still has not received the mometasone-formoterol [facility]) or the Nicotrol. Contacted [facility] and they stated patient should receive by this weekend or early next week. Patient also states that he is feeling better since starting the bupropion, reports no side effects. Patient was advised to contact clinic if further concerns arise. Will continue to assist in the care of this patient as needed, plan to follow up next week to assess efficacy-tolerability of bupropion therapy.

## 2016-10-15 ENCOUNTER — Telehealth (HOSPITAL_COMMUNITY): Payer: Self-pay | Admitting: Vascular Surgery

## 2016-10-15 ENCOUNTER — Other Ambulatory Visit (HOSPITAL_COMMUNITY): Payer: Self-pay

## 2016-10-15 ENCOUNTER — Encounter (HOSPITAL_COMMUNITY): Payer: Self-pay

## 2016-10-15 ENCOUNTER — Ambulatory Visit (HOSPITAL_COMMUNITY)
Admission: RE | Admit: 2016-10-15 | Discharge: 2016-10-15 | Disposition: A | Discharge status: Home / Self Care | Payer: Self-pay | Source: Ambulatory Visit | Attending: Cardiology | Admitting: Cardiology

## 2016-10-15 VITALS — BP 122/90 | HR 116 | Wt 364.6 lb

## 2016-10-15 DIAGNOSIS — Z801 Family history of malignant neoplasm of trachea, bronchus and lung: Secondary | ICD-10-CM | POA: Insufficient documentation

## 2016-10-15 DIAGNOSIS — Z800 Family history of malignant neoplasm of digestive organs: Secondary | ICD-10-CM | POA: Insufficient documentation

## 2016-10-15 DIAGNOSIS — D6851 Activated protein C resistance: Secondary | ICD-10-CM | POA: Insufficient documentation

## 2016-10-15 DIAGNOSIS — Z8051 Family history of malignant neoplasm of kidney: Secondary | ICD-10-CM | POA: Insufficient documentation

## 2016-10-15 DIAGNOSIS — I429 Cardiomyopathy, unspecified: Secondary | ICD-10-CM | POA: Insufficient documentation

## 2016-10-15 DIAGNOSIS — F329 Major depressive disorder, single episode, unspecified: Secondary | ICD-10-CM | POA: Insufficient documentation

## 2016-10-15 DIAGNOSIS — Z720 Tobacco use: Secondary | ICD-10-CM

## 2016-10-15 DIAGNOSIS — E785 Hyperlipidemia, unspecified: Secondary | ICD-10-CM | POA: Insufficient documentation

## 2016-10-15 DIAGNOSIS — Z7901 Long term (current) use of anticoagulants: Secondary | ICD-10-CM | POA: Insufficient documentation

## 2016-10-15 DIAGNOSIS — N183 Chronic kidney disease, stage 3 unspecified: Secondary | ICD-10-CM

## 2016-10-15 DIAGNOSIS — I5022 Chronic systolic (congestive) heart failure: Secondary | ICD-10-CM

## 2016-10-15 DIAGNOSIS — Z833 Family history of diabetes mellitus: Secondary | ICD-10-CM | POA: Insufficient documentation

## 2016-10-15 DIAGNOSIS — Z808 Family history of malignant neoplasm of other organs or systems: Secondary | ICD-10-CM | POA: Insufficient documentation

## 2016-10-15 DIAGNOSIS — E784 Other hyperlipidemia: Secondary | ICD-10-CM

## 2016-10-15 DIAGNOSIS — F419 Anxiety disorder, unspecified: Secondary | ICD-10-CM | POA: Insufficient documentation

## 2016-10-15 DIAGNOSIS — I130 Hypertensive heart and chronic kidney disease with heart failure and stage 1 through stage 4 chronic kidney disease, or unspecified chronic kidney disease: Secondary | ICD-10-CM | POA: Insufficient documentation

## 2016-10-15 DIAGNOSIS — K219 Gastro-esophageal reflux disease without esophagitis: Secondary | ICD-10-CM | POA: Insufficient documentation

## 2016-10-15 DIAGNOSIS — E7849 Other hyperlipidemia: Secondary | ICD-10-CM

## 2016-10-15 DIAGNOSIS — Z803 Family history of malignant neoplasm of breast: Secondary | ICD-10-CM | POA: Insufficient documentation

## 2016-10-15 DIAGNOSIS — G4733 Obstructive sleep apnea (adult) (pediatric): Secondary | ICD-10-CM | POA: Insufficient documentation

## 2016-10-15 DIAGNOSIS — Z8249 Family history of ischemic heart disease and other diseases of the circulatory system: Secondary | ICD-10-CM | POA: Insufficient documentation

## 2016-10-15 DIAGNOSIS — Z86711 Personal history of pulmonary embolism: Secondary | ICD-10-CM | POA: Insufficient documentation

## 2016-10-15 DIAGNOSIS — J45909 Unspecified asthma, uncomplicated: Secondary | ICD-10-CM | POA: Insufficient documentation

## 2016-10-15 DIAGNOSIS — I10 Essential (primary) hypertension: Secondary | ICD-10-CM

## 2016-10-15 DIAGNOSIS — I2510 Atherosclerotic heart disease of native coronary artery without angina pectoris: Secondary | ICD-10-CM | POA: Insufficient documentation

## 2016-10-15 DIAGNOSIS — F1290 Cannabis use, unspecified, uncomplicated: Secondary | ICD-10-CM | POA: Insufficient documentation

## 2016-10-15 DIAGNOSIS — Z79899 Other long term (current) drug therapy: Secondary | ICD-10-CM | POA: Insufficient documentation

## 2016-10-15 DIAGNOSIS — I5042 Chronic combined systolic (congestive) and diastolic (congestive) heart failure: Secondary | ICD-10-CM | POA: Insufficient documentation

## 2016-10-15 DIAGNOSIS — F1010 Alcohol abuse, uncomplicated: Secondary | ICD-10-CM | POA: Insufficient documentation

## 2016-10-15 DIAGNOSIS — F17210 Nicotine dependence, cigarettes, uncomplicated: Secondary | ICD-10-CM | POA: Insufficient documentation

## 2016-10-15 LAB — BASIC METABOLIC PANEL
Anion gap: 14 (ref 5–15)
BUN: 30 mg/dL — ABNORMAL HIGH (ref 6–20)
CALCIUM: 9.3 mg/dL (ref 8.9–10.3)
CHLORIDE: 89 mmol/L — AB (ref 101–111)
CO2: 29 mmol/L (ref 22–32)
CREATININE: 2.11 mg/dL — AB (ref 0.61–1.24)
GFR calc non Af Amer: 38 mL/min — ABNORMAL LOW (ref >60)
GFR, EST AFRICAN AMERICAN: 44 mL/min — AB (ref >60)
GLUCOSE: 148 mg/dL — AB (ref 65–99)
Potassium: 3.5 mmol/L (ref 3.5–5.1)
Sodium: 132 mmol/L — ABNORMAL LOW (ref 135–145)

## 2016-10-15 MED ORDER — CARVEDILOL 6.25 MG PO TABS: 6.2500 mg | ORAL_TABLET | Freq: Two times a day (BID) | ORAL | 3 refills | Status: DC

## 2016-10-15 NOTE — Telephone Encounter (Signed)
Pt left without checking out, called pt no VM, I will send pt appt date and time for 4 week f/u W/ ECHO

## 2016-10-15 NOTE — Patient Instructions (Signed)
INCREASE Carvedilol to 6.25 mg Two Times Daily.    Labs today (will call for abnormal results, otherwise no news is good news)  Follow up in [redacted] weeks along with Echo.

## 2016-10-15 NOTE — Progress Notes (Signed)
PCP: Dr Quay Burow  Primary Cardiologist: Dr Martinique  HF MD: Dr Haroldine Laws   HPI: Donald Brady is a 41 year old with a history of chronic systolic heart failure (suspected ETOH), NICM. PE 2016, factor V leiden, asthma, htn, osa, ckd stage III, major depression disorder, and ETOH abuse. LHC 2016 non obstructive cad. No family history of heart disease.   Hospitalized in July 0000000 with A/C systolic heart failure. Left AMA. Admitted January 2nd with A/C systolic heart failure. Diuresed with IV lasix and transitioned to lasix 40 mg daily. Discharge weight was  383 pounds.  Readmitted January 8th, 2018 with increased dyspnea and marked volume overload. Diuresed with IV lasix and started on dig, spiro, and bidil. Discharge 364 pounds.   Evaluated in [facility] January 15th due to difficulty urinating. Given IV lasix. UA was negative for UTI.   He returns for HF follow up. Denies SOB. Had sleep study last week. .Sleeps with HOB elevated. Sleeps on the floor with head on the cough. Weight at home 358-362 pounds. He has cut back alcohol to one can a day. Intermittently follows low salt diet. Smoking 1 pack per week. Smoking marijuana weekly. Taking all medications. Followed by Paramedicine.   Review of Systems  Constitutional: Positive for malaise/fatigue.  HENT: Negative.   Eyes: Negative.   Respiratory: Positive for cough.   Cardiovascular: Positive for orthopnea and leg swelling.  Gastrointestinal: Negative.   Genitourinary: Negative.   Musculoskeletal: Positive for back pain.  Skin: Negative.   Neurological: Positive for headaches.  Endo/Heme/Allergies: Negative.   Psychiatric/Behavioral: Positive for depression and substance abuse.    ECHO 08/18/2016: EF 20%  RV mildly dilated and moderately HK . Grade II DD  LHC 02/2015:  1. 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 Moderate LV dysfunction   SH:  Social History   Social History  . Marital status: Single   Spouse name: N/A  . Number of children: 1  . Years of education: N/A   Occupational History  . Disabled- Architect   .  Other   Social History Main Topics  . Smoking status: Former Smoker    Packs/day: 0.10    Years: 21.00    Types: Cigarettes    Quit date: 07/31/2016  . Smokeless tobacco: Never Used     Comment: a pack a week  . Alcohol use 0.0 oz/week     Comment: everyday  . Drug use: Yes     Comment: Marijuana.  . Sexual activity: Not on file   Other Topics Concern  . Not on file   Social History Narrative   Lives in [place] with his dog.  Has a 56 year old daughter that he sees daily.  Has good relationship with daughter's mom.  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     FH:  Family History  Problem Relation Age of Onset  . Diabetes Mother   . Hypertension Mother   . Hearing loss Father     died in his 61's  . Cancer Father     unknown type  . Clotting disorder Father   . Heart disease Father   . Diabetes Maternal Aunt   . Hypertension Maternal Aunt   . Diabetes Maternal Uncle   . Hypertension Maternal Uncle   . Stomach cancer Maternal Uncle   . Lung cancer Paternal Barbaraann Rondo     was a smoker  . Breast cancer  Maternal Aunt   . Stomach cancer Maternal Uncle   . Liver disease Maternal Uncle     drinker  . Kidney disease Maternal Aunt     Past Medical History:  Diagnosis Date  . Anxiety   . Arthritis    left knee  . Asthma    no inhaler use in 1 year  . CHF (congestive heart failure) ([place])   . Factor V Leiden ([place])   . GERD (gastroesophageal reflux disease)   . History of pulmonary embolus (PE)    takes Xarelto  . History of seizures    no known cause, per pt. - has been "years" since last seizure; no longer on anticonvulsant  . HLD (hyperlipidemia)   . Hypertension    states BP "runs high"; has been on med. "a while"  . Lateral meniscus tear 01/2015   left knee  . Shortness of breath dyspnea   . Sleep apnea    had  sleep study 10/2013:  "severe" sleep apnea, states could not afford CPAP machine    Current Outpatient Prescriptions  Medication Sig Dispense Refill  . albuterol (PROVENTIL HFA;VENTOLIN HFA) 108 (90 Base) MCG/ACT inhaler Inhale 2 puffs into the lungs every 6 (six) hours as needed for wheezing or shortness of breath. 6.7 g 3  . atorvastatin (LIPITOR) 40 MG tablet Take 1 tablet (40 mg total) by mouth daily. 90 tablet 3  . buPROPion (WELLBUTRIN XL) 150 MG 24 hr tablet Take 1 tablet (150 mg total) by mouth daily. IM program, [facility] 30 tablet 0  . carvedilol (COREG) 3.125 MG tablet Take 1 tablet (3.125 mg total) by mouth 2 (two) times daily. 60 tablet 2  . digoxin (LANOXIN) 0.125 MG tablet Take 1 tablet (0.125 mg total) by mouth daily. 30 tablet 6  . folic acid (FOLVITE) 1 MG tablet Take 1 tablet (1 mg total) by mouth daily. 90 tablet 3  . furosemide (LASIX) 40 MG tablet Take 80 mg by mouth 2 (two) times daily.    . hydrALAZINE (APRESOLINE) 25 MG tablet Take 1.5 tablets (37.5 mg total) by mouth 3 (three) times daily. 405 tablet 3  . isosorbide mononitrate (IMDUR) 30 MG 24 hr tablet Take 30 mg by mouth daily.    . metolazone (ZAROXOLYN) 2.5 MG tablet Take 1 tablet (2.5 mg total) by mouth 3 (three) times a week. Tuesday, Thursday, Saturday 15 tablet 2  . mometasone-formoterol (DULERA) 200-5 MCG/ACT AERO Inhale 2 puffs into the lungs 2 (two) times daily. 8.8 g 3  . Multiple Vitamin (MULTIVITAMIN) tablet Take 1 tablet by mouth daily.    . potassium chloride SA (K-DUR,KLOR-CON) 20 MEQ tablet Take 40 meq (2 tabs) in the AM and 20 meq one tab in the PM.Take an add'l 20 mEq (1 tablet) when take metolazone 72 tablet 0  . rivaroxaban (XARELTO) 20 MG TABS tablet Take 1 tablet (20 mg total) by mouth daily with supper. 90 tablet 3  . spironolactone (ALDACTONE) 25 MG tablet Take 1 tablet (25 mg total) by mouth daily. 30 tablet 6  . venlafaxine XR (EFFEXOR-XR) 75 MG 24 hr capsule Take 75 mg by mouth every other  day.    . nicotine (NICOTROL) 10 MG inhaler Inhale 1 Cartridge (1 continuous puffing total) into the lungs as needed for smoking cessation. Use at least 6 cartridges daily x 3-4 weeks (Patient not taking: Reported on 09/30/2016) 504 each 2   No current facility-administered medications for this encounter.     Vitals:  10/15/16 0924  BP: 122/90  Pulse: (!) 116  SpO2: 97%  Weight: (!) 364 lb 9.6 oz (165.4 kg)   Wt Readings from Last 3 Encounters:  10/15/16 (!) 364 lb 9.6 oz (165.4 kg)  10/08/16 (!) 360 lb (163.3 kg)  10/07/16 (!) 362 lb (164.2 kg)     PHYSICAL EXAM: General:  Obese. Walked in the clinic without difficulty.  HEENT: Normal Neck: supple. JVP 5-6 Carotids 2+ bilaterally; no bruits. No thyromegaly or nodule noted.  Cor: PMI normal. RRR. No rubs or murmurs. Soft  S3  Lungs: CTAB Abdomen: obese, NT, ND. no HSM. No bruits or masses. +BS  Extremities: no cyanosis, clubbing, rash,  R and LLE no edema. Warm  Neuro: alert & orientedx3, cranial nerves grossly intact. Moves all 4 extremities w/o difficulty. Affect pleasant.  ASSESSMENT & PLAN: 1. Chronic Combined Systolic/Diastolic HF- NICM , LHC [id number] Non obs CAD. ECHO EF 20% RV moderate HK. Reduced EF likely from ETOH abuse and HTN.  - NYHA II-III. Seems to be a little better.  Volume status stable. Continue lasix 80 mg BID. Continue Metolazone 2.5 mg Tues/Thur/Sat.  - Continue potassium 20 meq twice a day.  - Continue spiro 25 mg daily.  - Continue hydralazine 37.5 mg TID.  - Continue Imdur  30 mg daily.   -Increase coreg to 6.25 mg twice a day.   - BMET today. Reinforced daily weights, low salt food choices, and limiting fluid intake to < 2 liters per day.   2. CKD II - Baseline 1.5. Check BMET today.    3. H/O PE 2016- chronic xarelto. No bleeding problems.   4. OSA- had recent sleep study. Waiting on new CPAP.   5. Asthma 6. Factor V Leiden 7. Major Depressive Disorder 8. ETOH abuse - Ongoing alcohol abuse but he  continues to cut back. He declines rehab.   9. Morbid obesity - Needs to reduce portions and increase activity as able.   10. Smoker - He declines smoking cessation.   11. HTN:  Stable. Continue current regimen.   Today he seems to be doing much better. Improved compliacne. Continue paramedicine.  Follow up in 4 weeks with an ECHO and Dr Haroldine Laws. He understands if EF remains low will need referral for ICD.   Greater than 50% of the (total minutes 25) visit spent in counseling/coordination of care regarding heart failure.     Darrick Grinder, NP  9:42 AM

## 2016-10-15 NOTE — Progress Notes (Signed)
Paramedicine Encounter    Patient ID: Donald Brady, male    DOB: Sep 23, 1975, 41 y.o.   MRN: [id number]   Patient Care Team: Florinda Marker, MD as PCP - General  Patient Active Problem List   Diagnosis Date Noted  . Back pain   . Morbid obesity due to excess calories ([place])   . Presbyopia 01/26/2016  . Alcohol abuse 01/26/2016  . Hypertensive heart disease 10/01/2015  . Chronic systolic CHF (congestive heart failure) ([place]) 10/01/2015  . Umbilical hernia without obstruction and without gangrene 08/29/2015  . Pre-diabetes 08/29/2015  . CKD (chronic kidney disease), stage III 08/19/2015  . Nonobstructive cardiomyopathy ([place]) 05/05/2015  . Cocaine use 12/23/2014  . Depression 10/10/2014  . GERD (gastroesophageal reflux disease) 07/24/2014  . Polycythemia, secondary 05/23/2014  . Factor 5 Leiden mutation, heterozygous ([place]) 05/22/2014  . Hyperlipidemia 04/23/2014  . Sinusitis, chronic 04/23/2014  . Severe obstructive sleep apnea 11/20/2013  . Essential hypertension, benign 08/04/2013  . Asthma 02/20/2013  . Osteoarthritis of left knee 06/28/2012  . Long term current use of anticoagulant therapy 12/02/2011  . Bilateral pulmonary embolism ([place]) 11/27/2011  . Tobacco abuse 11/27/2011    Current Outpatient Prescriptions:  .  albuterol (PROVENTIL HFA;VENTOLIN HFA) 108 (90 Base) MCG/ACT inhaler, Inhale 2 puffs into the lungs every 6 (six) hours as needed for wheezing or shortness of breath., Disp: 6.7 g, Rfl: 3 .  atorvastatin (LIPITOR) 40 MG tablet, Take 1 tablet (40 mg total) by mouth daily., Disp: 90 tablet, Rfl: 3 .  buPROPion (WELLBUTRIN XL) 150 MG 24 hr tablet, Take 1 tablet (150 mg total) by mouth daily. IM program, [facility], Disp: 30 tablet, Rfl: 0 .  carvedilol (COREG) 6.25 MG tablet, Take 1 tablet (6.25 mg total) by mouth 2 (two) times daily., Disp: 60 tablet, Rfl: 3 .  digoxin (LANOXIN) 0.125 MG tablet, Take 1 tablet (0.125 mg total) by mouth daily., Disp: 30 tablet, Rfl: 6 .   folic acid (FOLVITE) 1 MG tablet, Take 1 tablet (1 mg total) by mouth daily., Disp: 90 tablet, Rfl: 3 .  furosemide (LASIX) 40 MG tablet, Take 80 mg by mouth 2 (two) times daily., Disp: , Rfl:  .  hydrALAZINE (APRESOLINE) 25 MG tablet, Take 1.5 tablets (37.5 mg total) by mouth 3 (three) times daily., Disp: 405 tablet, Rfl: 3 .  isosorbide mononitrate (IMDUR) 30 MG 24 hr tablet, Take 30 mg by mouth daily., Disp: , Rfl:  .  metolazone (ZAROXOLYN) 2.5 MG tablet, Take 1 tablet (2.5 mg total) by mouth 3 (three) times a week. Tuesday, Thursday, Saturday, Disp: 15 tablet, Rfl: 2 .  mometasone-formoterol (DULERA) 200-5 MCG/ACT AERO, Inhale 2 puffs into the lungs 2 (two) times daily., Disp: 8.8 g, Rfl: 3 .  Multiple Vitamin (MULTIVITAMIN) tablet, Take 1 tablet by mouth daily., Disp: , Rfl:  .  nicotine (NICOTROL) 10 MG inhaler, Inhale 1 Cartridge (1 continuous puffing total) into the lungs as needed for smoking cessation. Use at least 6 cartridges daily x 3-4 weeks (Patient not taking: Reported on 09/30/2016), Disp: 504 each, Rfl: 2 .  potassium chloride SA (K-DUR,KLOR-CON) 20 MEQ tablet, Take 40 meq (2 tabs) in the AM and 20 meq one tab in the PM.Take an add'l 20 mEq (1 tablet) when take metolazone, Disp: 72 tablet, Rfl: 0 .  rivaroxaban (XARELTO) 20 MG TABS tablet, Take 1 tablet (20 mg total) by mouth daily with supper., Disp: 90 tablet, Rfl: 3 .  spironolactone (ALDACTONE) 25 MG tablet, Take  1 tablet (25 mg total) by mouth daily., Disp: 30 tablet, Rfl: 6 .  venlafaxine XR (EFFEXOR-XR) 75 MG 24 hr capsule, Take 75 mg by mouth every other day., Disp: , Rfl:  Allergies  Allergen Reactions  . Bee Venom Anaphylaxis  . Honey Anaphylaxis    RAW HONEY  . Shellfish Allergy Shortness Of Breath and Swelling  . Ibuprofen Hives and Swelling  . Betadine [Povidone Iodine]     BECAUSE OF SHELLFISH ALLERGY     Social History   Social History  . Marital status: Single    Spouse name: N/A  . Number of children: 1   . Years of education: N/A   Occupational History  . Disabled- Architect   .  Other   Social History Main Topics  . Smoking status: Former Smoker    Packs/day: 0.10    Years: 21.00    Types: Cigarettes    Quit date: 07/31/2016  . Smokeless tobacco: Never Used     Comment: a pack a week  . Alcohol use 0.0 oz/week     Comment: everyday  . Drug use: Yes     Comment: Marijuana.  . Sexual activity: Not on file   Other Topics Concern  . Not on file   Social History Narrative   Lives in [place] with his dog.  Has a 65 year old daughter that he sees daily.  Has good relationship with daughter's mom.  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     Physical Exam      SAFE - 09/30/16 1100      Situation   Admitting diagnosis chf   Heart failure history Exisiting   Readmitted within 30 days Yes   Hospital admission within past 12 months Yes   number of hospital admissions 5   number of ED visits 10     Assessment   Lives alone No   Primary support person Mom/step-dad/girlfriend   Mode of transportation personal car   Other services involved None   Home equipement Scale     Weight   Weighs self daily Yes   Scale provided No  has own scale   Records on weight chart No     Resources   Has "Living better w/heart failure" book Yes   Has HF Zone tool Yes   Able to identify yellow zone signs/when to call MD Yes   Records zone daily No     Medications   Uses a pill box Yes   Who stocks the pill box Self   Pill box checked this visit Yes   Pill box refilled this visit No   Difficulty obtaining medications No   Mail order medications Yes   New medications at home today No   Next scheduled delivery of medications 10/07/16   Missed one or more doses of medications per week Yes   How many missed doses this week 2  Xarelto     Nutrition   Patient receives meals on wheels No   Patient follows low sodium diet No   Has foods at home that meet  the current recommended diet Yes   Patient follows low sugar/card diet Yes   Nutritional concerns/issues n     Activity Level   ADL's/Mobility Independent   How many feet can patient ambulate >60f    Typical activity level Active   Barriers None   Actvity tolerance: NYHA Class 2     Urine  Difficulty urinating No   Changes in urine None     Time spent with patient   Time spent with patient  45 Minutes        Met pt in clinic ATF pt CAP x4 complaining of being tired due to chasing his dog around last night into the early morning.  Pt stated that he is feeling a lot better today. New anxiety medications is working and he feel better and he is down to One can of beer a week.  Pt stated that he goes to [place] at the [employer].  Pt has been alcohol free for six days so far.  Pt denies dizziness, chest pain, headache, and sob. No swelling noted to pt's legs or feet.  Pt stated that the [facility] social worker is working on him getting a CPAP.   Amy: Talked to pt about alcohol consumption and low sodium diet.  Pt reports that he went to [place] [employer] over the weekend and ate chips at BB parties.   *increased carvedilol 2 pills twice daily  **katie check pt's bottles to make sure he is taking pills correctly (carvediol). Susie called in new prescription and advise that pt continue taking the pills that he has until he run out.    Future Appointments Date Time Provider [place]  11/17/2016 9:00 AM [facility] ECHO 1-BUZZ [facility]  11/17/2016 10:20 AM Jolaine Artist, MD [facility] None    BP 122/90   Pulse (!) 116   Wt (!) 364 lb (165.1 kg)   SpO2 97%   BMI 45.50 kg/m   Weight yesterday-364     ACTION: Home visit completed

## 2016-10-17 DIAGNOSIS — G4730 Sleep apnea, unspecified: Secondary | ICD-10-CM

## 2016-10-17 NOTE — Procedures (Signed)
Patient Name: Donald Brady, [place] Date: 10/08/2016 Gender: Male D.O.B: [birth year] Age (years): 40 Referring Provider: Murriel Hopper Height (inches): 67 Interpreting Physician: Baird Lyons MD, ABSM Weight (lbs): 360 RPSGT: Baxter Flattery BMI: 6 MRN: [id number] Neck Size: 20.00 CLINICAL INFORMATION The patient is referred for a split night study with BPAP.  MEDICATIONS Medications self-administered by patient taken the night of the study : none reported  SLEEP STUDY TECHNIQUE As per the AASM Manual for the Scoring of Sleep and Associated Events v2.3 (April 2016) with a hypopnea requiring 4% desaturations.  The channels recorded and monitored were frontal, central and occipital EEG, electrooculogram (EOG), submentalis EMG (chin), nasal and oral airflow, thoracic and abdominal wall motion, anterior tibialis EMG, snore microphone, electrocardiogram, and pulse oximetry. Bi-level positive airway pressure (BiPAP) was initiated when the patient met split night criteria and was titrated according to treat sleep-disordered breathing.  RESPIRATORY PARAMETERS Diagnostic  Total AHI (/hr): 42.9 RDI (/hr): 59.4 OA Index (/hr): 6.2 CA Index (/hr): 0.0 REM AHI (/hr): N/A NREM AHI (/hr): 42.9 Supine AHI (/hr): 55.1 Non-supine AHI (/hr): 49.38 Min O2 Sat (%): 85.00 Mean O2 (%): 91.00 Time below 88% (min): 21.9   Titration  Optimal IPAP Pressure (cm): 18 Optimal EPAP Pressure (cm): 14 AHI at Optimal Pressure (/hr): 0.9 Min O2 at Optimal Pressure (%): 86.0 Sleep % at Optimal (%): 98 Supine % at Optimal (%): 0      SLEEP ARCHITECTURE The study was initiated at 9:46:03 PM and terminated at 4:45:42 AM. The total recorded time was 419.6 minutes. EEG confirmed total sleep time was 365.7 minutes yielding a sleep efficiency of 87.2%. Sleep onset after lights out was 2.9 minutes with a REM latency of 351.5 minutes. The patient spent 25.43% of the night in stage N1 sleep, 59.67% in stage N2 sleep,  0.00% in stage N3 and 14.90% in REM. Wake after sleep onset (WASO) was 51.0 minutes. The Arousal Index was 50.4/hour.  LEG MOVEMENT DATA The total Periodic Limb Movements of Sleep (PLMS) were 0. The PLMS index was 0.00 .  CARDIAC DATA The 2 lead EKG demonstrated sinus rhythm. The mean heart rate was 95.63 beats per minute. Other EKG findings include: None.  IMPRESSIONS - Severe obstructive sleep apnea occurred during the diagnostic portion of the study (AHI = 42.9 /hour). - CPAP did not provide adequate pressure control. An optimal BiPAP pressure was selected for this patient ( 18 / 14 cm of water) - No significant central sleep apnea occurred during the diagnostic portion of the study (CAI = 0.0/hour). - Severe oxygen desaturation was noted during the diagnostic portion of the study (Min O2 = 85.00%). - The patient snored with Soft snoring volume during the diagnostic portion of the study. - No cardiac abnormalities were noted during this study. - Clinically significant periodic limb movements of sleep did not occur during the study.  DIAGNOSIS - Obstructive Sleep Apnea (327.23 [G47.33 ICD-10])  RECOMMENDATIONS - Trial of BiPAP therapy on 18/14 cm H2O with a Large size Resmed Full Face Mask AirFit F20 mask and heated humidification. - Avoid alcohol, sedatives and other CNS depressants that may worsen sleep apnea and disrupt normal sleep architecture. - Sleep hygiene should be reviewed to assess factors that may improve sleep quality. - Weight management and regular exercise should be initiated or continued.  [Electronically signed] 10/17/2016 08:57 AM  Baird Lyons MD, ABSM Diplomate, American Board of Sleep Medicine   NPI: [id number]  [place], American Board of Sleep Medicine  ELECTRONICALLY SIGNED ON:  10/17/2016, 8:58 AM [facility] PH: [phone]   FX: [phone] [place]

## 2016-10-20 ENCOUNTER — Other Ambulatory Visit (HOSPITAL_COMMUNITY): Payer: Self-pay

## 2016-10-20 NOTE — Progress Notes (Signed)
Paramedicine Encounter    Patient ID: Donald Brady, male    DOB: [birth year], 41 y.o.   MRN: [id number]   Patient Care Team: Florinda Marker, MD as PCP - General  Patient Active Problem List   Diagnosis Date Noted  . Back pain   . Morbid obesity due to excess calories ([place])   . Presbyopia 01/26/2016  . Alcohol abuse 01/26/2016  . Hypertensive heart disease 10/01/2015  . Chronic systolic CHF (congestive heart failure) ([place]) 10/01/2015  . Umbilical hernia without obstruction and without gangrene 08/29/2015  . Pre-diabetes 08/29/2015  . CKD (chronic kidney disease), stage III 08/19/2015  . Nonobstructive cardiomyopathy ([place]) 05/05/2015  . Cocaine use 12/23/2014  . Depression 10/10/2014  . GERD (gastroesophageal reflux disease) 07/24/2014  . Polycythemia, secondary 05/23/2014  . Factor 5 Leiden mutation, heterozygous ([place]) 05/22/2014  . Hyperlipidemia 04/23/2014  . Sinusitis, chronic 04/23/2014  . Severe obstructive sleep apnea 11/20/2013  . Essential hypertension, benign 08/04/2013  . Asthma 02/20/2013  . Osteoarthritis of left knee 06/28/2012  . Long term current use of anticoagulant therapy 12/02/2011  . Bilateral pulmonary embolism ([place]) 11/27/2011  . Tobacco abuse 11/27/2011    Current Outpatient Prescriptions:  .  albuterol (PROVENTIL HFA;VENTOLIN HFA) 108 (90 Base) MCG/ACT inhaler, Inhale 2 puffs into the lungs every 6 (six) hours as needed for wheezing or shortness of breath., Disp: 6.7 g, Rfl: 3 .  atorvastatin (LIPITOR) 40 MG tablet, Take 1 tablet (40 mg total) by mouth daily., Disp: 90 tablet, Rfl: 3 .  buPROPion (WELLBUTRIN XL) 150 MG 24 hr tablet, Take 1 tablet (150 mg total) by mouth daily. IM program, [facility], Disp: 30 tablet, Rfl: 0 .  carvedilol (COREG) 6.25 MG tablet, Take 1 tablet (6.25 mg total) by mouth 2 (two) times daily., Disp: 60 tablet, Rfl: 3 .  digoxin (LANOXIN) 0.125 MG tablet, Take 1 tablet (0.125 mg total) by mouth daily., Disp: 30 tablet, Rfl: 6 .   folic acid (FOLVITE) 1 MG tablet, Take 1 tablet (1 mg total) by mouth daily., Disp: 90 tablet, Rfl: 3 .  furosemide (LASIX) 40 MG tablet, Take 80 mg by mouth 2 (two) times daily., Disp: , Rfl:  .  hydrALAZINE (APRESOLINE) 25 MG tablet, Take 1.5 tablets (37.5 mg total) by mouth 3 (three) times daily., Disp: 405 tablet, Rfl: 3 .  isosorbide mononitrate (IMDUR) 30 MG 24 hr tablet, Take 30 mg by mouth daily., Disp: , Rfl:  .  metolazone (ZAROXOLYN) 2.5 MG tablet, Take 1 tablet (2.5 mg total) by mouth 3 (three) times a week. Tuesday, Thursday, Saturday, Disp: 15 tablet, Rfl: 2 .  mometasone-formoterol (DULERA) 200-5 MCG/ACT AERO, Inhale 2 puffs into the lungs 2 (two) times daily., Disp: 8.8 g, Rfl: 3 .  Multiple Vitamin (MULTIVITAMIN) tablet, Take 1 tablet by mouth daily., Disp: , Rfl:  .  nicotine (NICOTROL) 10 MG inhaler, Inhale 1 Cartridge (1 continuous puffing total) into the lungs as needed for smoking cessation. Use at least 6 cartridges daily x 3-4 weeks, Disp: 504 each, Rfl: 2 .  potassium chloride SA (K-DUR,KLOR-CON) 20 MEQ tablet, Take 40 meq (2 tabs) in the AM and 20 meq one tab in the PM.Take an add'l 20 mEq (1 tablet) when take metolazone, Disp: 72 tablet, Rfl: 0 .  rivaroxaban (XARELTO) 20 MG TABS tablet, Take 1 tablet (20 mg total) by mouth daily with supper., Disp: 90 tablet, Rfl: 3 .  spironolactone (ALDACTONE) 25 MG tablet, Take 1 tablet (25 mg total) by  mouth daily., Disp: 30 tablet, Rfl: 6 .  venlafaxine XR (EFFEXOR-XR) 75 MG 24 hr capsule, Take 75 mg by mouth every other day. , Disp: , Rfl:  Allergies  Allergen Reactions  . Bee Venom Anaphylaxis  . Honey Anaphylaxis    RAW HONEY  . Shellfish Allergy Shortness Of Breath and Swelling  . Ibuprofen Hives and Swelling  . Betadine [Povidone Iodine]     BECAUSE OF SHELLFISH ALLERGY     Social History   Social History  . Marital status: Single    Spouse name: N/A  . Number of children: 1  . Years of education: N/A    Occupational History  . Disabled- Architect   .  Other   Social History Main Topics  . Smoking status: Former Smoker    Packs/day: 0.10    Years: 21.00    Types: Cigarettes    Quit date: 07/31/2016  . Smokeless tobacco: Never Used     Comment: a pack a week  . Alcohol use 0.0 oz/week     Comment: everyday  . Drug use: Yes     Comment: Marijuana.  . Sexual activity: Not on file   Other Topics Concern  . Not on file   Social History Narrative   Lives in [place] with his dog.  Has a 63 year old daughter that he sees daily.  Has good relationship with daughter's mom.  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     Physical Exam  Constitutional: He is oriented to person, place, and time. He appears well-developed.  Pulmonary/Chest: Effort normal and breath sounds normal.  Abdominal: Soft. Bowel sounds are normal.  Musculoskeletal: Normal range of motion.  Neurological: He is oriented to person, place, and time.  Skin: Skin is warm and dry.        SAFE - 09/30/16 1100      Situation   Admitting diagnosis chf   Heart failure history Exisiting   Readmitted within 30 days Yes   Hospital admission within past 12 months Yes   number of hospital admissions 5   number of ED visits 10     Assessment   Lives alone No   Primary support person Mom/step-dad/girlfriend   Mode of transportation personal car   Other services involved None   Home equipement Scale     Weight   Weighs self daily Yes   Scale provided No  has own scale   Records on weight chart No     Resources   Has "Living better w/heart failure" book Yes   Has HF Zone tool Yes   Able to identify yellow zone signs/when to call MD Yes   Records zone daily No     Medications   Uses a pill box Yes   Who stocks the pill box Self   Pill box checked this visit Yes   Pill box refilled this visit No   Difficulty obtaining medications No   Mail order medications Yes   New  medications at home today No   Next scheduled delivery of medications 10/07/16   Missed one or more doses of medications per week Yes   How many missed doses this week 2  Xarelto     Nutrition   Patient receives meals on wheels No   Patient follows low sodium diet No   Has foods at home that meet the current recommended diet Yes   Patient follows low sugar/card diet  Yes   Nutritional concerns/issues n     Activity Level   ADL's/Mobility Independent   How many feet can patient ambulate >34ft    Typical activity level Active   Barriers None   Actvity tolerance: NYHA Class 2     Urine   Difficulty urinating No   Changes in urine None     Time spent with patient   Time spent with patient  45 Minutes        Future Appointments Date Time Provider [place]  11/17/2016 9:00 AM MC ECHO 1-BUZZ [facility]  11/17/2016 10:20 AM Jolaine Artist, MD [facility] None   BP 118/90   Pulse (!) 102   Resp 16   Wt (!) 362 lb (164.2 kg)   SpO2 98%   BMI 45.25 kg/m  Weight yesterday-361 Last visit weight-364 CBG-166   Donald Brady is seen at home today and reports last night he felt his heart racing fast that lasted approx 10-15 minutes. He reports he has been drinking more since this past weekend and is trying to get to [place] meeting this evening. He says he wants to "be admitted to the hospital for a few days after flu season" so he can get help through his withdrawal symptoms. He denies SOB, dizziness, or headaches. His medications were verified and his pillbox was filled.   Jacquiline Doe, EMT -Paramedic Marylouise Stacks, EMT-Paramedic 10/20/16  ACTION: Home visit completed Next visit planned for 1 week

## 2016-10-28 ENCOUNTER — Other Ambulatory Visit: Payer: Self-pay | Admitting: Pharmacist

## 2016-10-28 ENCOUNTER — Other Ambulatory Visit (HOSPITAL_COMMUNITY): Payer: Self-pay

## 2016-10-28 ENCOUNTER — Other Ambulatory Visit: Payer: Self-pay | Admitting: Internal Medicine

## 2016-10-28 DIAGNOSIS — F32A Depression, unspecified: Secondary | ICD-10-CM

## 2016-10-28 DIAGNOSIS — F329 Major depressive disorder, single episode, unspecified: Secondary | ICD-10-CM

## 2016-10-28 MED ORDER — BUPROPION HCL ER (XL) 150 MG PO TB24: 150.0000 mg | ORAL_TABLET | Freq: Every day | ORAL | 1 refills | Status: DC

## 2016-10-28 MED FILL — metOLazone 2.5 MG TABS: 2.5 | 30 days supply | Qty: 15 | Fill #1

## 2016-10-28 MED FILL — BUPROPION HCL XL 150 MG TAB: 150 | 30 days supply | Qty: 30 | Fill #0

## 2016-10-28 NOTE — Telephone Encounter (Signed)
Rx does have IM Program on it.

## 2016-10-28 NOTE — Telephone Encounter (Signed)
Needs IM Program on rx. Thanks

## 2016-10-28 NOTE — Progress Notes (Signed)
Paramedicine Encounter    Patient ID: Donald Brady, male    DOB: Feb 15, 1976, 41 y.o.   MRN: [id number]   Patient Care Team: Florinda Marker, MD as PCP - General  Patient Active Problem List   Diagnosis Date Noted  . Back pain   . Morbid obesity due to excess calories ([place])   . Presbyopia 01/26/2016  . Alcohol abuse 01/26/2016  . Hypertensive heart disease 10/01/2015  . Chronic systolic CHF (congestive heart failure) ([place]) 10/01/2015  . Umbilical hernia without obstruction and without gangrene 08/29/2015  . Pre-diabetes 08/29/2015  . CKD (chronic kidney disease), stage III 08/19/2015  . Nonobstructive cardiomyopathy ([place]) 05/05/2015  . Cocaine use 12/23/2014  . Depression 10/10/2014  . GERD (gastroesophageal reflux disease) 07/24/2014  . Polycythemia, secondary 05/23/2014  . Factor 5 Leiden mutation, heterozygous ([place]) 05/22/2014  . Hyperlipidemia 04/23/2014  . Sinusitis, chronic 04/23/2014  . Severe obstructive sleep apnea 11/20/2013  . Essential hypertension, benign 08/04/2013  . Asthma 02/20/2013  . Osteoarthritis of left knee 06/28/2012  . Long term current use of anticoagulant therapy 12/02/2011  . Bilateral pulmonary embolism ([place]) 11/27/2011  . Tobacco abuse 11/27/2011    Current Outpatient Prescriptions:  .  albuterol (PROVENTIL HFA;VENTOLIN HFA) 108 (90 Base) MCG/ACT inhaler, Inhale 2 puffs into the lungs every 6 (six) hours as needed for wheezing or shortness of breath., Disp: 6.7 g, Rfl: 3 .  atorvastatin (LIPITOR) 40 MG tablet, Take 1 tablet (40 mg total) by mouth daily., Disp: 90 tablet, Rfl: 3 .  buPROPion (WELLBUTRIN XL) 150 MG 24 hr tablet, Take 1 tablet (150 mg total) by mouth daily. IM program, [facility], Disp: 30 tablet, Rfl: 1 .  carvedilol (COREG) 6.25 MG tablet, Take 1 tablet (6.25 mg total) by mouth 2 (two) times daily., Disp: 60 tablet, Rfl: 3 .  digoxin (LANOXIN) 0.125 MG tablet, Take 1 tablet (0.125 mg total) by mouth daily., Disp: 30 tablet, Rfl: 6 .   folic acid (FOLVITE) 1 MG tablet, Take 1 tablet (1 mg total) by mouth daily., Disp: 90 tablet, Rfl: 3 .  furosemide (LASIX) 40 MG tablet, Take 80 mg by mouth 2 (two) times daily., Disp: , Rfl:  .  hydrALAZINE (APRESOLINE) 25 MG tablet, Take 1.5 tablets (37.5 mg total) by mouth 3 (three) times daily., Disp: 405 tablet, Rfl: 3 .  isosorbide mononitrate (IMDUR) 30 MG 24 hr tablet, Take 30 mg by mouth daily., Disp: , Rfl:  .  metolazone (ZAROXOLYN) 2.5 MG tablet, Take 1 tablet (2.5 mg total) by mouth 3 (three) times a week. Tuesday, Thursday, Saturday, Disp: 15 tablet, Rfl: 2 .  mometasone-formoterol (DULERA) 200-5 MCG/ACT AERO, Inhale 2 puffs into the lungs 2 (two) times daily., Disp: 8.8 g, Rfl: 3 .  Multiple Vitamin (MULTIVITAMIN) tablet, Take 1 tablet by mouth daily., Disp: , Rfl:  .  nicotine (NICOTROL) 10 MG inhaler, Inhale 1 Cartridge (1 continuous puffing total) into the lungs as needed for smoking cessation. Use at least 6 cartridges daily x 3-4 weeks, Disp: 504 each, Rfl: 2 .  potassium chloride SA (K-DUR,KLOR-CON) 20 MEQ tablet, Take 40 meq (2 tabs) in the AM and 20 meq one tab in the PM.Take an add'l 20 mEq (1 tablet) when take metolazone, Disp: 72 tablet, Rfl: 0 .  rivaroxaban (XARELTO) 20 MG TABS tablet, Take 1 tablet (20 mg total) by mouth daily with supper., Disp: 90 tablet, Rfl: 3 .  spironolactone (ALDACTONE) 25 MG tablet, Take 1 tablet (25 mg total) by  mouth daily., Disp: 30 tablet, Rfl: 6 .  venlafaxine XR (EFFEXOR-XR) 75 MG 24 hr capsule, Take 75 mg by mouth every other day. , Disp: , Rfl:  Allergies  Allergen Reactions  . Bee Venom Anaphylaxis  . Honey Anaphylaxis    RAW HONEY  . Shellfish Allergy Shortness Of Breath and Swelling  . Ibuprofen Hives and Swelling  . Betadine [Povidone Iodine]     BECAUSE OF SHELLFISH ALLERGY     Social History   Social History  . Marital status: Single    Spouse name: N/A  . Number of children: 1  . Years of education: N/A    Occupational History  . Disabled- Architect   .  Other   Social History Main Topics  . Smoking status: Former Smoker    Packs/day: 0.10    Years: 21.00    Types: Cigarettes    Quit date: 07/31/2016  . Smokeless tobacco: Never Used     Comment: a pack a week  . Alcohol use 0.0 oz/week     Comment: everyday  . Drug use: Yes     Comment: Marijuana.  . Sexual activity: Not on file   Other Topics Concern  . Not on file   Social History Narrative   Lives in [place] with his dog.  Has a 64 year old daughter that he sees daily.  Has good relationship with daughter's mom.  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     Physical Exam      SAFE - 09/30/16 1100      Situation   Admitting diagnosis chf   Heart failure history Exisiting   Readmitted within 30 days Yes   Hospital admission within past 12 months Yes   number of hospital admissions 5   number of ED visits 10     Assessment   Lives alone No   Primary support person Mom/step-dad/girlfriend   Mode of transportation personal car   Other services involved None   Home equipement Scale     Weight   Weighs self daily Yes   Scale provided No  has own scale   Records on weight chart No     Resources   Has "Living better w/heart failure" book Yes   Has HF Zone tool Yes   Able to identify yellow zone signs/when to call MD Yes   Records zone daily No     Medications   Uses a pill box Yes   Who stocks the pill box Self   Pill box checked this visit Yes   Pill box refilled this visit No   Difficulty obtaining medications No   Mail order medications Yes   New medications at home today No   Next scheduled delivery of medications 10/07/16   Missed one or more doses of medications per week Yes   How many missed doses this week 2  Xarelto     Nutrition   Patient receives meals on wheels No   Patient follows low sodium diet No   Has foods at home that meet the current recommended  diet Yes   Patient follows low sugar/card diet Yes   Nutritional concerns/issues n     Activity Level   ADL's/Mobility Independent   How many feet can patient ambulate >68ft    Typical activity level Active   Barriers None   Actvity tolerance: NYHA Class 2     Urine   Difficulty urinating No  Changes in urine None     Time spent with patient   Time spent with patient  45 Minutes        Future Appointments Date Time Provider [place]  11/17/2016 9:00 AM [facility]  11/17/2016 10:20 AM Jolaine Artist, MD [facility] None   BP (!) 132/102   Pulse (!) 108   Resp 20   Wt (!) 379 lb 3.2 oz (172 kg)   SpO2 98%   BMI 47.40 kg/m  Weight yesterday-374 Last visit weight-362  Pts weight is increased quite a bit, he has picked up drinking more heavily-encouraged him to go to rehab to get through his detox symptoms. He states he has too much going right now and he cant put it down. He has 2 dogs that are his babies that he cant leave and his g/f works til 88 and his mom works til late.  He missed Saturday/Tuesday doses of metolazone-he reports his breathing is on and off, a lot of coughing-sob upon simple household tasks. He stated he was going to get up there on Monday but it snowed so he couldn't get up there. Reminded him that I was here to help and let me know next time so he doesn't go without his meds. He is no longer on the effexor. His b/p is elevated. He has swelling to his legs-will take the metolazone today and then Saturday.  Reminded him to watch his fluid intake.  Pt denies any dizziness, no h/a.   ACTION: Home visit completed Next visit planned for next week   Marylouise Stacks, EMT-Paramedic 11/01/16

## 2016-10-29 ENCOUNTER — Telehealth (HOSPITAL_COMMUNITY): Payer: Self-pay | Admitting: *Deleted

## 2016-10-29 NOTE — Telephone Encounter (Signed)
Katie called and left message on triage line late yesterday afternoon reporting patient's weight is up 10 lbs since last week.  Increased SOB and swelling in legs.  He missed a couple doses of his metolazone earlier in the week.  Joellen Jersey was going to have him take his scheduled dose of metolazone yesterday and have him take an additional dose today.    I called Katie back to verify the plan and I had to leave a message for her to call us back.

## 2016-11-05 ENCOUNTER — Other Ambulatory Visit (HOSPITAL_COMMUNITY): Payer: Self-pay

## 2016-11-05 NOTE — Progress Notes (Signed)
Paramedicine Encounter    Patient ID: Donald Brady, male    DOB: 1975-10-17, 41 y.o.   MRN: [id number]   Patient Care Team: Florinda Marker, MD as PCP - General  Patient Active Problem List   Diagnosis Date Noted  . Back pain   . Morbid obesity due to excess calories ([place])   . Presbyopia 01/26/2016  . Alcohol abuse 01/26/2016  . Hypertensive heart disease 10/01/2015  . Chronic systolic CHF (congestive heart failure) ([place]) 10/01/2015  . Umbilical hernia without obstruction and without gangrene 08/29/2015  . Pre-diabetes 08/29/2015  . CKD (chronic kidney disease), stage III 08/19/2015  . Nonobstructive cardiomyopathy ([place]) 05/05/2015  . Cocaine use 12/23/2014  . Depression 10/10/2014  . GERD (gastroesophageal reflux disease) 07/24/2014  . Polycythemia, secondary 05/23/2014  . Factor 5 Leiden mutation, heterozygous ([place]) 05/22/2014  . Hyperlipidemia 04/23/2014  . Sinusitis, chronic 04/23/2014  . Severe obstructive sleep apnea 11/20/2013  . Essential hypertension, benign 08/04/2013  . Asthma 02/20/2013  . Osteoarthritis of left knee 06/28/2012  . Long term current use of anticoagulant therapy 12/02/2011  . Bilateral pulmonary embolism ([place]) 11/27/2011  . Tobacco abuse 11/27/2011    Current Outpatient Prescriptions:  .  albuterol (PROVENTIL HFA;VENTOLIN HFA) 108 (90 Base) MCG/ACT inhaler, Inhale 2 puffs into the lungs every 6 (six) hours as needed for wheezing or shortness of breath., Disp: 6.7 g, Rfl: 3 .  atorvastatin (LIPITOR) 40 MG tablet, Take 1 tablet (40 mg total) by mouth daily., Disp: 90 tablet, Rfl: 3 .  buPROPion (WELLBUTRIN XL) 150 MG 24 hr tablet, Take 1 tablet (150 mg total) by mouth daily. IM program, [facility], Disp: 30 tablet, Rfl: 1 .  carvedilol (COREG) 6.25 MG tablet, Take 1 tablet (6.25 mg total) by mouth 2 (two) times daily., Disp: 60 tablet, Rfl: 3 .  digoxin (LANOXIN) 0.125 MG tablet, Take 1 tablet (0.125 mg total) by mouth daily., Disp: 30 tablet, Rfl: 6 .   folic acid (FOLVITE) 1 MG tablet, Take 1 tablet (1 mg total) by mouth daily., Disp: 90 tablet, Rfl: 3 .  furosemide (LASIX) 40 MG tablet, Take 80 mg by mouth 2 (two) times daily., Disp: , Rfl:  .  hydrALAZINE (APRESOLINE) 25 MG tablet, Take 1.5 tablets (37.5 mg total) by mouth 3 (three) times daily., Disp: 405 tablet, Rfl: 3 .  isosorbide mononitrate (IMDUR) 30 MG 24 hr tablet, Take 30 mg by mouth daily., Disp: , Rfl:  .  metolazone (ZAROXOLYN) 2.5 MG tablet, Take 1 tablet (2.5 mg total) by mouth 3 (three) times a week. Tuesday, Thursday, Saturday, Disp: 15 tablet, Rfl: 2 .  mometasone-formoterol (DULERA) 200-5 MCG/ACT AERO, Inhale 2 puffs into the lungs 2 (two) times daily., Disp: 8.8 g, Rfl: 3 .  Multiple Vitamin (MULTIVITAMIN) tablet, Take 1 tablet by mouth daily., Disp: , Rfl:  .  nicotine (NICOTROL) 10 MG inhaler, Inhale 1 Cartridge (1 continuous puffing total) into the lungs as needed for smoking cessation. Use at least 6 cartridges daily x 3-4 weeks, Disp: 504 each, Rfl: 2 .  potassium chloride SA (K-DUR,KLOR-CON) 20 MEQ tablet, Take 40 meq (2 tabs) in the AM and 20 meq one tab in the PM.Take an add'l 20 mEq (1 tablet) when take metolazone, Disp: 72 tablet, Rfl: 0 .  rivaroxaban (XARELTO) 20 MG TABS tablet, Take 1 tablet (20 mg total) by mouth daily with supper., Disp: 90 tablet, Rfl: 3 .  spironolactone (ALDACTONE) 25 MG tablet, Take 1 tablet (25 mg total) by  mouth daily., Disp: 30 tablet, Rfl: 6 .  venlafaxine XR (EFFEXOR-XR) 75 MG 24 hr capsule, Take 75 mg by mouth every other day. , Disp: , Rfl:  Allergies  Allergen Reactions  . Bee Venom Anaphylaxis  . Honey Anaphylaxis    RAW HONEY  . Shellfish Allergy Shortness Of Breath and Swelling  . Ibuprofen Hives and Swelling  . Betadine [Povidone Iodine]     BECAUSE OF SHELLFISH ALLERGY     Social History   Social History  . Marital status: Single    Spouse name: N/A  . Number of children: 1  . Years of education: N/A    Occupational History  . Disabled- Architect   .  Other   Social History Main Topics  . Smoking status: Former Smoker    Packs/day: 0.10    Years: 21.00    Types: Cigarettes    Quit date: 07/31/2016  . Smokeless tobacco: Never Used     Comment: a pack a week  . Alcohol use 0.0 oz/week     Comment: everyday  . Drug use: Yes     Comment: Marijuana.  . Sexual activity: Not on file   Other Topics Concern  . Not on file   Social History Narrative   Lives in [place] with his dog.  Has a 51 year old daughter that he sees daily.  Has good relationship with daughter's mom.  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     Physical Exam  Constitutional: He is oriented to person, place, and time.  Neck: Normal range of motion.  Cardiovascular: Regular rhythm.   Pulmonary/Chest: Effort normal and breath sounds normal. No respiratory distress. He has no wheezes. He has no rales.  Abdominal: Soft.  Musculoskeletal: Normal range of motion. He exhibits no edema.  Neurological: He is alert and oriented to person, place, and time.        SAFE - 09/30/16 1100      Situation   Admitting diagnosis chf   Heart failure history Exisiting   Readmitted within 30 days Yes   Hospital admission within past 12 months Yes   number of hospital admissions 5   number of ED visits 10     Assessment   Lives alone No   Primary support person Mom/step-dad/girlfriend   Mode of transportation personal car   Other services involved None   Home equipement Scale     Weight   Weighs self daily Yes   Scale provided No  has own scale   Records on weight chart No     Resources   Has "Living better w/heart failure" book Yes   Has HF Zone tool Yes   Able to identify yellow zone signs/when to call MD Yes   Records zone daily No     Medications   Uses a pill box Yes   Who stocks the pill box Self   Pill box checked this visit Yes   Pill box refilled this visit No    Difficulty obtaining medications No   Mail order medications Yes   New medications at home today No   Next scheduled delivery of medications 10/07/16   Missed one or more doses of medications per week Yes   How many missed doses this week 2  Xarelto     Nutrition   Patient receives meals on wheels No   Patient follows low sodium diet No   Has foods at home  that meet the current recommended diet Yes   Patient follows low sugar/card diet Yes   Nutritional concerns/issues n     Activity Level   ADL's/Mobility Independent   How many feet can patient ambulate >15ft    Typical activity level Active   Barriers None   Actvity tolerance: NYHA Class 2     Urine   Difficulty urinating No   Changes in urine None     Time spent with patient   Time spent with patient  45 Minutes        Future Appointments Date Time Provider [place]  11/17/2016 9:00 AM MC ECHO 1-BUZZ [facility]  11/17/2016 10:20 AM Jolaine Artist, MD [facility] None   BP 114/90 (BP Location: Left Arm, Patient Position: Sitting, Cuff Size: Large)   Pulse (!) 101   Resp 16   Wt (!) 369 lb (167.4 kg)   SpO2 97%   BMI 46.12 kg/m  Weight yesterday-367 lb Last visit weight-379 lb CBG- 179   Mr Coye is seen at home today and reports feeling unwell. He says he has been drinking a significant amount of alcohol again (reports finishing a beer just prior to my arrival) and feels depressed. He believes this is because he is no longer taking Effexor because when he was on both Effexor and bupropion he was much happier and his girlfriend affirmed the same. I have contacted Mannie Stabile at internal medicine, who he says is managing these medications and am awaiting a return call. Today his girlfriend was making "sloppy joes" for lunch. When I mentioned the sodium content and how it was unhealthy for him he advised her was eating the "low sodium kind."  No edema, orthopnea, SOB, dizziness of headache were reported. His  pillbox was filled prior to me arriving, but medications were verified. I would like to talk with Kennyth Lose (LCSW) about Mr. Guice's alcohol abuse and treatment options.  Jacquiline Doe, EMT 11/05/16  ACTION: Home visit completed Next visit planned for 1 week

## 2016-11-08 ENCOUNTER — Other Ambulatory Visit: Payer: Self-pay | Admitting: Pharmacist

## 2016-11-08 ENCOUNTER — Telehealth: Payer: Self-pay | Admitting: Licensed Clinical Social Worker

## 2016-11-08 DIAGNOSIS — F329 Major depressive disorder, single episode, unspecified: Secondary | ICD-10-CM

## 2016-11-08 DIAGNOSIS — F32A Depression, unspecified: Secondary | ICD-10-CM

## 2016-11-08 MED ORDER — VENLAFAXINE HCL ER 37.5 MG PO CP24: 37.5000 mg | ORAL_CAPSULE | Freq: Every day | ORAL | 0 refills | Status: AC

## 2016-11-08 MED FILL — VENLAFAXINE HCL ER 37.5 MG: 37.5 | 30 days supply | Qty: 30 | Fill #0

## 2016-11-08 MED FILL — DIGITEK 125 MCG TABLET: 125 | 30 days supply | Qty: 30 | Fill #2

## 2016-11-08 MED FILL — SPIRONOLACTONE 25 MG TABLET: 25 | 30 days supply | Qty: 30 | Fill #2

## 2016-11-08 NOTE — Telephone Encounter (Signed)
CSW received call from [facility] paramedic stating he spoke with patient this morning who shared that he stopped drinking on Friday after an increase in his drinking since last HF clinic appointment. Patient drank 24 cans of beer a day and reported reducing to 2 cans earlier this year. Patient admitted to [facility] paramedic that he increased daily consumption to 12 cans a day recently due to feelings of depression. Patient shared he had chills and was vomiting yesterday and called this morning asking paramedic for alcohol support services. CSW provided options for alcohol resources through [facility] [place] and [facility] in [place]. CSW also encouraged  Paramedic to encourage patient to seek medical care if still having the chills and vomiting prior to seeking alcohol rehab services. Paramedic will follow up with patient and provide resources as paramedic feels he has a good relationship with patient. CSW advised to return call if further intervention needed by CSW. Paramedic verbalizes understanding and will return call if needed. Raquel Sarna, [place], [place]

## 2016-11-08 NOTE — Progress Notes (Signed)
Patient was tapered off the venlafaxine due to the plan to switch to bupropion. However, patient states he felt better taking both venlafaxine with bupropion. Venlafaxine can be dosed low/slow due to potential for bupropion to increase the venlafaxine effects. Will work with PCP.

## 2016-11-10 ENCOUNTER — Telehealth (HOSPITAL_COMMUNITY): Payer: Self-pay | Admitting: *Deleted

## 2016-11-10 ENCOUNTER — Other Ambulatory Visit (HOSPITAL_COMMUNITY): Payer: Self-pay

## 2016-11-10 DIAGNOSIS — I5022 Chronic systolic (congestive) heart failure: Secondary | ICD-10-CM

## 2016-11-10 NOTE — Progress Notes (Deleted)
Paramedicine Encounter    Patient ID: Donald Brady, male    DOB: 1976/01/26, 41 y.o.   MRN: [id number]   Patient Care Team: Florinda Marker, MD as PCP - General  Patient Active Problem List   Diagnosis Date Noted  . Back pain   . Morbid obesity due to excess calories ([place])   . Presbyopia 01/26/2016  . Alcohol abuse 01/26/2016  . Hypertensive heart disease 10/01/2015  . Chronic systolic CHF (congestive heart failure) ([place]) 10/01/2015  . Umbilical hernia without obstruction and without gangrene 08/29/2015  . Pre-diabetes 08/29/2015  . CKD (chronic kidney disease), stage III 08/19/2015  . Nonobstructive cardiomyopathy ([place]) 05/05/2015  . Cocaine use 12/23/2014  . Depression 10/10/2014  . GERD (gastroesophageal reflux disease) 07/24/2014  . Polycythemia, secondary 05/23/2014  . Factor 5 Leiden mutation, heterozygous ([place]) 05/22/2014  . Hyperlipidemia 04/23/2014  . Sinusitis, chronic 04/23/2014  . Severe obstructive sleep apnea 11/20/2013  . Essential hypertension, benign 08/04/2013  . Asthma 02/20/2013  . Osteoarthritis of left knee 06/28/2012  . Long term current use of anticoagulant therapy 12/02/2011  . Bilateral pulmonary embolism ([place]) 11/27/2011  . Tobacco abuse 11/27/2011    Current Outpatient Prescriptions:  .  albuterol (PROVENTIL HFA;VENTOLIN HFA) 108 (90 Base) MCG/ACT inhaler, Inhale 2 puffs into the lungs every 6 (six) hours as needed for wheezing or shortness of breath., Disp: 6.7 g, Rfl: 3 .  atorvastatin (LIPITOR) 40 MG tablet, Take 1 tablet (40 mg total) by mouth daily., Disp: 90 tablet, Rfl: 3 .  buPROPion (WELLBUTRIN XL) 150 MG 24 hr tablet, Take 1 tablet (150 mg total) by mouth daily. IM program, [facility], Disp: 30 tablet, Rfl: 1 .  carvedilol (COREG) 6.25 MG tablet, Take 1 tablet (6.25 mg total) by mouth 2 (two) times daily., Disp: 60 tablet, Rfl: 3 .  digoxin (LANOXIN) 0.125 MG tablet, Take 1 tablet (0.125 mg total) by mouth daily., Disp: 30 tablet, Rfl: 6 .   folic acid (FOLVITE) 1 MG tablet, Take 1 tablet (1 mg total) by mouth daily., Disp: 90 tablet, Rfl: 3 .  furosemide (LASIX) 40 MG tablet, Take 80 mg by mouth 2 (two) times daily., Disp: , Rfl:  .  hydrALAZINE (APRESOLINE) 25 MG tablet, Take 1.5 tablets (37.5 mg total) by mouth 3 (three) times daily., Disp: 405 tablet, Rfl: 3 .  isosorbide mononitrate (IMDUR) 30 MG 24 hr tablet, Take 30 mg by mouth daily., Disp: , Rfl:  .  metolazone (ZAROXOLYN) 2.5 MG tablet, Take 1 tablet (2.5 mg total) by mouth 3 (three) times a week. Tuesday, Thursday, Saturday, Disp: 15 tablet, Rfl: 2 .  mometasone-formoterol (DULERA) 200-5 MCG/ACT AERO, Inhale 2 puffs into the lungs 2 (two) times daily., Disp: 8.8 g, Rfl: 3 .  Multiple Vitamin (MULTIVITAMIN) tablet, Take 1 tablet by mouth daily., Disp: , Rfl:  .  nicotine (NICOTROL) 10 MG inhaler, Inhale 1 Cartridge (1 continuous puffing total) into the lungs as needed for smoking cessation. Use at least 6 cartridges daily x 3-4 weeks, Disp: 504 each, Rfl: 2 .  potassium chloride SA (K-DUR,KLOR-CON) 20 MEQ tablet, Take 40 meq (2 tabs) in the AM and 20 meq one tab in the PM.Take an add'l 20 mEq (1 tablet) when take metolazone, Disp: 72 tablet, Rfl: 0 .  rivaroxaban (XARELTO) 20 MG TABS tablet, Take 1 tablet (20 mg total) by mouth daily with supper., Disp: 90 tablet, Rfl: 3 .  spironolactone (ALDACTONE) 25 MG tablet, Take 1 tablet (25 mg total) by  mouth daily., Disp: 30 tablet, Rfl: 6 .  venlafaxine XR (EFFEXOR XR) 37.5 MG 24 hr capsule, Take 1 capsule (37.5 mg total) by mouth daily with breakfast. IM program, [facility], Disp: 30 capsule, Rfl: 0 Allergies  Allergen Reactions  . Bee Venom Anaphylaxis  . Honey Anaphylaxis    RAW HONEY  . Shellfish Allergy Shortness Of Breath and Swelling  . Ibuprofen Hives and Swelling  . Betadine [Povidone Iodine]     BECAUSE OF SHELLFISH ALLERGY     Social History   Social History  . Marital status: Single    Spouse name: N/A  .  Number of children: 1  . Years of education: N/A   Occupational History  . Disabled- Architect   .  Other   Social History Main Topics  . Smoking status: Former Smoker    Packs/day: 0.10    Years: 21.00    Types: Cigarettes    Quit date: 07/31/2016  . Smokeless tobacco: Never Used     Comment: a pack a week  . Alcohol use 0.0 oz/week     Comment: everyday  . Drug use: Yes     Comment: Marijuana.  . Sexual activity: Not on file   Other Topics Concern  . Not on file   Social History Narrative   Lives in [place] with his dog.  Has a 17 year old daughter that he sees daily.  Has good relationship with daughter's mom.  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     Physical Exam      SAFE - 09/30/16 1100      Situation   Admitting diagnosis chf   Heart failure history Exisiting   Readmitted within 30 days Yes   Hospital admission within past 12 months Yes   number of hospital admissions 5   number of ED visits 10     Assessment   Lives alone No   Primary support person Mom/step-dad/girlfriend   Mode of transportation personal car   Other services involved None   Home equipement Scale     Weight   Weighs self daily Yes   Scale provided No  has own scale   Records on weight chart No     Resources   Has "Living better w/heart failure" book Yes   Has HF Zone tool Yes   Able to identify yellow zone signs/when to call MD Yes   Records zone daily No     Medications   Uses a pill box Yes   Who stocks the pill box Self   Pill box checked this visit Yes   Pill box refilled this visit No   Difficulty obtaining medications No   Mail order medications Yes   New medications at home today No   Next scheduled delivery of medications 10/07/16   Missed one or more doses of medications per week Yes   How many missed doses this week 2  Xarelto     Nutrition   Patient receives meals on wheels No   Patient follows low sodium diet No   Has  foods at home that meet the current recommended diet Yes   Patient follows low sugar/card diet Yes   Nutritional concerns/issues n     Activity Level   ADL's/Mobility Independent   How many feet can patient ambulate >57ft    Typical activity level Active   Barriers None   Actvity tolerance: NYHA Class 2  Urine   Difficulty urinating No   Changes in urine None     Time spent with patient   Time spent with patient  45 Minutes        Future Appointments Date Time Provider [place]  11/17/2016 9:00 AM [facility] E[facility] 1-BUZZ [facility]  11/17/2016 10:20 AM Jolaine Artist, MD [facility] None   BP 118/90   Pulse (!) 102   Resp 16   Wt (!) 358 lb 3.2 oz (162.5 kg)   SpO2 99%   BMI 44.77 kg/m  Weight yesterday-361.5 Last visit weight-369  Pt states he is doing ok. He was asking what the next step in getting his new cpap equipment so I left a message with the heart clinic. I dropped off his meds the other day and he was struggling with w/d symptoms but  he did end up drinking alcohol on Monday again and hasnt had anything to drink since Monday. His PCP started him back on effexor the other day and he feels like that helps him stay off the alcohol. He sleeps with head on couch, he states his breathing is doing ok. He feels his heart racing at times. He states he isnt able to eat meat, it gets him sick and he vomits. He states he is cramping to his lower flank area.  meds verified-he does his own pill box. He states he always has pains-joint pains with some gout flare ups occasionally. Pt states he can get through the w/d symptoms being on the effexor, encouraged him to continue to stop the alcohol. Gave him info for  another source for counseling services for [facility].    ACTION: Home visit completed Next visit planned for next week   Marylouise Stacks, EMT-Paramedic 11/10/16

## 2016-11-10 NOTE — Progress Notes (Signed)
Paramedicine Encounter    Patient ID: Donald Brady, male    DOB: 11-13-1975, 41 y.o.   MRN: [id number]   Patient Care Team: Florinda Marker, MD as PCP - General  Patient Active Problem List   Diagnosis Date Noted  . Back pain   . Morbid obesity due to excess calories ([place])   . Presbyopia 01/26/2016  . Alcohol abuse 01/26/2016  . Hypertensive heart disease 10/01/2015  . Chronic systolic CHF (congestive heart failure) ([place]) 10/01/2015  . Umbilical hernia without obstruction and without gangrene 08/29/2015  . Pre-diabetes 08/29/2015  . CKD (chronic kidney disease), stage III 08/19/2015  . Nonobstructive cardiomyopathy ([place]) 05/05/2015  . Cocaine use 12/23/2014  . Depression 10/10/2014  . GERD (gastroesophageal reflux disease) 07/24/2014  . Polycythemia, secondary 05/23/2014  . Factor 5 Leiden mutation, heterozygous ([place]) 05/22/2014  . Hyperlipidemia 04/23/2014  . Sinusitis, chronic 04/23/2014  . Severe obstructive sleep apnea 11/20/2013  . Essential hypertension, benign 08/04/2013  . Asthma 02/20/2013  . Osteoarthritis of left knee 06/28/2012  . Long term current use of anticoagulant therapy 12/02/2011  . Bilateral pulmonary embolism ([place]) 11/27/2011  . Tobacco abuse 11/27/2011    Current Outpatient Prescriptions:  .  albuterol (PROVENTIL HFA;VENTOLIN HFA) 108 (90 Base) MCG/ACT inhaler, Inhale 2 puffs into the lungs every 6 (six) hours as needed for wheezing or shortness of breath., Disp: 6.7 g, Rfl: 3 .  atorvastatin (LIPITOR) 40 MG tablet, Take 1 tablet (40 mg total) by mouth daily., Disp: 90 tablet, Rfl: 3 .  buPROPion (WELLBUTRIN XL) 150 MG 24 hr tablet, Take 1 tablet (150 mg total) by mouth daily. IM program, [facility], Disp: 30 tablet, Rfl: 1 .  carvedilol (COREG) 6.25 MG tablet, Take 1 tablet (6.25 mg total) by mouth 2 (two) times daily., Disp: 60 tablet, Rfl: 3 .  digoxin (LANOXIN) 0.125 MG tablet, Take 1 tablet (0.125 mg total) by mouth daily., Disp: 30 tablet, Rfl: 6 .   folic acid (FOLVITE) 1 MG tablet, Take 1 tablet (1 mg total) by mouth daily., Disp: 90 tablet, Rfl: 3 .  furosemide (LASIX) 40 MG tablet, Take 80 mg by mouth 2 (two) times daily., Disp: , Rfl:  .  hydrALAZINE (APRESOLINE) 25 MG tablet, Take 1.5 tablets (37.5 mg total) by mouth 3 (three) times daily., Disp: 405 tablet, Rfl: 3 .  isosorbide mononitrate (IMDUR) 30 MG 24 hr tablet, Take 30 mg by mouth daily., Disp: , Rfl:  .  metolazone (ZAROXOLYN) 2.5 MG tablet, Take 1 tablet (2.5 mg total) by mouth 3 (three) times a week. Tuesday, Thursday, Saturday, Disp: 15 tablet, Rfl: 2 .  mometasone-formoterol (DULERA) 200-5 MCG/ACT AERO, Inhale 2 puffs into the lungs 2 (two) times daily., Disp: 8.8 g, Rfl: 3 .  Multiple Vitamin (MULTIVITAMIN) tablet, Take 1 tablet by mouth daily., Disp: , Rfl:  .  nicotine (NICOTROL) 10 MG inhaler, Inhale 1 Cartridge (1 continuous puffing total) into the lungs as needed for smoking cessation. Use at least 6 cartridges daily x 3-4 weeks, Disp: 504 each, Rfl: 2 .  potassium chloride SA (K-DUR,KLOR-CON) 20 MEQ tablet, Take 40 meq (2 tabs) in the AM and 20 meq one tab in the PM.Take an add'l 20 mEq (1 tablet) when take metolazone, Disp: 72 tablet, Rfl: 0 .  rivaroxaban (XARELTO) 20 MG TABS tablet, Take 1 tablet (20 mg total) by mouth daily with supper., Disp: 90 tablet, Rfl: 3 .  spironolactone (ALDACTONE) 25 MG tablet, Take 1 tablet (25 mg total) by  mouth daily., Disp: 30 tablet, Rfl: 6 .  venlafaxine XR (EFFEXOR XR) 37.5 MG 24 hr capsule, Take 1 capsule (37.5 mg total) by mouth daily with breakfast. IM program, [facility], Disp: 30 capsule, Rfl: 0 Allergies  Allergen Reactions  . Bee Venom Anaphylaxis  . Honey Anaphylaxis    RAW HONEY  . Shellfish Allergy Shortness Of Breath and Swelling  . Ibuprofen Hives and Swelling  . Betadine [Povidone Iodine]     BECAUSE OF SHELLFISH ALLERGY     Social History   Social History  . Marital status: Single    Spouse name: N/A  .  Number of children: 1  . Years of education: N/A   Occupational History  . Disabled- Architect   .  Other   Social History Main Topics  . Smoking status: Former Smoker    Packs/day: 0.10    Years: 21.00    Types: Cigarettes    Quit date: 07/31/2016  . Smokeless tobacco: Never Used     Comment: a pack a week  . Alcohol use 0.0 oz/week     Comment: everyday  . Drug use: Yes     Comment: Marijuana.  . Sexual activity: Not on file   Other Topics Concern  . Not on file   Social History Narrative   Lives in [place] with his dog.  Has a 73 year old daughter that he sees daily.  Has good relationship with daughter's mom.  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     Physical Exam      SAFE - 09/30/16 1100      Situation   Admitting diagnosis chf   Heart failure history Exisiting   Readmitted within 30 days Yes   Hospital admission within past 12 months Yes   number of hospital admissions 5   number of ED visits 10     Assessment   Lives alone No   Primary support person Mom/step-dad/girlfriend   Mode of transportation personal car   Other services involved None   Home equipement Scale     Weight   Weighs self daily Yes   Scale provided No  has own scale   Records on weight chart No     Resources   Has "Living better w/heart failure" book Yes   Has HF Zone tool Yes   Able to identify yellow zone signs/when to call MD Yes   Records zone daily No     Medications   Uses a pill box Yes   Who stocks the pill box Self   Pill box checked this visit Yes   Pill box refilled this visit No   Difficulty obtaining medications No   Mail order medications Yes   New medications at home today No   Next scheduled delivery of medications 10/07/16   Missed one or more doses of medications per week Yes   How many missed doses this week 2  Xarelto     Nutrition   Patient receives meals on wheels No   Patient follows low sodium diet No   Has  foods at home that meet the current recommended diet Yes   Patient follows low sugar/card diet Yes   Nutritional concerns/issues n     Activity Level   ADL's/Mobility Independent   How many feet can patient ambulate >87ft    Typical activity level Active   Barriers None   Actvity tolerance: NYHA Class 2  Urine   Difficulty urinating No   Changes in urine None     Time spent with patient   Time spent with patient  45 Minutes        Future Appointments Date Time Provider [place]  11/17/2016 9:00 AM [facility] E[facility] 1-BUZZ [facility]  11/17/2016 10:20 AM Jolaine Artist, MD [facility] None   BP 118/90   Pulse (!) 102   Resp 16   Wt (!) 358 lb 3.2 oz (162.5 kg)   SpO2 99%   BMI 44.77 kg/m  Weight yesterday-361.5 Last visit weight-369  Pt states he is doing ok. He was asking what the next step in getting his new cpap equipment so I left a message with the heart clinic. I dropped off his meds the other day and he was struggling with w/d symptoms but  he did end up drinking alcohol on Monday again and hasnt had anything to drink since Monday. His PCP started him back on effexor the other day and he feels like that helps him stay off the alcohol. He sleeps with head on couch, he states his breathing is doing ok. He feels his heart racing at times. He states he isnt able to eat meat, it gets him sick and he vomits. He states he is cramping to his lower flank area.  meds verified-he does his own pill box. He states he always has pains-joint pains with some gout flare ups occasionally. Pt states he can get through the w/d symptoms being on the effexor, encouraged him to continue to stop the alcohol. Gave him info for  another source for counseling services for [facility].    ACTION: Home visit completed Next visit planned for next week   Marylouise Stacks, EMT-Paramedic 11/10/16

## 2016-11-10 NOTE — Telephone Encounter (Signed)
Donald Brady with paramed called and reported that patient had not received his CPAP equipment yet.  Spoke with Donald Rosebush, RN and she advised me to place ambulatory referral to [facility] so that Donald Brady can read sleep study that was done on patient on 10/08/2016 and they would be the one to order CPAP equipment.  Referral placed and Donald Brady will let patient know that referral was placed and have Brady call us back if he doesn't hear anything soon.

## 2016-11-13 NOTE — Progress Notes (Signed)
Cardiology Office Note   Date:  11/15/2016   ID:  Donald Brady, DOB [birth year], MRN [id number]  PCP:  Donald Marker, MD  Cardiologist:   Donald Martinique, MD   Chief Complaint  Patient presents with  . Follow-up    Pt states no Sx.   . Congestive Heart Failure      History of Present Illness: Donald Brady is a 41 y.o. male is seen for follow up CHF secondary to nonischemic cardiomyopathy. He was last seen by me in February 2017.  He has a history of Factor V Leiden deficiency and prior pulmonary embolus. He is on chronic Xarelto. He has multiple cardiac risk factors including HTN, hypercholesterolemia, tobacco abuse, and family history of early CAD. He has a history of cocaine abuse. He was evaluated in July 2016 for an abnormal Ecg. Echo showed reduction in EF to 35-40% that was new since 2014. Cardiac cath showed no obstructive CAD. Medical therapy was recommended. He was started on ACEi, Hydrdalazine, and nitrates. Beta blocker held due to cocaine abuse.   Since February 2017 he was admitted in July 2017 with acute CHF. He had ongoing polysubstance abuse and noncompliance with medications. He left AMA. Seen in ED again with increased CHF since he ran out of lasix. He also had dental abscess then. Admitted in January with worsening CHF. Again had issues with polysubstance abuse including  Etoh, and THC. Seen by Advanced Heart failure team and medications adjusted. Last seen in CHF clinic on March 2. Last Echo in January 2017 showed reduced EF to 20%. He is scheduled for repeat Echo this week.   On follow up today he reports he had an excellent weekend. No dyspnea, increased edema, chest pain, dizziness, orthopnea, or PND. He is trying to eat heart healthy and limit his sodium intake. Eating more fish, [place] and boiled chicken. Reports he quit using cocaine over a year ago. Was drinking up to a case of beer per day but now drinking 2-3 beers every 2-3 days. Smoking one Pk/week now with  nicotrol patch. He is walking and riding his bike. He is followed by paramedicine and reports BP usually 120/90.   Past Medical History:  Diagnosis Date  . Anxiety   . Arthritis    left knee  . Asthma    no inhaler use in 1 year  . CHF (congestive heart failure) ([place])   . Factor V Leiden ([place])   . GERD (gastroesophageal reflux disease)   . History of pulmonary embolus ([place])    takes Xarelto  . History of seizures    no known cause, per pt. - has been "years" since last seizure; no longer on anticonvulsant  . HLD (hyperlipidemia)   . Hypertension    states BP "runs high"; has been on med. "a while"  . Lateral meniscus tear 01/2015   left knee  . Shortness of breath dyspnea   . Sleep apnea    had sleep study 10/2013:  "severe" sleep apnea, states could not afford CPAP machine    Past Surgical History:  Procedure Laterality Date  . CARDIAC CATHETERIZATION N/A 02/19/2015   Procedure: Left Heart Cath and Coronary Angiography;  Surgeon: Donald M Martinique, MD;  Location: [facility];  Service: Cardiovascular;  Laterality: N/A;  . CHONDROPLASTY Left 03/26/2015   Procedure: CHONDROPLASTY;  Surgeon: Leandrew Koyanagi, MD;  Location: [facility];  Service: Orthopedics;  Laterality: Left;  . ESOPHAGOGASTRODUODENOSCOPY (EGD) WITH  PROPOFOL  07/10/2014  . HAND SURGERY Right    states knuckle removed  . KNEE ARTHROSCOPY WITH LATERAL MENISECTOMY Left 03/26/2015   Procedure: LEFT KNEE ARTHROSCOPY WITH LATERAL MENISECTOMY  CHONDROPLASTY;  Surgeon: Leandrew Koyanagi, MD;  Location: [facility];  Service: Orthopedics;  Laterality: Left;  . ORIF FEMUR FRACTURE Left   . VIDEO BRONCHOSCOPY Bilateral 12/06/2012   Procedure: VIDEO BRONCHOSCOPY WITHOUT FLUORO;  Surgeon: Juanito Doom, MD;  Location: [facility];  Service: Cardiopulmonary;  Laterality: Bilateral;     Current Outpatient Prescriptions  Medication Sig Dispense Refill  . albuterol (PROVENTIL HFA;VENTOLIN HFA) 108 (90  Base) MCG/ACT inhaler Inhale 2 puffs into the lungs every 6 (six) hours as needed for wheezing or shortness of breath. 6.7 g 3  . atorvastatin (LIPITOR) 40 MG tablet Take 1 tablet (40 mg total) by mouth daily. 90 tablet 3  . buPROPion (WELLBUTRIN XL) 150 MG 24 hr tablet Take 1 tablet (150 mg total) by mouth daily. IM program, [facility] 30 tablet 1  . digoxin (LANOXIN) 0.125 MG tablet Take 1 tablet (0.125 mg total) by mouth daily. 30 tablet 6  . folic acid (FOLVITE) 1 MG tablet Take 1 tablet (1 mg total) by mouth daily. 90 tablet 3  . furosemide (LASIX) 40 MG tablet Take 80 mg by mouth 2 (two) times daily.    . hydrALAZINE (APRESOLINE) 25 MG tablet Take 1.5 tablets (37.5 mg total) by mouth 3 (three) times daily. 405 tablet 3  . isosorbide mononitrate (IMDUR) 30 MG 24 hr tablet Take 30 mg by mouth daily.    . metolazone (ZAROXOLYN) 2.5 MG tablet Take 1 tablet (2.5 mg total) by mouth 3 (three) times a week. Tuesday, Thursday, Saturday 15 tablet 2  . mometasone-formoterol (DULERA) 200-5 MCG/ACT AERO Inhale 2 puffs into the lungs 2 (two) times daily. 8.8 g 3  . Multiple Vitamin (MULTIVITAMIN) tablet Take 1 tablet by mouth daily.    . nicotine (NICOTROL) 10 MG inhaler Inhale 1 Cartridge (1 continuous puffing total) into the lungs as needed for smoking cessation. Use at least 6 cartridges daily x 3-4 weeks 504 each 2  . potassium chloride SA (K-DUR,KLOR-CON) 20 MEQ tablet Take 40 meq (2 tabs) in the AM and 20 meq one tab in the PM.Take an add'l 20 mEq (1 tablet) when take metolazone 72 tablet 0  . rivaroxaban (XARELTO) 20 MG TABS tablet Take 1 tablet (20 mg total) by mouth daily with supper. 90 tablet 3  . spironolactone (ALDACTONE) 25 MG tablet Take 1 tablet (25 mg total) by mouth daily. 30 tablet 6  . venlafaxine XR (EFFEXOR XR) 37.5 MG 24 hr capsule Take 1 capsule (37.5 mg total) by mouth daily with breakfast. IM program, [facility] 30 capsule 0  . carvedilol (COREG) 12.5 MG tablet Take 1 tablet (12.5 mg  total) by mouth 2 (two) times daily. 180 tablet 3   No current facility-administered medications for this visit.     Allergies:   Bee venom; Honey; Shellfish allergy; Ibuprofen; and Betadine [povidone iodine]    Social History:  The patient  reports that he quit smoking about 3 months ago. His smoking use included Cigarettes. He has a 2.10 pack-year smoking history. He has never used smokeless tobacco. He reports that he drinks alcohol. He reports that he uses drugs.   Family History:  The patient's family history includes Breast cancer in his maternal aunt; Cancer in his father; Clotting disorder in his father; Diabetes in  his maternal aunt, maternal uncle, and mother; Hearing loss in his father; Heart disease in his father; Hypertension in his maternal aunt, maternal uncle, and mother; Kidney disease in his maternal aunt; Liver disease in his maternal uncle; Lung cancer in his paternal uncle; Stomach cancer in his maternal uncle and maternal uncle.    ROS:  Please see the history of present illness.   Otherwise, review of systems are positive for none.   All other systems are reviewed and negative.    PHYSICAL EXAM: VS:  BP (!) 139/98   Pulse (!) 108   Ht 6' 2.5" (1.892 m)   Wt (!) 367 lb (166.5 kg)   BMI 46.49 kg/m  , BMI Body mass index is 46.49 kg/m. GEN: Well nourished, obese BM, in no acute distress  HEENT: normal  Neck: no JVD, carotid bruits, or masses Cardiac: RRR; no murmurs, rubs, or gallops,no edema  Respiratory:  clear to auscultation bilaterally, normal work of breathing GI: soft, nontender, nondistended, + BS MS: no deformity or atrophy  Skin: warm and dry, no rash Neuro:  Strength and sensation are intact Psych: euthymic mood, full affect   EKG:  EKG is not ordered today.    Recent Labs: 08/25/2016: Magnesium 1.9; TSH 1.206 08/30/2016: ALT 42 09/03/2016: Hemoglobin 16.7; Platelets 333 09/16/2016: B Natriuretic Peptide 145.1 10/15/2016: BUN 30; Creatinine, Ser  2.11; Potassium 3.5; Sodium 132    Lipid Panel    Component Value Date/Time   CHOL 177 08/18/2016 0342   TRIG 160 (H) 08/18/2016 0342   HDL 43 08/18/2016 0342   CHOLHDL 4.1 08/18/2016 0342   VLDL 32 08/18/2016 0342   LDLCALC 102 (H) 08/18/2016 0342      Wt Readings from Last 3 Encounters:  11/15/16 (!) 367 lb (166.5 kg)  11/05/16 (!) 369 lb (167.4 kg)  10/28/16 (!) 379 lb 3.2 oz (172 kg)      Other studies Reviewed: Echo 08/18/16: Study Conclusions  - Left ventricle: The cavity size was normal. Wall thickness was   increased in a pattern of mild LVH. The estimated ejection   fraction was 20%. Diffuse hypokinesis. Features are consistent   with a pseudonormal left ventricular filling pattern, with   concomitant abnormal relaxation and increased filling pressure   (grade 2 diastolic dysfunction). - Aortic valve: There was no stenosis. - Mitral valve: There was no significant regurgitation. - Left atrium: The atrium was moderately dilated. - Right ventricle: The cavity size was normal. Systolic function   was mildly reduced. - Pulmonary arteries: PA peak pressure: 33 mm Hg (S). - Systemic veins: IVC measured 3.4 cm with < 50% respirophasic   variation, suggesting RA pressure 15 mmHg.  Impressions:  - Normal LV size with mild LV hypertrophy. EF 20% with diffuse   hypokinesis. Moderate diastolic dysfunction. Normal RV size with   mildly reduced systolic function.    ASSESSMENT AND PLAN:  1.  Chronic systolic CHF secondary to Nonischemic cardiomyopathy. Last EF 20%. Most likely related to uncontrolled HTN and polysubstance abuse.   He is symptomatically class 2. Volume status looks good.  Counseled on need to stay off  Etoh.  Smoking cessation recommended. Repeat Echo pending this week. I will increase Coreg to 12.5 mg bid today. 2. Factor V Leiden deficiency. History of pulmonary embolus. On Xarelto. 3. HTN- fair control. Increase Coreg as noted. 4.  Tobacco/Etoh/Cocaine/THC abuse. Encouraged smoking and Etoh cessation. Quit Cocaine > one year ago. 5. Hypercholesterolemia. 6. Family history of early CAD.  7.  CKD stage 2 8. OSA on CPAP 9. Major depression   Current medicines are reviewed at length with the patient today.  The patient does not have concerns regarding medicines.  The following changes have been made:  Increase Coreg to 12.5 mg bid.  Labs/ tests ordered today include: none   Disposition:   Since patient is being actively followed in CHF clinic I will FU  6 months  Signed, Donald Brady, [place]  11/15/2016 8:46 AM    [facility] [address] [place], [place], [address] Phone [phone], Fax [phone]

## 2016-11-15 ENCOUNTER — Encounter: Payer: Self-pay | Admitting: Cardiology

## 2016-11-15 ENCOUNTER — Ambulatory Visit (INDEPENDENT_AMBULATORY_CARE_PROVIDER_SITE_OTHER): Payer: Self-pay | Admitting: Cardiology

## 2016-11-15 VITALS — BP 139/98 | HR 108 | Ht 74.5 in | Wt 367.0 lb

## 2016-11-15 DIAGNOSIS — Z720 Tobacco use: Secondary | ICD-10-CM

## 2016-11-15 DIAGNOSIS — I5022 Chronic systolic (congestive) heart failure: Secondary | ICD-10-CM

## 2016-11-15 DIAGNOSIS — F1010 Alcohol abuse, uncomplicated: Secondary | ICD-10-CM

## 2016-11-15 DIAGNOSIS — N183 Chronic kidney disease, stage 3 unspecified: Secondary | ICD-10-CM

## 2016-11-15 DIAGNOSIS — I10 Essential (primary) hypertension: Secondary | ICD-10-CM

## 2016-11-15 MED ORDER — CARVEDILOL 12.5 MG PO TABS: 12.5000 mg | ORAL_TABLET | Freq: Two times a day (BID) | ORAL | 3 refills | Status: AC

## 2016-11-15 NOTE — Patient Instructions (Addendum)
Increase Coreg to 12.5 mg twice a day.   Continue your other therapy  You need to quit drinking alcohol and smoking  We will follow up on your Echocardiogram this week.

## 2016-11-16 ENCOUNTER — Other Ambulatory Visit: Payer: Self-pay | Admitting: Internal Medicine

## 2016-11-16 DIAGNOSIS — J45909 Unspecified asthma, uncomplicated: Secondary | ICD-10-CM

## 2016-11-16 DIAGNOSIS — E7849 Other hyperlipidemia: Secondary | ICD-10-CM

## 2016-11-17 ENCOUNTER — Ambulatory Visit (HOSPITAL_BASED_OUTPATIENT_CLINIC_OR_DEPARTMENT_OTHER)
Admission: RE | Admit: 2016-11-17 | Discharge: 2016-11-17 | Disposition: A | Discharge status: Home / Self Care | Payer: Medicaid Other | Source: Ambulatory Visit | Attending: Internal Medicine | Admitting: Internal Medicine

## 2016-11-17 ENCOUNTER — Encounter (HOSPITAL_COMMUNITY): Payer: Self-pay | Admitting: Internal Medicine

## 2016-11-17 ENCOUNTER — Other Ambulatory Visit (HOSPITAL_COMMUNITY): Payer: Self-pay

## 2016-11-17 ENCOUNTER — Ambulatory Visit (HOSPITAL_COMMUNITY)
Admission: RE | Admit: 2016-11-17 | Discharge: 2016-11-17 | Disposition: A | Discharge status: Home / Self Care | Payer: Medicaid Other | Source: Ambulatory Visit | Attending: Internal Medicine | Admitting: Internal Medicine

## 2016-11-17 ENCOUNTER — Other Ambulatory Visit: Payer: Self-pay | Admitting: Internal Medicine

## 2016-11-17 VITALS — BP 162/100 | HR 104 | Wt 366.0 lb

## 2016-11-17 DIAGNOSIS — J45909 Unspecified asthma, uncomplicated: Secondary | ICD-10-CM | POA: Insufficient documentation

## 2016-11-17 DIAGNOSIS — G4733 Obstructive sleep apnea (adult) (pediatric): Secondary | ICD-10-CM | POA: Diagnosis not present

## 2016-11-17 DIAGNOSIS — I10 Essential (primary) hypertension: Secondary | ICD-10-CM

## 2016-11-17 DIAGNOSIS — E784 Other hyperlipidemia: Secondary | ICD-10-CM

## 2016-11-17 DIAGNOSIS — I5022 Chronic systolic (congestive) heart failure: Secondary | ICD-10-CM

## 2016-11-17 DIAGNOSIS — Z8249 Family history of ischemic heart disease and other diseases of the circulatory system: Secondary | ICD-10-CM | POA: Insufficient documentation

## 2016-11-17 DIAGNOSIS — Z8051 Family history of malignant neoplasm of kidney: Secondary | ICD-10-CM | POA: Insufficient documentation

## 2016-11-17 DIAGNOSIS — I5042 Chronic combined systolic (congestive) and diastolic (congestive) heart failure: Secondary | ICD-10-CM | POA: Insufficient documentation

## 2016-11-17 DIAGNOSIS — F1010 Alcohol abuse, uncomplicated: Secondary | ICD-10-CM | POA: Insufficient documentation

## 2016-11-17 DIAGNOSIS — Z86711 Personal history of pulmonary embolism: Secondary | ICD-10-CM | POA: Insufficient documentation

## 2016-11-17 DIAGNOSIS — Z800 Family history of malignant neoplasm of digestive organs: Secondary | ICD-10-CM | POA: Insufficient documentation

## 2016-11-17 DIAGNOSIS — Z720 Tobacco use: Secondary | ICD-10-CM

## 2016-11-17 DIAGNOSIS — Z803 Family history of malignant neoplasm of breast: Secondary | ICD-10-CM | POA: Insufficient documentation

## 2016-11-17 DIAGNOSIS — Z801 Family history of malignant neoplasm of trachea, bronchus and lung: Secondary | ICD-10-CM | POA: Insufficient documentation

## 2016-11-17 DIAGNOSIS — E7849 Other hyperlipidemia: Secondary | ICD-10-CM

## 2016-11-17 DIAGNOSIS — Z833 Family history of diabetes mellitus: Secondary | ICD-10-CM | POA: Insufficient documentation

## 2016-11-17 DIAGNOSIS — F329 Major depressive disorder, single episode, unspecified: Secondary | ICD-10-CM | POA: Insufficient documentation

## 2016-11-17 DIAGNOSIS — I2510 Atherosclerotic heart disease of native coronary artery without angina pectoris: Secondary | ICD-10-CM | POA: Insufficient documentation

## 2016-11-17 DIAGNOSIS — E785 Hyperlipidemia, unspecified: Secondary | ICD-10-CM | POA: Insufficient documentation

## 2016-11-17 DIAGNOSIS — Z79899 Other long term (current) drug therapy: Secondary | ICD-10-CM | POA: Insufficient documentation

## 2016-11-17 DIAGNOSIS — I130 Hypertensive heart and chronic kidney disease with heart failure and stage 1 through stage 4 chronic kidney disease, or unspecified chronic kidney disease: Secondary | ICD-10-CM | POA: Insufficient documentation

## 2016-11-17 DIAGNOSIS — F419 Anxiety disorder, unspecified: Secondary | ICD-10-CM | POA: Insufficient documentation

## 2016-11-17 DIAGNOSIS — Z832 Family history of diseases of the blood and blood-forming organs and certain disorders involving the immune mechanism: Secondary | ICD-10-CM | POA: Insufficient documentation

## 2016-11-17 DIAGNOSIS — K219 Gastro-esophageal reflux disease without esophagitis: Secondary | ICD-10-CM | POA: Insufficient documentation

## 2016-11-17 DIAGNOSIS — D6851 Activated protein C resistance: Secondary | ICD-10-CM | POA: Insufficient documentation

## 2016-11-17 DIAGNOSIS — Z7901 Long term (current) use of anticoagulants: Secondary | ICD-10-CM | POA: Insufficient documentation

## 2016-11-17 DIAGNOSIS — F17210 Nicotine dependence, cigarettes, uncomplicated: Secondary | ICD-10-CM | POA: Insufficient documentation

## 2016-11-17 DIAGNOSIS — I429 Cardiomyopathy, unspecified: Secondary | ICD-10-CM | POA: Insufficient documentation

## 2016-11-17 DIAGNOSIS — N183 Chronic kidney disease, stage 3 (moderate): Secondary | ICD-10-CM | POA: Insufficient documentation

## 2016-11-17 LAB — BASIC METABOLIC PANEL
Anion gap: 13 (ref 5–15)
BUN: 12 mg/dL (ref 6–20)
CALCIUM: 9.3 mg/dL (ref 8.9–10.3)
CO2: 26 mmol/L (ref 22–32)
Chloride: 95 mmol/L — ABNORMAL LOW (ref 101–111)
Creatinine, Ser: 1.62 mg/dL — ABNORMAL HIGH (ref 0.61–1.24)
GFR calc Af Amer: >60 mL/min (ref >60)
GFR, EST NON AFRICAN AMERICAN: 52 mL/min — AB (ref >60)
Glucose, Bld: 125 mg/dL — ABNORMAL HIGH (ref 65–99)
POTASSIUM: 3 mmol/L — AB (ref 3.5–5.1)
Sodium: 134 mmol/L — ABNORMAL LOW (ref 135–145)

## 2016-11-17 MED ORDER — ISOSORBIDE MONONITRATE ER 30 MG PO TB24: 30.0000 mg | ORAL_TABLET | Freq: Two times a day (BID) | ORAL | 6 refills | Status: AC

## 2016-11-17 MED ORDER — HYDRALAZINE HCL 50 MG PO TABS: 50.0000 mg | ORAL_TABLET | Freq: Three times a day (TID) | ORAL | 6 refills | Status: AC

## 2016-11-17 MED ORDER — NICOTINE 14 MG/24HR TD PT24: 14.0000 mg | MEDICATED_PATCH | Freq: Every day | TRANSDERMAL | 2 refills | Status: AC

## 2016-11-17 NOTE — Addendum Note (Signed)
Encounter addended by: Scarlette Calico, RN on: 11/17/2016 10:56 AM<BR>    Actions taken: Pharmacy for encounter modified, Diagnosis association updated, Order list changed, Sign clinical note

## 2016-11-17 NOTE — Progress Notes (Signed)
Paramedicine Encounter    Patient ID: Donald Brady, male    DOB: 1976/01/21, 41 y.o.   MRN: [id number]   Patient Care Team: Florinda Marker, MD as PCP - General  Patient Active Problem List   Diagnosis Date Noted  . Back pain   . Morbid obesity due to excess calories ([place])   . Presbyopia 01/26/2016  . Alcohol abuse 01/26/2016  . Hypertensive heart disease 10/01/2015  . Chronic systolic CHF (congestive heart failure) ([place]) 10/01/2015  . Umbilical hernia without obstruction and without gangrene 08/29/2015  . Pre-diabetes 08/29/2015  . CKD (chronic kidney disease), stage III 08/19/2015  . Nonobstructive cardiomyopathy ([place]) 05/05/2015  . Cocaine use 12/23/2014  . Depression 10/10/2014  . GERD (gastroesophageal reflux disease) 07/24/2014  . Polycythemia, secondary 05/23/2014  . Factor 5 Leiden mutation, heterozygous ([place]) 05/22/2014  . Hyperlipidemia 04/23/2014  . Sinusitis, chronic 04/23/2014  . Severe obstructive sleep apnea 11/20/2013  . Essential hypertension, benign 08/04/2013  . Asthma 02/20/2013  . Osteoarthritis of left knee 06/28/2012  . Long term current use of anticoagulant therapy 12/02/2011  . Bilateral pulmonary embolism ([place]) 11/27/2011  . Tobacco abuse 11/27/2011    Current Outpatient Prescriptions:  .  atorvastatin (LIPITOR) 40 MG tablet, TAKE 1 Tablet BY MOUTH ONCE DAILY, Disp: 90 tablet, Rfl: 0 .  buPROPion (WELLBUTRIN XL) 150 MG 24 hr tablet, Take 1 tablet (150 mg total) by mouth daily. IM program, [facility], Disp: 30 tablet, Rfl: 1 .  carvedilol (COREG) 12.5 MG tablet, Take 1 tablet (12.5 mg total) by mouth 2 (two) times daily., Disp: 180 tablet, Rfl: 3 .  digoxin (LANOXIN) 0.125 MG tablet, Take 1 tablet (0.125 mg total) by mouth daily., Disp: 30 tablet, Rfl: 6 .  folic acid (FOLVITE) 1 MG tablet, Take 1 tablet (1 mg total) by mouth daily., Disp: 90 tablet, Rfl: 3 .  furosemide (LASIX) 40 MG tablet, Take 80 mg by mouth 2 (two) times daily., Disp: , Rfl:  .   hydrALAZINE (APRESOLINE) 25 MG tablet, Take 1.5 tablets (37.5 mg total) by mouth 3 (three) times daily., Disp: 405 tablet, Rfl: 3 .  isosorbide mononitrate (IMDUR) 30 MG 24 hr tablet, Take 30 mg by mouth daily., Disp: , Rfl:  .  metolazone (ZAROXOLYN) 2.5 MG tablet, Take 1 tablet (2.5 mg total) by mouth 3 (three) times a week. Tuesday, Thursday, Saturday, Disp: 15 tablet, Rfl: 2 .  mometasone-formoterol (DULERA) 200-5 MCG/ACT AERO, Inhale 2 puffs into the lungs 2 (two) times daily., Disp: 8.8 g, Rfl: 3 .  Multiple Vitamin (MULTIVITAMIN) tablet, Take 1 tablet by mouth daily., Disp: , Rfl:  .  nicotine (NICOTROL) 10 MG inhaler, Inhale 1 Cartridge (1 continuous puffing total) into the lungs as needed for smoking cessation. Use at least 6 cartridges daily x 3-4 weeks, Disp: 504 each, Rfl: 2 .  potassium chloride SA (K-DUR,KLOR-CON) 20 MEQ tablet, Take 40 meq (2 tabs) in the AM and 20 meq one tab in the PM.Take an add'l 20 mEq (1 tablet) when take metolazone, Disp: 72 tablet, Rfl: 0 .  PROVENTIL HFA 108 (90 Base) MCG/ACT inhaler, INHALE 2 PUFFS BY MOUTH EVERY 6 HOURS AS NEEDED FOR COUGHING, WHEEZING, OR SHORTNESS OF BREATH, Disp: 18 g, Rfl: 6 .  rivaroxaban (XARELTO) 20 MG TABS tablet, Take 1 tablet (20 mg total) by mouth daily with supper., Disp: 90 tablet, Rfl: 3 .  spironolactone (ALDACTONE) 25 MG tablet, Take 1 tablet (25 mg total) by mouth daily., Disp: 30 tablet,  Rfl: 6 .  venlafaxine XR (EFFEXOR XR) 37.5 MG 24 hr capsule, Take 1 capsule (37.5 mg total) by mouth daily with breakfast. IM program, [facility], Disp: 30 capsule, Rfl: 0 Allergies  Allergen Reactions  . Bee Venom Anaphylaxis  . Honey Anaphylaxis    RAW HONEY  . Shellfish Allergy Shortness Of Breath and Swelling  . Ibuprofen Hives and Swelling  . Betadine [Povidone Iodine]     BECAUSE OF SHELLFISH ALLERGY     Social History   Social History  . Marital status: Single    Spouse name: N/A  . Number of children: 1  . Years of  education: N/A   Occupational History  . Disabled- Architect   .  Other   Social History Main Topics  . Smoking status: Former Smoker    Packs/day: 0.10    Years: 21.00    Types: Cigarettes    Quit date: 07/31/2016  . Smokeless tobacco: Never Used     Comment: a pack a week  . Alcohol use 0.0 oz/week     Comment: everyday  . Drug use: Yes     Comment: Marijuana.  . Sexual activity: Not on file   Other Topics Concern  . Not on file   Social History Narrative   Lives in [place] with his dog.  Has a 50 year old daughter that he sees daily.  Has good relationship with daughter's mom.  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     Physical Exam      SAFE - 09/30/16 1100      Situation   Admitting diagnosis chf   Heart failure history Exisiting   Readmitted within 30 days Yes   Hospital admission within past 12 months Yes   number of hospital admissions 5   number of ED visits 10     Assessment   Lives alone No   Primary support person Mom/step-dad/girlfriend   Mode of transportation personal car   Other services involved None   Home equipement Scale     Weight   Weighs self daily Yes   Scale provided No  has own scale   Records on weight chart No     Resources   Has "Living better w/heart failure" book Yes   Has HF Zone tool Yes   Able to identify yellow zone signs/when to call MD Yes   Records zone daily No     Medications   Uses a pill box Yes   Who stocks the pill box Self   Pill box checked this visit Yes   Pill box refilled this visit No   Difficulty obtaining medications No   Mail order medications Yes   New medications at home today No   Next scheduled delivery of medications 10/07/16   Missed one or more doses of medications per week Yes   How many missed doses this week 2  Xarelto     Nutrition   Patient receives meals on wheels No   Patient follows low sodium diet No   Has foods at home that meet the current  recommended diet Yes   Patient follows low sugar/card diet Yes   Nutritional concerns/issues n     Activity Level   ADL's/Mobility Independent   How many feet can patient ambulate >75f    Typical activity level Active   Barriers None   Actvity tolerance: NYHA Class 2     Urine  Difficulty urinating No   Changes in urine None     Time spent with patient   Time spent with patient  45 Minutes        No future appointments. Wt (!) 366 lb (166 kg)   BMI 46.36 kg/m  Weight yesterday-367 Last visit weight-358  Met pt in clinic today. He had ECHO this morning. His b/p is elevated-he states he took all his meds, he took his meds this morning. His weight is up 9 lbs from last visit. He states he has not been eating heart healthy, but barely eats, very poor appetite.  His EF has improved to 40-45%. Still waiting to get the CPAP. Been drinking approx 1 gallon of juice a day Drinks a few beers daily, he reports that it depends on his depression.  Pt does own pill box. imdur to 30BID and hydralazine to '50mg'$  TID and Dr Martinique increased his carvedilol to 12.'5mg'$  BID. Also going to rx him nicotine patch. Also can cut back on metolazone to twice a week on tues/fridays.   ACTION: Home visit completed Next visit planned for next week   Marylouise Stacks, EMT-Paramedic 11/17/16

## 2016-11-17 NOTE — Progress Notes (Signed)
  Echocardiogram 2D Echocardiogram has been performed.  Jennette Dubin 11/17/2016, 9:29 AM

## 2016-11-17 NOTE — Patient Instructions (Signed)
INCREASE Hydralazine to 50 mg, one tab three times daily INCREASE Imdur to 30 mg, one tab twice a day  Labs today, no news is good news!   Follow up with CPAP, give [place] a call in 1 week if no one has reached out to you  Your physician recommends that you schedule a follow-up appointment in: 4 months with Dr Haroldine Laws   Do the following things EVERYDAY: 1) Weigh yourself in the morning before breakfast. Write it down and keep it in a log. 2) Take your medicines as prescribed 3) Eat low salt foods-Limit salt (sodium) to 2000 mg per day.  4) Stay as active as you can everyday 5) Limit all fluids for the day to less than 2 liters

## 2016-11-17 NOTE — Progress Notes (Addendum)
ADVANCED HF CLINIC NOTE  PCP: Dr Quay Burow  Primary Cardiologist: Dr Martinique  HF MD: Dr Haroldine Laws   HPI: Donald Brady is a 41 year old with a history of chronic systolic heart failure (suspected ETOH), NICM. PE 2016, factor V leiden, asthma, htn, osa, ckd stage III, major depression disorder, and ETOH abuse. LHC 2016 non obstructive cad. No family history of heart disease.   Hospitalized in July 9323 with A/C systolic heart failure. Left AMA. Admitted January 2nd with A/C systolic heart failure. Diuresed with IV lasix and transitioned to lasix 40 mg daily. Discharge weight was  383 pounds.  Readmitted January 8th, 2018 with increased dyspnea and marked volume overload. Diuresed with IV lasix and started on dig, spiro, and bidil. Discharge 364 pounds.   Evaluated in [facility] ED January 15th due to difficulty urinating. Given IV lasix. UA was negative for UTI.   He returns for HF follow up. Here with paramedicine team. Feels good. Breathing well. Senies dyspnea. Says if he goes up steps his knees kill him. Able to till up the garden last week. No swelling. Has to sleep on his knees due to SOB.  Failed sleep study AHI =43.  Marland KitchenSleeps with HOB elevated. Weight at home up and down 358-367 pounds. Drinks a lot of fluid about a gallon of cranberry juice a day. Drinks 2-4 beers a day due to "depression mode." Not using cocaine. Smoking 1/2 pack per day.Taking all medications. Followed by Paramedicine. BP at home 120/90. Taking metolazone T/TH/Sat  Echo today 11/17/16: EF 40-45% RV normal. (viewed images personally)   ECHO 08/18/2016: EF 20%  RV mildly dilated and moderately HK . Grade II DD  LHC 02/2015:  1. 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 Moderate LV dysfunction   Review of Systems  Constitutional: Positive for malaise/fatigue.  HENT: Negative.   Eyes: Negative.   Respiratory: Negative for cough.   Cardiovascular: Positive for orthopnea. Negative for leg swelling.   Gastrointestinal: Negative.   Genitourinary: Negative.   Musculoskeletal: Positive for back pain and joint pain.  Skin: Negative.   Endo/Heme/Allergies: Negative.   Psychiatric/Behavioral: Positive for depression and substance abuse.       SH:  Social History   Social History  . Marital status: Single    Spouse name: N/A  . Number of children: 1  . Years of education: N/A   Occupational History  . Disabled- Architect   .  Other   Social History Main Topics  . Smoking status: Former Smoker    Packs/day: 0.10    Years: 21.00    Types: Cigarettes    Quit date: 07/31/2016  . Smokeless tobacco: Never Used     Comment: a pack a week  . Alcohol use 0.0 oz/week     Comment: everyday  . Drug use: Yes     Comment: Marijuana.  . Sexual activity: Not on file   Other Topics Concern  . Not on file   Social History Narrative   Lives in [place] with his dog.  Has a 22 year old daughter that he sees daily.  Has good relationship with daughter's mom.  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     FH:  Family History  Problem Relation Age of Onset  . Diabetes Mother   . Hypertension Mother   . Hearing loss Father     died in his 46's  . Cancer  Father     unknown type  . Clotting disorder Father   . Heart disease Father   . Diabetes Maternal Aunt   . Hypertension Maternal Aunt   . Diabetes Maternal Uncle   . Hypertension Maternal Uncle   . Stomach cancer Maternal Uncle   . Lung cancer Paternal Barbaraann Rondo     was a smoker  . Breast cancer Maternal Aunt   . Stomach cancer Maternal Uncle   . Liver disease Maternal Uncle     drinker  . Kidney disease Maternal Aunt     Past Medical History:  Diagnosis Date  . Anxiety   . Arthritis    left knee  . Asthma    no inhaler use in 1 year  . CHF (congestive heart failure) ([place])   . Factor V Leiden ([place])   . GERD (gastroesophageal reflux disease)   . History of pulmonary embolus (PE)    takes  Xarelto  . History of seizures    no known cause, per pt. - has been "years" since last seizure; no longer on anticonvulsant  . HLD (hyperlipidemia)   . Hypertension    states BP "runs high"; has been on med. "a while"  . Lateral meniscus tear 01/2015   left knee  . Shortness of breath dyspnea   . Sleep apnea    had sleep study 10/2013:  "severe" sleep apnea, states could not afford CPAP machine    Current Outpatient Prescriptions  Medication Sig Dispense Refill  . atorvastatin (LIPITOR) 40 MG tablet TAKE 1 Tablet BY MOUTH ONCE DAILY 90 tablet 0  . buPROPion (WELLBUTRIN XL) 150 MG 24 hr tablet Take 1 tablet (150 mg total) by mouth daily. IM program, Donald Brady 30 tablet 1  . carvedilol (COREG) 12.5 MG tablet Take 1 tablet (12.5 mg total) by mouth 2 (two) times daily. 180 tablet 3  . digoxin (LANOXIN) 0.125 MG tablet Take 1 tablet (0.125 mg total) by mouth daily. 30 tablet 6  . folic acid (FOLVITE) 1 MG tablet Take 1 tablet (1 mg total) by mouth daily. 90 tablet 3  . furosemide (LASIX) 40 MG tablet Take 80 mg by mouth 2 (two) times daily.    . hydrALAZINE (APRESOLINE) 25 MG tablet Take 1.5 tablets (37.5 mg total) by mouth 3 (three) times daily. 405 tablet 3  . isosorbide mononitrate (IMDUR) 30 MG 24 hr tablet Take 30 mg by mouth daily.    . metolazone (ZAROXOLYN) 2.5 MG tablet Take 1 tablet (2.5 mg total) by mouth 3 (three) times a week. Tuesday, Thursday, Saturday 15 tablet 2  . mometasone-formoterol (DULERA) 200-5 MCG/ACT AERO Inhale 2 puffs into the lungs 2 (two) times daily. 8.8 g 3  . Multiple Vitamin (MULTIVITAMIN) tablet Take 1 tablet by mouth daily.    . nicotine (NICOTROL) 10 MG inhaler Inhale 1 Cartridge (1 continuous puffing total) into the lungs as needed for smoking cessation. Use at least 6 cartridges daily x 3-4 weeks 504 each 2  . potassium chloride SA (K-DUR,KLOR-CON) 20 MEQ tablet Take 40 meq (2 tabs) in the AM and 20 meq one tab in the PM.Take an add'l 20 mEq (1 tablet) when  take metolazone 72 tablet 0  . PROVENTIL HFA 108 (90 Base) MCG/ACT inhaler INHALE 2 PUFFS BY MOUTH EVERY 6 HOURS AS NEEDED FOR COUGHING, WHEEZING, OR SHORTNESS OF BREATH 18 g 6  . rivaroxaban (XARELTO) 20 MG TABS tablet Take 1 tablet (20 mg total) by mouth daily with supper. Donald Brady  tablet 3  . spironolactone (ALDACTONE) 25 MG tablet Take 1 tablet (25 mg total) by mouth daily. 30 tablet 6  . venlafaxine XR (EFFEXOR XR) 37.5 MG 24 hr capsule Take 1 capsule (37.5 mg total) by mouth daily with breakfast. IM program, Donald Brady 30 capsule 0   No current facility-administered medications for this encounter.     Vitals:   11/17/16 0959  BP: (!) 162/100  Pulse: (!) 104  SpO2: 98%  Weight: (!) 366 lb (166 kg)   Wt Readings from Last 3 Encounters:  11/17/16 (!) 366 lb (166 kg)  11/17/16 (!) 366 lb (166 kg)  11/15/16 (!) 367 lb (166.5 kg)     PHYSICAL EXAM: General:  Obese. Walked in the clinic without difficulty. Mother present  HEENT: Normal anicteric Neck: supple. Thick hard to see JVP - does not look overly elevated  Carotids 2+ bilaterally; no bruits. No thyromegaly or nodule noted.  Cor: PMI normal. RRR. No murmur Lungs: CTAB no wheezing Abdomen: obese, NT, nondistended. no HSM. No bruits or masses. +BS  Extremities: no cyanosis, clubbing, rash,  Warm no edema  Neuro: alert & orientedx3, cranial nerves grossly intact. Moves all 4 extremities w/o difficulty. Affect pleasant.  ASSESSMENT & PLAN: 1. Chronic Combined Systolic/Diastolic HF- NICM , LHC 5697 Non obs CAD. ECHO EF 20% RV moderate HK. Reduced EF likely from ETOH abuse and HTN.  - Echo to day with improved EF 40-45% (images reviewed personally)  - Improved NYHA II. Volume status stable. Continue lasix 80 mg BID. Continue Metolazone 2.5 mg Tues/Thur/Sat for now. Would like to cut this back to 2x week if he can cut back on fluid intake.   - Long talk about need for fluid restriction  - Continue potassium 20 meq twice a day.  -  Continue spiro 25 mg daily.  - Increase hydralazine 50 mg TID.  - Increase Imdur to 30 bid  - Continue coreg to 12.5 mg twice a day.   - Not on ACE/ARB/ARNO due to CKD - BMET today. Reinforced daily weights, low salt food choices, and limiting fluid intake to < 2 liters per day.   2. CKD II - Baseline 1.5-2.0. Check BMET today.    3. H/O PE 2016- chronic xarelto. No bleeding problems.   4. OSA - recent sleep study positive. We will f/u on CPAP. 5. Asthma 6. Factor V Leiden 7. Major Depressive Disorder 8. ETOH abuse - Ongoing alcohol abuse . Discussed need to cut back He declines rehab.   9. Morbid obesity - Needs to reduce portions and increase activity as able.   10. Smoker  - will try nicotine patch 11. HTN:  - improved but still elevated at time. Increase hydral/Imdur  Glori Bickers, MD  10:15 AM

## 2016-11-18 ENCOUNTER — Telehealth (HOSPITAL_COMMUNITY): Payer: Self-pay | Admitting: *Deleted

## 2016-11-18 DIAGNOSIS — E876 Hypokalemia: Secondary | ICD-10-CM

## 2016-11-18 MED ORDER — POTASSIUM CHLORIDE CRYS ER 20 MEQ PO TBCR: EXTENDED_RELEASE_TABLET | ORAL | 0 refills | Status: AC

## 2016-11-18 NOTE — Telephone Encounter (Signed)
Notes recorded by Harvie Junior, CMA on 11/18/2016 at 11:15 AM EDT Pt aware of results and medication change. ------  Notes recorded by Jolaine Artist, MD on 11/17/2016 at 7:38 PM EDT Start kcl 40 daily. Have him take 80 the first day. thanks    Ref Range & Units 1d ago (11/17/16) 19mo ago (10/15/16) 54mo ago (10/04/16)   Sodium 135 - 145 mmol/L 134   132   134     Potassium 3.5 - 5.1 mmol/L 3.0   3.5  3.4     Chloride 101 - 111 mmol/L 95   89   96     CO2 22 - 32 mmol/L 26  29  24     Glucose, Bld 65 - 99 mg/dL 125   148   139     BUN 6 - 20 mg/dL 12  30   24      Creatinine, Ser 0.61 - 1.24 mg/dL 1.62   2.11   1.94     Calcium 8.9 - 10.3 mg/dL 9.3  9.3  9.1    GFR calc non Af Amer >60 mL/min 52   38   42     GFR calc Af Amer >60 mL/min >60  44CM   48CM    Comments: (NOTE)

## 2016-11-23 ENCOUNTER — Telehealth: Payer: Self-pay | Admitting: *Deleted

## 2016-11-23 NOTE — Telephone Encounter (Signed)
Patient had BIPAP titration on 10/08/2016 by Dr. Annamaria Boots.  Please send this note to Dr. Keturah Barre with pulmonary  (Dr. Beryle Beams ordered this and Dr. Annamaria Boots follows noncardiac patients for sleep)

## 2016-11-23 NOTE — Telephone Encounter (Signed)
-----   Message from Scarlette Calico, RN sent at 11/23/2016  4:02 PM EDT ----- Marykay Lex guys can you please help me.  This pt had an abnormal sleep study on 10/14/16, i'm not sure who ordered it, maybe a hospitalist.  Regardless he needs cpap, a referral was placed for Dr Radford Pax, can you guys get him in with her, or start cpap and then sch him f/u however it needs to happen is fine just as long as he can get cpap please.  Let me know if I need to do something else, thanks

## 2016-11-23 NOTE — Telephone Encounter (Signed)
This patient had a sleep study done on 10/08/2016.    To Dr. Radford Pax to review and give recommendations on how to proceed as he has never been seen or treated.

## 2016-11-23 NOTE — Telephone Encounter (Signed)
Message has been routed to Dr. Keturah Barre at [facility] 11/23/16.

## 2016-11-24 NOTE — Telephone Encounter (Signed)
ATC pt, unable to leave vm due to mailbox being full. Wicb.

## 2016-11-24 NOTE — Telephone Encounter (Signed)
This patient needs to see any of the sleep doctors to establish.Marland Kitchen

## 2016-11-26 ENCOUNTER — Other Ambulatory Visit (HOSPITAL_COMMUNITY): Payer: Self-pay

## 2016-11-26 ENCOUNTER — Telehealth (HOSPITAL_COMMUNITY): Payer: Self-pay | Admitting: *Deleted

## 2016-11-26 NOTE — Progress Notes (Signed)
Paramedicine Encounter    Patient ID: Donald Brady, male    DOB: 1976/03/09, 41 y.o.   MRN: [id number]   Patient Care Team: Florinda Marker, MD as PCP - General  Patient Active Problem List   Diagnosis Date Noted  . Back pain   . Morbid obesity due to excess calories ([place])   . Presbyopia 01/26/2016  . Alcohol abuse 01/26/2016  . Hypertensive heart disease 10/01/2015  . Chronic systolic CHF (congestive heart failure) ([place]) 10/01/2015  . Umbilical hernia without obstruction and without gangrene 08/29/2015  . Pre-diabetes 08/29/2015  . CKD (chronic kidney disease), stage III 08/19/2015  . Nonobstructive cardiomyopathy ([place]) 05/05/2015  . Cocaine use 12/23/2014  . Depression 10/10/2014  . GERD (gastroesophageal reflux disease) 07/24/2014  . Polycythemia, secondary 05/23/2014  . Factor 5 Leiden mutation, heterozygous ([place]) 05/22/2014  . Hyperlipidemia 04/23/2014  . Sinusitis, chronic 04/23/2014  . Severe obstructive sleep apnea 11/20/2013  . Essential hypertension, benign 08/04/2013  . Asthma 02/20/2013  . Osteoarthritis of left knee 06/28/2012  . Long term current use of anticoagulant therapy 12/02/2011  . Bilateral pulmonary embolism ([place]) 11/27/2011  . Tobacco abuse 11/27/2011    Current Outpatient Prescriptions:  .  atorvastatin (LIPITOR) 40 MG tablet, TAKE 1 Tablet BY MOUTH ONCE DAILY, Disp: 90 tablet, Rfl: 0 .  buPROPion (WELLBUTRIN XL) 150 MG 24 hr tablet, Take 1 tablet (150 mg total) by mouth daily. IM program, [facility], Disp: 30 tablet, Rfl: 1 .  carvedilol (COREG) 12.5 MG tablet, Take 1 tablet (12.5 mg total) by mouth 2 (two) times daily., Disp: 180 tablet, Rfl: 3 .  digoxin (LANOXIN) 0.125 MG tablet, Take 1 tablet (0.125 mg total) by mouth daily., Disp: 30 tablet, Rfl: 6 .  folic acid (FOLVITE) 1 MG tablet, Take 1 tablet (1 mg total) by mouth daily., Disp: 90 tablet, Rfl: 3 .  furosemide (LASIX) 40 MG tablet, Take 80 mg by mouth 2 (two) times daily., Disp: , Rfl:  .   hydrALAZINE (APRESOLINE) 50 MG tablet, Take 1 tablet (50 mg total) by mouth 3 (three) times daily., Disp: 90 tablet, Rfl: 6 .  isosorbide mononitrate (IMDUR) 30 MG 24 hr tablet, Take 1 tablet (30 mg total) by mouth 2 (two) times daily., Disp: 60 tablet, Rfl: 6 .  metolazone (ZAROXOLYN) 2.5 MG tablet, Take 1 tablet (2.5 mg total) by mouth 3 (three) times a week. Tuesday, Thursday, Saturday, Disp: 15 tablet, Rfl: 2 .  mometasone-formoterol (DULERA) 200-5 MCG/ACT AERO, Inhale 2 puffs into the lungs 2 (two) times daily., Disp: 8.8 g, Rfl: 3 .  Multiple Vitamin (MULTIVITAMIN) tablet, Take 1 tablet by mouth daily., Disp: , Rfl:  .  nicotine (NICOTROL) 10 MG inhaler, Inhale 1 Cartridge (1 continuous puffing total) into the lungs as needed for smoking cessation. Use at least 6 cartridges daily x 3-4 weeks, Disp: 504 each, Rfl: 2 .  potassium chloride SA (K-DUR,KLOR-CON) 20 MEQ tablet, Take 40 meq (2 tabs) by mouth daily .Take an add'l 20 mEq (1 tablet) when take metolazone, Disp: 90 tablet, Rfl: 0 .  PROVENTIL HFA 108 (90 Base) MCG/ACT inhaler, INHALE 2 PUFFS BY MOUTH EVERY 6 HOURS AS NEEDED FOR COUGHING, WHEEZING, OR SHORTNESS OF BREATH, Disp: 18 g, Rfl: 6 .  rivaroxaban (XARELTO) 20 MG TABS tablet, Take 1 tablet (20 mg total) by mouth daily with supper., Disp: 90 tablet, Rfl: 3 .  spironolactone (ALDACTONE) 25 MG tablet, Take 1 tablet (25 mg total) by mouth daily., Disp: 30 tablet,  Rfl: 6 .  venlafaxine XR (EFFEXOR XR) 37.5 MG 24 hr capsule, Take 1 capsule (37.5 mg total) by mouth daily with breakfast. IM program, [facility], Disp: 30 capsule, Rfl: 0 .  nicotine (NICODERM CQ - DOSED IN MG/24 HOURS) 14 mg/24hr patch, Place 1 patch (14 mg total) onto the skin daily. (Patient not taking: Reported on 11/26/2016), Disp: 28 patch, Rfl: 2 Allergies  Allergen Reactions  . Bee Venom Anaphylaxis  . Honey Anaphylaxis    RAW HONEY  . Shellfish Allergy Shortness Of Breath and Swelling  . Ibuprofen Hives and Swelling   . Betadine [Povidone Iodine]     BECAUSE OF SHELLFISH ALLERGY     Social History   Social History  . Marital status: Single    Spouse name: N/A  . Number of children: 1  . Years of education: N/A   Occupational History  . Disabled- Architect   .  Other   Social History Main Topics  . Smoking status: Former Smoker    Packs/day: 0.10    Years: 21.00    Types: Cigarettes    Quit date: 07/31/2016  . Smokeless tobacco: Never Used     Comment: a pack a week  . Alcohol use 0.0 oz/week     Comment: everyday  . Drug use: Yes     Comment: Marijuana.  . Sexual activity: Not on file   Other Topics Concern  . Not on file   Social History Narrative   Lives in [place] with his dog.  Has a 86 year old daughter that he sees daily.  Has good relationship with daughter's mom.  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     Physical Exam  Constitutional: He is oriented to person, place, and time.  Neck: Normal range of motion. No JVD present.  Cardiovascular: Regular rhythm.   Pulmonary/Chest: Effort normal and breath sounds normal.  Abdominal: Soft. He exhibits no distension.  Musculoskeletal: He exhibits edema.  Neurological: He is alert and oriented to person, place, and time.  Skin: Skin is warm.  Psychiatric: He has a normal mood and affect.        SAFE - 09/30/16 1100      Situation   Admitting diagnosis chf   Heart failure history Exisiting   Readmitted within 30 days Yes   Hospital admission within past 12 months Yes   number of hospital admissions 5   number of ED visits 10     Assessment   Lives alone No   Primary support person Mom/step-dad/girlfriend   Mode of transportation personal car   Other services involved None   Home equipement Scale     Weight   Weighs self daily Yes   Scale provided No  has own scale   Records on weight chart No     Resources   Has "Living better w/heart failure" book Yes   Has HF Zone tool  Yes   Able to identify yellow zone signs/when to call MD Yes   Records zone daily No     Medications   Uses a pill box Yes   Who stocks the pill box Self   Pill box checked this visit Yes   Pill box refilled this visit No   Difficulty obtaining medications No   Mail order medications Yes   New medications at home today No   Next scheduled delivery of medications 10/07/16   Missed one or more doses of  medications per week Yes   How many missed doses this week 2  Xarelto     Nutrition   Patient receives meals on wheels No   Patient follows low sodium diet No   Has foods at home that meet the current recommended diet Yes   Patient follows low sugar/card diet Yes   Nutritional concerns/issues n     Activity Level   ADL's/Mobility Independent   How many feet can patient ambulate >40ft    Typical activity level Active   Barriers None   Actvity tolerance: NYHA Class 2     Urine   Difficulty urinating No   Changes in urine None     Time spent with patient   Time spent with patient  45 Minutes        Future Appointments Date Time Provider [place]  12/15/2016 9:00 AM [facility] PA/NP [facility] None   BP (!) 130/100 (BP Location: Right Arm, Patient Position: Sitting, Cuff Size: Large)   Pulse (!) 110   Resp 18   Wt (!) 371 lb (168.3 kg)   SpO2 95%   BMI 47.00 kg/m  Weight yesterday- 374 Last visit weight- 366 CBG- 132  Mr Desroches was seen at home and reports feeling well. He has minor edema in his lower legs but he says that he has been on his feet a lot lately and that has contributed to the swelling. His BP was elevated today but it was noted that he had not been properly taking carvedilol. I explained his doses to him and he is now taking the correct amount. He denied dizziness, H/A, and SOB.   Jacquiline Doe, EMT 11/26/16  ACTION: Home visit completed Next visit planned for 1 week

## 2016-11-26 NOTE — Telephone Encounter (Signed)
Zach with Paramed called to report that patient's BP was 130/100 and HR was 110.  Patient is asymptomatic. Thedore Mins verified patient's medications with him and he was taking his Coreg wrong.  Prescribed dose was 12.5 mg BID.  Patient was taking 3.25 mg Tablet BID.  Thedore Mins educated patient on proper dose and will continue to follow patient.

## 2016-11-29 ENCOUNTER — Telehealth: Payer: Self-pay | Admitting: Pharmacist

## 2016-11-29 DIAGNOSIS — F329 Major depressive disorder, single episode, unspecified: Secondary | ICD-10-CM

## 2016-11-29 DIAGNOSIS — F32A Depression, unspecified: Secondary | ICD-10-CM

## 2016-11-29 MED ORDER — BUPROPION HCL ER (XL) 150 MG PO TB24: 150.0000 mg | ORAL_TABLET | Freq: Two times a day (BID) | ORAL | 1 refills | Status: DC

## 2016-11-29 MED ORDER — BUPROPION HCL ER (XL) 300 MG PO TB24: 300.0000 mg | ORAL_TABLET | Freq: Every day | ORAL | 1 refills | Status: AC

## 2016-11-29 MED FILL — BUPROPION HCL XL 300 MG TAB: 300 | 30 days supply | Qty: 30 | Fill #0

## 2016-11-29 NOTE — Progress Notes (Signed)
Patient states improved efficacy with bupropion and is tolerating it well. He reports no side effects.   Patient states he was unable to obtain the NRT patch and that he needs a bit more support for smoking cessation. PCP approves increasing bupropion to 150 mg BID. I instructed patient to schedule a follow up appointment with PCP. Patient verbalized understanding.

## 2016-11-29 NOTE — Progress Notes (Signed)
Bupropion formulary change for IM program to XL 300 mg daily

## 2016-11-29 NOTE — Addendum Note (Signed)
Addended by: Forde Dandy on: 11/29/2016 02:19 PM   Modules accepted: Orders

## 2016-12-02 ENCOUNTER — Emergency Department (HOSPITAL_COMMUNITY)
Admission: EM | Admit: 2016-12-02 | Discharge: 2016-12-03 | Disposition: A | Discharge status: Home / Self Care | Payer: Self-pay | Attending: Emergency Medicine | Admitting: Emergency Medicine

## 2016-12-02 ENCOUNTER — Encounter (HOSPITAL_COMMUNITY): Payer: Self-pay | Admitting: Emergency Medicine

## 2016-12-02 ENCOUNTER — Emergency Department (HOSPITAL_COMMUNITY): Payer: Self-pay

## 2016-12-02 DIAGNOSIS — N183 Chronic kidney disease, stage 3 (moderate): Secondary | ICD-10-CM | POA: Insufficient documentation

## 2016-12-02 DIAGNOSIS — J45909 Unspecified asthma, uncomplicated: Secondary | ICD-10-CM | POA: Insufficient documentation

## 2016-12-02 DIAGNOSIS — Z79899 Other long term (current) drug therapy: Secondary | ICD-10-CM | POA: Insufficient documentation

## 2016-12-02 DIAGNOSIS — I130 Hypertensive heart and chronic kidney disease with heart failure and stage 1 through stage 4 chronic kidney disease, or unspecified chronic kidney disease: Secondary | ICD-10-CM | POA: Insufficient documentation

## 2016-12-02 DIAGNOSIS — E876 Hypokalemia: Secondary | ICD-10-CM | POA: Insufficient documentation

## 2016-12-02 DIAGNOSIS — R079 Chest pain, unspecified: Secondary | ICD-10-CM

## 2016-12-02 DIAGNOSIS — Z87891 Personal history of nicotine dependence: Secondary | ICD-10-CM | POA: Insufficient documentation

## 2016-12-02 DIAGNOSIS — I509 Heart failure, unspecified: Secondary | ICD-10-CM | POA: Insufficient documentation

## 2016-12-02 DIAGNOSIS — Z7901 Long term (current) use of anticoagulants: Secondary | ICD-10-CM | POA: Insufficient documentation

## 2016-12-02 LAB — BASIC METABOLIC PANEL
Anion gap: 12 (ref 5–15)
BUN: 14 mg/dL (ref 6–20)
CHLORIDE: 103 mmol/L (ref 101–111)
CO2: 22 mmol/L (ref 22–32)
CREATININE: 1.44 mg/dL — AB (ref 0.61–1.24)
Calcium: 8.9 mg/dL (ref 8.9–10.3)
GFR calc non Af Amer: 60 mL/min — ABNORMAL LOW (ref >60)
Glucose, Bld: 116 mg/dL — ABNORMAL HIGH (ref 65–99)
Potassium: 3.3 mmol/L — ABNORMAL LOW (ref 3.5–5.1)
Sodium: 137 mmol/L (ref 135–145)

## 2016-12-02 LAB — I-STAT TROPONIN, ED: Troponin i, poc: 0.04 ng/mL (ref 0.00–0.08)

## 2016-12-02 LAB — BRAIN NATRIURETIC PEPTIDE: B Natriuretic Peptide: 104.9 pg/mL — ABNORMAL HIGH (ref 0.0–100.0)

## 2016-12-02 LAB — CBC
HCT: 50.1 % (ref 39.0–52.0)
Hemoglobin: 17.3 g/dL — ABNORMAL HIGH (ref 13.0–17.0)
MCH: 29.6 pg (ref 26.0–34.0)
MCHC: 34.5 g/dL (ref 30.0–36.0)
MCV: 85.8 fL (ref 78.0–100.0)
PLATELETS: 269 10*3/uL (ref 150–400)
RBC: 5.84 MIL/uL — AB (ref 4.22–5.81)
RDW: 15.3 % (ref 11.5–15.5)
WBC: 9.5 10*3/uL (ref 4.0–10.5)

## 2016-12-02 NOTE — ED Provider Notes (Signed)
[place] DEPT Provider Note   CSN: [id number] Arrival date & time: 12/02/16  2142    History   Chief Complaint Chief Complaint  Patient presents with  . Chest Pain    HPI Donald Brady is a 41 y.o. male.  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 Patient reports onset of chest pain at 2000 tonight while sitting watching TV. He describes the pain as a tightness which would intermittently radiate towards his left shoulder. Symptoms associated with shortness of breath as well as diaphoresis. EMS report dyspnea on exertion. Patient had mild improvement to his symptoms with nitroglycerin. He was also given transient 24 mg aspirin. He did drink 4 12-ounce beers prior to arrival. Patient denies associated fever, leg swelling, nausea, vomiting, abdominal pain. No extremity weakness. Overall, patient reports improvement in his symptoms. He rates his chest tightness at 3/10 currently. He is followed by Dr. Martinique of [facility].  Last echo performed on 11/17/2016. EF has improved from 20% to 40-45%.  Cardiac Cath 02/2015 Conclusions 1. 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 Moderate LV dysfunction   The history is provided by the patient. No language interpreter was used.  Chest Pain      Past Medical History:  Diagnosis Date  . Anxiety   . Arthritis    left knee  . Asthma    no inhaler use in 1 year  . CHF (congestive heart failure) ([place])   . Factor V Leiden ([place])   . GERD (gastroesophageal reflux disease)   . History of pulmonary embolus (PE)    takes Xarelto  . History of seizures    no known cause, per pt. - has been "years" since last seizure; no longer on anticonvulsant  . HLD (hyperlipidemia)   . Hypertension    states BP  "runs high"; has been on med. "a while"  . Lateral meniscus tear 01/2015   left knee  . Shortness of breath dyspnea   . Sleep apnea    had sleep study 10/2013:  "severe" sleep apnea, states could not afford CPAP machine    Patient Active Problem List   Diagnosis Date Noted  . Back pain   . Morbid obesity due to excess calories ([place])   . Presbyopia 01/26/2016  . Alcohol abuse 01/26/2016  . Hypertensive heart disease 10/01/2015  . Chronic systolic CHF (congestive heart failure) ([place]) 10/01/2015  . Umbilical hernia without obstruction and without gangrene 08/29/2015  . Pre-diabetes 08/29/2015  . CKD (chronic kidney disease), stage III 08/19/2015  . Nonobstructive cardiomyopathy ([place]) 05/05/2015  . Cocaine use 12/23/2014  . Depression 10/10/2014  . GERD (gastroesophageal reflux disease) 07/24/2014  . Polycythemia, secondary 05/23/2014  . Factor 5 Leiden mutation, heterozygous ([place]) 05/22/2014  . Hyperlipidemia 04/23/2014  . Sinusitis, chronic 04/23/2014  . Severe obstructive sleep apnea 11/20/2013  . Essential hypertension, benign 08/04/2013  . Asthma 02/20/2013  . Osteoarthritis of left knee 06/28/2012  . Long term current use of anticoagulant therapy 12/02/2011  . Bilateral pulmonary embolism ([place]) 11/27/2011  . Tobacco abuse 11/27/2011    Past Surgical History:  Procedure Laterality Date  . CARDIAC CATHETERIZATION N/A 02/19/2015   Procedure: Left Heart Cath and Coronary Angiography;  Surgeon: Peter M Martinique, MD;  Location:  [place] INVASIVE CV LAB;  Service: Cardiovascular;  Laterality: N/A;  . CHONDROPLASTY Left 03/26/2015   Procedure: CHONDROPLASTY;  Surgeon: Leandrew Koyanagi, MD;  Location: [place];  Service: Orthopedics;  Laterality: Left;  . ESOPHAGOGASTRODUODENOSCOPY (EGD) WITH PROPOFOL  07/10/2014  . HAND SURGERY Right    states knuckle removed  . KNEE ARTHROSCOPY WITH LATERAL MENISECTOMY Left 03/26/2015   Procedure: LEFT KNEE ARTHROSCOPY WITH LATERAL MENISECTOMY   CHONDROPLASTY;  Surgeon: Leandrew Koyanagi, MD;  Location: [place];  Service: Orthopedics;  Laterality: Left;  . ORIF FEMUR FRACTURE Left   . VIDEO BRONCHOSCOPY Bilateral 12/06/2012   Procedure: VIDEO BRONCHOSCOPY WITHOUT FLUORO;  Surgeon: Juanito Doom, MD;  Location: [facility];  Service: Cardiopulmonary;  Laterality: Bilateral;       Home Medications    Prior to Admission medications   Medication Sig Start Date End Date Taking? Authorizing Provider  atorvastatin (LIPITOR) 40 MG tablet TAKE 1 Tablet BY MOUTH ONCE DAILY 11/17/16  Yes Alexa Angela Burke, MD  buPROPion (WELLBUTRIN XL) 300 MG 24 hr tablet Take 1 tablet (300 mg total) by mouth daily. Dose increase, please d/c previous prescription for bupropion. IM $4 program 11/29/16  Yes Alexa Angela Burke, MD  carvedilol (COREG) 12.5 MG tablet Take 1 tablet (12.5 mg total) by mouth 2 (two) times daily. 11/15/16 11/10/17 Yes Peter M Martinique, MD  digoxin (LANOXIN) 0.125 MG tablet Take 1 tablet (0.125 mg total) by mouth daily. 08/27/16  Yes Shirley Friar, PA-C  folic acid (FOLVITE) 1 MG tablet Take 1 tablet (1 mg total) by mouth daily. 10/01/16  Yes Alexa Angela Burke, MD  furosemide (LASIX) 40 MG tablet Take 80 mg by mouth 2 (two) times daily.   Yes Historical Provider, MD  hydrALAZINE (APRESOLINE) 50 MG tablet Take 1 tablet (50 mg total) by mouth 3 (three) times daily. 11/17/16  Yes Jolaine Artist, MD  isosorbide mononitrate (IMDUR) 30 MG 24 hr tablet Take 1 tablet (30 mg total) by mouth 2 (two) times daily. 11/17/16  Yes Jolaine Artist, MD  metolazone (ZAROXOLYN) 2.5 MG tablet Take 1 tablet (2.5 mg total) by mouth 3 (three) times a week. Tuesday, Thursday, Saturday 09/24/16 12/23/16 Yes Shirley Friar, PA-C  mometasone-formoterol (DULERA) 200-5 MCG/ACT AERO Inhale 2 puffs into the lungs 2 (two) times daily. 09/29/16  Yes Alexa Angela Burke, MD  Multiple Vitamin (MULTIVITAMIN) tablet Take 1 tablet by mouth daily.   Yes Historical  Provider, MD  nicotine (NICOTROL) 10 MG inhaler Inhale 1 Cartridge (1 continuous puffing total) into the lungs as needed for smoking cessation. Use at least 6 cartridges daily x 3-4 weeks 09/29/16  Yes Alexa Angela Burke, MD  potassium chloride SA (K-DUR,KLOR-CON) 20 MEQ tablet Take 40 meq (2 tabs) by mouth daily .Take an add'l 20 mEq (1 tablet) when take metolazone 11/18/16  Yes Jolaine Artist, MD  PROVENTIL HFA 108 (90 Base) MCG/ACT inhaler INHALE 2 PUFFS BY MOUTH EVERY 6 HOURS AS NEEDED FOR COUGHING, WHEEZING, OR SHORTNESS OF BREATH 11/17/16  Yes Alexa Angela Burke, MD  rivaroxaban (XARELTO) 20 MG TABS tablet Take 1 tablet (20 mg total) by mouth daily with supper. 03/18/16  Yes Alexa Angela Burke, MD  spironolactone (ALDACTONE) 25 MG tablet Take 1 tablet (25 mg total) by mouth daily. 08/27/16  Yes Shirley Friar, PA-C  venlafaxine XR (EFFEXOR XR) 37.5 MG 24 hr capsule Take 1 capsule (37.5 mg total) by mouth daily with breakfast. IM program, [facility]  fund 11/08/16  Yes Alexa Angela Burke, MD  nicotine (NICODERM CQ - DOSED IN MG/24 HOURS) 14 mg/24hr patch Place 1 patch (14 mg total) onto the skin daily. Patient not taking: Reported on 11/26/2016 11/17/16   Jolaine Artist, MD    Family History Family History  Problem Relation Age of Onset  . Diabetes Mother   . Hypertension Mother   . Hearing loss Father     died in his 87's  . Cancer Father     unknown type  . Clotting disorder Father   . Heart disease Father   . Diabetes Maternal Aunt   . Hypertension Maternal Aunt   . Diabetes Maternal Uncle   . Hypertension Maternal Uncle   . Stomach cancer Maternal Uncle   . Lung cancer Paternal Barbaraann Rondo     was a smoker  . Breast cancer Maternal Aunt   . Stomach cancer Maternal Uncle   . Liver disease Maternal Uncle     drinker  . Kidney disease Maternal Aunt     Social History Social History  Substance Use Topics  . Smoking status: Former Smoker    Packs/day: 0.10    Years: 21.00    Types: Cigarettes     Quit date: 07/31/2016  . Smokeless tobacco: Never Used     Comment: a pack a week  . Alcohol use 0.0 oz/week     Comment: everyday     Allergies   Bee venom; Honey; Shellfish allergy; Ibuprofen; and Betadine [povidone iodine]   Review of Systems Review of Systems  Cardiovascular: Positive for chest pain.  Ten systems reviewed and are negative for acute change, except as noted in the HPI.     Physical Exam Updated Vital Signs BP 129/81 (BP Location: Right Arm)   Pulse 97   Temp 98.7 F (37.1 C) (Oral)   Resp 18   Ht 6\' 3"  (1.905 m)   Wt (!) 167.8 kg   SpO2 94%   BMI 46.25 kg/m   Physical Exam  Constitutional: He is oriented to person, place, and time. He appears well-developed and well-nourished. No distress.  Obese male. [place].  HENT:  Head: Normocephalic and atraumatic.  Eyes: Conjunctivae and EOM are normal. No scleral icterus.  Neck: Normal range of motion.  Cardiovascular: Normal rate, regular rhythm and intact distal pulses.   Pulmonary/Chest: Effort normal. No respiratory distress. He has no wheezes. He has no rales.  Lungs CTAB. Respirations even and unlabored  Musculoskeletal: Normal range of motion.  Neurological: He is alert and oriented to person, place, and time. He exhibits normal muscle tone. Coordination normal.  GCS 15. Patient moving all extremities.  Skin: Skin is warm and dry. No rash noted. He is not diaphoretic. No erythema. No pallor.  Psychiatric: He has a normal mood and affect. His behavior is normal.  Nursing note and vitals reviewed.    ED Treatments / Results  Labs (all labs ordered are listed, but only abnormal results are displayed) Labs Reviewed  BASIC METABOLIC PANEL - Abnormal; Notable for the following:       Result Value   Potassium 3.3 (*)    Glucose, Bld 116 (*)    Creatinine, Ser 1.44 (*)    GFR calc non Af Amer 60 (*)    All other components within normal limits  CBC - Abnormal; Notable for the following:    RBC  5.84 (*)    Hemoglobin 17.3 (*)    All other components within normal  limits  BRAIN NATRIURETIC PEPTIDE - Abnormal; Notable for the following:    B Natriuretic Peptide 104.9 (*)    All other components within normal limits  RAPID URINE DRUG SCREEN, HOSP PERFORMED - Abnormal; Notable for the following:    Tetrahydrocannabinol POSITIVE (*)    All other components within normal limits  I-STAT TROPOININ, ED  I-STAT TROPOININ, ED    EKG  EKG Interpretation  Date/Time:  Thursday December 02 2016 21:44:03 EDT Ventricular Rate:  96 PR Interval:  196 QRS Duration: 86 QT Interval:  404 QTC Calculation: 510 R Axis:   -59 Text Interpretation:  Normal sinus rhythm Possible Left atrial enlargement Pulmonary disease pattern Left anterior fascicular block Nonspecific T wave abnormality Prolonged QT Abnormal ECG When compared to prior, No significant changes.  No STEMI Confirmed by Sherry Ruffing MD, [place] [phone]) on 12/02/2016 11:47:32 PM       Radiology Dg Chest 2 View  Result Date: 12/02/2016 CLINICAL DATA:  Acute onset of generalized chest pain. Initial encounter. EXAM: CHEST  2 VIEW COMPARISON:  Chest radiograph performed 08/30/2016 FINDINGS: The lungs are well-aerated. Mild vascular congestion is noted. There is no evidence of focal opacification, pleural effusion or pneumothorax. The heart is normal in size; the mediastinal contour is within normal limits. No acute osseous abnormalities are seen. IMPRESSION: Mild vascular congestion noted.  Lungs remain grossly clear. Electronically Signed   By: Garald Balding M.D.   On: 12/02/2016 22:22    Procedures Procedures (including critical care time)  Medications Ordered in ED Medications - No data to display   Initial Impression / Assessment and Plan / ED Course  I have reviewed the triage vital signs and the nursing notes.  Pertinent labs & imaging results that were available during my care of the patient were reviewed by me and considered in  my medical decision making (see chart for details).     12:00 AM Patient presenting for chest pain. Story is concerning for potential underlying ACS; however, patient with history of reassuring cardiac catheterization in the past without significant CAD. Doubt PE given compliance with Xarelto and lack of tachycardia or hypoxia. No leg swelling, per patient. Also doubt dissection. No mediastinal widening or other acute cardiopulmonary abnormality on CXR. Mild vascular congestion is stable.  12:13 AM Case discussed with Dr. Aundra Dubin of cardiology including onset of pain, associated symptoms of SOB and diaphoresis, FHx, and recent reassuring echo. Dr. Aundra Dubin has reviewed the patient's old cardiac catheterization results from 2016. Dr. Aundra Dubin states that patient is appropriate for trended troponins and outpatient follow up if normal. Delta troponin to be drawn at 2AM.  2:36 AM Delta troponin negative. UDS negative for cocaine. Patient has had no worsening in his symptoms. VSS. Patient expresses comfort with discharge. Plan for outpatient cardiology follow up with return for new or worsening symptoms.   Final Clinical Impressions(s) / ED Diagnoses   Final diagnoses:  Chest pain, unspecified type  Hypokalemia    New Prescriptions New Prescriptions   No medications on file     Antonietta Breach, PA-C 12/03/16 [place], MD 12/03/16 [id number]

## 2016-12-02 NOTE — ED Triage Notes (Signed)
Per EMS, pt c/o chest pressure with no radiation beginning at 2000 tonight. Pt reports diaphoreses that resolved prior to EMS arrival, dyspnea on exertion, denies n/v. Pt reports having 4 12 oz. Beers tonight, given 324 aspirin and 1 NTG PTA. Rates pressure at 3/10.

## 2016-12-02 NOTE — ED Notes (Signed)
Patient transported to X-ray 

## 2016-12-03 LAB — I-STAT TROPONIN, ED: TROPONIN I, POC: 0.04 ng/mL (ref 0.00–0.08)

## 2016-12-03 LAB — RAPID URINE DRUG SCREEN, HOSP PERFORMED
Amphetamines: NOT DETECTED
BARBITURATES: NOT DETECTED
BENZODIAZEPINES: NOT DETECTED
Cocaine: NOT DETECTED
Opiates: NOT DETECTED
TETRAHYDROCANNABINOL: POSITIVE — AB

## 2016-12-03 MED ORDER — POTASSIUM CHLORIDE CRYS ER 20 MEQ PO TBCR
40.0000 meq | EXTENDED_RELEASE_TABLET | Freq: Once | ORAL | Status: AC
  Administered 2016-12-03: 40 meq via ORAL
  Filled 2016-12-03: qty 2

## 2016-12-03 NOTE — Discharge Instructions (Signed)
Follow-up with your cardiologist regarding your visit today. You may return to the emergency department, as needed, for new or concerning symptoms.

## 2016-12-04 NOTE — Addendum Note (Signed)
Encounter addended by: Jolaine Artist, MD on: 12/04/2016  9:43 PM<BR>    Actions taken: LOS modified, Sign clinical note

## 2016-12-06 ENCOUNTER — Other Ambulatory Visit (HOSPITAL_COMMUNITY): Payer: Self-pay

## 2016-12-06 NOTE — Progress Notes (Signed)
Paramedicine Encounter    Patient ID: Donald Brady, male    DOB: 08-Nov-1975, 41 y.o.   MRN: [id number]   Patient Care Team: Florinda Marker, MD as PCP - General  Patient Active Problem List   Diagnosis Date Noted  . Back pain   . Morbid obesity due to excess calories ([place])   . Presbyopia 01/26/2016  . Alcohol abuse 01/26/2016  . Hypertensive heart disease 10/01/2015  . Chronic systolic CHF (congestive heart failure) ([place]) 10/01/2015  . Umbilical hernia without obstruction and without gangrene 08/29/2015  . Pre-diabetes 08/29/2015  . CKD (chronic kidney disease), stage III 08/19/2015  . Nonobstructive cardiomyopathy ([place]) 05/05/2015  . Cocaine use 12/23/2014  . Depression 10/10/2014  . GERD (gastroesophageal reflux disease) 07/24/2014  . Polycythemia, secondary 05/23/2014  . Factor 5 Leiden mutation, heterozygous ([place]) 05/22/2014  . Hyperlipidemia 04/23/2014  . Sinusitis, chronic 04/23/2014  . Severe obstructive sleep apnea 11/20/2013  . Essential hypertension, benign 08/04/2013  . Asthma 02/20/2013  . Osteoarthritis of left knee 06/28/2012  . Long term current use of anticoagulant therapy 12/02/2011  . Bilateral pulmonary embolism ([place]) 11/27/2011  . Tobacco abuse 11/27/2011    Current Outpatient Prescriptions:  .  atorvastatin (LIPITOR) 40 MG tablet, TAKE 1 Tablet BY MOUTH ONCE DAILY, Disp: 90 tablet, Rfl: 0 .  buPROPion (WELLBUTRIN XL) 300 MG 24 hr tablet, Take 1 tablet (300 mg total) by mouth daily. Dose increase, please d/c previous prescription for bupropion. IM $4 program, Disp: 30 tablet, Rfl: 1 .  carvedilol (COREG) 12.5 MG tablet, Take 1 tablet (12.5 mg total) by mouth 2 (two) times daily., Disp: 180 tablet, Rfl: 3 .  digoxin (LANOXIN) 0.125 MG tablet, Take 1 tablet (0.125 mg total) by mouth daily., Disp: 30 tablet, Rfl: 6 .  folic acid (FOLVITE) 1 MG tablet, Take 1 tablet (1 mg total) by mouth daily., Disp: 90 tablet, Rfl: 3 .  furosemide (LASIX) 40 MG tablet, Take 80  mg by mouth 2 (two) times daily., Disp: , Rfl:  .  hydrALAZINE (APRESOLINE) 50 MG tablet, Take 1 tablet (50 mg total) by mouth 3 (three) times daily., Disp: 90 tablet, Rfl: 6 .  isosorbide mononitrate (IMDUR) 30 MG 24 hr tablet, Take 1 tablet (30 mg total) by mouth 2 (two) times daily., Disp: 60 tablet, Rfl: 6 .  metolazone (ZAROXOLYN) 2.5 MG tablet, Take 1 tablet (2.5 mg total) by mouth 3 (three) times a week. Tuesday, Thursday, Saturday, Disp: 15 tablet, Rfl: 2 .  mometasone-formoterol (DULERA) 200-5 MCG/ACT AERO, Inhale 2 puffs into the lungs 2 (two) times daily., Disp: 8.8 g, Rfl: 3 .  Multiple Vitamin (MULTIVITAMIN) tablet, Take 1 tablet by mouth daily., Disp: , Rfl:  .  nicotine (NICOTROL) 10 MG inhaler, Inhale 1 Cartridge (1 continuous puffing total) into the lungs as needed for smoking cessation. Use at least 6 cartridges daily x 3-4 weeks, Disp: 504 each, Rfl: 2 .  potassium chloride SA (K-DUR,KLOR-CON) 20 MEQ tablet, Take 40 meq (2 tabs) by mouth daily .Take an add'l 20 mEq (1 tablet) when take metolazone, Disp: 90 tablet, Rfl: 0 .  PROVENTIL HFA 108 (90 Base) MCG/ACT inhaler, INHALE 2 PUFFS BY MOUTH EVERY 6 HOURS AS NEEDED FOR COUGHING, WHEEZING, OR SHORTNESS OF BREATH, Disp: 18 g, Rfl: 6 .  rivaroxaban (XARELTO) 20 MG TABS tablet, Take 1 tablet (20 mg total) by mouth daily with supper., Disp: 90 tablet, Rfl: 3 .  spironolactone (ALDACTONE) 25 MG tablet, Take 1 tablet (25 mg  total) by mouth daily., Disp: 30 tablet, Rfl: 6 .  venlafaxine XR (EFFEXOR XR) 37.5 MG 24 hr capsule, Take 1 capsule (37.5 mg total) by mouth daily with breakfast. IM program, [facility], Disp: 30 capsule, Rfl: 0 .  nicotine (NICODERM CQ - DOSED IN MG/24 HOURS) 14 mg/24hr patch, Place 1 patch (14 mg total) onto the skin daily. (Patient not taking: Reported on 11/26/2016), Disp: 28 patch, Rfl: 2 Allergies  Allergen Reactions  . Bee Venom Anaphylaxis  . Honey Anaphylaxis    RAW HONEY  . Shellfish Allergy Shortness Of  Breath and Swelling  . Ibuprofen Hives and Swelling  . Betadine [Povidone Iodine]     BECAUSE OF SHELLFISH ALLERGY     Social History   Social History  . Marital status: Single    Spouse name: N/A  . Number of children: 1  . Years of education: N/A   Occupational History  . Disabled- Architect   .  Other   Social History Main Topics  . Smoking status: Former Smoker    Packs/day: 0.10    Years: 21.00    Types: Cigarettes    Quit date: 07/31/2016  . Smokeless tobacco: Never Used     Comment: a pack a week  . Alcohol use 0.0 oz/week     Comment: everyday  . Drug use: Yes     Comment: Marijuana.  . Sexual activity: Not on file   Other Topics Concern  . Not on file   Social History Narrative   Lives in [place] with his dog.  Has a 29 year old daughter that he sees daily.  Has good relationship with daughter's mom.  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     Physical Exam  Constitutional: He is oriented to person, place, and time. He appears well-developed.  Eyes: Pupils are equal, round, and reactive to light.  Neck: Normal range of motion. Neck supple.  Pulmonary/Chest: Effort normal and breath sounds normal. No respiratory distress. He has no wheezes. He has no rales.  Abdominal: Soft. He exhibits no distension. There is no tenderness. There is no rebound.  Musculoskeletal: Normal range of motion. He exhibits edema.  Neurological: He is alert and oriented to person, place, and time.  Skin: Skin is warm and dry.        Future Appointments Date Time Provider [place]  12/15/2016 9:00 AM [facility] PA/NP [facility] None  02/09/2017 3:45 PM Alexa Angela Burke, MD [facility] [place]   BP (!) 136/100 (BP Location: Right Arm, Patient Position: Sitting, Cuff Size: Large)   Pulse (!) 102   Resp 16   Wt (!) 371 lb (168.3 kg)   SpO2 97%   BMI 46.37 kg/m  Weight yesterday 369 lbs Last visit weight-370 lbs CBG- 128 mg/dl  Donald Brady was seen  at home today and reports feeling anxious due to having home renovtions done today. He is complying with his medications but remains hypertensive. He stated that he had two beers this morning before the construction workers showed up to try and combat his anxiety. He also reports eating out all weekend and not limiting his sodium intake. His medications were verified and his pillbox was refilled. He stated he was unable to fill his box because of his nerves. He needs a refill of metolazone and Effexor but has enough to last him this week. He also says he is still smoking but has not had an alcoholic drink in  over a week until today.  Jacquiline Doe, EMT 12/06/16  ACTION: Home visit completed Next visit planned for 1 week

## 2016-12-11 ENCOUNTER — Inpatient Hospital Stay (HOSPITAL_COMMUNITY): Payer: Medicaid Other

## 2016-12-11 ENCOUNTER — Inpatient Hospital Stay (HOSPITAL_COMMUNITY)
Admission: EM | Admit: 2016-12-11 | Discharge: 2016-12-14 | DRG: 917 | Disposition: E | Payer: Medicaid Other | Attending: Internal Medicine | Admitting: Internal Medicine

## 2016-12-11 ENCOUNTER — Encounter (HOSPITAL_COMMUNITY): Payer: Self-pay

## 2016-12-11 DIAGNOSIS — G92 Toxic encephalopathy: Secondary | ICD-10-CM | POA: Diagnosis present

## 2016-12-11 DIAGNOSIS — Z915 Personal history of self-harm: Secondary | ICD-10-CM

## 2016-12-11 DIAGNOSIS — N179 Acute kidney failure, unspecified: Secondary | ICD-10-CM | POA: Diagnosis present

## 2016-12-11 DIAGNOSIS — I428 Other cardiomyopathies: Secondary | ICD-10-CM

## 2016-12-11 DIAGNOSIS — E872 Acidosis, unspecified: Secondary | ICD-10-CM

## 2016-12-11 DIAGNOSIS — Z91030 Bee allergy status: Secondary | ICD-10-CM

## 2016-12-11 DIAGNOSIS — T447X1A Poisoning by beta-adrenoreceptor antagonists, accidental (unintentional), initial encounter: Secondary | ICD-10-CM | POA: Diagnosis present

## 2016-12-11 DIAGNOSIS — J9601 Acute respiratory failure with hypoxia: Secondary | ICD-10-CM | POA: Diagnosis not present

## 2016-12-11 DIAGNOSIS — N289 Disorder of kidney and ureter, unspecified: Secondary | ICD-10-CM

## 2016-12-11 DIAGNOSIS — R402362 Coma scale, best motor response, obeys commands, at arrival to emergency department: Secondary | ICD-10-CM | POA: Diagnosis present

## 2016-12-11 DIAGNOSIS — N183 Chronic kidney disease, stage 3 (moderate): Secondary | ICD-10-CM | POA: Diagnosis present

## 2016-12-11 DIAGNOSIS — I2699 Other pulmonary embolism without acute cor pulmonale: Secondary | ICD-10-CM | POA: Diagnosis present

## 2016-12-11 DIAGNOSIS — I2510 Atherosclerotic heart disease of native coronary artery without angina pectoris: Secondary | ICD-10-CM | POA: Diagnosis present

## 2016-12-11 DIAGNOSIS — Z720 Tobacco use: Secondary | ICD-10-CM | POA: Diagnosis not present

## 2016-12-11 DIAGNOSIS — F1490 Cocaine use, unspecified, uncomplicated: Secondary | ICD-10-CM | POA: Diagnosis not present

## 2016-12-11 DIAGNOSIS — Z91048 Other nonmedicinal substance allergy status: Secondary | ICD-10-CM

## 2016-12-11 DIAGNOSIS — J810 Acute pulmonary edema: Secondary | ICD-10-CM | POA: Diagnosis present

## 2016-12-11 DIAGNOSIS — D6851 Activated protein C resistance: Secondary | ICD-10-CM | POA: Diagnosis present

## 2016-12-11 DIAGNOSIS — I469 Cardiac arrest, cause unspecified: Secondary | ICD-10-CM

## 2016-12-11 DIAGNOSIS — J45909 Unspecified asthma, uncomplicated: Secondary | ICD-10-CM | POA: Diagnosis present

## 2016-12-11 DIAGNOSIS — I130 Hypertensive heart and chronic kidney disease with heart failure and stage 1 through stage 4 chronic kidney disease, or unspecified chronic kidney disease: Secondary | ICD-10-CM | POA: Diagnosis present

## 2016-12-11 DIAGNOSIS — Z7901 Long term (current) use of anticoagulants: Secondary | ICD-10-CM

## 2016-12-11 DIAGNOSIS — N189 Chronic kidney disease, unspecified: Secondary | ICD-10-CM

## 2016-12-11 DIAGNOSIS — Z886 Allergy status to analgesic agent status: Secondary | ICD-10-CM | POA: Diagnosis not present

## 2016-12-11 DIAGNOSIS — K219 Gastro-esophageal reflux disease without esophagitis: Secondary | ICD-10-CM | POA: Diagnosis present

## 2016-12-11 DIAGNOSIS — G4733 Obstructive sleep apnea (adult) (pediatric): Secondary | ICD-10-CM | POA: Diagnosis present

## 2016-12-11 DIAGNOSIS — E875 Hyperkalemia: Secondary | ICD-10-CM | POA: Diagnosis present

## 2016-12-11 DIAGNOSIS — T50904A Poisoning by unspecified drugs, medicaments and biological substances, undetermined, initial encounter: Secondary | ICD-10-CM

## 2016-12-11 DIAGNOSIS — Z7189 Other specified counseling: Secondary | ICD-10-CM | POA: Diagnosis not present

## 2016-12-11 DIAGNOSIS — F329 Major depressive disorder, single episode, unspecified: Secondary | ICD-10-CM | POA: Diagnosis present

## 2016-12-11 DIAGNOSIS — Z91013 Allergy to seafood: Secondary | ICD-10-CM

## 2016-12-11 DIAGNOSIS — R1110 Vomiting, unspecified: Secondary | ICD-10-CM | POA: Diagnosis present

## 2016-12-11 DIAGNOSIS — F919 Conduct disorder, unspecified: Secondary | ICD-10-CM | POA: Diagnosis present

## 2016-12-11 DIAGNOSIS — Z79899 Other long term (current) drug therapy: Secondary | ICD-10-CM

## 2016-12-11 DIAGNOSIS — Z91018 Allergy to other foods: Secondary | ICD-10-CM

## 2016-12-11 DIAGNOSIS — G9340 Encephalopathy, unspecified: Secondary | ICD-10-CM | POA: Diagnosis not present

## 2016-12-11 DIAGNOSIS — T43211A Poisoning by selective serotonin and norepinephrine reuptake inhibitors, accidental (unintentional), initial encounter: Principal | ICD-10-CM | POA: Diagnosis present

## 2016-12-11 DIAGNOSIS — T43291A Poisoning by other antidepressants, accidental (unintentional), initial encounter: Secondary | ICD-10-CM | POA: Diagnosis present

## 2016-12-11 DIAGNOSIS — R402252 Coma scale, best verbal response, oriented, at arrival to emergency department: Secondary | ICD-10-CM | POA: Diagnosis present

## 2016-12-11 DIAGNOSIS — Z7951 Long term (current) use of inhaled steroids: Secondary | ICD-10-CM

## 2016-12-11 DIAGNOSIS — R7303 Prediabetes: Secondary | ICD-10-CM | POA: Diagnosis present

## 2016-12-11 DIAGNOSIS — G312 Degeneration of nervous system due to alcohol: Secondary | ICD-10-CM | POA: Diagnosis present

## 2016-12-11 DIAGNOSIS — I5022 Chronic systolic (congestive) heart failure: Secondary | ICD-10-CM | POA: Diagnosis present

## 2016-12-11 DIAGNOSIS — Z6841 Body Mass Index (BMI) 40.0 and over, adult: Secondary | ICD-10-CM | POA: Diagnosis not present

## 2016-12-11 DIAGNOSIS — R570 Cardiogenic shock: Secondary | ICD-10-CM | POA: Diagnosis present

## 2016-12-11 DIAGNOSIS — Z8249 Family history of ischemic heart disease and other diseases of the circulatory system: Secondary | ICD-10-CM

## 2016-12-11 DIAGNOSIS — R402142 Coma scale, eyes open, spontaneous, at arrival to emergency department: Secondary | ICD-10-CM | POA: Diagnosis present

## 2016-12-11 DIAGNOSIS — F10129 Alcohol abuse with intoxication, unspecified: Secondary | ICD-10-CM | POA: Diagnosis present

## 2016-12-11 DIAGNOSIS — T50901A Poisoning by unspecified drugs, medicaments and biological substances, accidental (unintentional), initial encounter: Secondary | ICD-10-CM | POA: Diagnosis not present

## 2016-12-11 DIAGNOSIS — R0600 Dyspnea, unspecified: Secondary | ICD-10-CM

## 2016-12-11 DIAGNOSIS — E785 Hyperlipidemia, unspecified: Secondary | ICD-10-CM | POA: Diagnosis present

## 2016-12-11 DIAGNOSIS — F1010 Alcohol abuse, uncomplicated: Secondary | ICD-10-CM | POA: Diagnosis present

## 2016-12-11 DIAGNOSIS — F32A Depression, unspecified: Secondary | ICD-10-CM | POA: Diagnosis present

## 2016-12-11 DIAGNOSIS — Z978 Presence of other specified devices: Secondary | ICD-10-CM

## 2016-12-11 DIAGNOSIS — E878 Other disorders of electrolyte and fluid balance, not elsewhere classified: Secondary | ICD-10-CM

## 2016-12-11 DIAGNOSIS — T45511A Poisoning by anticoagulants, accidental (unintentional), initial encounter: Secondary | ICD-10-CM | POA: Diagnosis present

## 2016-12-11 LAB — CBC
HCT: 47.7 % (ref 39.0–52.0)
HCT: 49.9 % (ref 39.0–52.0)
Hemoglobin: 16.2 g/dL (ref 13.0–17.0)
Hemoglobin: 16.8 g/dL (ref 13.0–17.0)
MCH: 29.7 pg (ref 26.0–34.0)
MCH: 29.6 pg (ref 26.0–34.0)
MCHC: 34 g/dL (ref 30.0–36.0)
MCHC: 33.7 g/dL (ref 30.0–36.0)
MCV: 87.5 fL (ref 78.0–100.0)
MCV: 88 fL (ref 78.0–100.0)
PLATELETS: 248 10*3/uL (ref 150–400)
Platelets: 130 10*3/uL — ABNORMAL LOW (ref 150–400)
RBC: 5.45 MIL/uL (ref 4.22–5.81)
RBC: 5.67 MIL/uL (ref 4.22–5.81)
RDW: 15.5 % (ref 11.5–15.5)
RDW: 15.7 % — ABNORMAL HIGH (ref 11.5–15.5)
WBC: 8.9 10*3/uL (ref 4.0–10.5)
WBC: 11.2 10*3/uL — ABNORMAL HIGH (ref 4.0–10.5)

## 2016-12-11 LAB — POCT I-STAT 3, ART BLOOD GAS (G3+)
ACID-BASE DEFICIT: 10 mmol/L — AB (ref 0.0–2.0)
Acid-base deficit: 6 mmol/L — ABNORMAL HIGH (ref 0.0–2.0)
BICARBONATE: 16.2 mmol/L — AB (ref 20.0–28.0)
BICARBONATE: 26.6 mmol/L (ref 20.0–28.0)
O2 SAT: 81 %
O2 Saturation: 25 %
PCO2 ART: 84.5 mmHg — AB (ref 32.0–48.0)
PO2 ART: 52 mmHg — AB (ref 83.0–108.0)
Patient temperature: 98.3
Patient temperature: 98.6
TCO2: 17 mmol/L (ref 0–100)
TCO2: 29 mmol/L (ref 0–100)
pCO2 arterial: 36.4 mmHg (ref 32.0–48.0)
pH, Arterial: 7.255 — ABNORMAL LOW (ref 7.350–7.450)
pH, Arterial: 7.106 — CL (ref 7.350–7.450)
pO2, Arterial: 24 mmHg — CL (ref 83.0–108.0)

## 2016-12-11 LAB — CBG MONITORING, ED: GLUCOSE-CAPILLARY: 142 mg/dL — AB (ref 65–99)

## 2016-12-11 LAB — DIGOXIN LEVEL: Digoxin Level: <0.2 ng/mL — ABNORMAL LOW (ref 0.8–2.0)

## 2016-12-11 LAB — MAGNESIUM
Magnesium: 2 mg/dL (ref 1.7–2.4)
Magnesium: 2 mg/dL (ref 1.7–2.4)

## 2016-12-11 LAB — PROCALCITONIN: Procalcitonin: <0.1 ng/mL

## 2016-12-11 LAB — ETHANOL: ALCOHOL ETHYL (B): 86 mg/dL — AB (ref <5)

## 2016-12-11 LAB — BASIC METABOLIC PANEL
Anion gap: 9 (ref 5–15)
BUN: 14 mg/dL (ref 6–20)
CO2: 19 mmol/L — ABNORMAL LOW (ref 22–32)
Calcium: 8.2 mg/dL — ABNORMAL LOW (ref 8.9–10.3)
Chloride: 106 mmol/L (ref 101–111)
Creatinine, Ser: 2.71 mg/dL — ABNORMAL HIGH (ref 0.61–1.24)
GFR calc Af Amer: 32 mL/min — ABNORMAL LOW (ref >60)
GFR calc non Af Amer: 28 mL/min — ABNORMAL LOW (ref >60)
Glucose, Bld: 148 mg/dL — ABNORMAL HIGH (ref 65–99)
Potassium: 5.5 mmol/L — ABNORMAL HIGH (ref 3.5–5.1)
Sodium: 134 mmol/L — ABNORMAL LOW (ref 135–145)

## 2016-12-11 LAB — COMPREHENSIVE METABOLIC PANEL
ALK PHOS: 63 U/L (ref 38–126)
ALK PHOS: 62 U/L (ref 38–126)
ALT: 35 U/L (ref 17–63)
ALT: 43 U/L (ref 17–63)
AST: 28 U/L (ref 15–41)
AST: 49 U/L — ABNORMAL HIGH (ref 15–41)
Albumin: 3.3 g/dL — ABNORMAL LOW (ref 3.5–5.0)
Albumin: 3.4 g/dL — ABNORMAL LOW (ref 3.5–5.0)
Anion gap: 12 (ref 5–15)
Anion gap: 9 (ref 5–15)
BILIRUBIN TOTAL: 0.7 mg/dL (ref 0.3–1.2)
BUN: 12 mg/dL (ref 6–20)
BUN: 12 mg/dL (ref 6–20)
CALCIUM: 8.4 mg/dL — AB (ref 8.9–10.3)
CO2: 19 mmol/L — ABNORMAL LOW (ref 22–32)
CO2: 16 mmol/L — ABNORMAL LOW (ref 22–32)
CREATININE: 2.47 mg/dL — AB (ref 0.61–1.24)
Calcium: 8 mg/dL — ABNORMAL LOW (ref 8.9–10.3)
Chloride: 102 mmol/L (ref 101–111)
Chloride: 108 mmol/L (ref 101–111)
Creatinine, Ser: 1.67 mg/dL — ABNORMAL HIGH (ref 0.61–1.24)
GFR calc Af Amer: 58 mL/min — ABNORMAL LOW (ref >60)
GFR calc Af Amer: 36 mL/min — ABNORMAL LOW (ref >60)
GFR calc non Af Amer: 31 mL/min — ABNORMAL LOW (ref >60)
GFR, EST NON AFRICAN AMERICAN: 50 mL/min — AB (ref >60)
GLUCOSE: 177 mg/dL — AB (ref 65–99)
Glucose, Bld: 142 mg/dL — ABNORMAL HIGH (ref 65–99)
POTASSIUM: 3.4 mmol/L — AB (ref 3.5–5.1)
POTASSIUM: 5.6 mmol/L — AB (ref 3.5–5.1)
Sodium: 133 mmol/L — ABNORMAL LOW (ref 135–145)
Sodium: 133 mmol/L — ABNORMAL LOW (ref 135–145)
TOTAL PROTEIN: 6.7 g/dL (ref 6.5–8.1)
TOTAL PROTEIN: 6.7 g/dL (ref 6.5–8.1)
Total Bilirubin: 1.1 mg/dL (ref 0.3–1.2)

## 2016-12-11 LAB — RAPID URINE DRUG SCREEN, HOSP PERFORMED
Amphetamines: NOT DETECTED
BARBITURATES: NOT DETECTED
Benzodiazepines: NOT DETECTED
COCAINE: NOT DETECTED
OPIATES: NOT DETECTED
TETRAHYDROCANNABINOL: POSITIVE — AB

## 2016-12-11 LAB — PROTIME-INR
INR: 1.08
INR: 1.21
Prothrombin Time: 14 seconds (ref 11.4–15.2)
Prothrombin Time: 15.4 seconds — ABNORMAL HIGH (ref 11.4–15.2)

## 2016-12-11 LAB — LACTIC ACID, PLASMA: Lactic Acid, Venous: 3 mmol/L (ref 0.5–1.9)

## 2016-12-11 LAB — ACETAMINOPHEN LEVEL: Acetaminophen (Tylenol), Serum: <10 ug/mL — ABNORMAL LOW (ref 10–30)

## 2016-12-11 LAB — PHOSPHORUS: Phosphorus: 4.5 mg/dL (ref 2.5–4.6)

## 2016-12-11 LAB — LIPASE, BLOOD: LIPASE: 19 U/L (ref 11–51)

## 2016-12-11 LAB — SALICYLATE LEVEL: Salicylate Lvl: <7 mg/dL (ref 2.8–30.0)

## 2016-12-11 LAB — HEPARIN LEVEL (UNFRACTIONATED)

## 2016-12-11 LAB — MRSA PCR SCREENING: MRSA BY PCR: NEGATIVE

## 2016-12-11 LAB — APTT: aPTT: 31 seconds (ref 24–36)

## 2016-12-11 MED ORDER — POTASSIUM CHLORIDE CRYS ER 20 MEQ PO TBCR
40.0000 meq | EXTENDED_RELEASE_TABLET | Freq: Once | ORAL | Status: DC
  Filled 2016-12-11: qty 2

## 2016-12-11 MED ORDER — SODIUM BICARBONATE 8.4 % IV SOLN
INTRAVENOUS | Status: DC
  Filled 2016-12-11 (×2): qty 150

## 2016-12-11 MED ORDER — DEXTROSE 5 % IV SOLN
0.0000 ug/min | INTRAVENOUS | Status: DC
  Filled 2016-12-11: qty 4

## 2016-12-11 MED ORDER — ONDANSETRON HCL 4 MG/2ML IJ SOLN: 4.0000 mg | Freq: Three times a day (TID) | INTRAMUSCULAR | Status: DC | PRN

## 2016-12-11 MED ORDER — SENNOSIDES-DOCUSATE SODIUM 8.6-50 MG PO TABS: 1.0000 | ORAL_TABLET | Freq: Every evening | ORAL | Status: DC | PRN

## 2016-12-11 MED ORDER — LORAZEPAM 2 MG/ML IJ SOLN: 2.0000 mg | INTRAMUSCULAR | Status: DC | PRN

## 2016-12-11 MED ORDER — SODIUM CHLORIDE 0.9 % IV SOLN
0.4000 ug/kg/h | INTRAVENOUS | Status: DC
  Administered 2016-12-11: 0.5 ug/kg/h via INTRAVENOUS
  Administered 2016-12-11: 1.2 ug/kg/h via INTRAVENOUS
  Filled 2016-12-11 (×2): qty 4

## 2016-12-11 MED ORDER — POTASSIUM CHLORIDE IN NACL 20-0.9 MEQ/L-% IV SOLN
Freq: Once | INTRAVENOUS | Status: AC
  Administered 2016-12-11: 05:00:00 via INTRAVENOUS
  Filled 2016-12-11: qty 1000

## 2016-12-11 MED ORDER — MIDAZOLAM HCL 2 MG/2ML IJ SOLN
INTRAMUSCULAR | Status: AC
  Filled 2016-12-11: qty 4

## 2016-12-11 MED ORDER — HEPARIN SODIUM (PORCINE) 5000 UNIT/ML IJ SOLN: 5000.0000 [IU] | Freq: Three times a day (TID) | INTRAMUSCULAR | Status: DC

## 2016-12-11 MED ORDER — FOLIC ACID 5 MG/ML IJ SOLN
1.0000 mg | Freq: Every day | INTRAMUSCULAR | Status: DC
  Administered 2016-12-11: 1 mg via INTRAVENOUS
  Filled 2016-12-11: qty 0.2

## 2016-12-11 MED ORDER — PANTOPRAZOLE SODIUM 40 MG IV SOLR
40.0000 mg | INTRAVENOUS | Status: DC
  Administered 2016-12-11: 40 mg via INTRAVENOUS
  Filled 2016-12-11: qty 40

## 2016-12-11 MED ORDER — SODIUM CHLORIDE 0.9 % IV SOLN
8.0000 mg | Freq: Three times a day (TID) | INTRAVENOUS | Status: DC | PRN
  Filled 2016-12-11: qty 4

## 2016-12-11 MED ORDER — MIDAZOLAM HCL 2 MG/2ML IJ SOLN
INTRAMUSCULAR | Status: AC
  Filled 2016-12-11: qty 2

## 2016-12-11 MED ORDER — EPINEPHRINE PF 1 MG/10ML IJ SOSY
PREFILLED_SYRINGE | INTRAMUSCULAR | Status: AC
  Filled 2016-12-11: qty 10

## 2016-12-11 MED ORDER — FUROSEMIDE 10 MG/ML IJ SOLN
40.0000 mg | Freq: Once | INTRAMUSCULAR | Status: AC
  Administered 2016-12-11: 40 mg via INTRAVENOUS
  Filled 2016-12-11: qty 4

## 2016-12-11 MED ORDER — NICOTINE 21 MG/24HR TD PT24
21.0000 mg | MEDICATED_PATCH | TRANSDERMAL | Status: DC
  Administered 2016-12-11: 21 mg via TRANSDERMAL
  Filled 2016-12-11: qty 1

## 2016-12-11 MED ORDER — SODIUM BICARBONATE 8.4 % IV SOLN
INTRAVENOUS | Status: AC
  Administered 2016-12-11: 50 meq
  Filled 2016-12-11: qty 50

## 2016-12-11 MED ORDER — FENTANYL CITRATE (PF) 100 MCG/2ML IJ SOLN: 100.0000 ug | Freq: Once | INTRAMUSCULAR | Status: DC

## 2016-12-11 MED ORDER — SODIUM CHLORIDE 0.9 % IV BOLUS (SEPSIS)
1000.0000 mL | Freq: Once | INTRAVENOUS | Status: AC
  Administered 2016-12-11: 1000 mL via INTRAVENOUS

## 2016-12-11 MED ORDER — SODIUM POLYSTYRENE SULFONATE 15 GM/60ML PO SUSP: 30.0000 g | Freq: Once | ORAL | Status: DC

## 2016-12-11 MED ORDER — MIDAZOLAM HCL 2 MG/2ML IJ SOLN
2.0000 mg | Freq: Once | INTRAMUSCULAR | Status: AC
  Administered 2016-12-11: 2 mg via INTRAVENOUS

## 2016-12-11 MED ORDER — SODIUM BICARBONATE 8.4 % IV SOLN
INTRAVENOUS | Status: AC
  Filled 2016-12-11: qty 50

## 2016-12-11 MED ORDER — ONDANSETRON HCL 4 MG/2ML IJ SOLN
4.0000 mg | Freq: Once | INTRAMUSCULAR | Status: AC
  Administered 2016-12-11: 4 mg via INTRAVENOUS
  Filled 2016-12-11: qty 2

## 2016-12-11 MED ORDER — CALCIUM GLUCONATE 10 % IV SOLN
1.0000 g | Freq: Once | INTRAVENOUS | Status: AC
  Administered 2016-12-11: 1 g via INTRAVENOUS
  Filled 2016-12-11: qty 10

## 2016-12-11 MED ORDER — CALCIUM CHLORIDE 10 % IV SOLN
INTRAVENOUS | Status: AC
  Filled 2016-12-11: qty 10

## 2016-12-11 MED ORDER — SODIUM CHLORIDE 0.9 % IV SOLN
INTRAVENOUS | Status: DC
  Administered 2016-12-11: 09:00:00 via INTRAVENOUS

## 2016-12-11 MED ORDER — ROCURONIUM BROMIDE 50 MG/5ML IV SOLN
100.0000 mg | Freq: Once | INTRAVENOUS | Status: AC
Start: 2016-12-11 — End: 2016-12-11
  Administered 2016-12-11: 100 mg via INTRAVENOUS
  Filled 2016-12-11: qty 10

## 2016-12-11 MED ORDER — ETOMIDATE 2 MG/ML IV SOLN
20.0000 mg | Freq: Once | INTRAVENOUS | Status: AC
  Administered 2016-12-11: 20 mg via INTRAVENOUS

## 2016-12-11 MED ORDER — EPINEPHRINE PF 1 MG/ML IJ SOLN
0.5000 ug/min | INTRAVENOUS | Status: DC
  Filled 2016-12-11: qty 4

## 2016-12-11 MED ORDER — FENTANYL CITRATE (PF) 100 MCG/2ML IJ SOLN
INTRAMUSCULAR | Status: AC
  Filled 2016-12-11: qty 2

## 2016-12-11 MED ORDER — ACETAMINOPHEN 650 MG RE SUPP: 650.0000 mg | Freq: Four times a day (QID) | RECTAL | Status: DC | PRN

## 2016-12-11 MED ORDER — HEPARIN (PORCINE) IN NACL 100-0.45 UNIT/ML-% IJ SOLN: 1900.0000 [IU]/h | INTRAMUSCULAR | Status: DC

## 2016-12-11 MED ORDER — SODIUM CHLORIDE 0.9 % IV SOLN
0.4000 ug/kg/h | INTRAVENOUS | Status: DC
  Filled 2016-12-11: qty 2

## 2016-12-11 MED ORDER — DEXTROSE 50 % IV SOLN
INTRAVENOUS | Status: AC
  Filled 2016-12-11: qty 50

## 2016-12-11 MED ORDER — ACETAMINOPHEN 325 MG PO TABS: 650.0000 mg | ORAL_TABLET | Freq: Four times a day (QID) | ORAL | Status: DC | PRN

## 2016-12-11 MED ORDER — BUDESONIDE 0.25 MG/2ML IN SUSP: 0.2500 mg | Freq: Two times a day (BID) | RESPIRATORY_TRACT | Status: DC

## 2016-12-11 MED ORDER — SODIUM CHLORIDE 0.9% FLUSH
3.0000 mL | Freq: Two times a day (BID) | INTRAVENOUS | Status: DC
  Administered 2016-12-11: 3 mL via INTRAVENOUS

## 2016-12-11 MED ORDER — THIAMINE HCL 100 MG/ML IJ SOLN
100.0000 mg | Freq: Every day | INTRAMUSCULAR | Status: DC
  Administered 2016-12-11: 100 mg via INTRAVENOUS
  Filled 2016-12-11: qty 2

## 2016-12-11 MED ORDER — IPRATROPIUM-ALBUTEROL 0.5-2.5 (3) MG/3ML IN SOLN
3.0000 mL | Freq: Four times a day (QID) | RESPIRATORY_TRACT | Status: DC
  Administered 2016-12-11: 3 mL via RESPIRATORY_TRACT
  Filled 2016-12-11: qty 3

## 2016-12-14 NOTE — Assessment & Plan Note (Signed)
Prior suicide attempot Current OD in setting of anger  Plan Sitter at bedside Will need pscyh consult

## 2016-12-14 NOTE — Progress Notes (Signed)
Md paged to make aware spO2 78% on RA. 85% on 4L [place]. Lungs coarse/diminished.

## 2016-12-14 NOTE — Procedures (Addendum)
   PATIENT NAME: Donald Brady MEDICAL RECORD NUMBER: [id number] Birthday: 1975/12/01  Age: 41 y.o. Admit Date: 12/25/16  Provider: 17:02 to 17:09  Indication: cardiac arrest, PEA  Technical Description:   CPR performance duration: 7 min  Was defibrillation or cardioversion used ? no  Was external pacer placed ? no  Was patient intubated pre/post CPR ? CPR developed immediate post intubation  Was transvenous pacer placed ? no  Medications Administered Include      Yes/no Amiodarone no  Atropin no  Calcium yes  Epinephrine Yes x 3  Lidocaine no  Magnesium no  Norepinephrine no  Phenylephrine no  Sodium bicarbonate Yes   Vasopression no  Insulin/glucose yes   Evaluation  Final Status - Was patient successfully resuscitated ? YES  If successfully resuscitated - what is current rhythm ? SINUS If successfully resuscitated - what is current hemodynamic status ? Shock on levophed  Miscellaneous Information Mom needs to be updated Await clearing of rocuronium around 19.00h and assess for induced hypothermia Needs cvl and aline    Dr. Brand Males, M.D., [facility].C.P Pulmonary and Critical Care Medicine Staff Physician [place] [facility] Pager: [phone], If no answer or between  15:00h - 7:00h: call [phone]  2016-12-25 5:32 PM

## 2016-12-14 NOTE — ED Notes (Signed)
Please call fiance' Marshia Ly for any changes [phone]

## 2016-12-14 NOTE — Assessment & Plan Note (Addendum)
Hx of PE and on xaretrlko chronic; ? complance. Unclear if he OD'ed on xarelto but probably not. INR normal here. CXR clear  Plan IV heparing gtt given acute hypoxemnic resp failure in setting of clear cxr Check Facotr 10A level, PT, PTT

## 2016-12-14 NOTE — Op Note (Signed)
PATIENT NAME: Donald Brady MEDICAL RECORD NUMBER: [id number] Birthday: 08-18-1975  Age: 41 y.o. Admit Date: 12/19/16  Provider: Brand Males , Eric Form and DR [facility] assist from the eICU  Indication: PEA and then asystole  Technical Description:   CPR performance duration: on and off from 17.02 to 18.25   Was defibrillation or cardioversion used ? no  Was external pacer placed ? no  Was patient intubated pre/post CPR ? pre  Was transvenous pacer placed ? no  Medications Administered Include      Yes/no Amiodarone no  Atropin no  Calcium yes  Epinephrine Yes several including gtt  Lidocaine no  Magnesium yes  Norepinephrine Yes gtt  Phenylephrine no  Sodium bicarbonate several  Vasopression 40 U x 1   Evaluation  Final Status - Was patient successfully resuscitated ? no  If successfully resuscitated - what is current rhythm ? NA If successfully resuscitated - what is current hemodynamic status ? NO  Miscellaneous Information Declared Dead. Mom updated at bedside - she is hysterically upset    Dr. Brand Males, M.D., [facility].C.P Pulmonary and Critical Care Medicine Staff Physician [place] [facility] Pager: [phone], If no answer or between  15:00h - 7:00h: call [phone]  2016-12-19 6:32 PM

## 2016-12-14 NOTE — Assessment & Plan Note (Addendum)
Unclear how much of what at admit. So far not exhibiting bp/hr or EKG issues or metabolic.  HAs confusion though/. INitialy said he took OD of xarelto but bottle shows he probably was not taking xaretlo. EKG 2016/12/31 pm with normal QTc and QRs but at upper limit normal  Plan Close ICU monitoring Check EKG again 12/12/16  Hold off hydration due to acute resp hypoxemic failure Check Facotr 10A level, PT, PTT

## 2016-12-14 NOTE — Progress Notes (Signed)
Chaplain responded to a page to the floor for a patient that was going to be intubated and the mother of the patient was having difficulty coping.  Chaplain paged to support mother.  Mother was afraid to see her son if he was going to have the tube on him. Mother talked about other deaths in her family that made her nervous, deaths were her mother and her sister.  Doctor came out and talked with mother to inform her that he was now breathing better and that they have decided to hold off on intubation for right now.    Mother of patient is very fearful of this situation.  She is afraid of losing her son.  She talks about strained relationships within the family and that her son has expressed lately, not feeling loved.      2017/01/08 1338  Clinical Encounter Type  Visited With Patient;Family;Health care provider  Visit Type Spiritual support;Social support;Critical Care  Referral From Nurse  Spiritual Encounters  Spiritual Needs Emotional;Grief support  Stress Factors  Patient Stress Factors Family relationships;Health changes;Major life changes  Family Stress Factors Family relationships;Health changes;Major life changes   Berneice Heinrich.

## 2016-12-14 NOTE — Assessment & Plan Note (Signed)
Hold coreg, dig given possible OD hx But will give lasix given hypoxemic resp failure

## 2016-12-14 NOTE — Progress Notes (Signed)
Pt admitted from [facility]. ED via [facility] transport. Pt assisted to hospital bed and attached to unit monitor and introduced to staff and unit. CHG 6 cloth done as well as MRSA swab.VSS and page placed to admitting MD. Will continue to monitor and assess.

## 2016-12-14 NOTE — Progress Notes (Signed)
Formal pill count done by this RN, and placed in chart. Xarelto only medication in med bag that was listed as medication he took. No wellbutrin, effexor and carvedilol found. Pt's mother stated she threw out empty pill bottles found on yard. She is unable to remember which medication were in the bottles.

## 2016-12-14 NOTE — Progress Notes (Signed)
CCM consulted by internal medicine. CCM NP at the bedside. Instructed RN and RT to prepare for intubation.  ABG pH 7.2, pCo2 52, pO2 52, Bicarn 16.2  Pt on 100% O2 via NRB. Increased WOB with prolonged exhalation observed. Pt arouses to voice but quickly drifts back to sleep. Pt diaphoretic and restless.  Dr. Chase Caller to the bedside. After assessing pt, recommended precedex gtt and bipap as tolerated. Labs ordered.  Will continue to monitor and assess pt closely.

## 2016-12-14 NOTE — Assessment & Plan Note (Signed)
CIWA pritrocol  precedex gtt

## 2016-12-14 NOTE — Progress Notes (Signed)
ANTICOAGULATION CONSULT NOTE - Initial Consult  Pharmacy Consult for IV heparin Indication: pulmonary embolus  Allergies  Allergen Reactions  . Bee Venom Anaphylaxis  . Honey Anaphylaxis    RAW HONEY  . Shellfish Allergy Shortness Of Breath and Swelling  . Ibuprofen Hives and Swelling  . Betadine [Povidone Iodine]     BECAUSE OF SHELLFISH ALLERGY    Patient Measurements: Height: 6\' 3"  (190.5 cm) Weight: (!) 401 lb (181.9 kg) IBW/kg (Calculated) : 84.5 Heparin Dosing Weight: 128.5 kg  Vital Signs: Temp: 98.3 F (36.8 C) (04/28 1134) Temp Source: Oral (04/28 1134) BP: 123/111 (04/28 1300) Pulse Rate: 78 (04/28 1300)  Labs:  Recent Labs  2016-12-12 0131 12-12-16 1214  HGB 16.2  --   HCT 47.7  --   PLT 248  --   LABPROT 14.0  --   INR 1.08  --   CREATININE 1.67* 2.47*    Estimated Creatinine Clearance: 70.1 mL/min (A) (by C-G formula based on SCr of 2.47 mg/dL (H)).   Medical History: Past Medical History:  Diagnosis Date  . Anxiety   . Arthritis    left knee  . Asthma    no inhaler use in 1 year  . CHF (congestive heart failure) ([place])   . Factor V Leiden ([place])   . GERD (gastroesophageal reflux disease)   . History of pulmonary embolus (PE)    takes Xarelto  . History of seizures    no known cause, per pt. - has been "years" since last seizure; no longer on anticonvulsant  . HLD (hyperlipidemia)   . Hypertension    states BP "runs high"; has been on med. "a while"  . Lateral meniscus tear 01/2015   left knee  . Shortness of breath dyspnea   . Sleep apnea    had sleep study 10/2013:  "severe" sleep apnea, states could not afford CPAP machine    Medications:  Infusions:  . dexmedetomidine (PRECEDEX) IV infusion      Assessment: 41 yo male admitted with suspected overdose, ingestion of unknown amounts of PTA meds.  Pt prescribed Xarelto PTA for hx PE.  Suspected Xarelto overdose, but medication bottle still relatively full, non-compliance?  Baseline  INR WNL.  Baseline PTT and heparin level pending.    Pharmacy asked to begin IV Heparin for suspected PE.    Goal of Therapy:  PTT 66-102 Heparin level 0.3-0.7 units/ml Monitor platelets by anticoagulation protocol: Yes   Plan:  1. Start IV heparin with bolus of 5000 units x 1. 2. Then start heparin gtt at 1900 units/hr. 3. Check heparin level 6 hrs after gtt starts. 4. Daily heparin level and CBC.  Uvaldo Rising, BCPS  Clinical Pharmacist Pager [phone]  12/12/2016 2:23 PM

## 2016-12-14 NOTE — Assessment & Plan Note (Signed)
Seen on abg  Plan Check lactate and pct

## 2016-12-14 NOTE — Assessment & Plan Note (Signed)
Baseline ckd-3 Having aki poist admit; did get fluids  Plan Lasix and then recheck bmet

## 2016-12-14 NOTE — Assessment & Plan Note (Signed)
Hold chf meds for now

## 2016-12-14 NOTE — Assessment & Plan Note (Signed)
monitor

## 2016-12-14 NOTE — Progress Notes (Signed)
CCM NP to the bedside to reassess patient, who notified MD pt now requiring intubation. RN notified RT.   VS prior to intubation BP 116/52(64), HR 70-80's, SpO2 86% in 40% Bipap, 78% RA, RR 26-30. Verbal orders to give one amp of Bicarb prior to intubation to anticipate drop in BP.  Pt intubated successfully, positive color change on end tidal CO2, Left lung sound diminished. ETT adjusted.   HR steadily dropped to 30 then 20. No pulse palpated. PEA arrest called. CPR initiated.  Family present at time of code blue. Chaplain to give care to family.  Three code blues were called on patient, please see code sheets. Time of death called at [place] by Dr. Chase Caller. Family notified and at bedside.

## 2016-12-14 NOTE — ED Notes (Signed)
Patient requesting cpap-Dr. Betsey Holiday aware and order obtained-RT called to place

## 2016-12-14 NOTE — Progress Notes (Signed)
Chaplain paged to this room for a code event happening with the patient.   Upon arrival at the unit, Chaplain could hear the mother of the patient yelling out for help.  Mother continuously says she is afraid that she is going to lose her son.  Mother of patient is accompanied by who she says is her boyfriend.  Patient is currently still in critical with low oxygen.   Mother and bf took a walk outside so that mother of patient could get some fresh air.  She is also currently using her inhaler.    Chaplain will remain available for the patient and for staff during this critical event.    01-04-2017 1731  Clinical Encounter Type  Visited With Patient  Visit Type Follow-up;Spiritual support;Code;Critical Care  Referral From Nurse  Spiritual Encounters  Spiritual Needs Grief support;Emotional  Stress Factors  Patient Stress Factors Health changes;Family relationships;Major life changes  Family Stress Factors Health changes;Family relationships   Berneice Heinrich.

## 2016-12-14 NOTE — Assessment & Plan Note (Signed)
ppi for sup

## 2016-12-14 NOTE — Assessment & Plan Note (Signed)
1 episode around 1pm 12-27-16  Plan Monitor zofran as needed Check lipase, amylase

## 2016-12-14 NOTE — Progress Notes (Signed)
MD paged again to notify pt is now requiring 100% O2 on NRB. MD's to the bedside. Pt's WOB increased, RR 30's.

## 2016-12-14 NOTE — Progress Notes (Signed)
[place] MD, Dr. Vaughan Browner, made aware pt's CIWA now 52. MD advised this RN to titrate precedex to 1.2 before giving ativan.   No improvement seen with titrating precedex. Dr. Vaughan Browner to send in house MD to reassess.

## 2016-12-14 NOTE — Procedures (Addendum)
Intubation Procedure Note Donald Brady [id number] Jan 11, 1976  Procedure: Intubation Indications: Respiratory insufficiency Called emergeny by eMD due to progressive deteriorpaton  Procedure Details Consent: Unable to obtain consent because of emergent medical necessity. Time Out: Verified patient identification, verified procedure, site/side was marked, verified correct patient position, special equipment/implants available, medications/allergies/relevent history reviewed, required imaging and test results available.  Performed  Maximum sterile technique was used including cap, gloves, gown, hand hygiene and mask.  MAC  glidescope used after etomidate 65m, rocuronium 1017m fentanyl 10029m, versed 2mg78md amp of bicarb. Precedex stopped well before inubation Large mouth and tongue and floppy but epiglottis identified rapidly with glidescope and 4 size blade and positioned in vallecular. Arytenoids identified and et tube 7.5 positioned with good color change. Readjusted to 22 at lip. Patient had cardiac arrest after intubation and during this time patient did not desat prior to arrest other than transiently. During arrest ETCO2 and pulse ox were maintained. Post CPR patent connected to vent and then desated . mainatining pulse ox at 55%. CXR ordered  Evaluation Hemodynamic Status: cpr; O2 sats: as above, currently low Patient's Current Condition: stable Complications: Complications of cardiac arrest despite adequate meds and preoxygenation and rapid intubation and bic  Patient did not tolerate procedure well. Chest X-ray ordered to verify placement.  CXR: shows diffuse pulm edema  Dr. MuraBrand MalesD., F.C.[facility] Pulmonary and Critical Care Medicine Staff Physician [facility] Pager: [phone] no answer or between  15:00h - 7:00h: call [phone]  4/282018/05/280 PM

## 2016-12-14 NOTE — Progress Notes (Signed)
RT note: Late entry. Rt placed RT radial aline emergently post CPR.

## 2016-12-14 NOTE — Death Summary Note (Signed)
Name: Donald Brady MRN: [id number] DOB: [birth year] 41 y.o.  Date of Admission: 02-Jan-2017 12:54 AM Date of Discharge: Jan 02, 2017 Attending Physician: Brand Males, MD  Discharge Diagnosis: Principal Problem:   Overdose Active Problems:   Bilateral pulmonary embolism (HCC)   Tobacco abuse   Asthma   Severe obstructive sleep apnea   Vomiting   GERD (gastroesophageal reflux disease)   Depression   Cocaine use   Nonobstructive cardiomyopathy (HCC)   Pre-diabetes   Chronic systolic CHF (congestive heart failure) (HCC)   Alcohol abuse   Morbid obesity due to excess calories (HCC)   Acute respiratory failure with hypoxia (HCC)   Metabolic acidosis   Acute on chronic renal insufficiency   Goals of care, counseling/discussion   Encephalopathy acute   Electrolyte imbalance  Cause of death: Cardiogenic shock 2/2 intubation w/ RSI 2/2 Hypoxemic Respiratory Failure 2/2 Overdose Time of death: 1821/12/04  Disposition and follow-up:   Donald Brady was discharged from [facility] in expired condition.   Hospital Course: Donald Brady is a 41 y.o. male with a h/o of NICM and HFrEF (EF 40-45% on 11/17/16, suspected EtOH), non-obstructing CAD 2014/12/05 [facility]), PE (in 2014-12-05 on Xarelto) w/ factor V leiden, asthma, HTN, OSA, CKD stage III, major depression disorder, and EtOH abuse who presented with concern for overdose after reportedly taking unknown amount of multiple medication. According to the patient's mother patient had an argument with his family and became acutely suicidal when he took an unknown quantity of Effexor, Wellbutrin, and carvedilol. Patient was transported [facility] emergency department with acutely altered mental status which was waxing and waning. In the emergency department, patient was hemodynamically stable, found to have mildly low potassium to 3.4 which was repleted. Toxicity levels for acetaminophen, salicylates, and digoxin were all negative for  supratherapeutic levels. Patient's alcohol level was 86mg /dL. Poison control was consulted and given the unknown amount of medication which was ingested, they recommended close observation with specific concerns for seizure, arrhythmia with QT and QRS prolongation, bradycardia, hypotension. It was recommended that his potassium and magnesium levels the repleted to high normal levels, and that the patient be admitted for close observation. [facility] and admitted to the stepdown unit initially. Several hours later patient's mental status improved slightly but he was unable to give a clear history. Patient did deny suicidal ideation at that time.  Slightly later the patient began to have rest or distress with desaturations to 78. He was placed on high flow oxygen via NRB. ABG showed hypoxemia with PaO2 of 52 and significant metabolic acidosis with pH of 7.2 and bicarbonate 16. Patient had a single episode of projectile vomiting followed by aspiration. PCCM was consulted for escalation of care to ICU for continued observation and possible intubation. Initially patient was trialed on BiPAP given that he was maintaining his oxygen saturations, but several hours later it was decided that he should be intubated for protection of his airway as his mental status continued to wax and wane and his hypoxia worsened. During the intubation procedure patient was sedated with rocuronium and following rapid intubation had PEA cardiac arrest. ROSC was achieved after 7 minutes of ACLS. Patient was hemodynamically unstable with low blood pressures. Arterial line was placed and central line was attempted when the patient again had PEA cardiac arrest. ROSC was again achieved after 10 minutes of ACLS. Patient continued to be critically unstable and shortly later had another PEA arrest during ACLS proceedings  patient transition to asystole in spite of maximal medical therapy. Due to the patient's pre-existing  nonischemic cardiomyopathy, ROSC could not be achieved and after more than 30 minutes of total resuscitation efforts, ACLS was discontinued and the patient expired.  Signed: Holley Raring, MD 12/14/16, 6:31 PM

## 2016-12-14 NOTE — Assessment & Plan Note (Addendum)
New and post admission. Tachypenic but not paradoxical. But needing high flow face mask  Plan bipap (Esp in setting of home cpap for osa) o2 for pulse ox > 88% Intubate if worse Lasix x 1 esp in setting of NICM

## 2016-12-14 NOTE — Progress Notes (Signed)
Pt's mother at the bedside and spoke with this RN. Mother states "he's depressed, he has a lot going on and it's wearing on him." According to his mother, he has been worried about 40yo daughter who lives with mom in another state, and he verbalized frustration over his declining health despite taking medications. Pt's mother did confirm he was "popping" pills at home before he came to hospital. At this time pt denies SI. Will continue to monitor and assess pt closely.

## 2016-12-14 NOTE — ED Provider Notes (Signed)
[facility] DEPT Provider Note   CSN: [id number] Arrival date & time: Jan 05, 2017  0053   By signing my name below, I, Delton Prairie, attest that this documentation has been prepared under the direction and in the presence of Orpah Greek, MD  Electronically Signed: Delton Prairie, ED Scribe. 05-Jan-2017. 1:21 AM.   History   Chief Complaint Chief Complaint  Patient presents with  . Drug Overdose    HPI Comments:  Donald Brady is a 41 y.o. male who presents to the Emergency Department, via EMS, after taking an unknown amount of multiple medications a few hours PTA. EMS states he took an unknown amount of of Wellbutrin, 20 mg unknown amount of Xarelto, unknown amount of Effexor and an unknown amount of Carvedilol. Pt states he does not recall taking these medications. Per EMS, pt got into an argument with his family before he took his medications. EMS personal also reports the pt states his dosage of Effexor and Wellbutrin was recently adjusted and he has been having behavioral issues since. No alleviating factors noted. Pt denies any other associated symptoms. No other complaints noted at this time.    The history is provided by the patient. No language interpreter was used.    Past Medical History:  Diagnosis Date  . Anxiety   . Arthritis    left knee  . Asthma    no inhaler use in 1 year  . CHF (congestive heart failure) ([place])   . Factor V Leiden ([place])   . GERD (gastroesophageal reflux disease)   . History of pulmonary embolus (PE)    takes Xarelto  . History of seizures    no known cause, per pt. - has been "years" since last seizure; no longer on anticonvulsant  . HLD (hyperlipidemia)   . Hypertension    states BP "runs high"; has been on med. "a while"  . Lateral meniscus tear 01/2015   left knee  . Shortness of breath dyspnea   . Sleep apnea    had sleep study 10/2013:  "severe" sleep apnea, states could not afford CPAP machine    Patient Active Problem List   Diagnosis Date Noted  . Overdose Jan 05, 2017  . Back pain   . Morbid obesity due to excess calories ([place])   . Presbyopia 01/26/2016  . Alcohol abuse 01/26/2016  . Hypertensive heart disease 10/01/2015  . Chronic systolic CHF (congestive heart failure) ([place]) 10/01/2015  . Umbilical hernia without obstruction and without gangrene 08/29/2015  . Pre-diabetes 08/29/2015  . CKD (chronic kidney disease), stage III 08/19/2015  . Nonobstructive cardiomyopathy ([place]) 05/05/2015  . Cocaine use 12/23/2014  . Depression 10/10/2014  . GERD (gastroesophageal reflux disease) 07/24/2014  . Polycythemia, secondary 05/23/2014  . Factor 5 Leiden mutation, heterozygous ([place]) 05/22/2014  . Hyperlipidemia 04/23/2014  . Sinusitis, chronic 04/23/2014  . Severe obstructive sleep apnea 11/20/2013  . Essential hypertension, benign 08/04/2013  . Asthma 02/20/2013  . Osteoarthritis of left knee 06/28/2012  . Long term current use of anticoagulant therapy 12/02/2011  . Bilateral pulmonary embolism ([place]) 11/27/2011  . Tobacco abuse 11/27/2011    Past Surgical History:  Procedure Laterality Date  . CARDIAC CATHETERIZATION N/A 02/19/2015   Procedure: Left Heart Cath and Coronary Angiography;  Surgeon: Peter M Martinique, MD;  Location: [facility];  Service: Cardiovascular;  Laterality: N/A;  . CHONDROPLASTY Left 03/26/2015   Procedure: CHONDROPLASTY;  Surgeon: Leandrew Koyanagi, MD;  Location: [facility];  Service: Orthopedics;  Laterality:  Left;  . ESOPHAGOGASTRODUODENOSCOPY (EGD) WITH PROPOFOL  07/10/2014  . HAND SURGERY Right    states knuckle removed  . KNEE ARTHROSCOPY WITH LATERAL MENISECTOMY Left 03/26/2015   Procedure: LEFT KNEE ARTHROSCOPY WITH LATERAL MENISECTOMY  CHONDROPLASTY;  Surgeon: Leandrew Koyanagi, MD;  Location: [facility];  Service: Orthopedics;  Laterality: Left;  . ORIF FEMUR FRACTURE Left   . VIDEO BRONCHOSCOPY Bilateral 12/06/2012   Procedure: VIDEO BRONCHOSCOPY  WITHOUT FLUORO;  Surgeon: Juanito Doom, MD;  Location: [facility];  Service: Cardiopulmonary;  Laterality: Bilateral;       Home Medications    Prior to Admission medications   Medication Sig Start Date End Date Taking? Authorizing Provider  atorvastatin (LIPITOR) 40 MG tablet TAKE 1 Tablet BY MOUTH ONCE DAILY 11/17/16  Yes Alexa Angela Burke, MD  buPROPion (WELLBUTRIN XL) 300 MG 24 hr tablet Take 1 tablet (300 mg total) by mouth daily. Dose increase, please d/c previous prescription for bupropion. IM $4 program 11/29/16  Yes Alexa Angela Burke, MD  carvedilol (COREG) 12.5 MG tablet Take 1 tablet (12.5 mg total) by mouth 2 (two) times daily. 11/15/16 11/10/17 Yes Peter M Martinique, MD  digoxin (LANOXIN) 0.125 MG tablet Take 1 tablet (0.125 mg total) by mouth daily. 08/27/16  Yes Shirley Friar, PA-C  folic acid (FOLVITE) 1 MG tablet Take 1 tablet (1 mg total) by mouth daily. 10/01/16  Yes Alexa Angela Burke, MD  furosemide (LASIX) 40 MG tablet Take 80 mg by mouth 2 (two) times daily.   Yes Historical Provider, MD  hydrALAZINE (APRESOLINE) 50 MG tablet Take 1 tablet (50 mg total) by mouth 3 (three) times daily. 11/17/16  Yes Jolaine Artist, MD  isosorbide mononitrate (IMDUR) 30 MG 24 hr tablet Take 1 tablet (30 mg total) by mouth 2 (two) times daily. 11/17/16  Yes Jolaine Artist, MD  metolazone (ZAROXOLYN) 2.5 MG tablet Take 1 tablet (2.5 mg total) by mouth 3 (three) times a week. Tuesday, Thursday, Saturday 09/24/16 12/23/16 Yes Shirley Friar, PA-C  mometasone-formoterol (DULERA) 200-5 MCG/ACT AERO Inhale 2 puffs into the lungs 2 (two) times daily. 09/29/16  Yes Alexa Angela Burke, MD  Multiple Vitamin (MULTIVITAMIN) tablet Take 1 tablet by mouth daily.   Yes Historical Provider, MD  nicotine (NICOTROL) 10 MG inhaler Inhale 1 Cartridge (1 continuous puffing total) into the lungs as needed for smoking cessation. Use at least 6 cartridges daily x 3-4 weeks 09/29/16  Yes Alexa Angela Burke, MD  potassium  chloride SA (K-DUR,KLOR-CON) 20 MEQ tablet Take 40 meq (2 tabs) by mouth daily .Take an add'l 20 mEq (1 tablet) when take metolazone Patient taking differently: Take 20 mEq by mouth 2 (two) times daily.  11/18/16  Yes Shaune Pascal Bensimhon, MD  PROVENTIL HFA 108 (90 Base) MCG/ACT inhaler INHALE 2 PUFFS BY MOUTH EVERY 6 HOURS AS NEEDED FOR COUGHING, WHEEZING, OR SHORTNESS OF BREATH 11/17/16  Yes Alexa Angela Burke, MD  rivaroxaban (XARELTO) 20 MG TABS tablet Take 1 tablet (20 mg total) by mouth daily with supper. 03/18/16  Yes Alexa Angela Burke, MD  spironolactone (ALDACTONE) 25 MG tablet Take 1 tablet (25 mg total) by mouth daily. 08/27/16  Yes Shirley Friar, PA-C  venlafaxine XR (EFFEXOR XR) 37.5 MG 24 hr capsule Take 1 capsule (37.5 mg total) by mouth daily with breakfast. IM program, [facility] 11/08/16  Yes Alexa Angela Burke, MD  nicotine (NICODERM CQ - DOSED IN MG/24 HOURS) 14 mg/24hr patch Place 1 patch (  14 mg total) onto the skin daily. Patient not taking: Reported on 11/26/2016 11/17/16   Jolaine Artist, MD    Family History Family History  Problem Relation Age of Onset  . Diabetes Mother   . Hypertension Mother   . Hearing loss Father     died in his 43's  . Cancer Father     unknown type  . Clotting disorder Father   . Heart disease Father   . Diabetes Maternal Aunt   . Hypertension Maternal Aunt   . Diabetes Maternal Uncle   . Hypertension Maternal Uncle   . Stomach cancer Maternal Uncle   . Lung cancer Paternal Barbaraann Rondo     was a smoker  . Breast cancer Maternal Aunt   . Stomach cancer Maternal Uncle   . Liver disease Maternal Uncle     drinker  . Kidney disease Maternal Aunt     Social History Social History  Substance Use Topics  . Smoking status: Former Smoker    Packs/day: 0.10    Years: 21.00    Types: Cigarettes    Quit date: 07/31/2016  . Smokeless tobacco: Never Used     Comment: a pack a week  . Alcohol use 0.0 oz/week     Comment: everyday     Allergies   Bee  venom; Honey; Shellfish allergy; Ibuprofen; and Betadine [povidone iodine]   Review of Systems Review of Systems  Constitutional: Negative for fever.  Gastrointestinal: Negative for abdominal pain.  All other systems reviewed and are negative.    Physical Exam Updated Vital Signs BP 90/66   Pulse 76   Temp 97.9 F (36.6 C) (Oral)   Resp 18   SpO2 92%   Physical Exam  Constitutional: He is oriented to person, place, and time. He appears well-developed and well-nourished. No distress.  HENT:  Head: Normocephalic and atraumatic.  Right Ear: Hearing normal.  Left Ear: Hearing normal.  Nose: Nose normal.  Mouth/Throat: Oropharynx is clear and moist and mucous membranes are normal.  Eyes: Conjunctivae and EOM are normal. Pupils are equal, round, and reactive to light.  Neck: Normal range of motion. Neck supple.  Cardiovascular: Regular rhythm, S1 normal and S2 normal.  Exam reveals no gallop and no friction rub.   No murmur heard. Pulmonary/Chest: Effort normal and breath sounds normal. No respiratory distress. He exhibits no tenderness.  Abdominal: Soft. Normal appearance and bowel sounds are normal. There is no hepatosplenomegaly. There is no tenderness. There is no rebound, no guarding, no tenderness at McBurney's point and negative Murphy's sign. No hernia.  Musculoskeletal: Normal range of motion.  Neurological: He is alert and oriented to person, place, and time. He has normal strength. No cranial nerve deficit or sensory deficit. Coordination normal. GCS eye subscore is 4. GCS verbal subscore is 5. GCS motor subscore is 6.  Skin: Skin is warm, dry and intact. No rash noted. No cyanosis.  Psychiatric: He has a normal mood and affect. His speech is normal and behavior is normal. Thought content normal.  Nursing note and vitals reviewed.    ED Treatments / Results  DIAGNOSTIC STUDIES:  Oxygen Saturation is 96% on RA, normal by my interpretation.    COORDINATION OF  CARE:  1:11 AM Discussed treatment plan with pt at bedside and pt agreed to plan.  Labs (all labs ordered are listed, but only abnormal results are displayed) Labs Reviewed  COMPREHENSIVE METABOLIC PANEL - Abnormal; Notable for the following:  Result Value   Sodium 133 (*)    Potassium 3.4 (*)    CO2 19 (*)    Glucose, Bld 142 (*)    Creatinine, Ser 1.67 (*)    Calcium 8.4 (*)    Albumin 3.3 (*)    GFR calc non Af Amer 50 (*)    GFR calc Af Amer 58 (*)    All other components within normal limits  ETHANOL - Abnormal; Notable for the following:    Alcohol, Ethyl (B) 86 (*)    All other components within normal limits  ACETAMINOPHEN LEVEL - Abnormal; Notable for the following:    Acetaminophen (Tylenol), Serum <10 (*)    All other components within normal limits  DIGOXIN LEVEL - Abnormal; Notable for the following:    Digoxin Level <0.2 (*)    All other components within normal limits  CBG MONITORING, ED - Abnormal; Notable for the following:    Glucose-Capillary 142 (*)    All other components within normal limits  SALICYLATE LEVEL  CBC  PROTIME-INR  MAGNESIUM  RAPID URINE DRUG SCREEN, HOSP PERFORMED    EKG  EKG Interpretation  Date/Time:  12-13-2016 00:58:58 EDT Ventricular Rate:  81 PR Interval:    QRS Duration: 95 QT Interval:  425 QTC Calculation: 494 R Axis:   -36 Text Interpretation:  Sinus rhythm Borderline prolonged PR interval Left atrial enlargement S1,S2,S3 pattern Borderline T abnormalities, anterior leads Borderline prolonged QT interval No significant change since last tracing Confirmed by POLLINA  MD, CHRISTOPHER ([id number]) on Dec 13, 2016 1:21:47 AM       Radiology No results found.  Procedures Procedures (including critical care time)  Medications Ordered in ED Medications  potassium chloride SA (K-DUR,KLOR-CON) CR tablet 40 mEq (40 mEq Oral Not Given December 13, 2016 0251)  sodium chloride 0.9 % bolus 1,000 mL (1,000 mLs Intravenous New  Bag/Given 12/13/2016 0252)     Initial Impression / Assessment and Plan / ED Course  I have reviewed the triage vital signs and the nursing notes.  Pertinent labs & imaging results that were available during my care of the patient were reviewed by me and considered in my medical decision making (see chart for details).     Patient presents to the emergency department after overdose. Patient reports taking unknown quantities of Xarelto, carvedilol, Effexor XR, Wellbutrin XL sometime prior to arrival. He estimates that it was several hours since the overdose. He is evasive, not will not state why he took the medications. Hesays that he doesn't remember doing it.  Poison control was contacted and provided the following recommendations: -Seizure precautions-can cause status-this may be delayed up to 24 hours -qTc prolongation -Potassium and magnesium levels need to be high normal -Torsades -Ventricular dysrthymias -hypotension -dizziness -bradycardia -QRS widening-if > 0.12 treat with 1-2 meq/kg bicarb boluses until narrow -treat initial hypotension with normal saline fluid boluses -may administer Glucagon -Admit to ICU  Patient has had some mild hypotension here in the ER without bradycardia. No significant QRS widening or QT prolongation. No seizure activity. At this point he is reasonable for admission to stepdown unit for close monitoring.  Final Clinical Impressions(s) / ED Diagnoses   Final diagnoses:  Drug overdose, undetermined intent, initial encounter    New Prescriptions New Prescriptions   No medications on file  I personally performed the services described in this documentation, which was scribed in my presence. The recorded information has been reviewed and is accurate.     Gwenyth Allegra  Betsey Holiday, MD 2016-12-29 [id number]

## 2016-12-14 NOTE — Assessment & Plan Note (Signed)
Start precedex gtt

## 2016-12-14 NOTE — Assessment & Plan Note (Signed)
Has hx of cocaine. REcent UDS + ve for MJ  Plan Check UDS

## 2016-12-14 NOTE — Progress Notes (Addendum)
  Interim note:  We were paged by the nurse at 12:23 PM that pt was requiring NRB. As pt was getting more hypoxic, he was placed on venti mask. Without the mask, his sats on room air were 80% and increased to 94%. His current blood pressure is 96/54. Patient follows command but seems lethargic and seems to be altered. He looks clinically worse than examined this morning.  On exam, pt was very tachypneic and lungs had crackles on exam.   I obtained a stat ABG which showed 03/10/35.4/52/16. Also ordered a stat CXR and trending lactic acid. DO not think he will tolerate BiPap.  I called CCM, who will come and evaluate the patient urgently   Signed Burgess Estelle, MD 1:00  PM  Addendum: 1:10 PM Pt had projectile vomiting while a CXR was being obtained- likely he has aspirated.  Pt had a lot of leftover xarelto tablets in his medication pouch- with his history of PEs, PE is in the differential given that he is hypoxic.  Dr. Chase Caller is at the bedside and pt is being intubated and transferred to the ICU.

## 2016-12-14 NOTE — ED Notes (Signed)
Poison Control contacted: -Seizure precautions-can cause status-this may be delayed up to 24 hours -qTc prolongation -Potassium and magnesium levels need to be high normal -Torsades -Ventricular dysrthymias -hypotension -dizziness -bradycardia -QRS widening-if > 0.12 treat with 1-2 meq/kg bicarb boluses until narrow -treat initial hypotension with normal saline fluid boluses -may administer Glucagon -Admit to ICU

## 2016-12-14 NOTE — Progress Notes (Signed)
Pt denies urge to void. This RN and Tech assisted pt to sit on side of bed and then to stand on side of bed. Pt still unable to void. Pt expresses dizziness, and is unstable on feet. Pt returned to bed. Bladder scan done ~175cc in bladder. Pt refusing I&O cath at this time.

## 2016-12-14 NOTE — Assessment & Plan Note (Signed)
Has hx of asthma  Plan duoneb and pulmicort neb

## 2016-12-14 NOTE — Consult Note (Addendum)
PULMONARY / CRITICAL CARE MEDICINE   Name: Donald Brady MRN: [id number] DOB: [birth year] PCP Florinda Marker, MD LOS 0 as of 10-Jan-2017     ADMISSION DATE:  01-10-17 CONSULTATION DATE:  10-Jan-2017 2:11 PM   REFERRING MD:  Attending dR  Hoffman of IMTS  CHIEF COMPLAINT:  Acute resp distress, and acute encephalopathy  HISTORY OF PRESENT ILLNESS:   41 year old obese African-American gentleman with a history of nonischemic cardiomyopathy and chronic systolic heart failure ejection fraction of 40% in April 2013 suspected due to ethanol, nonobstructive coronary artery disease status post left heart catheterization in 2016 and history of pulmonary embolism in 2006 seen on Xarelto with reported compliance [all the pill bottle counts do not match according to resident], factor V leading deficiency, asthma not otherwise specified, hypertension, sleep apnea on CPAP but was unclear compliance, chronic kidney disease stage III, major depression and ongoing ethanol abuse. He does have a history of suicidal ideation a history of suicidal attempts in the past  According to history obtained from the resident and the resident notes patient had an altercation with family member and then subsequently out of anger overdosed on unspecified amounts of Wellbutrin, Xarelto, Effexor and carvedilol. His mom says that he was seen popping unknown quantity of medications. But later patient has given fluctuating history with a he really took many medications are not. Last alcohol intake was 12/10/2016 p.m. in the emergency department at [facility] long his levels for Tylenol, salicylates and digoxin weight is subtherapeutic or low. Alcohol level was 86 mg/dL. Poison control recommended ICU monitoring for seizure, arrhythmias, QTc prolongation, QRS prolongation and other cardiogenic effects. Potassium and magnesium levels have been recommended to be on the higher side  During the course of his ICU stay is progressively more hypoxemic  needing facemask oxygen with respiratory distress with ongoing intermittent confusion. Chest x-ray is clear and pulmonary has been consulted. Few minutes before arrival patient did have some projectile vomiting of food contents but denied any abdominal pain or headache or double vision  EKG 01-10-17 at 12:30 in p.m.: With QTc 499 ms and QRS of 90 ms  PAST MEDICAL HISTORY :  He  has a past medical history of Anxiety; Arthritis; Asthma; CHF (congestive heart failure) ([place]); Factor V Leiden ([place]); GERD (gastroesophageal reflux disease); History of pulmonary embolus (PE); History of seizures; HLD (hyperlipidemia); Hypertension; Lateral meniscus tear (01/2015); Shortness of breath dyspnea; and Sleep apnea.  PAST SURGICAL HISTORY: He  has a past surgical history that includes Video bronchoscopy (Bilateral, 12/06/2012); ORIF femur fracture (Left); Hand surgery (Right); Esophagogastroduodenoscopy (egd) with propofol (07/10/2014); Cardiac catheterization (N/A, 02/19/2015); Chondroplasty (Left, 03/26/2015); and Knee arthroscopy with lateral menisectomy (Left, 03/26/2015).  Allergies  Allergen Reactions  . Bee Venom Anaphylaxis  . Honey Anaphylaxis    RAW HONEY  . Shellfish Allergy Shortness Of Breath and Swelling  . Ibuprofen Hives and Swelling  . Betadine [Povidone Iodine]     BECAUSE OF SHELLFISH ALLERGY    No current facility-administered medications on file prior to encounter.    Current Outpatient Prescriptions on File Prior to Encounter  Medication Sig  . atorvastatin (LIPITOR) 40 MG tablet TAKE 1 Tablet BY MOUTH ONCE DAILY  . buPROPion (WELLBUTRIN XL) 300 MG 24 hr tablet Take 1 tablet (300 mg total) by mouth daily. Dose increase, please d/c previous prescription for bupropion. IM $4 program  . carvedilol (COREG) 12.5 MG tablet Take 1 tablet (12.5 mg total) by mouth 2 (two) times daily.  . digoxin (  LANOXIN) 0.125 MG tablet Take 1 tablet (0.125 mg total) by mouth daily.  . folic acid (FOLVITE) 1  MG tablet Take 1 tablet (1 mg total) by mouth daily.  . furosemide (LASIX) 40 MG tablet Take 80 mg by mouth 2 (two) times daily.  . hydrALAZINE (APRESOLINE) 50 MG tablet Take 1 tablet (50 mg total) by mouth 3 (three) times daily.  . isosorbide mononitrate (IMDUR) 30 MG 24 hr tablet Take 1 tablet (30 mg total) by mouth 2 (two) times daily.  . metolazone (ZAROXOLYN) 2.5 MG tablet Take 1 tablet (2.5 mg total) by mouth 3 (three) times a week. Tuesday, Thursday, Saturday  . mometasone-formoterol (DULERA) 200-5 MCG/ACT AERO Inhale 2 puffs into the lungs 2 (two) times daily.  . Multiple Vitamin (MULTIVITAMIN) tablet Take 1 tablet by mouth daily.  . nicotine (NICOTROL) 10 MG inhaler Inhale 1 Cartridge (1 continuous puffing total) into the lungs as needed for smoking cessation. Use at least 6 cartridges daily x 3-4 weeks  . potassium chloride SA (K-DUR,KLOR-CON) 20 MEQ tablet Take 40 meq (2 tabs) by mouth daily .Take an add'l 20 mEq (1 tablet) when take metolazone (Patient taking differently: Take 20 mEq by mouth 2 (two) times daily. )  . PROVENTIL HFA 108 (90 Base) MCG/ACT inhaler INHALE 2 PUFFS BY MOUTH EVERY 6 HOURS AS NEEDED FOR COUGHING, WHEEZING, OR SHORTNESS OF BREATH  . rivaroxaban (XARELTO) 20 MG TABS tablet Take 1 tablet (20 mg total) by mouth daily with supper.  Marland Kitchen spironolactone (ALDACTONE) 25 MG tablet Take 1 tablet (25 mg total) by mouth daily.  Marland Kitchen venlafaxine XR (EFFEXOR XR) 37.5 MG 24 hr capsule Take 1 capsule (37.5 mg total) by mouth daily with breakfast. IM program, [facility]  . nicotine (NICODERM CQ - DOSED IN MG/24 HOURS) 14 mg/24hr patch Place 1 patch (14 mg total) onto the skin daily. (Patient not taking: Reported on 11/26/2016)    FAMILY HISTORY:  His indicated that his mother is alive. He indicated that his father is alive. He indicated that the status of his paternal uncle is unknown.    SOCIAL HISTORY: He  reports that he quit smoking about 4 months ago. His smoking use included  Cigarettes. He has a 2.10 pack-year smoking history. He has never used smokeless tobacco. He reports that he drinks alcohol. He reports that he uses drugs.    VITAL SIGNS: BP (!) 123/111   Pulse 78   Temp 98.3 F (36.8 C) (Oral)   Resp (!) 27   Ht 6\' 3"  (1.905 m)   Wt (!) 181.9 kg (401 lb)   SpO2 95%   BMI 50.12 kg/m   HEMODYNAMICS:    VENTILATOR SETTINGS: FiO2 (%):  [55 %-100 %] 100 %  INTAKE / OUTPUT: I/O last 3 completed shifts: In: 4000 [I.V.:3000; IV Piggyback:1000] Out: -      EXAM  General Appearance:    Obese male in ICU bed   Head:    Normocephalic, without obvious abnormality, atraumatic  Eyes:    PERRL - Yes, conjunctiva/corneas - muddy      Ears:    Normal external ear canals, both ears  Nose:   NG tube - No   Throat:  ETT TUBE - No , OG tube - no   Neck:   Supple,  No enlargement/tenderness/nodules     Lungs:     Clear to auscultation bilaterallyBut distant heart sounds because of obesity. Tachypneic respiratory rate 30 but not paradoxical   Chest wall:  No deformity  Heart:    S1 and S2 normal, no murmur, CVP - No.  Pressors - no   Abdomen:     Soft, no masses, no organomegaly  Genitalia:    Not done  Rectal:   not done  Extremities:   Extremities- Chronic mild edema      Skin:   Intact in exposed areas .     Neurologic:   Sedation - None -> RASS - +2 equal . Moves all 4s - yes. CAM-ICU - positive for delirium but protecting airway . Orientation - only partially oriented        LABS  PULMONARY  Recent Labs Lab 12/25/16 1242  PHART 7.255*  PCO2ART 36.4  PO2ART 52.0*  HCO3 16.2*  TCO2 17  O2SAT 81.0    CBC  Recent Labs Lab 2016/12/25 0131  HGB 16.2  HCT 47.7  WBC 8.9  PLT 248    COAGULATION  Recent Labs Lab 2016-12-25 0131  INR 1.08    CARDIAC  No results for input(s): TROPONINI in the last 168 hours. No results for input(s): PROBNP in the last 168 hours.   CHEMISTRY  Recent Labs Lab Dec 25, 2016 0131  Dec 25, 2016 1214  NA 133* 133*  K 3.4* 5.6*  CL 102 108  CO2 19* 16*  GLUCOSE 142* 177*  BUN 12 12  CREATININE 1.67* 2.47*  CALCIUM 8.4* 8.0*  MG 2.0  --    Estimated Creatinine Clearance: 70.1 mL/min (A) (by C-G formula based on SCr of 2.47 mg/dL (H)).   LIVER  Recent Labs Lab Dec 25, 2016 0131 25-Dec-2016 1214  AST 28 49*  ALT 35 43  ALKPHOS 63 62  BILITOT 0.7 1.1  PROT 6.7 6.7  ALBUMIN 3.3* 3.4*  INR 1.08  --      INFECTIOUS No results for input(s): LATICACIDVEN, PROCALCITON in the last 168 hours.   ENDOCRINE CBG (last 3)   Recent Labs  25-Dec-2016 0112  GLUCAP 142*         IMAGING x48h  - image(s) personally visualized  -   highlighted in bold Dg Chest Port 1 View  Result Date: Dec 25, 2016 CLINICAL DATA:  Overdose and ethanol intoxication. EXAM: PORTABLE CHEST 1 VIEW COMPARISON:  12/02/2016. FINDINGS: Cardiac enlargement. Mild vascular congestion. Low lung volumes. Small amount of fluid is seen in the horizontal fissure. No consolidation. No pneumothorax. Bones unremarkable. Worsening aeration compared with priors. IMPRESSION: Cardiomegaly with low lung volumes. Mild vascular congestion. No consolidation or overt failure. Worsening aeration from priors. Electronically Signed   By: Staci Righter M.D.   On: 25-Dec-2016 13:07       ASSESSMENT and PLAN  Overdose Unclear how much of what at admit. So far not exhibiting bp/hr or EKG issues or metabolic.  HAs confusion though/. INitialy said he took OD of xarelto but bottle shows he probably was not taking xaretlo. EKG 25-Dec-2016 pm with normal QTc and QRs but at upper limit normal  Plan Close ICU monitoring Check EKG again 12/12/16  Hold off hydration due to acute resp hypoxemic failure Check Facotr 10A level, PT, PTT   Acute respiratory failure with hypoxia (HCC) New and post admission. Tachypenic but not paradoxical. But needing high flow face mask  Plan bipap (Esp in setting of home cpap for osa) o2 for  pulse ox > 88% Intubate if worse Lasix x 1 esp in setting of NICM  Metabolic acidosis Seen on abg  Plan Check lactate and pct  Acute on chronic renal insufficiency Baseline  ckd-3 Having aki poist admit; did get fluids  Plan Lasix and then recheck bmet  Bilateral pulmonary embolism ([place]) Hx of PE and on xaretrlko chronic; ? complance. Unclear if he OD'ed on xarelto but probably not. INR normal here. CXR clear  Plan IV heparing gtt given acute hypoxemnic resp failure in setting of clear cxr Check Facotr 10A level, PT, PTT  Depression Prior suicide attempot Current OD in setting of anger  Plan Sitter at bedside Will need pscyh consult  Chronic systolic CHF (congestive heart failure) (HCC) Hold chf meds for now  Alcohol abuse CIWA pritrocol  precedex gtt  Encephalopathy acute Start precedex gtt  Severe obstructive sleep apnea Will need cpap qhs when better  Nonobstructive cardiomyopathy ([place]) Hold coreg, dig given possible OD hx But will give lasix given hypoxemic resp failure  Asthma Has hx of asthma  Plan duoneb and pulmicort neb  GERD (gastroesophageal reflux disease) ppi for sup  Pre-diabetes ssi  Morbid obesity due to excess calories ([place]) monitor  Cocaine use Has hx of cocaine. REcent UDS + ve for MJ  Plan Check UDS  Goals of care, counseling/discussion An emitonal mom counseled at bedside and gave empathetic listening  Tobacco abuse Nicotine patch  Vomiting 1 episode around 1pm Dec 28, 2016  Plan Monitor zofran as needed Check lipase, amylase  Electrolyte imbalance Hyperkalemia +  Plan 'kayexalatge Ami for high normal mag and phos  Recheck labs      FAMILY  - Updates: 2016-12-28 -->mom and patient at [place]  - Inter-disciplinary family meet or Palliative Care meeting due by:  DAy 7. Current LOS is LOS 0 days  CODE STATUS    Code Status Orders        Start     Ordered   12-28-16 0832  Full code  Continuous      2016-12-28 0835    Code Status History    Date Active Date Inactive Code Status Order ID Comments User Context   08/24/2016  9:26 PM 08/27/2016  4:44 PM Full Code [id number]  Burgess Estelle, MD Inpatient   08/24/2016  7:28 AM 08/24/2016  9:26 PM Full Code [id number]  Shela Leff, MD ED   08/17/2016 10:58 PM 08/20/2016  7:37 PM Full Code [id number]  Shela Leff, MD Inpatient   02/27/2016 10:48 PM 02/28/2016  6:08 PM Full Code [id number]  Chesley Mires Ahmed, MD ED   08/19/2015  9:29 AM 08/20/2015  4:32 PM Full Code [id number]  Bethena Roys, MD Inpatient   03/26/2015 10:37 AM 03/27/2015  9:33 AM Full Code [id number]  Leandrew Koyanagi, MD Inpatient   02/19/2015 12:40 PM 02/19/2015  8:25 PM Full Code [id number]  Peter M Martinique, MD Inpatient   11/27/2011  1:21 PM 11/29/2011  6:47 PM Full Code [id number]  Ainsley Spinner, RN Inpatient        DISPO Keep in ICU      The patient is critically ill with multiple organ systems failure and requires high complexity decision making for assessment and support, frequent evaluation and titration of therapies, application of advanced monitoring technologies and extensive interpretation of multiple databases.   Critical Care Time devoted to patient care services described in this note is  75  Minutes. This time reflects time of care of this signee Dr Brand Males. This critical care time does not reflect procedure time, or teaching time or supervisory time of PA/NP/Med student/Med Resident etc but could involve care discussion time    Dr. Brand Males, M.D., [facility].C.P Pulmonary  and Critical Care Medicine Staff Physician [place] [facility] Pager: [phone], If no answer or between  15:00h - 7:00h: call [phone]  01-04-17 2:11 PM

## 2016-12-14 NOTE — Progress Notes (Signed)
CRITICAL VALUE ALERT  Critical value received:  Lactic acid 3  Date of notification:  Dec 26, 2016  Time of notification: 7579  Critical value read back:Yes.    Nurse who received alert:  San Jetty RN   MD notified (1st page):  Dr. Vaughan Browner  Time of first page:  1544  Dr. Vaughan Browner made aware. No new orders given

## 2016-12-14 NOTE — Progress Notes (Signed)
  Patient Name: Donald Brady   MRN: [id number]   Date of Birth/ Sex: 08-Jul-1976 , male      Admission Date: 03-Jan-2017  Attending Provider: Brand Males, MD  Primary Diagnosis: Overdose   Indication: Pt was in his usual state of health until this PM, when he was noted to be bradycardic then had PEA during sedation for intubation with PCCM. Code blue was subsequently called. At the time of arrival on scene, ACLS protocol was underway.   Technical Description:  - CPR performance duration:  7 minutes  - Was defibrillation or cardioversion used? No   - Was external pacer placed? No  - Was patient intubated pre/post CPR? Yes   Medications Administered: Y = Yes; Blank = No Amiodarone    Atropine    Calcium  X  Epinephrine  x3  Lidocaine    Magnesium    Norepinephrine    Phenylephrine    Sodium bicarbonate  X  Vasopressin     Post CPR evaluation:  - Final Status - Was patient successfully resuscitated ? Yes - What is current rhythm? Sinus Tach - What is current hemodynamic status? Stable, soft BP  Miscellaneous Information:  - Labs sent, including: none  - Primary team notified?  Yes  - Family Notified? Yes  - Additional notes/ transfer status: Pt given D50+insulin post-ROSC     Holley Raring, MD  2017-01-03, 5:19 PM

## 2016-12-14 NOTE — Progress Notes (Addendum)
Followup note and summary  : Was called by eMD upon his review of labs and clinical status that patient was not responding to bipap or precedex and having progressive resp failure and encephalopathy. Labs showed lactic acidosis.  Patient intubated - though tough airway was intubated at first attempt without difficulty. But despite bicarb prior to intubation, lost pulse and was in PEA arrest that intermittently lasted 4-5 rounds for > 1h and total CPR Time > 30-40 minutes. PAtient finally declared dead due to loss of pulse and inability to gain ROSC despite levophed gtt and epi gtt and adwquate airway and wide variety of meds includiing correction of hyperkalemia   PULMONARY  Recent Labs Lab 12/22/16 1242  PHART 7.255*  PCO2ART 36.4  PO2ART 52.0*  HCO3 16.2*  TCO2 17  O2SAT 81.0    CBC  Recent Labs Lab 12-22-2016 0131 12-22-16 1431  HGB 16.2 16.8  HCT 47.7 49.9  WBC 8.9 11.2*  PLT 248 130*    COAGULATION  Recent Labs Lab 12/22/16 0131 12-22-16 1752  INR 1.08 1.21    CARDIAC  No results for input(s): TROPONINI in the last 168 hours. No results for input(s): PROBNP in the last 168 hours.   CHEMISTRY  Recent Labs Lab 12/22/16 0131 Dec 22, 2016 1214 22-Dec-2016 1431  NA 133* 133* 134*  K 3.4* 5.6* 5.5*  CL 102 108 106  CO2 19* 16* 19*  GLUCOSE 142* 177* 148*  BUN 12 12 14   CREATININE 1.67* 2.47* 2.71*  CALCIUM 8.4* 8.0* 8.2*  MG 2.0  --  2.0  PHOS  --   --  4.5   Estimated Creatinine Clearance: 63.9 mL/min (A) (by C-G formula based on SCr of 2.71 mg/dL (H)).   LIVER  Recent Labs Lab Dec 22, 2016 0131 12-22-16 1214 22-Dec-2016 1752  AST 28 49*  --   ALT 35 43  --   ALKPHOS 63 62  --   BILITOT 0.7 1.1  --   PROT 6.7 6.7  --   ALBUMIN 3.3* 3.4*  --   INR 1.08  --  1.21     INFECTIOUS  Recent Labs Lab 12/22/16 1431  LATICACIDVEN 3.0*  PROCALCITON <0.10     ENDOCRINE CBG (last 3)   Recent Labs  2016/12/22 0112  GLUCAP 142*          IMAGING x48h  - image(s) personally visualized  -   highlighted in bold Dg Chest Port 1 View  Result Date: 12/22/16 CLINICAL DATA:  Check endotracheal tube placement EXAM: PORTABLE CHEST 1 VIEW COMPARISON:  Film from earlier in the same day. FINDINGS: Endotracheal tube is now seen 7.6 cm above the carina at the level of the thoracic inlet. Cardiac enlargement is again seen. Mild vascular congestion is again noted with some increase in right basilar atelectasis. No bony abnormality is seen. IMPRESSION: Endotracheal tube as described. Increase in right basilar atelectasis. Stable vascular congestion and cardiomegaly. Electronically Signed   By: Inez Catalina M.D.   On: 2016/12/22 17:45   Dg Chest Port 1 View  Result Date: 22-Dec-2016 CLINICAL DATA:  Overdose and ethanol intoxication. EXAM: PORTABLE CHEST 1 VIEW COMPARISON:  12/02/2016. FINDINGS: Cardiac enlargement. Mild vascular congestion. Low lung volumes. Small amount of fluid is seen in the horizontal fissure. No consolidation. No pneumothorax. Bones unremarkable. Worsening aeration compared with priors. IMPRESSION: Cardiomegaly with low lung volumes. Mild vascular congestion. No consolidation or overt failure. Worsening aeration from priors. Electronically Signed   By: Roderic Ovens.D.  On: Dec 17, 2016 13:07      Diagnosis  - Drug overdose  - acute pulmonary edema - AKI - hyperkalemia - acidosis Acute encephalopathy - cardiac arrest   Independent critical care time - additional 40 min independnt of procedure time  Death summary by teaching service  Dr. Brand Males, M.D., [facility].C.P Pulmonary and Critical Care Medicine Staff Physician [place] [facility] Pager: [phone], If no answer or between  15:00h - 7:00h: call [phone]  17-Dec-2016 6:39 PM

## 2016-12-14 NOTE — Assessment & Plan Note (Signed)
An emitonal mom counseled at bedside and gave empathetic listening

## 2016-12-14 NOTE — Assessment & Plan Note (Signed)
Hyperkalemia +  Plan 'kayexalatge Ami for high normal mag and phos  Recheck labs

## 2016-12-14 NOTE — Assessment & Plan Note (Signed)
ssi 

## 2016-12-14 NOTE — Progress Notes (Signed)
Chaplain paged to unit for this patient who had another code event.  Chaplain providing support to the mother as she struggles to accept what is happening to the patient.  Mother continues to talk of the depression her son has.  Mother also talks about patient not seeing his children in around 180 days.  Patient's girlfriend, also in room.  Once mother returns, Bonney Roussel is present with large medical staff to update mother on status of patient.  Patient expresses disbelief and goes through a number of series trying to wake her son.  Chaplain, AC and several other staff members assist patient's mother through this process.    Chaplain provides mother with placement card.  Chaplain walks family out to continue to support mother during this difficult time.    Chaplain would like to thank the entire 2N, Respiratory Team and Kim for the continual support shown towards this family.    Jan 10, 2017 1929  Clinical Encounter Type  Visited With Family;Patient and family together  Visit Type Spiritual support;Social support;Critical Care;Death  Spiritual Encounters  Spiritual Needs Grief support;Emotional  Stress Factors  Family Stress Factors Loss   Berneice Heinrich

## 2016-12-14 NOTE — Assessment & Plan Note (Signed)
-  Nicotine patch 

## 2016-12-14 NOTE — ED Triage Notes (Signed)
Patient arrives by EMS with drug overdose. Patient states he got into an argument with his family, recent dosage adjustment of effexor and welbutrin and states he has been having behavioral issues since. Patient took: -Welbutrin unknown dose/unknown amount -Xarelto 20 mg unknown amount -Effexor unknown amount and dose -Carvedilol unknown dose and amount Patient told EMS he "drank a lot of beer"

## 2016-12-14 NOTE — ED Notes (Signed)
Bed: RESB Expected date:  Expected time:  Means of arrival:  Comments: EMS Overdose

## 2016-12-14 NOTE — Assessment & Plan Note (Signed)
Will need cpap qhs when better

## 2016-12-14 NOTE — H&P (Signed)
Date: 12-17-2016         Patient Name:  Donald Brady MRN: [id number]  DOB: [birth year] Age / Sex: 41 y.o., male   PCP: Florinda Marker, MD         Medical Service: Internal Medicine Teaching Service         Attending Physician: Dr. Lucious Groves, DO    First Contact: Dr. Holley Raring Pager: [phone]  Second Contact: Dr. Burgess Estelle Pager: [phone]       After Hours (After 5p /  First Contact Pager: [phone]  Weekends / Holidays): Second Contact Pager: [phone]   Chief Complaint: Overdose and intoxication  History of Present Illness: [name] is a 41 y.o. male with a h/o of NICM and HFrEF (EF 40-45% on 11/17/16, suspected EtOH), non-obstructing CAD (2016 [place]), PE (in 2016 on Xarelto) w/ factor V leiden, asthma, HTN, OSA, CKD stage III, major depression disorder, and EtOH abuse who presents with concern for overdose after reportedly taking unknown amount of multiple medication. Pt is acutely altered with waxing and waning attention and cooperation during the interview and history is largely conducted from review of the medical record.  Patient was reportedly transported to [facility] emergency department by EMS after reports that he had overdosed on multiple medications. EMS reported that he had taken an unknown amount of Wellbutrin, Xarelto, Effexor, and carvedilol. EMS also notes that patient had an argument with family prior to this reported overdose. On questioning, the patient is unable to give good history of any overdose, but denies that he took too much of his medication. He does note that he is "high on carvedilol". Patient also endorses significant alcohol use last night and early this morning, reporting that he drank beer, but denies any other substance use. Patient notes that he was with his girlfriend, death, last night going out to a party. He does not remember specifics of what happened after that. During the interview patient is intermittently confused and falls asleep.  Some of his answers are coherent but the patient consistently mumbles. He is able to follow some commands intermittently.  Patient reports that he does have a history of suicidal ideation, and history of suicide attempts in the remote past. Last reported suicidal ideation was in March according to the patient, and the patient denies any thoughts of self-harm or suicide presently.  In the emergency department, patient was hemodynamically stable, found to have mildly low potassium to 3.4 which was repleted. Toxicity levels for acetaminophen, salicylates, and digoxin were all negative for supratherapeutic levels. Patient's alcohol level was 86mg /dL. Poison control was consulted and given the unknown amount of medication which was ingested, they recommended close observation with specific concerns for seizure, arrhythmia with QT and QRS prolongation, bradycardia, hypotension. It was recommended that his potassium and magnesium levels the repleted to high normal levels, and that the patient be admitted for close observation. Patient remained hemodynamically stable, with borderline hypotension which was treated with normal saline fluid bolus. Patient was transferred from [place] for admission by [facility].  Meds: Current Facility-Administered Medications  Medication Dose Route Frequency Provider Last Rate Last Dose  . 0.9 %  sodium chloride infusion   Intravenous Continuous Burgess Estelle, MD 100 mL/hr at 12/17/2016 0915    . acetaminophen (TYLENOL) tablet 650 mg  650 mg Oral Q6H PRN Burgess Estelle, MD       Or  . acetaminophen (TYLENOL) suppository 650 mg  650 mg  Rectal Q6H PRN Burgess Estelle, MD      . folic acid injection 1 mg  1 mg Intravenous Daily Burgess Estelle, MD   1 mg at 12/18/16 1017  . heparin injection 5,000 Units  5,000 Units Subcutaneous Q8H Burgess Estelle, MD      . LORazepam (ATIVAN) injection 2-3 mg  2-3 mg Intravenous Q1H PRN Burgess Estelle, MD      . potassium chloride SA  (K-DUR,KLOR-CON) CR tablet 40 mEq  40 mEq Oral Once Orpah Greek, MD      . senna-docusate (Senokot-S) tablet 1 tablet  1 tablet Oral QHS PRN Burgess Estelle, MD      . sodium chloride flush (NS) 0.9 % injection 3 mL  3 mL Intravenous Q12H Burgess Estelle, MD   3 mL at 2016/12/18 0915  . thiamine (B-1) injection 100 mg  100 mg Intravenous Daily Burgess Estelle, MD   100 mg at 18-Dec-2016 0920   Allergies: Allergies as of 12-18-2016 - Review Complete 12/18/16  Allergen Reaction Noted  . Bee venom Anaphylaxis 11/27/2011  . Honey Anaphylaxis 11/27/2011  . Shellfish allergy Shortness Of Breath and Swelling 11/27/2011  . Ibuprofen Hives and Swelling 11/27/2011  . Betadine [povidone iodine]  01/20/2015   Past Medical History:  Diagnosis Date  . Anxiety   . Arthritis    left knee  . Asthma    no inhaler use in 1 year  . CHF (congestive heart failure) ([place])   . Factor V Leiden ([place])   . GERD (gastroesophageal reflux disease)   . History of pulmonary embolus (PE)    takes Xarelto  . History of seizures    no known cause, per pt. - has been "years" since last seizure; no longer on anticonvulsant  . HLD (hyperlipidemia)   . Hypertension    states BP "runs high"; has been on med. "a while"  . Lateral meniscus tear 01/2015   left knee  . Shortness of breath dyspnea   . Sleep apnea    had sleep study 10/2013:  "severe" sleep apnea, states could not afford CPAP machine   Family History: Pt family history includes Breast cancer in his maternal aunt; Cancer in his father; Clotting disorder in his father; Diabetes in his maternal aunt, maternal uncle, and mother; Hearing loss in his father; Heart disease in his father; Hypertension in his maternal aunt, maternal uncle, and mother; Kidney disease in his maternal aunt; Liver disease in his maternal uncle; Lung cancer in his paternal uncle; Stomach cancer in his maternal uncle and maternal uncle.  Social History: Pt  reports that he quit smoking  about 4 months ago. His smoking use included Cigarettes. He has a 2.10 pack-year smoking history. He has never used smokeless tobacco. He reports that he drinks alcohol. He reports that he uses drugs.  Review of Systems:  Review of Systems  Unable to perform ROS: Mental status change   Physical Exam: Vitals:   2016/12/18 0905 12/18/2016 0915 Dec 18, 2016 0930 12-18-2016 1000  BP: (!) 72/57 97/74 (!) 88/75 96/85  Pulse:      Resp: 14 12 (!) 23   Temp:      TempSrc:      SpO2:      Weight:      Height:       Physical Exam  Constitutional: He is cooperative.  Obese male, mildly diaphoretic, confused with waxing waning attention, lying in bed in NAD. Intermittently trying to get OOB.  HENT:  Head: Normocephalic  and atraumatic.  Right Ear: Hearing normal.  Left Ear: Hearing normal.  Nose: Nose normal.  Mouth/Throat: Mucous membranes are dry.  Eyes: Pupils are equal, round, and reactive to light.  Cardiovascular: Normal rate, regular rhythm, S1 normal, S2 normal and intact distal pulses.  Exam reveals no gallop.   No murmur heard. Pulmonary/Chest: Effort normal and breath sounds normal. No respiratory distress. He has no wheezes. He has no rhonchi. He has no rales. He exhibits no tenderness.  Abdominal: Soft. Normal appearance and bowel sounds are normal. He exhibits no ascites. There is no hepatosplenomegaly. There is no tenderness.  Neurological: He is alert. He has normal strength. He is disoriented (Oriented to self and [facility].).  Pt follows commands with repeated prompting. Poor effort and cooperation with exam. No gross FND.  Skin: Skin is warm and intact.  Psychiatric: His speech is slurred. He is agitated. Cognition and memory are impaired. He expresses no suicidal ideation. He expresses no suicidal plans. He is inattentive.   Labs: CBC:  Recent Labs Lab 2016/12/14 0131  WBC 8.9  HGB 16.2  HCT 47.7  MCV 87.5  PLT 627   Basic Metabolic Panel:  Recent Labs Lab 14-Dec-2016 0131    NA 133*  K 3.4*  CL 102  CO2 19*  GLUCOSE 142*  BUN 12  CREATININE 1.67*  CALCIUM 8.4*  MG 2.0   Coagulation Studies:  Recent Labs  2016/12/14 0131  LABPROT 14.0  INR 1.08   Liver Function Tests:  Recent Labs Lab Dec 14, 2016 0131  AST 28  ALT 35  ALKPHOS 63  BILITOT 0.7  PROT 6.7  ALBUMIN 3.3*   CBG: Lab Results  Component Value Date   HGBA1C 6.0 (H) 08/19/2015   Recent Labs Lab 2016-12-14 0112  GLUCAP 142*    Imaging: EKG Interpretation  Date/Time:  Dec 14, 2016 00:58:58 EDT Ventricular Rate:  81 PR Interval:    QRS Duration: 95 QT Interval:  425 QTC Calculation: 494 R Axis:   -36 Text Interpretation:  Sinus rhythm Borderline prolonged PR interval Left atrial enlargement S1,S2,S3 pattern Borderline T abnormalities, anterior leads Borderline prolonged QT interval No significant change since last tracing Confirmed by POLLINA  MD, CHRISTOPHER [phone]) on 12/14/16 1:21:47 AM  Assessment & Plan by Problem: Active Problems:   Overdose  Mr. REVIN CORKER is a 41 y.o. male with complicated PMH who reportedly took unknown quantities of multiple medications and presents with EtOH intoxication and AMS.  1) Concern for OD: pt is altered and cannot provide reliable hx, collateral in the chart is limited, will need to s/w GF Beth for further information. Home meds were examined and pill counts are more consistent with non-adherence to medication and do not seem to be inappropriately low to suggest OD. We will attempt formal pill count. EtOH level suggests acute alcohol intoxication and pt has h/o alcohol abuse, but had recent UDS positive only for MJ. Pt denies SI currently, but unclear if reliable, will need psych c/s. Poison control recommended at least 48hr observation. EKG w/o QT/QRS abnormalities. Will repeat in AM, continue tele. - admit to SDU for monitoring - IVF and replete lytes PRN - thiamine/folate - repeat EKG in AM - hold home meds - pill count -  f/u UDS - sitter - NPO, sips w/ meds - will get psych c/s when MS clears for SI clearance  2) HFrEF: Last echo 4/418 w/ EF 40-45%, follows with Dr. Haroldine Laws. Home meds: furosemide 80mg  BID, metolazone 2.5mg   T/Th/Sat, spiro 25mg  qD, Hydralazine 50mg  TID, Imdur 30mg  BID, Coreg 12.5mg  BID. Holding meds for concern of OD. Can restart as MS clears. 3) CKD: SCr consistent with b/l on arrival at 1.6. 4) HTN:  Home meds as above, currently holding. Will restart for HTN >180/110 or once MS clears. 5) OSA: uses CPAP qHS, continue if able to tolerate 6) h/o PE: was on long term Xarelto, INR wnl. Will hold for OD and use SQ heparin for VTE ppx. 7) EtOH Abuse: unclear quantity of regular alcohol use, intoxicated on presentation. Will use CIWA for withdrawal. Replace thiamine/folate.  DVT PPx - heparin  Code Status - Full, unable to d/w patient d/t AMS  Consults Placed - will call psych after AMS clears  Dispo: Admit patient to Inpatient with expected length of stay greater than 2 midnights.  Signed: Holley Raring, MD 2016/12/13, 10:30 AM  Pager: [phone]

## 2016-12-14 DEATH — deceased

## 2016-12-15 ENCOUNTER — Encounter (HOSPITAL_COMMUNITY): Payer: Self-pay

## 2016-12-16 MED FILL — Medication: Qty: 1 | Status: AC

## 2016-12-29 IMAGING — CR DG CHEST 2V
2 series · 2 of 2 positions shown · non-contrast
Comparison: 05/29/2016 .

CLINICAL DATA: Shortness of breath.

EXAM:
CHEST  2 VIEW

[chest pa]
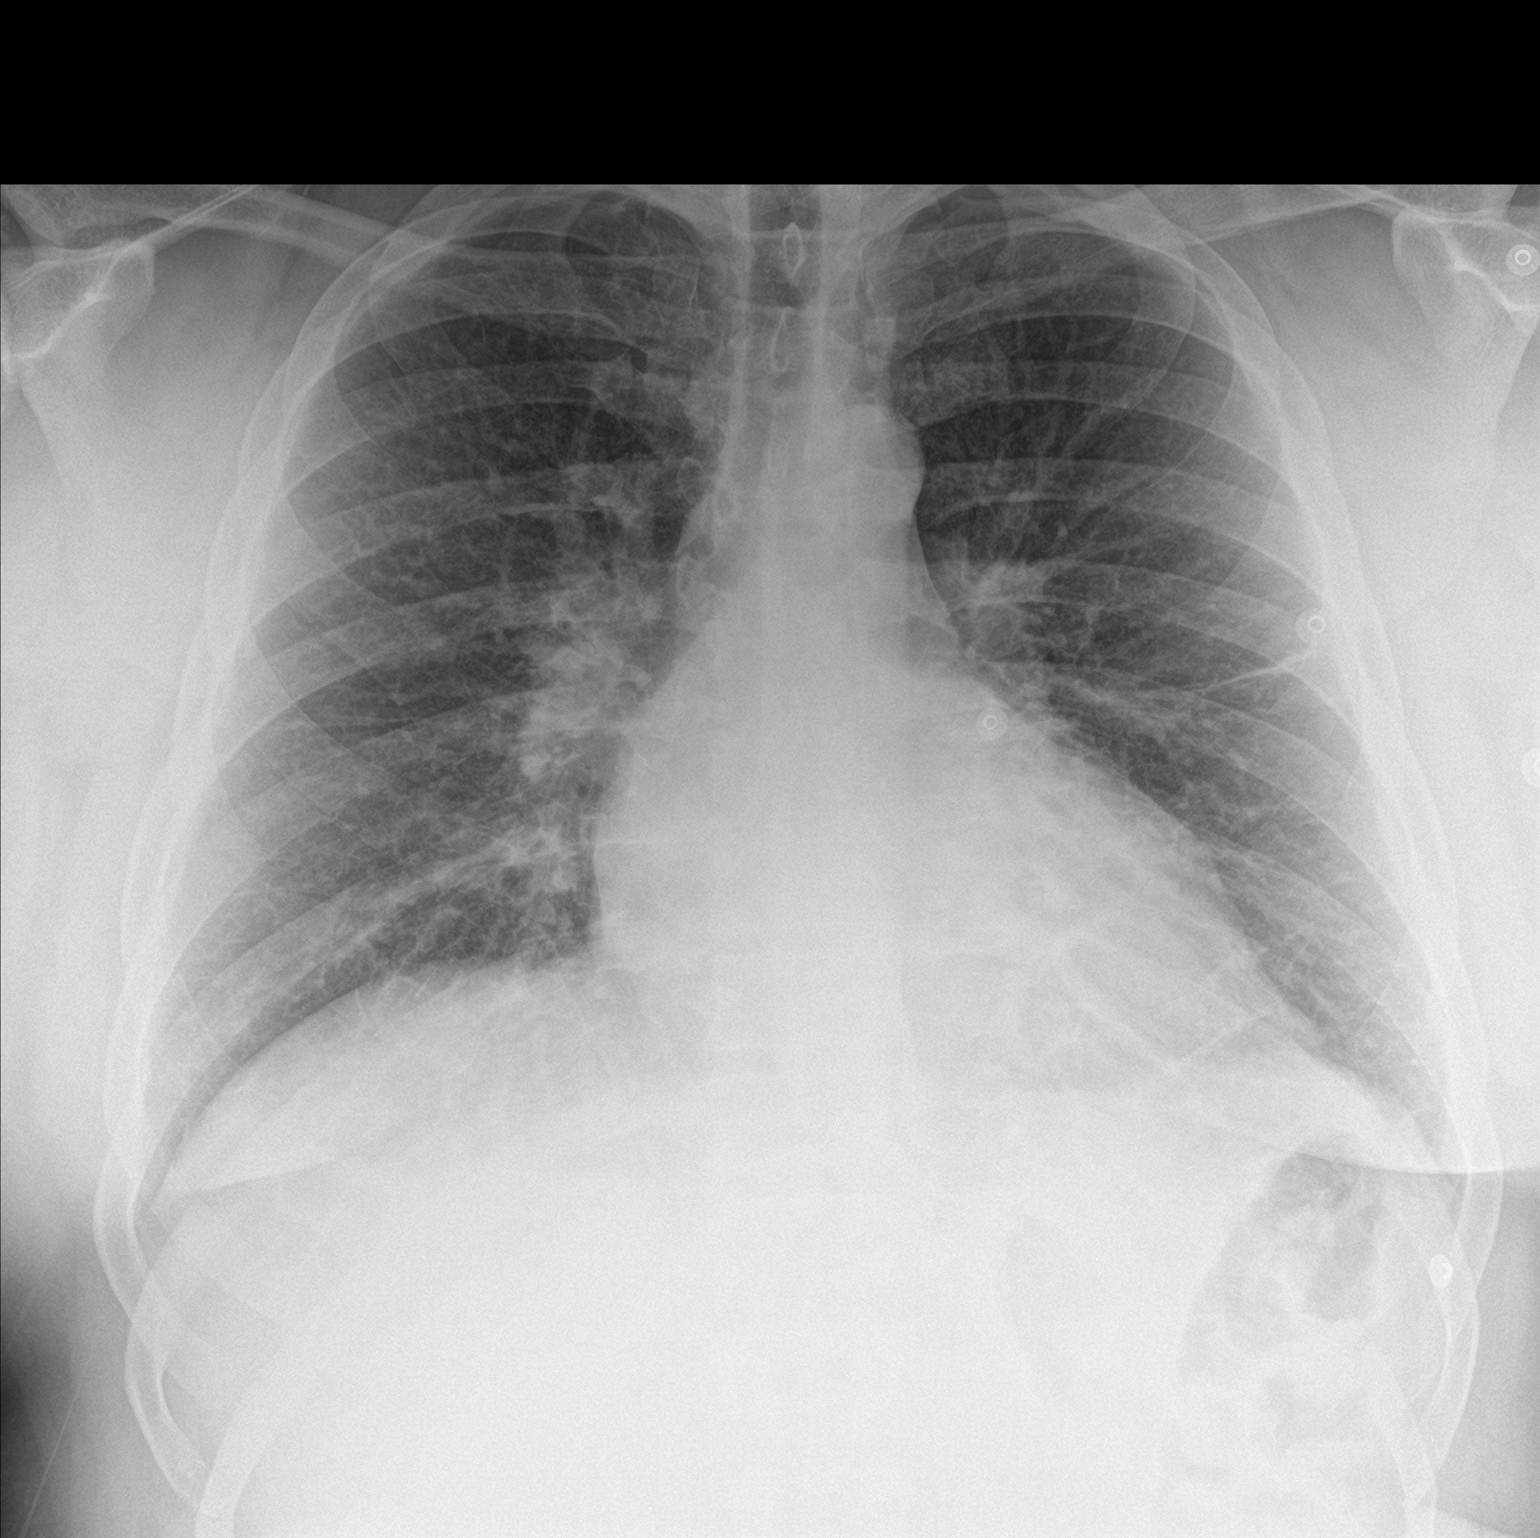

[chest lat]
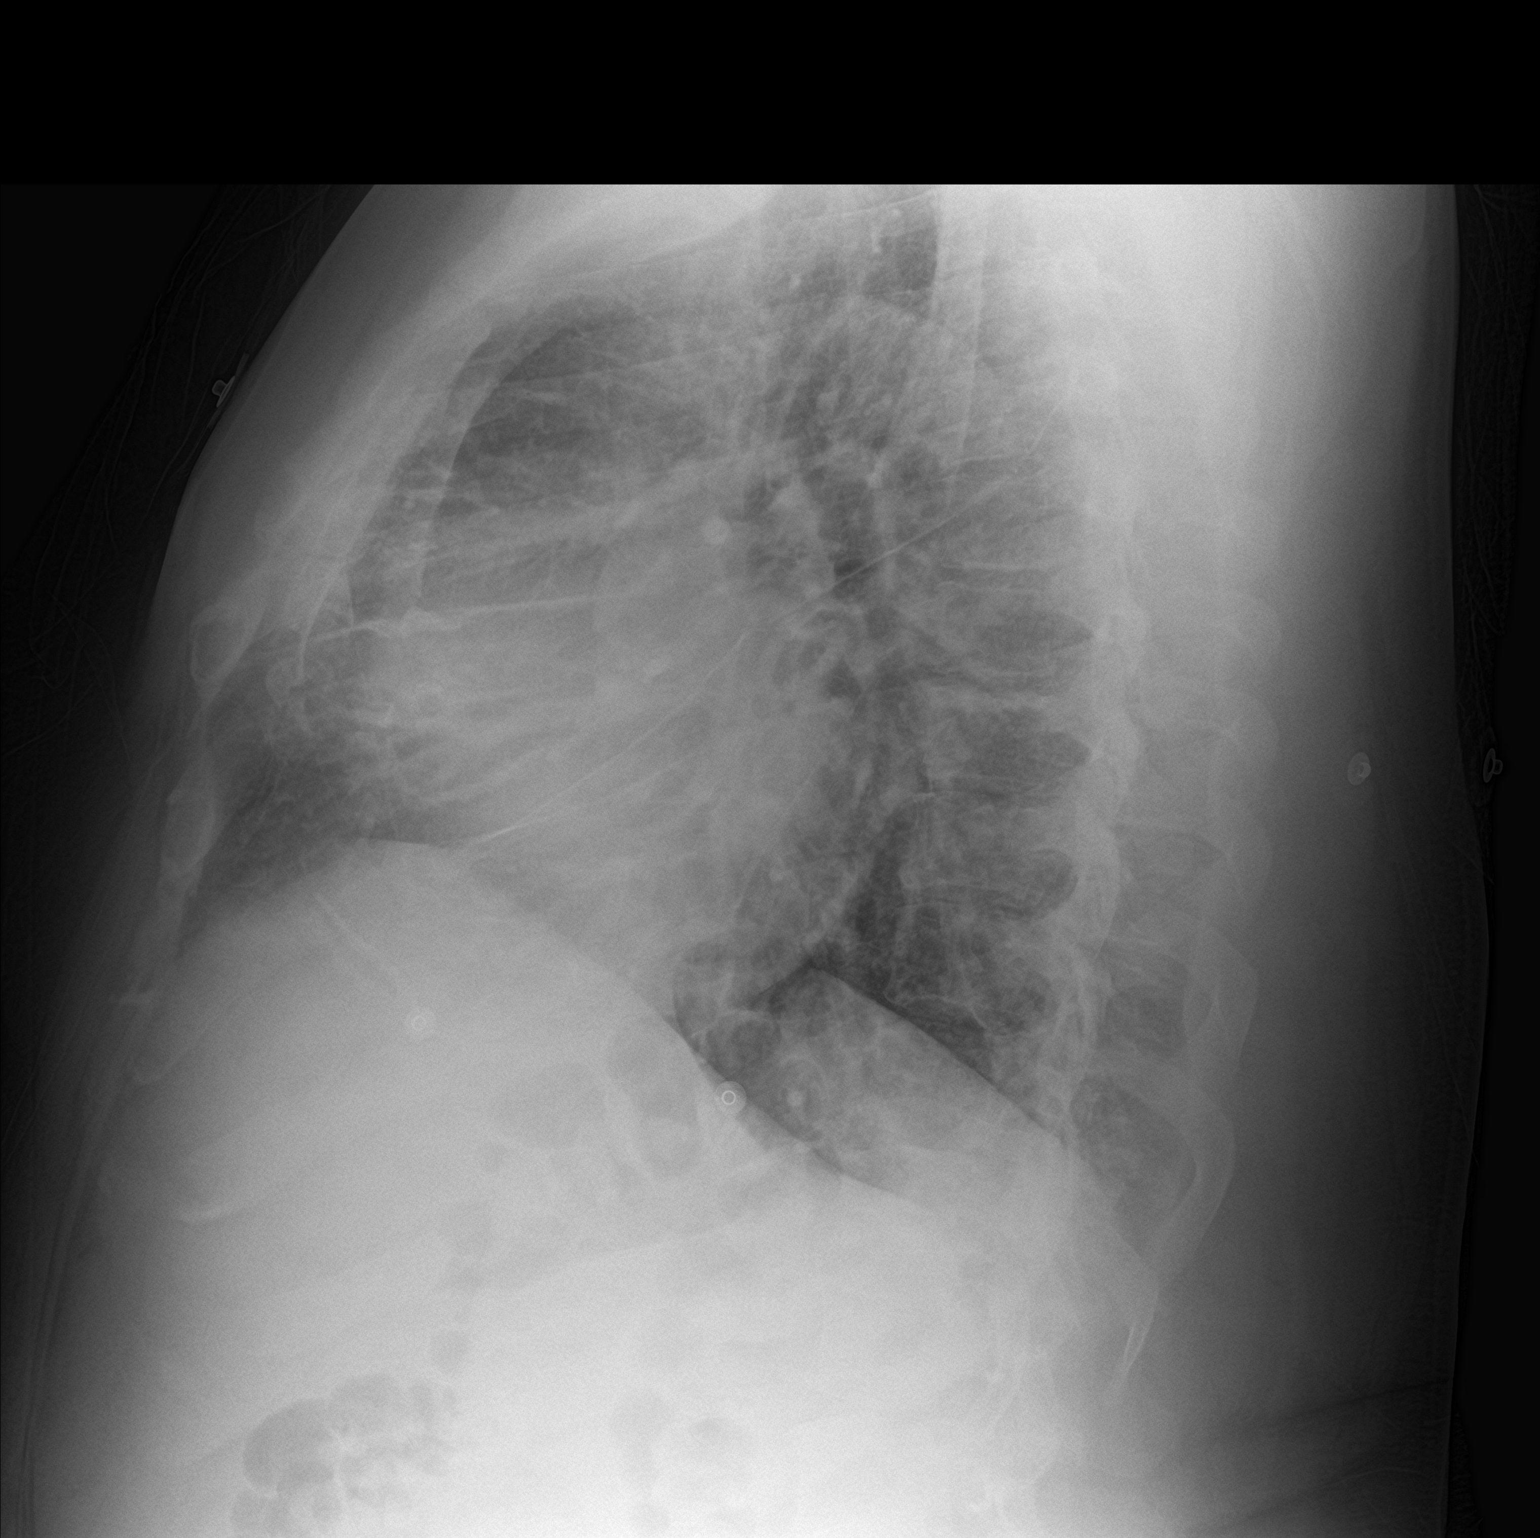

[2 of 2 positions shown; findings below may reference images not displayed]

FINDINGS: Cardiomegaly with pulmonary vascular prominence and bilateral
interstitial prominence noted consistent with congestive heart
failure. Interim improvement from prior exam. No pleural effusion or
pneumothorax.
IMPRESSION: Cardiomegaly with pulmonary vascular prominence and bilateral
interstitial prominence consistent with mild congestive heart
failure. Interim improvement from prior exam .

## 2016-12-31 IMAGING — CR DG CHEST 2V
2 series · 2 of 2 positions shown · non-contrast
Comparison: Prior radiograph from 05/31/2016.

CLINICAL DATA: Initial evaluation for acute shortness of breath.

EXAM:
CHEST  2 VIEW

[chest pa]
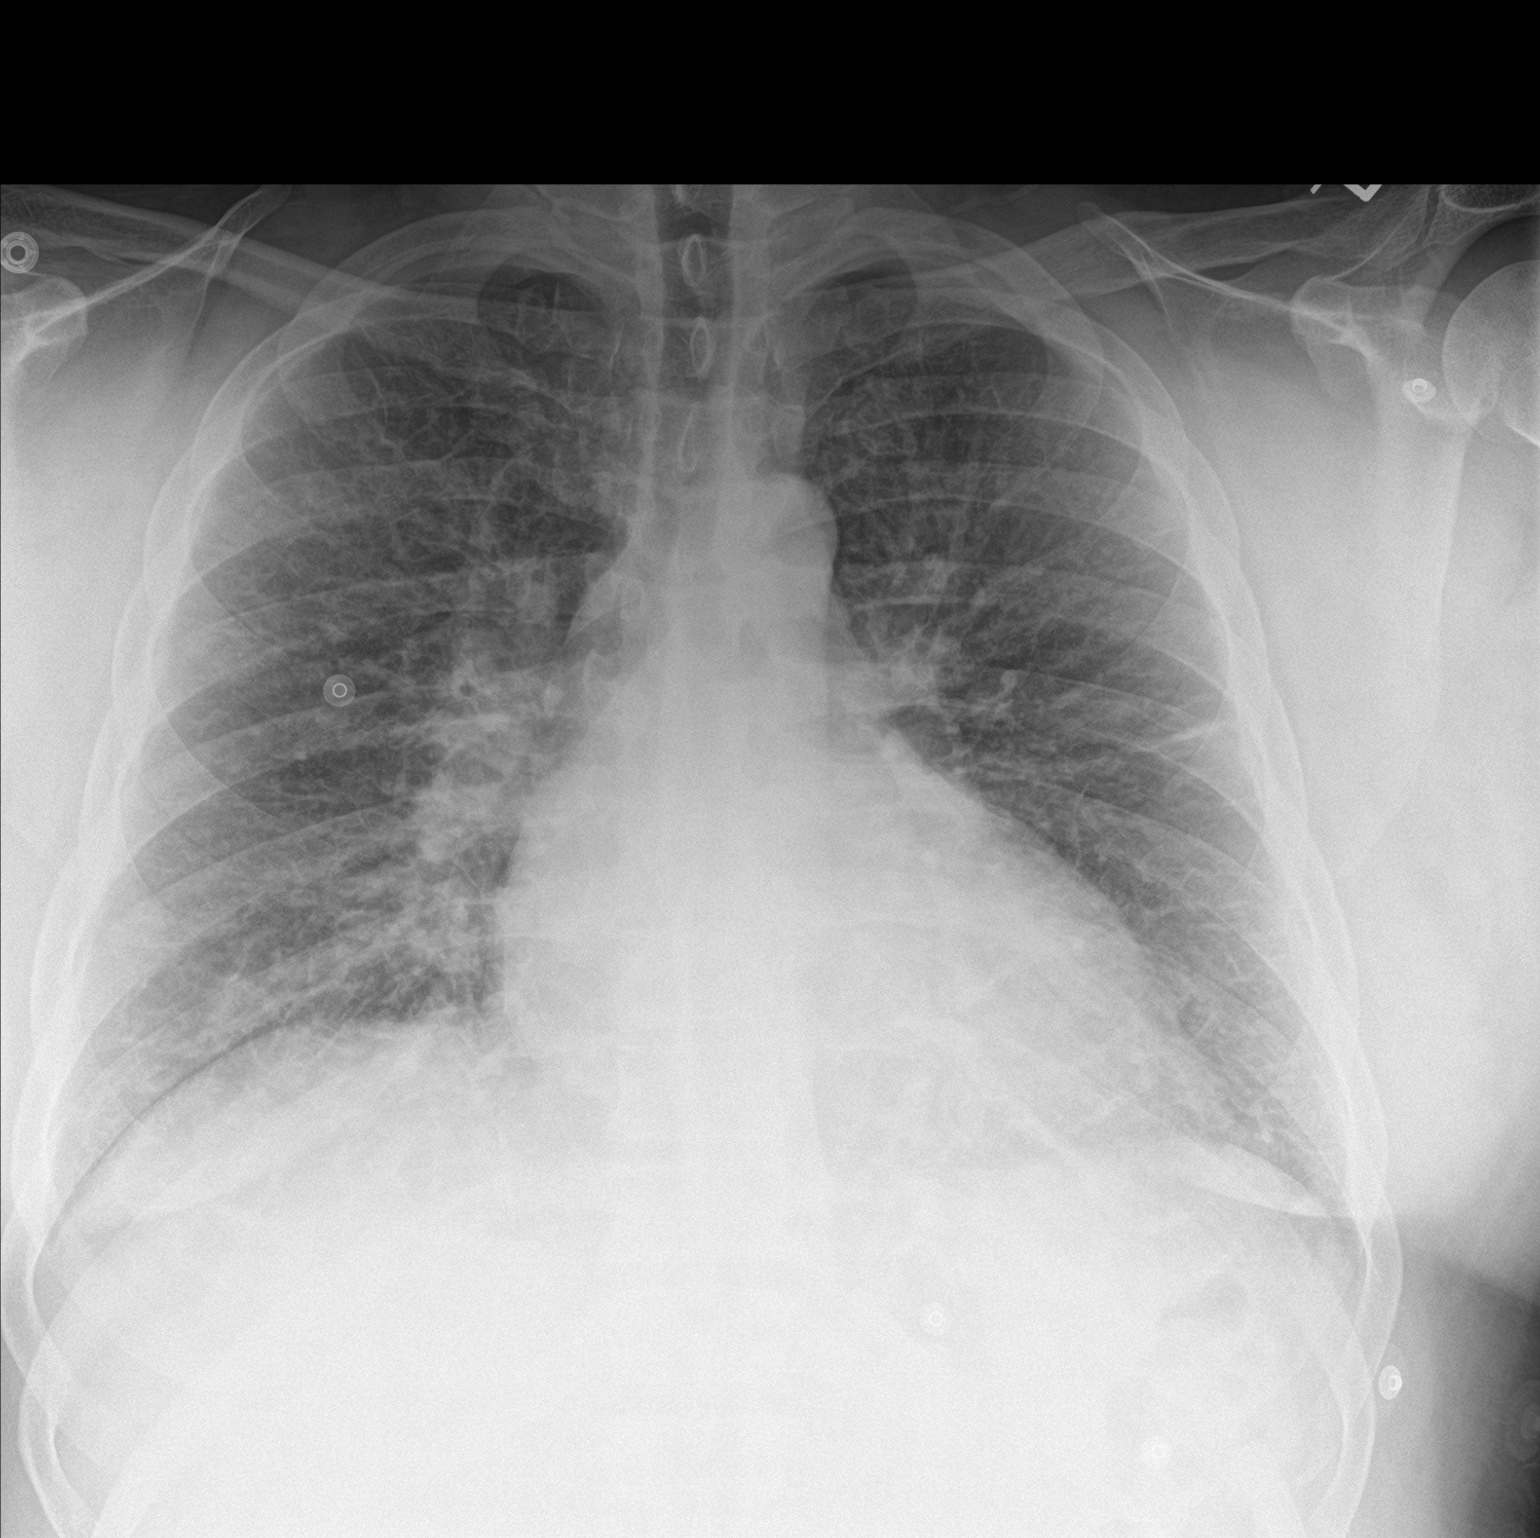

[chest lat]
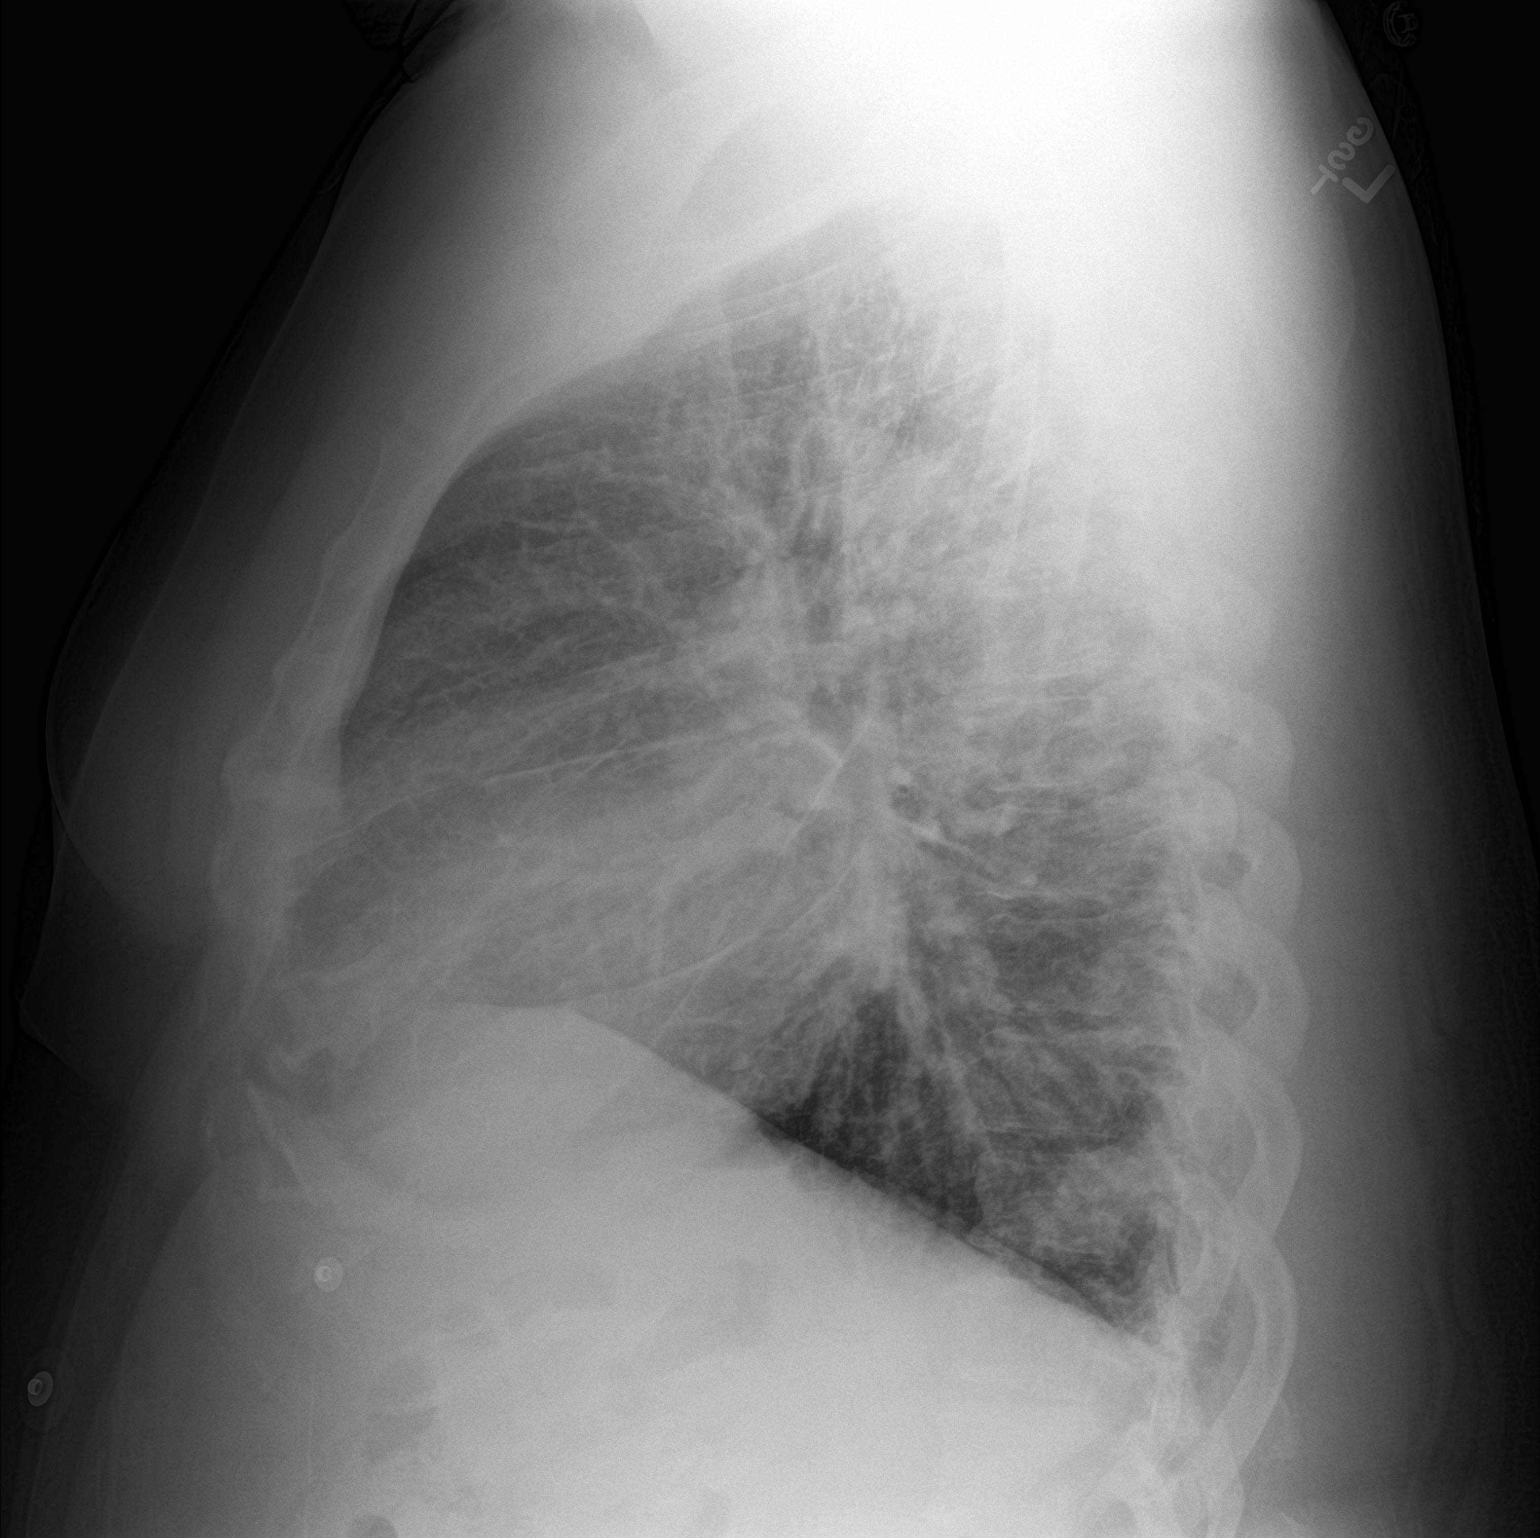

[2 of 2 positions shown; findings below may reference images not displayed]

FINDINGS: Cardiomegaly is stable. Mediastinal silhouette within normal limits.

Lungs normally inflated. Diffuse vascular congestion with
interstitial prominence and scattered Kerley B-lines, compatible
with pulmonary edema. No pleural effusion. No focal infiltrates. No
pneumothorax.

No acute osseous abnormality.
IMPRESSION: Cardiomegaly with diffuse vascular congestion and interstitial
prominence, compatible with moderate diffuse pulmonary edema.
Changes are worsened relative to recent radiograph from 05/31/2016.

## 2017-01-14 NOTE — Progress Notes (Signed)
  Progress Note   Date: 12/20/2016  Patient Name: Donald Brady        MRN#: [id number]   Clarification of the diagnosis of encephalopathy: Multifactorial due to Alcohol as well as drug intoxication.  - Alcoholic - Drug Induced/Toxic (specify drug)- Effexor, Wellbutrin, and carvedilol    Lucious Groves, DO

## 2017-02-09 ENCOUNTER — Encounter: Payer: Self-pay | Admitting: Internal Medicine

## 2017-07-11 IMAGING — DX DG CHEST 1V PORT
1 series · 1 of 1 positions shown · non-contrast
Comparison: 12/02/2016.

CLINICAL DATA: Overdose and ethanol intoxication.

EXAM:
PORTABLE CHEST 1 VIEW

[chest ap]
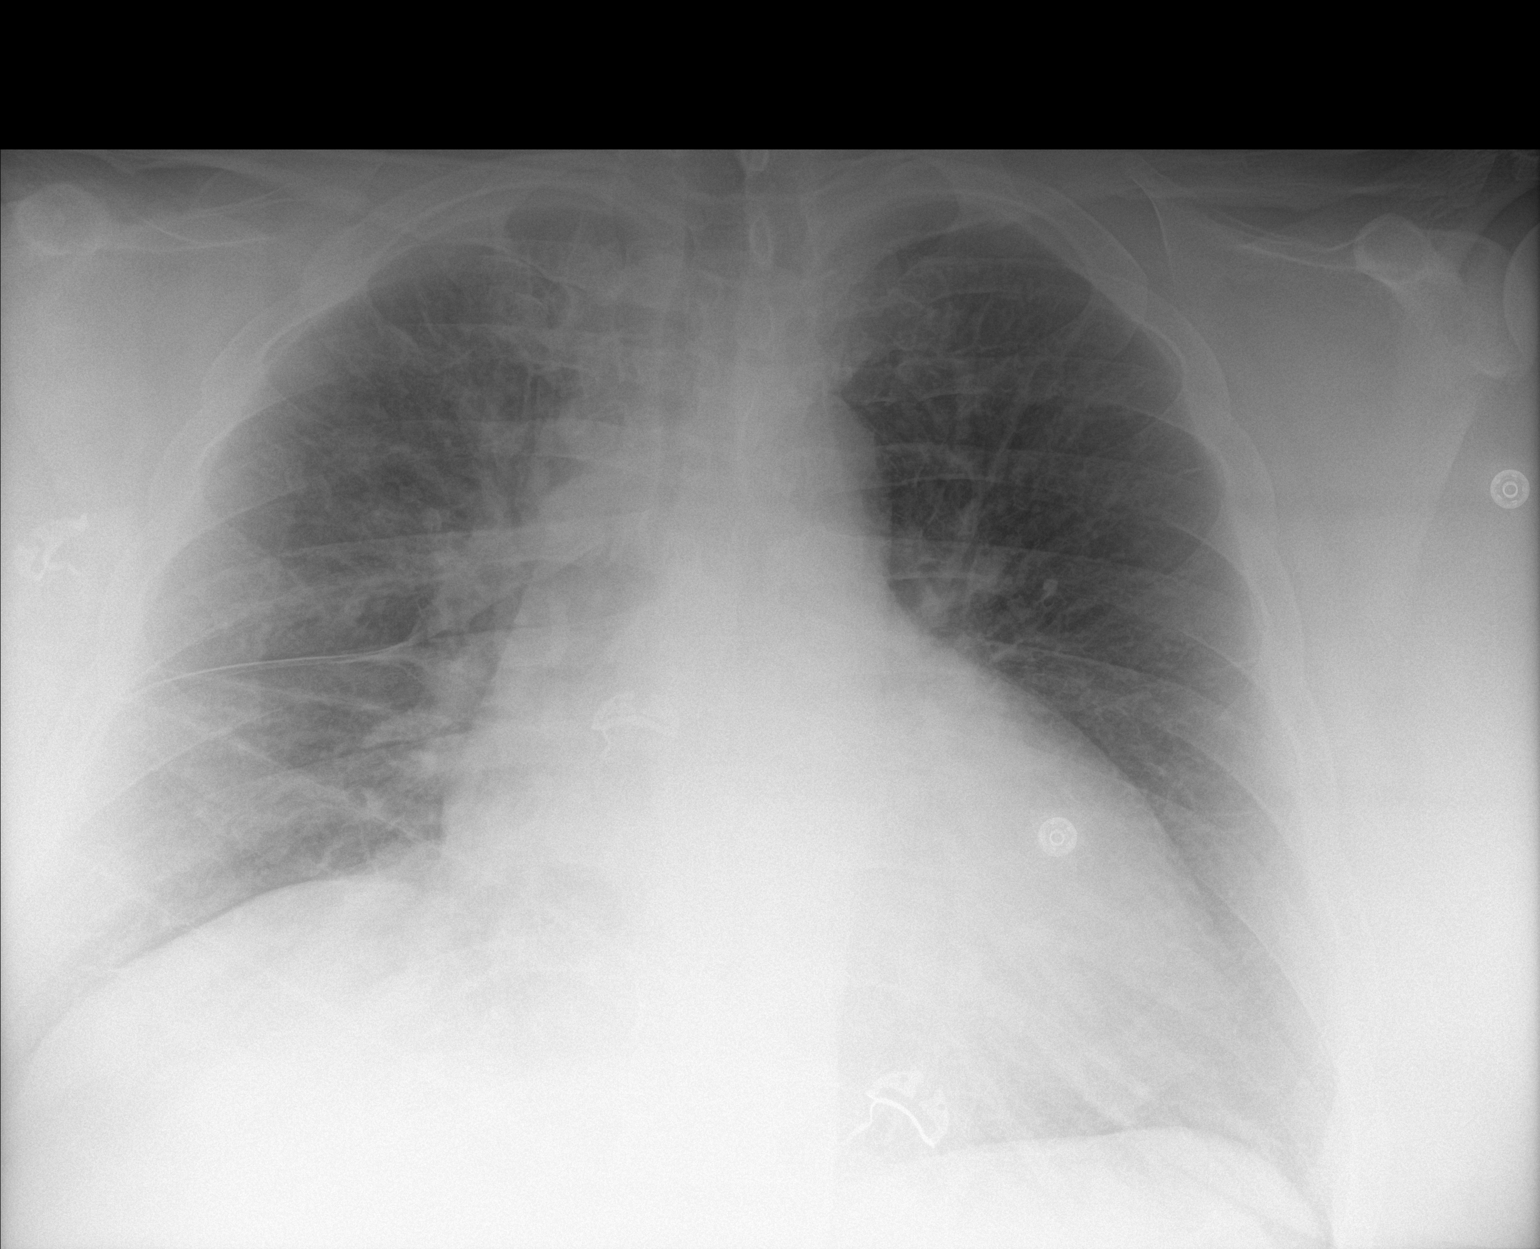

[1 of 1 positions shown; findings below may reference images not displayed]

FINDINGS: Cardiac enlargement. Mild vascular congestion. Low lung volumes.
Small amount of fluid is seen in the horizontal fissure. No
consolidation. No pneumothorax. Bones unremarkable. Worsening
aeration compared with priors.
IMPRESSION: Cardiomegaly with low lung volumes. Mild vascular congestion. No
consolidation or overt failure. Worsening aeration from priors.
